# Patient Record
Sex: Female | Born: 1955 | Race: White | Hispanic: No | Marital: Married | Smoking: Former smoker
Health system: Southern US, Community
[De-identification: ages and names within clinical notes are randomized; demographics above are authoritative.]

## PROBLEM LIST (undated history)

## (undated) DIAGNOSIS — I340 Nonrheumatic mitral (valve) insufficiency: Secondary | ICD-10-CM

## (undated) DIAGNOSIS — Z9289 Personal history of other medical treatment: Secondary | ICD-10-CM

## (undated) DIAGNOSIS — M1990 Unspecified osteoarthritis, unspecified site: Secondary | ICD-10-CM

## (undated) DIAGNOSIS — R51 Headache: Secondary | ICD-10-CM

## (undated) DIAGNOSIS — K7460 Unspecified cirrhosis of liver: Secondary | ICD-10-CM

## (undated) DIAGNOSIS — E785 Hyperlipidemia, unspecified: Secondary | ICD-10-CM

## (undated) DIAGNOSIS — I10 Essential (primary) hypertension: Secondary | ICD-10-CM

## (undated) DIAGNOSIS — J069 Acute upper respiratory infection, unspecified: Secondary | ICD-10-CM

## (undated) DIAGNOSIS — F419 Anxiety disorder, unspecified: Secondary | ICD-10-CM

## (undated) DIAGNOSIS — I5032 Chronic diastolic (congestive) heart failure: Secondary | ICD-10-CM

## (undated) DIAGNOSIS — I499 Cardiac arrhythmia, unspecified: Secondary | ICD-10-CM

## (undated) HISTORY — PX: KNEE ARTHROSCOPY: SUR90

## (undated) HISTORY — PX: EYE SURGERY: SHX253

## (undated) HISTORY — DX: Hyperlipidemia, unspecified: E78.5

## (undated) HISTORY — PX: TMJ ARTHROPLASTY: SHX1066

## (undated) HISTORY — PX: COCCYX REMOVAL: SHX600

## (undated) HISTORY — PX: ABDOMINAL HYSTERECTOMY: SHX81

## (undated) HISTORY — PX: CHOLECYSTECTOMY: SHX55

## (undated) HISTORY — PX: HEEL SPUR SURGERY: SHX665

---

## 1999-03-25 ENCOUNTER — Other Ambulatory Visit: Admission: RE | Admit: 1999-03-25 | Discharge: 1999-03-25 | Payer: Self-pay | Admitting: Obstetrics and Gynecology

## 2000-02-26 ENCOUNTER — Encounter: Payer: Self-pay | Admitting: Orthopedic Surgery

## 2000-02-26 ENCOUNTER — Encounter: Admission: RE | Admit: 2000-02-26 | Discharge: 2000-02-26 | Payer: Self-pay | Admitting: Orthopedic Surgery

## 2000-04-02 ENCOUNTER — Ambulatory Visit (HOSPITAL_COMMUNITY): Admission: RE | Admit: 2000-04-02 | Discharge: 2000-04-02 | Payer: Self-pay | Admitting: Neurosurgery

## 2000-04-02 ENCOUNTER — Encounter: Payer: Self-pay | Admitting: Neurosurgery

## 2000-08-13 ENCOUNTER — Encounter: Payer: Self-pay | Admitting: Orthopedic Surgery

## 2000-08-13 ENCOUNTER — Encounter: Admission: RE | Admit: 2000-08-13 | Discharge: 2000-08-13 | Payer: Self-pay | Admitting: Orthopedic Surgery

## 2000-08-26 ENCOUNTER — Encounter: Payer: Self-pay | Admitting: Orthopedic Surgery

## 2000-08-26 ENCOUNTER — Ambulatory Visit (HOSPITAL_COMMUNITY): Admission: RE | Admit: 2000-08-26 | Discharge: 2000-08-26 | Payer: Self-pay | Admitting: Orthopedic Surgery

## 2000-09-09 ENCOUNTER — Encounter: Payer: Self-pay | Admitting: Orthopedic Surgery

## 2000-09-09 ENCOUNTER — Ambulatory Visit (HOSPITAL_COMMUNITY): Admission: RE | Admit: 2000-09-09 | Discharge: 2000-09-09 | Payer: Self-pay | Admitting: Orthopedic Surgery

## 2000-09-23 ENCOUNTER — Ambulatory Visit (HOSPITAL_COMMUNITY): Admission: RE | Admit: 2000-09-23 | Discharge: 2000-09-23 | Payer: Self-pay | Admitting: Orthopedic Surgery

## 2000-09-23 ENCOUNTER — Encounter: Payer: Self-pay | Admitting: Orthopedic Surgery

## 2000-11-02 ENCOUNTER — Encounter: Payer: Self-pay | Admitting: Neurosurgery

## 2000-11-02 ENCOUNTER — Encounter: Admission: RE | Admit: 2000-11-02 | Discharge: 2000-11-02 | Payer: Self-pay | Admitting: Neurosurgery

## 2000-11-18 ENCOUNTER — Encounter: Payer: Self-pay | Admitting: Neurosurgery

## 2000-11-18 ENCOUNTER — Ambulatory Visit (HOSPITAL_COMMUNITY): Admission: RE | Admit: 2000-11-18 | Discharge: 2000-11-18 | Payer: Self-pay | Admitting: Neurosurgery

## 2000-12-02 ENCOUNTER — Encounter: Payer: Self-pay | Admitting: Obstetrics and Gynecology

## 2000-12-02 ENCOUNTER — Ambulatory Visit (HOSPITAL_COMMUNITY): Admission: RE | Admit: 2000-12-02 | Discharge: 2000-12-02 | Payer: Self-pay | Admitting: Obstetrics and Gynecology

## 2000-12-22 ENCOUNTER — Encounter: Admission: RE | Admit: 2000-12-22 | Discharge: 2000-12-22 | Payer: Self-pay | Admitting: Obstetrics and Gynecology

## 2000-12-22 ENCOUNTER — Encounter: Payer: Self-pay | Admitting: Obstetrics and Gynecology

## 2001-01-10 ENCOUNTER — Ambulatory Visit (HOSPITAL_COMMUNITY): Admission: RE | Admit: 2001-01-10 | Discharge: 2001-01-10 | Payer: Self-pay | Admitting: Obstetrics and Gynecology

## 2001-01-10 ENCOUNTER — Encounter: Payer: Self-pay | Admitting: Obstetrics and Gynecology

## 2001-01-11 ENCOUNTER — Other Ambulatory Visit: Admission: RE | Admit: 2001-01-11 | Discharge: 2001-01-11 | Payer: Self-pay | Admitting: Obstetrics and Gynecology

## 2001-11-25 ENCOUNTER — Encounter: Admission: RE | Admit: 2001-11-25 | Discharge: 2001-11-25 | Payer: Self-pay | Admitting: Specialist

## 2001-11-25 ENCOUNTER — Encounter: Payer: Self-pay | Admitting: Specialist

## 2001-12-01 ENCOUNTER — Encounter: Payer: Self-pay | Admitting: Obstetrics and Gynecology

## 2001-12-01 ENCOUNTER — Ambulatory Visit (HOSPITAL_COMMUNITY): Admission: RE | Admit: 2001-12-01 | Discharge: 2001-12-01 | Payer: Self-pay | Admitting: Obstetrics and Gynecology

## 2001-12-14 ENCOUNTER — Encounter: Payer: Self-pay | Admitting: Specialist

## 2001-12-20 ENCOUNTER — Inpatient Hospital Stay (HOSPITAL_COMMUNITY): Admission: RE | Admit: 2001-12-20 | Discharge: 2001-12-21 | Payer: Self-pay | Admitting: Specialist

## 2001-12-20 ENCOUNTER — Encounter (INDEPENDENT_AMBULATORY_CARE_PROVIDER_SITE_OTHER): Payer: Self-pay

## 2002-11-21 ENCOUNTER — Ambulatory Visit (HOSPITAL_COMMUNITY): Admission: RE | Admit: 2002-11-21 | Discharge: 2002-11-21 | Payer: Self-pay | Admitting: Internal Medicine

## 2002-11-21 ENCOUNTER — Encounter: Payer: Self-pay | Admitting: Internal Medicine

## 2003-04-10 ENCOUNTER — Ambulatory Visit (HOSPITAL_COMMUNITY): Admission: RE | Admit: 2003-04-10 | Discharge: 2003-04-10 | Payer: Self-pay | Admitting: Internal Medicine

## 2003-04-10 ENCOUNTER — Encounter: Payer: Self-pay | Admitting: Internal Medicine

## 2003-05-17 ENCOUNTER — Encounter
Admission: RE | Admit: 2003-05-17 | Discharge: 2003-08-15 | Payer: Self-pay | Admitting: Physical Medicine & Rehabilitation

## 2003-08-14 ENCOUNTER — Encounter
Admission: RE | Admit: 2003-08-14 | Discharge: 2003-11-12 | Payer: Self-pay | Admitting: Physical Medicine & Rehabilitation

## 2003-10-05 ENCOUNTER — Emergency Department (HOSPITAL_COMMUNITY): Admission: EM | Admit: 2003-10-05 | Discharge: 2003-10-05 | Payer: Self-pay | Admitting: Family Medicine

## 2003-11-28 ENCOUNTER — Encounter
Admission: RE | Admit: 2003-11-28 | Discharge: 2004-02-26 | Payer: Self-pay | Admitting: Physical Medicine & Rehabilitation

## 2003-11-29 ENCOUNTER — Ambulatory Visit (HOSPITAL_COMMUNITY): Admission: RE | Admit: 2003-11-29 | Discharge: 2003-11-29 | Payer: Self-pay | Admitting: Family Medicine

## 2003-12-04 ENCOUNTER — Ambulatory Visit (HOSPITAL_COMMUNITY)
Admission: RE | Admit: 2003-12-04 | Discharge: 2003-12-04 | Payer: Self-pay | Admitting: Physical Medicine & Rehabilitation

## 2009-04-05 ENCOUNTER — Inpatient Hospital Stay: Payer: Self-pay | Admitting: Internal Medicine

## 2009-04-28 ENCOUNTER — Emergency Department (HOSPITAL_COMMUNITY): Admission: EM | Admit: 2009-04-28 | Discharge: 2009-04-28 | Payer: Self-pay | Admitting: Family Medicine

## 2009-07-25 ENCOUNTER — Emergency Department (HOSPITAL_COMMUNITY): Admission: EM | Admit: 2009-07-25 | Discharge: 2009-07-25 | Payer: Self-pay | Admitting: Family Medicine

## 2009-12-11 ENCOUNTER — Ambulatory Visit: Payer: Self-pay | Admitting: Anesthesiology

## 2010-06-03 ENCOUNTER — Inpatient Hospital Stay (HOSPITAL_COMMUNITY)
Admission: EM | Admit: 2010-06-03 | Discharge: 2010-06-07 | Payer: Self-pay | Source: Home / Self Care | Admitting: Emergency Medicine

## 2010-06-06 ENCOUNTER — Encounter (INDEPENDENT_AMBULATORY_CARE_PROVIDER_SITE_OTHER): Payer: Self-pay

## 2010-07-06 HISTORY — PX: JOINT REPLACEMENT: SHX530

## 2010-07-27 ENCOUNTER — Encounter: Payer: Self-pay | Admitting: Physical Medicine & Rehabilitation

## 2010-09-16 LAB — URINE CULTURE: Culture: NO GROWTH

## 2010-09-16 LAB — URINALYSIS, ROUTINE W REFLEX MICROSCOPIC
Bilirubin Urine: NEGATIVE
Ketones, ur: 15 mg/dL — AB
Nitrite: NEGATIVE
Protein, ur: NEGATIVE mg/dL
Specific Gravity, Urine: >1.03 — ABNORMAL HIGH (ref 1.005–1.030)
Urobilinogen, UA: 0.2 mg/dL (ref 0.0–1.0)

## 2010-09-16 LAB — CBC
HCT: 44.3 % (ref 36.0–46.0)
Hemoglobin: 12.2 g/dL (ref 12.0–15.0)
Hemoglobin: 14.4 g/dL (ref 12.0–15.0)
MCH: 32.5 pg (ref 26.0–34.0)
MCV: 101.9 fL — ABNORMAL HIGH (ref 78.0–100.0)
RBC: 3.75 MIL/uL — ABNORMAL LOW (ref 3.87–5.11)
RBC: 4.36 MIL/uL (ref 3.87–5.11)
WBC: 10.4 10*3/uL (ref 4.0–10.5)

## 2010-09-16 LAB — COMPREHENSIVE METABOLIC PANEL
ALT: 17 U/L (ref 0–35)
AST: 22 U/L (ref 0–37)
AST: 25 U/L (ref 0–37)
Alkaline Phosphatase: 106 U/L (ref 39–117)
Alkaline Phosphatase: 133 U/L — ABNORMAL HIGH (ref 39–117)
CO2: 28 mEq/L (ref 19–32)
CO2: 23 mEq/L (ref 19–32)
Chloride: 109 mEq/L (ref 96–112)
Chloride: 106 mEq/L (ref 96–112)
Creatinine, Ser: 0.59 mg/dL (ref 0.4–1.2)
GFR calc Af Amer: >60 mL/min (ref >60)
GFR calc Af Amer: >60 mL/min (ref >60)
GFR calc non Af Amer: >60 mL/min (ref >60)
GFR calc non Af Amer: >60 mL/min (ref >60)
Glucose, Bld: 84 mg/dL (ref 70–99)
Potassium: 3.9 mEq/L (ref 3.5–5.1)
Potassium: 4.4 mEq/L (ref 3.5–5.1)
Sodium: 137 mEq/L (ref 135–145)
Total Bilirubin: 0.4 mg/dL (ref 0.3–1.2)

## 2010-09-16 LAB — DIFFERENTIAL
Basophils Relative: 0 % (ref 0–1)
Eosinophils Absolute: 0.1 10*3/uL (ref 0.0–0.7)
Eosinophils Relative: 1 % (ref 0–5)
Lymphs Abs: 2.7 10*3/uL (ref 0.7–4.0)
Neutrophils Relative %: 65 % (ref 43–77)

## 2010-09-16 LAB — LIPASE, BLOOD: Lipase: 34 U/L (ref 11–59)

## 2010-11-21 NOTE — H&P (Signed)
[facility]  Patient:    Leah Shah, Leah Shah Visit Number: [id number] MRN: [id number]          Service Type: Attending:  Kerrin Champagne, M.D. Dictated by:   Sammuel Cooper. Mahar, P.A. Adm. Date:  12/20/01                           History and Physical  DATE OF BIRTH:  Nov 17, 1955  CHIEF COMPLAINT:  Coccyx pain.  HISTORY OF PRESENT ILLNESS:  The patient is a 55 year old female with a distant history of coccyx fracture.  She has developed arthritis in that area per MRI.  She has been having continued pain and dysfunction secondary to this arthritis.  She has failed conservative management.  The risks and benefits of the proposed surgery were discussed with the patient by Dr. Vira Browns secondary to it interfering with her activities of daily living, quality of life, and failing conservative management.  It was thought this would be her best course of management.  She indicated understanding and wished to proceed.  ALLERGIES:  CODEINE causes a rash.  MEDICATIONS: 1. Trazodone 150 mg p.o. q.h.s. 2. Atenolol 100 mg p.o. q.h.s. 3. Hydroxyzine 25 mg p.o. qhs 4. Darvocet q.4h. p.r.n.  PAST MEDICAL HISTORY: 1. Migraines. 2. Anxiety.  PAST SURGICAL HISTORY: 1. Partial hysterectomy in 1991. 2. TMJ surgery in 1994, 1995, and 1996.  SOCIAL HISTORY:  The patient is married.  Unemployed.  She smokes two packs of cigarettes per day.  She denies alcohol use.  She will have her husband and 15 year old son to help her through her postoperative course.  FAMILY HISTORY:  Father alive at age 27, with rheumatoid arthritis and emphysema.  Mother alive; however, her history is unknown.  The patient thinks her mom may have "heart problems."  REVIEW OF SYSTEMS:  The patient denies any fevers, chills, sweats, or bleeding tendencies.  CNS:  Denies any blurred vision, double vision, headaches, seizure, or paralysis.  CARDIOVASCULAR:  Denies any chest pain,  angina, orthopnea, claudication, or palpitations.  PULMONARY:  Denies any shortness of breath, productive cough, or hemoptysis.  GASTROINTESTINAL:  Denies any nausea, vomiting, constipation, diarrhea, melena, or bloody stool. GENITOURINARY:  Denies any dysuria, hematuria, or discharge.  MUSCULOSKELETAL: Positive paresthesias to bilateral lower extremities.  No numbness and no paralysis.  PHYSICAL EXAMINATION:  VITAL SIGNS:  Blood pressure 108/66, respirations 16 and unlabored, pulse 64 and regular.  GENERAL:  The patient is a 55 year old white female who is alert and oriented, in no acute distress.  Well-nourished, well-groomed, appears her stated age. Is sitting rather uncomfortably on a chair in the examination room.  HEENT:  Head normocephalic, atraumatic.  Pupils equal, round, and reactive to light.  Extraocular movements intact.  Nares patent.  Pharynx is clear.  NECK:  Soft to palpation.  No bruits appreciated.  No lymphadenopathy noted. No thyromegaly noted.  CHEST:  Clear to auscultation.  No rales, rhonchi, stridor, wheezes, or friction rubs.  BREASTS:  Not pertinent, not performed.  HEART:  Distant heart sounds.  S1, S2.  Regular rate and rhythm.  No murmurs, gallops, or rubs noted.  ABDOMEN:  Obese.  Positive bowel sounds.  Nontender, nondistended.  No organomegaly noted.  GENITOURINARY:  Not pertinent, not performed.  EXTREMITIES:  Sensation is intact grossly to light touch.  Motor function is intact throughout bilateral lower extremities.  Posterior tibialis pulses are intact and equal bilaterally.  Per Dr. Rogelia Mire  note, heel- and toe-walking demonstrate weakness.  Reflexes at the knee are 2+ and symmetric, ankle at 2+ and symmetric.  Motor shows no deficit.  Tender along the region of the lower sacrum and coccyx and along the left side of her sacral region as well.  SKIN:  Intact.  Without any lesions or rashes.  LABORATORY DATA:  X-rays show an angulated  coccyx at C2-3 level.  There is an almost 90-degree angle present.  Radiographs obtained suggest that there is some increased bone formation at the level of the sacrococcygeal junction with a transverse area of increased bone density.  IMPRESSION:  Sacrococcygeal arthritis.  PLAN: 1. Admit to [facility] for coccygealectomy at the sacrococcygeal    junction on December 20, 2001, to be done by Dr. Vira Browns. 2. The patients gynecologist is Dr. Huel Cote, and she has sent [place] a    note saying that from her standpoint the patient is stable and able to    proceed with the surgery. 3. The patients primary care physician is Dr. Dewain Penning. Dictated by:   Sammuel Cooper. Mahar, P.A. Attending:  Kerrin Champagne, M.D. DD:  12/14/01 TD:  12/16/01 Job: [id number]

## 2010-11-21 NOTE — Procedures (Signed)
NAME:  Leah Shah, Leah Shah                           ACCOUNT NO.:  [id number]   MEDICAL RECORD NO.:  [id number]                   PATIENT TYPE:  REC   LOCATION:  [facility]                                  FACILITY:  [facility]   PHYSICIAN:  Erick Colace, M.D.           DATE OF BIRTH:  [birth year]   DATE OF PROCEDURE:  DATE OF DISCHARGE:                                 OPERATIVE REPORT   HISTORY OF PRESENT ILLNESS:  [name] has had bilateral __________ joint  injections August 23, 2003 without significant improvement.   INDICATIONS FOR PROCEDURE:  Axial as well as left lower extremity pain going  down as far as the foot.   PROCEDURE:  Left L1 transforaminal epidural steroid injection.   DESCRIPTION OF PROCEDURE:  Informed consent was obtained after describing  the risks and benefits of the procedure with the patient. These risks  included paralysis, loss of bowel and bladder function, bruising, bleeding  and infection. The patient wishes to proceed and has given written consent.  The patient placed prone on the fluoroscopy table with Betadine prep and  sterile drape. Skin and subcutaneous tissues were anesthetized with 3 cc of  1% Lidocaine with a 25 gauge 1and 1/4 inch needle. A 22 gauge 3.5 inch  spinal needle was manipulated in his left S1 foramen under fluoroscopic  guidance and confirmed with Omnipaque 180 x 1/2 cc under fluoroscopy, which  demonstrated no inter-  vascular uptake and appropriate epidural spread. The patient tolerated the  procedure well. Please refer to procedure record for pre and post vital  signs, which did remain stable. Post injection instructions given. She is to  followup in 3 weeks.                                                Erick Colace, M.D.    AEK/MEDQ  D:  09/13/2003 17:16:01  T:  09/13/2003 04:54:09  Job:  [id number]

## 2010-11-21 NOTE — Assessment & Plan Note (Signed)
REASON FOR VISIT:  A 55 year old female referred by [facility] for low back  pain, buttock pain, as well as bilateral leg pain.  The last time I saw her  was for an EMG done on July 19, 2003 to evaluate paresthesias and  dysesthesias in the lower extremities.  This was negative.  We ordered sed  rate, ANA, and rheumatoid factor because of complaints of painful swelling  in the legs, particularly on the left side.  These were normal.   She has been on Darvocet-N 100 one p.o. q.i.d. providing some pain relief  for her.  She is also on trazodone 150 q.h.s., atenolol 100 mg p.o. daily,  and hydroxyzine 25 p.o. q.h.s.   FAMILY HISTORY:  Significant for rheumatoid arthritis.   INTERVAL HISTORY:  Pain left shoulder about 3 weeks, no trauma.  She has a  skin area over that region.  No previous injury or surgeries.  She has some  neck pain but this is fairly minor at this point.  Her pain is graded at  9/10, ranging from 8-10.  Pain diagram showing left shoulder pain, low back  pain, bilateral lower extremity pain mainly anterior.   ALLERGIES:  CODEINE.   REVIEW OF SYSTEMS:  Swelling in the legs, abnormal heart rhythm, depression,  weight gain, swelling.   EXAMINATION:  Blood pressure 128/77, pulse 62, respirations 16, O2  saturation 98% in room air.  Gait is normal.  Affect is alert, appearance  normal.   Sensation reduced in the left S1 dermatome but more generally over the  entire right side compared to the left side.  She has no evidence of facial  droop.  She has normal motor strength although somewhat of a give-way type  weakness.  Examination of bilateral upper and lower extremities graded at  least at 4/5.  She has normal neck range of motion.  Her back range of  motion approximately forward flexion 50%, extension 505, lateral rotation  and bending 50%.  Faber's test is positive SI joint area bilaterally.  She  has normal deep tendon reflexes with the exception of left S1.   Skin shows one papule left deltoid area.  No pus, no induration.   Range of motion bilateral upper extremities is full.  However, internal  rotation of the left side at the shoulders is mildly painful.   IMPRESSION:  1. Chronic low back pain, mainly axial, although she does have some lower     extremity pain as well, normal EMG.  Exam suggests sacroiliac joint     involvement.  2. Question left S1 dermatome radicular symptoms.  3. Cervicalgia, mild.  4. Left shoulder pain, likely rotator cuff tendonitis.   PLAN:  1. Will set up for bilateral SI joint injections.  2. Continue Darvocet.  3. Consider S1 selective nerve root injection if the SI joint injection is     negative.      Erick Colace, M.D.   AEK/MedQ  D:  08/16/2003 13:44:40  T:  08/16/2003 15:29:33  Job #:  [id number]   cc:   Kerrin Champagne, M.D.  [address]  [place]  [place] [address]  Fax: [phone]   Dr. Margarette Canada at [facility]

## 2010-11-21 NOTE — Procedures (Signed)
NAME:  Leah Shah, Leah Shah                           ACCOUNT NO.:  [id number]   MEDICAL RECORD NO.:  [id number]                   PATIENT TYPE:  REC   LOCATION:  [facility]                                  FACILITY:  [facility]   PHYSICIAN:  Erick Colace, M.D.           DATE OF BIRTH:  1956-05-27   DATE OF PROCEDURE:  08/23/2003  DATE OF DISCHARGE:                                 OPERATIVE REPORT   PROCEDURE:  Bilateral sacroiliac joint injections.   INDICATIONS FOR PROCEDURE:  Axial back pain and buttock pain, positive  Fabre's test bilaterally in sacroiliac joint.  The patient has had 8 out of  10 despite Darvocet.   DESCRIPTION OF PROCEDURE:  Informed consent was obtained after describing  the risks and benefits of the procedure to the patient.  These risks  included paralysis, loss of bowel and bladder function, bruising, bleeding  and infection.  The patient wishes to proceed.   The patient was placed prone on the fluoroscopy table with Betadine prep and  sterile drape.  The skin and subcutaneous tissues were anesthetized with 2  mL of 1% lidocaine on each side with a 25 gauge 1 1/4 needle.  A 22 gauge,  three and a half inch, spinal needle was manipulated into the left  sacroiliac joint under fluoroscopic guidance and confirmed with Omnipaque  180 times one half mL under live fluoroscopy demonstrating no intravascular  uptake and appropriate interarticular spread.  Then the same procedure was  repeated exactly on the right side.  In each case, 1.5 mL of 1% lidocaine  methylparaben free were injected as a diagnostic block.  The patient  tolerated the procedure well.  Post-injection pain score was 6 out of 10.   The patient will return in three weeks and if she has a positive diagnostic  block would then add corticosteroid.  If negative, then consider S1  selective nerve root injection or transforaminal epidural steroid injection  left versus bilateral.                      Erick Colace, M.D.    AEK/MEDQ  D:  08/23/2003 15:40:40  T:  08/23/2003 16:06:15  Job:  [id number]   cc:   DR, Margarette Canada  [facility]   Kerrin Champagne, M.D.  [address]  [place]  [place] [address]  Fax: [phone]

## 2010-11-21 NOTE — Assessment & Plan Note (Signed)
MEDICAL RECORD NUMBER:  [id number]   DATE OF BIRTH:  09-Jul-1955   HISTORY:  Leah Shah returns today after I last saw her on Nov 29, 2003.  She  has a history of chronic low back pain since year 2000 since coccygeal  removal per Dr. Vassie Moselle.  She has had EMG, NCV at this office showing no  evidence of peripheral neuropathy or radiculopathy.  She has had right S1  selective nerve root injection without any particular improvement in her  pain.  She had bilateral sacroiliac joint injections which were done on  August 23, 2003 which did not result in significant pain relief.  Most  recently she had a fall on a wooden floor and landed on her right knee.  She  had mild to moderate medial greater lateral joint line tenderness at that  time and ability to flex the knee actually to 45 degrees, no popping or  clicking.  She was sent for an x-ray of her right knee and this showed  essentially mild DJD, mild medial compartmental narrowing, patella looked  okay.  She was also sent for a CT of the lumbar spine given that she had  subjective lower extremity weakness.  There was no evidence of a herniated  disk, no foraminal or central canal stenosis from T12 though S1, just some  minimal bilateral facet degenerative changes L4-5, L5-S1.   MEDICATIONS:  1. Darvocet N 100 1 p.o. q.i.d. which helps her back pain.  2. Trazodone 150 q.h.s.  3. Atenolol 100 mg p.o. daily.  4. Hydroxizine 25 q.h.s.  5. Benicar 40 p.o. daily.  6. Flexeril 5 t.i.d.   REVIEW OF SYSTEMS:  Positive for swelling in the right foot.  Some  depression, anxiety, weakness, numbness, headaches.  Frequent urination.  She has stress incontinence symptoms.  Weight gain.   EXAMINATION:  Blood pressure 102/62, pulse 66, O2 saturation 95% in room  air.   She has reduced right L5-S1 dermatome to light touch.  She has full hip  range of motion without pain.  She has no tenderness over the greater  trochanters, does have tenderness  at the lumbosacral junction but not over  PSIS area.  She has right prepatellar bursitis with some ecchymosis however  at that area.  She has no medial lateral joint line tenderness, no valgus or  varus laxity at the knee.  Lachman's could not be done secondary to pain  over the knee.  She does have swelling at the right ankle but no pain with  range of motion, and no evidence of ecchymosis.  No point tenderness in the  foot or ankle region.   IMPRESSION:  1. Right prepatellar bursitis status post fall.  Question if she might have     some meniscal injury.  2. Lumbar facet arthropathy, chronic low back pain. She has no evidence of     stenosis or other neural compressive lesion.   RECOMMENDATIONS:  1. Will have the patient follow up with Dr. Madelon Lips, he has seen her in the     past in regards to her right knee pain.  2. Will start her on Naprosyn 500 b.i.d. x1 month.  3. Continue Darvocet N 100 1 p.o. q.i.d., I think she can be on this     chronically.  4. I do not think any further spine interventional procedures are needed at     this time so that I will see her on a p.r.n. basis and Dr.  Collins Scotland can     continue the Darvocet N 100.      Erick Colace, M.D.   AEK/MedQ  D:  12/13/2003 16:40:20  T:  12/13/2003 20:11:03  Job #:  [id number]   cc:   Tammy R. Collins [place], M.D.  [address]  [place]  [place] [address]  Fax: [phone]   Thera Flake., M.D.  [address] [place]  [place] [address]  Fax: [phone]

## 2010-11-21 NOTE — Assessment & Plan Note (Signed)
MEDICAL RECORD NUMBER:  [id number].   [name] returns today after I last saw her on September 13, 2003. At that  time, I did a left SI transforaminal epidural steroid injection which helped  her back pain actually more than her left lower extremity pain. The duration  of action was approximately three weeks. Her prior injection was bilateral  sacroiliac joint injection done for axial back pain, and this is diagnostic  of lidocaine only and did not provide any pain relief.   In the interval time, yesterday she fell at home on a wooden floor, tried to  grab onto a chair as well as vacuum cleaner fell onto her right knee, has  mild abrasion right lower anterior leg but has a lot of pain with attempted  knee flexion. She is able to walk on it but keeps her knee straight.   Prior workup includes sed rate, INR, rheumatoid factor because of complaints  of painful swelling. There were normal.   MEDICATIONS:  1. Darvocet-N 100 one p.o. q.i.d.  2. Trazodone 150 mg q.h.s.  3. Atenolol 100 p.o. q.d.  4. Hydroxyzine 25 q.h.s.  5. Just started on Benicar 40 mg p.o. q.d.  6. Also was started on Flexeril 5 t.i.d. since last visit.   ALLERGIES:  CODEINE.   REVIEW OF SYSTEMS:  Positive for abnormal heart rhythm, blurred vision,  anxiety, depression, headaches, weakness, swelling, as well as frequent  urination and occasion incontinence. The incontinence started the end of  March.   PHYSICAL EXAMINATION:  VITAL SIGNS:  Blood pressure 122/81, pulse oximetry  62, O2 saturation 97% on room air.  GENERAL:  No acute distress. Mood and affect appropriate.  EXTREMITIES:  Right knee has some swelling in the prepatellar area,  significant tenderness with palpation around the patella, would not allow me  to do deeper palpation due to pain. She has no ecchymosis over that area. No  significant knee effusion intra-particularly. Mild to moderate medial  greater than lateral joint line tenderness. She is able to  flex the knee  actively to 45 degrees. No clicking/popping heard. She has good range of  motion at the ankle without pain. She has some mild tenderness to palpation  over the anterior leg near the contused and abraded area. Left knee:  No  pain. No pain with ankle range of motion.  BACK:  Mild tenderness to palpation of the lumbar spine. Sensation is normal  to pinprick and light touch bilaterally.   IMPRESSION:  1. Right knee contusion, rule out fracture.  2. History of lumbar radiculopathy and lumbar pain.  3. Lower extremity weakness, subjective, difficulty to test secondary to     pain today. Will rule out any type of lumbar compressive lesion. Prior     MRI done approximately three years ago showing no disk herniation. We     will just get a CT of the lumbar spine. Further history on urinary     symptoms sound more like stress incontinence.   PLAN:  1. I will see her back in two to three weeks, check a knee x-ray as well. If     positive, refer to Dr. Madelon Lips, has seen in     the past before.  2. Consider bilateral S1 transforaminal epidural injections depending on     results of the CT lumbar spine.      Leah Shah, M.D.   AEK/MedQ  D:  11/29/2003 54:09:81  T:  11/29/2003 15:23:31  Job #:  [id number]   cc:   Marchelle Gearing., M.D.  [address] [place]  [place] [address]  Fax: [phone]

## 2010-11-21 NOTE — Group Therapy Note (Signed)
HISTORY OF PRESENT ILLNESS:  This is a 55 year old female referred by [facility] and after she was first referred to Dr. Vear Clock' office because her  funding source was not able to make an appointment with him.  The patient  gives a history of falling at work in the year 2000, slipped and landing on  her buttocks.  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  She had coccyx  pain since that time and first underwent conservative treatment but  eventually in June 2003 had coccygeal removal per Dr. Otelia Sergeant.  Since that  time, she has had some improvement in her buttocks pain but continues to  have low back pain.  She had some physical therapy prior to her surgery in  June 2003, has not had any since that time.  Has at an intervening time  developed left greater than right knee pain.  Has some office notes from  [facility] indicating negative knee x-rays.  A note that they were going  to check sed-rate and rheumatoid factor.  Per patient, states that  rheumatoid factor was normal, but there may have been some elevation of sed-  rate.  She has been taking Darvocet N-100 one p.o. q.i.d. per Dr. Margarette Canada.  She states that this help relieves her pain.  She does not have films  available for my review.  However, from each chart, there was a foot x-ray  on the right with mild bunion formation versus metatarsal, minimal spurring  lateral aspect of the calcaneus.  She had two views of the knee, left side  showing no joint space narrowing.  There is no evidence of effusion on that  exam.  Chest x-ray showed some COPD changes December 14, 2001.  Transvaginal  ultrasound, small right ovarian cyst and two left simple ovarian cysts.  CT  of the pelvis Nov 25, 2001, showed no fracture of the sacrum or pelvis.  Multi-plantar reconstruction was technically limited.  Once again, no  fractures identified.  MRI of the lumbar spine dated Nov 18, 2000, per Dr.  Franky Macho, no disk herniations.  MR April 2002, showed  mild disk space  narrowing at L3-4, L4-5.  Epidurography for left sided low back pain during  injection of 120 mg of Depo-Medrol recorded October 09, 2000, and similar  procedure but at right L5-S1 interlaminar, but using 40 mg of Kenalog  performed on September 09, 2000, and L5-S1 epidural, 20 mg Depo-Medrol utilized  August 26, 2000.  Bone scan done August 13, 2000, no pathologic uptake.  C-spine April 02, 2000, normal study.  CT cervical spine was normal.  Lumbar spine, two views, February 26, 2000, degenerative changes at facet  joints, L4-5 on the left and bilateral L5-S1.  The same facet changes noted  on MRI date February 26, 2000.   PAST MEDICAL HISTORY:  Significant for migraine anxiety.   PAST SURGICAL HISTORY:  1. Partial hysterectomy in 1991.  2. TMJ surgery 1994, 1995, 1996.   SOCIAL HISTORY:  Married, lives with his spouse, smokes one and a half packs  a day times 31 years.  Recently went on Social Security disability and is  now on [employer].  Now in addition to her Medicaid   CURRENT MEDICATIONS:  1. Darvocet N-100 one p.o. q.i.d.  2. Atenolol 100 mg p.o. daily.  3. Trazodone 150 daily.  4. Hydroxizine 25 mg p.o. daily.   FAMILY HISTORY:  Significant for rheumatoid arthritis.  States that she has  been negative  for this.  Also, family history of heart disease, lung  disease, diabetes and high blood pressure.   PHYSICAL EXAMINATION:  VITAL SIGNS:  Blood pressure 134/74, pulse 88,  respirations 12, O2 saturations 96% on room air.  GENERAL APPEARANCE:  No acute distress.  Mood and affect appropriate.  No  lability or agitation. Sensation decreased to pinprick left L3 and left S1,  decreased right L4 to monofilm and decrease left S1.  Deep tendon reflexes  are normal bilateral upper and lower extremities.  NECK:  No pain with range of motion.  EXTREMITIES:  Upper extremities have normal strength and range of motion.  Lower extremities have normal strength and range of  motion.  Good pulses.  Bilateral lower extremities have left knee effusion.  Mild.  BACK:  __________ range for flexion/extension.  Pain with both end range  forward flexion and range extension.  SKIN:  No dermatomal rashes.  No other evidence of skin breakdown or  vascular changes in the upper or lower extremities.   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   RADIOLOGIC STUDIES:  Most significant for facet joint changes which can have  radiating pattern, but usually not down to feet.   PLAN:  1. Will check bilateral lower extremity EMG and CV's.  States that she has     not had one of these tests since 1999.  2. Consider facet joint injections versus lumbar medial branch blocks, and     if positive, consider radiofrequency.  3. Continue Darvocet for pain.  4. Would recommend physical therapy post injection.  Also, consider more     selective transforaminal epidural steroid injections which have not been     performed in the past.  Would target left S1 given sensory findings.   FOLLOWUP:  I will see her back for the EMG first.     Erick Colace, M.D.   AEK/MedQ  D:  06/19/2003 12:37:15  T:  06/19/2003 14:36:00  Job #:  [id number]   cc:   [place], M.D.  [facility]   Kerrin Champagne, M.D.  [address]  [place]  [place] [address]  Fax: [phone]

## 2010-11-21 NOTE — Op Note (Signed)
[facility]  Patient:    Leah Shah, Leah Shah Visit Number: [id number] MRN: [id number]          Service Type: SUR Location: 4W [id number] [id number] Attending Physician:  Lubertha South Dictated by:   Kerrin Champagne, M.D. Proc. Date: 12/20/01 Admit Date:  12/20/2001   CC:         Tammy R. Collins Scotland, M.D.  Alvino Chapel, M.D.  Thyra Breed, M.D.  Sharlot Gowda., M.D.   Operative Report  PREOPERATIVE DIAGNOSIS:  Coccyodynia.  POSTOPERATIVE DIAGNOSIS:  Coccyodynia.  OPERATION:  Coccygectomy.  SURGEON:  Burman Blacksmith, M.D.  ASSISTANT:  Della Goo, P.A.  ANESTHESIA:  GOT  ANESTHESIOLOGIST:  Lucille Passy, M.D.  ESTIMATED BLOOD LOSS: 5 cc.  DRAINS:  None.  BRIEF CLINICAL HISTORY:  A 55 year old female with long history of back pain, dating back to a full she had back in 2001.  She has complained of severe pain in her back, radiation to her legs as well as pain into the region of her buttocks and tail bone.  She has undergone extensive evaluation including MRI studies which have been unremarkable.  Bone scan unremarkable.  CT scan performed preoperatively demonstrated arthrosis changes at sacral coccygeal junction.  She has undergone extensive treatment including injections locally with only temporary relief of pain and has severe pain associated with the sacral coccygeal junction and the upper junction of the C1-C2 level.  She was referred for evaluation and treatment.  She is scheduled to undergo a coccygectomy after conservative measures appeared to have failed.  She has undergone anti-inflammatory agent use, steroid use, use of a donut on a long term basis, physical therapy, all without relief of pain.  INTRAOPERATIVE FINDINGS:  The patient was found to have a fused lower C2-C3 segment with significant angulation deformity present.  Arthrosis changes at the sacral coccygeal junction as well as at the C1-C2  level.  DESCRIPTION OF PROCEDURE:  After adequate general anesthesia, the patient in the knee chest position on Andrews frame, hip flexed 45 to 50 degrees.  Tape used to hold the buttocks spread to expose the gluteal cleft region.  This area well draped off.  Prepped with DuraPrep solution.  Draped in the usual manner.  A Vi-drape was used.  The incision to the left of the midline by approximately 1 to 2.5 cm, approximately 6 cm in length after infiltration with Marcaine 0.25% plain.  The incision carried sharply through the skin and subcutaneous layers down to the superficial aspect of the posterior aspect of the coccyx.  Incision carried down to subperiosteal region and subperiosteum dissection then carried both to the right and the left side of the midline exposing the posterior aspects of the coccyx.  Apparently this was the C2 level.  The upper aspect of the C2-C1 articulation was divided using electrocautery.  Subperiosteal dissection carried across both the lateral and the anterior aspect of the C2 level.  Towel clip then used to grasp the C2 body and then an Allis clamp used to further deliver this.  The periosteum then carefully resected off the anterior aspect of the C2 level, exposing this area nicely and then removing C2 and C3.  Small soft tissue attachments distally were then carefully divided.  This was then removed.  Examination of the next segment up demonstrated mobility of the C1 coccygeal segment and the subperiosteal dissection was then carried superiorly and then the sacral coccygeal junction incised using electrocautery.  Art therapist  then used to carefully perform subperiosteal dissection circumferentially along the left side of the C1 body and then this was carefully rotated posteriorly and attachments of the sacral coccygeal junction were divided laterally and anteriorly using both electrocautery and the key elevator.  The sacral coccygeal junction was carefully  divided again using electrocautery as well as key elevator and subperiosteal dissection carried anteriorly resecting the soft tissue off the anterior aspect of the body of the first coccygeal segment.  Care was taken to ensure that no appreciable depth was carried anterior to the coccyx with this removal.  The pelvic ligaments and their attachments in the coccyx were preserved nicely.  Careful inspection of these structures demonstrated no evidence of bowel penetration otherwise.  Irrigation was performed.  A Leksell rongeur was used to carefully smooth the caudal aspect of the sacrum.  No active bleeding was judged to be present.  The ligamentous structures and the periosteum were then approximated in the midline with interrupted #1 Vicryl sutures.  Care taken not to enter the deeper structures with this suture.  The deep and superficial fatty layers were then carefully approximated closing all dead space using #1 and 0 Vicryl sutures.  The more superficial layer with interrupted 2-0 Vicryl sutures and the skin closed with a running subcuticular stitch of 4-0 Vicryl. Tincture of Benzoin and Steri-Strips were then applied.  A small portion of Telfa and then a portion of Op-Site was applied after painting the skin with tincture of Benzoin.  The patient reactivated slightly early and with this did buck slightly, had a small amount of tear of the perianal skin, very superficial light area, more as a skin tear than any depth.  Could not suture this area as it was such a very tiny shallow area of tear.  This was left to heal by secondary intention.  The patient was then reactivated and returned to the recovery room in satisfactory condition.  Note:  The subcutaneous layers were infiltrated with Marcaine at the end of the procedure, 0.25% plain, a  total of 40 cc per used.  The coccygeal bone and segments were sent for pathology. Dictated by:   Kerrin Champagne, M.D. Attending Physician:  Lubertha South DD:  12/20/01 TD:  12/21/01 Job: [id number]

## 2011-05-06 ENCOUNTER — Other Ambulatory Visit: Payer: Self-pay | Admitting: Orthopedic Surgery

## 2011-05-06 NOTE — H&P (Signed)
  Dictated [phone]

## 2011-05-12 ENCOUNTER — Encounter (HOSPITAL_COMMUNITY): Payer: Self-pay | Admitting: Pharmacy Technician

## 2011-05-13 ENCOUNTER — Encounter (HOSPITAL_COMMUNITY)
Admission: RE | Admit: 2011-05-13 | Discharge: 2011-05-13 | Disposition: A | Discharge status: Home / Self Care | Payer: Medicare HMO | Source: Ambulatory Visit | Attending: Orthopedic Surgery | Admitting: Orthopedic Surgery

## 2011-05-13 ENCOUNTER — Encounter (HOSPITAL_COMMUNITY): Payer: Self-pay

## 2011-05-13 ENCOUNTER — Other Ambulatory Visit: Payer: Self-pay

## 2011-05-13 DIAGNOSIS — Z01812 Encounter for preprocedural laboratory examination: Secondary | ICD-10-CM | POA: Insufficient documentation

## 2011-05-13 DIAGNOSIS — Z01818 Encounter for other preprocedural examination: Secondary | ICD-10-CM | POA: Insufficient documentation

## 2011-05-13 HISTORY — DX: Essential (primary) hypertension: I10

## 2011-05-13 HISTORY — DX: Anxiety disorder, unspecified: F41.9

## 2011-05-13 HISTORY — DX: Unspecified osteoarthritis, unspecified site: M19.90

## 2011-05-13 HISTORY — DX: Headache: R51

## 2011-05-13 LAB — URINALYSIS, ROUTINE W REFLEX MICROSCOPIC
Glucose, UA: NEGATIVE mg/dL
Leukocytes, UA: NEGATIVE
Nitrite: NEGATIVE
Protein, ur: NEGATIVE mg/dL
Urobilinogen, UA: 0.2 mg/dL (ref 0.0–1.0)

## 2011-05-13 LAB — ABO/RH: ABO/RH(D): B POS

## 2011-05-13 LAB — SURGICAL PCR SCREEN
MRSA, PCR: NEGATIVE
Staphylococcus aureus: NEGATIVE

## 2011-05-13 LAB — BASIC METABOLIC PANEL
BUN: 8 mg/dL (ref 6–23)
CO2: 29 mEq/L (ref 19–32)
Chloride: 106 mEq/L (ref 96–112)
GFR calc Af Amer: >90 mL/min (ref >90)
Glucose, Bld: 81 mg/dL (ref 70–99)
Potassium: 5.1 mEq/L (ref 3.5–5.1)

## 2011-05-13 LAB — CBC
HCT: 41.9 % (ref 36.0–46.0)
Hemoglobin: 13.8 g/dL (ref 12.0–15.0)
MCH: 35.3 pg — ABNORMAL HIGH (ref 26.0–34.0)
MCHC: 32.9 g/dL (ref 30.0–36.0)

## 2011-05-13 LAB — DIFFERENTIAL
Basophils Relative: 0 % (ref 0–1)
Monocytes Absolute: 0.5 10*3/uL (ref 0.1–1.0)
Monocytes Relative: 5 % (ref 3–12)
Neutro Abs: 5.7 10*3/uL (ref 1.7–7.7)

## 2011-05-13 LAB — TYPE AND SCREEN: ABO/RH(D): B POS

## 2011-05-13 NOTE — Pre-Procedure Instructions (Signed)
20 EVONNA STOLTZ  05/13/2011   Your procedure is scheduled on:  05/19/2011  Report to [facility] at 5:30 AM.  Call this number if you have problems the morning of surgery: [phone]   Remember: Bring Incentive Spriometer back in suitcase day of surgery.   Do not eat food:After Midnight.  Do not drink clear liquids: 4 Hours before arrival.  Take these medicines the morning of surgery with A SIP OF WATER: Oxycodone   Do not wear jewelry, make-up or nail polish.  Do not wear lotions, powders, or perfumes. You may wear deodorant.  Do not shave 48 hours prior to surgery.  Do not bring valuables to the hospital.  Contacts, dentures or bridgework may not be worn into surgery.  Leave suitcase in the car. After surgery it may be brought to your room.  For patients admitted to the hospital, checkout time is 11:00 AM the day of discharge.   Patients discharged the day of surgery will not be allowed to drive home.  Name and phone number of your driver: patient being admitted.  Special Instructions: CHG Shower Use Special Wash: 1/2 bottle night before surgery and 1/2 bottle morning of surgery.   Please read over the following fact sheets that you were given: Pain Booklet, Coughing and Deep Breathing, Blood Transfusion Information, MRSA Information and Surgical Site Infection Prevention

## 2011-05-14 LAB — URINE CULTURE: Colony Count: 5000

## 2011-05-19 ENCOUNTER — Encounter (HOSPITAL_COMMUNITY): Admission: RE | Payer: Self-pay | Source: Ambulatory Visit

## 2011-05-19 ENCOUNTER — Inpatient Hospital Stay (HOSPITAL_COMMUNITY): Admission: RE | Admit: 2011-05-19 | Payer: Medicare HMO | Source: Ambulatory Visit | Admitting: Orthopedic Surgery

## 2011-05-19 SURGERY — ARTHROPLASTY, KNEE, TOTAL, USING IMAGELESS COMPUTER-ASSISTED NAVIGATION
Anesthesia: General | Site: Knee | Laterality: Right

## 2011-06-26 ENCOUNTER — Other Ambulatory Visit (HOSPITAL_COMMUNITY): Payer: Self-pay | Admitting: Orthopedic Surgery

## 2011-07-20 ENCOUNTER — Encounter (HOSPITAL_COMMUNITY): Payer: Self-pay

## 2011-07-20 ENCOUNTER — Encounter (HOSPITAL_COMMUNITY)
Admission: RE | Admit: 2011-07-20 | Discharge: 2011-07-20 | Disposition: A | Discharge status: Home / Self Care | Payer: Medicare HMO | Source: Ambulatory Visit | Attending: Orthopedic Surgery | Admitting: Orthopedic Surgery

## 2011-07-20 ENCOUNTER — Ambulatory Visit (HOSPITAL_COMMUNITY)
Admission: RE | Admit: 2011-07-20 | Discharge: 2011-07-20 | Disposition: A | Discharge status: Home / Self Care | Payer: Medicare HMO | Source: Ambulatory Visit | Attending: Orthopedic Surgery | Admitting: Orthopedic Surgery

## 2011-07-20 DIAGNOSIS — I517 Cardiomegaly: Secondary | ICD-10-CM | POA: Insufficient documentation

## 2011-07-20 DIAGNOSIS — F17200 Nicotine dependence, unspecified, uncomplicated: Secondary | ICD-10-CM | POA: Insufficient documentation

## 2011-07-20 DIAGNOSIS — Z01818 Encounter for other preprocedural examination: Secondary | ICD-10-CM | POA: Insufficient documentation

## 2011-07-20 DIAGNOSIS — I10 Essential (primary) hypertension: Secondary | ICD-10-CM | POA: Insufficient documentation

## 2011-07-20 DIAGNOSIS — Z01812 Encounter for preprocedural laboratory examination: Secondary | ICD-10-CM | POA: Insufficient documentation

## 2011-07-20 HISTORY — DX: Acute upper respiratory infection, unspecified: J06.9

## 2011-07-20 LAB — CBC
HCT: 40.6 % (ref 36.0–46.0)
Hemoglobin: 13.4 g/dL (ref 12.0–15.0)
MCV: 105.5 fL — ABNORMAL HIGH (ref 78.0–100.0)
RBC: 3.85 MIL/uL — ABNORMAL LOW (ref 3.87–5.11)
RDW: 14.1 % (ref 11.5–15.5)
WBC: 10.3 10*3/uL (ref 4.0–10.5)

## 2011-07-20 LAB — DIFFERENTIAL
Basophils Absolute: 0 10*3/uL (ref 0.0–0.1)
Eosinophils Relative: 2 % (ref 0–5)
Lymphocytes Relative: 35 % (ref 12–46)
Lymphs Abs: 3.6 10*3/uL (ref 0.7–4.0)
Monocytes Absolute: 0.7 10*3/uL (ref 0.1–1.0)
Neutro Abs: 5.7 10*3/uL (ref 1.7–7.7)

## 2011-07-20 LAB — URINE CULTURE
Colony Count: 55000
Culture  Setup Time: 201301141617

## 2011-07-20 LAB — BASIC METABOLIC PANEL
CO2: 26 mEq/L (ref 19–32)
Chloride: 105 mEq/L (ref 96–112)
Creatinine, Ser: 0.51 mg/dL (ref 0.50–1.10)
GFR calc Af Amer: >90 mL/min (ref >90)
Potassium: 4.4 mEq/L (ref 3.5–5.1)
Sodium: 140 mEq/L (ref 135–145)

## 2011-07-20 LAB — URINALYSIS, ROUTINE W REFLEX MICROSCOPIC
Bilirubin Urine: NEGATIVE
Glucose, UA: NEGATIVE mg/dL
Leukocytes, UA: NEGATIVE
Nitrite: NEGATIVE
Specific Gravity, Urine: 1.013 (ref 1.005–1.030)
pH: 8 (ref 5.0–8.0)

## 2011-07-20 MED ORDER — CHLORHEXIDINE GLUCONATE 4 % EX LIQD: 60.0000 mL | Freq: Once | CUTANEOUS | Status: DC

## 2011-07-20 NOTE — Progress Notes (Signed)
Pt. Reports that she went  To [facility] clinic for cardiac tests, 3 yrs. Ago? due to being on Redux for diet management.  Call for records, none available per staff at [facility].    

## 2011-07-20 NOTE — Pre-Procedure Instructions (Signed)
20 JUDITHE KEETCH  07/20/2011   Your procedure is scheduled on: 07/28/2011  Report to [facility] at 5:30 AM.  Call this number if you have problems the morning of surgery: [phone]   Remember:   Do not eat food:After Midnight.  May have clear liquids: up to 4 Hours before arrival.  Clear liquids include soda, tea, black coffee, apple or grape juice, broth.  Take these medicines the morning of surgery with A SIP OF WATER: pain medicine if needed    Do not wear jewelry, make-up or nail polish.  Do not wear lotions, powders, or perfumes. You may wear deodorant.  Do not shave 48 hours prior to surgery.  Do not bring valuables to the hospital.  Contacts, dentures or bridgework may not be worn into surgery.  Leave suitcase in the car. After surgery it may be brought to your room.  For patients admitted to the hospital, checkout time is 11:00 AM the day of discharge.   Patients discharged the day of surgery will not be allowed to drive home.  Name and phone number of your driver: /w sons- [place] & Sidonie Dickens  Special Instructions: CHG Shower Use Special Wash: 1/2 bottle night before surgery and 1/2 bottle morning of surgery.   Please read over the following fact sheets that you were given: Pain Booklet, Coughing and Deep Breathing, Blood Transfusion Information, Total Joint Packet, MRSA Information and Surgical Site Infection Prevention

## 2011-07-27 MED ORDER — CEFAZOLIN SODIUM-DEXTROSE 2-3 GM-% IV SOLR
2.0000 g | INTRAVENOUS | Status: AC
  Administered 2011-07-28: 2 g via INTRAVENOUS
  Filled 2011-07-27: qty 50

## 2011-07-27 MED ORDER — CEFAZOLIN SODIUM-DEXTROSE 2-3 GM-% IV SOLR: 2.0000 g | INTRAVENOUS | Status: DC

## 2011-07-27 NOTE — H&P (Signed)
NAME:  Leah Shah NO.:  [id number]  MEDICAL RECORD NO.:  [id number]  LOCATION:                                 FACILITY:  PHYSICIAN:  Burnard Bunting, M.D.         DATE OF BIRTH:  DATE OF ADMISSION: DATE OF DISCHARGE:                             HISTORY & PHYSICAL   CHIEF COMPLAINT:  Right knee pain.  HISTORY OF PRESENT ILLNESS:  Leah Shah is a 56 year old patient with right knee pain.  She has significant pain in her knees.  She has had arthroscopic debridement several years ago.  She has had progressive and unrelenting pain since that time.  Her pain interferes with her sleep, ADLs.  She has decreased mobility.  As a result of this knee pain, she has tried and failed multiple conservative therapies including strengthening, decreased activity level, racing, and injections.  She has pain, which keeps her awake at night.  She has taken medications that have not helped.  CURRENT MEDICATIONS:  Losartan, simvastatin, trazodone, atenolol, hydroxyzine, cyclobenzaprine, and oxycodone as needed for pain.  ALLERGIES:  No known drug allergies.  PAST SURGICAL HISTORY:  She had her gallbladder removed in 2012, arthroscopy of the right knee in 2010.  She had her coccyx removed in 2004.  FAMILY MEDICAL HISTORY:  Positive for diabetes.  Negative for high blood pressure, heart disease, and lung disease.  Negative for DVT or pulmonary embolism.  SOCIAL HISTORY:  The patient is disabled.  She does smoke, does not drink.  REVIEW OF SYSTEMS:  All other systems reviewed and are negative other than related to right knee.  PHYSICAL EXAMINATION:  GENERAL:  The patient is well developed, well nourished, in no acute distress.  Alert and oriented.  Increased body mass index. CHEST:  Clear to auscultation. HEART:  Heart beats regular. ABDOMEN:  Benign. HEENT:  Extraocular movements intact. EXTREMITIES:  Right knee demonstrates intact skin.  She has palpable pedal  pulses, intact extensor mechanism, stable collateral cruciate ligament.  IMAGING: Her radiographs do show tricompartmental degenerative changes.  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  IMPRESSION:  End-stage right knee arthritis in a patient with multiple medical comorbidities.  DECISION MAKING:  She failed conservative measures.  Since I had seen her, she had been to her dentist and had all tooth issues addressed and resolved.  The patient desires total knee placement, which is relatively risky at her age.  She is at slightly high risk of complication due to her smoking and obesity.  All this was explained to the patient. Nonetheless, she desires proceed with knee replacement.  The patient understands the risks and benefits.  They were limited to infection, nerve and vessel damage, knee stiffness, incomplete pain relief, as well as high likelihood of need for revision in her life.  All questions were answered.     Burnard Bunting, M.D.     GSD/MEDQ  D:  07/24/2011  T:  07/25/2011  Job:  [id number]

## 2011-07-28 ENCOUNTER — Encounter (HOSPITAL_COMMUNITY): Payer: Self-pay | Admitting: *Deleted

## 2011-07-28 ENCOUNTER — Ambulatory Visit (HOSPITAL_COMMUNITY): Payer: Medicare HMO

## 2011-07-28 ENCOUNTER — Ambulatory Visit (HOSPITAL_COMMUNITY): Payer: Medicare HMO | Admitting: Anesthesiology

## 2011-07-28 ENCOUNTER — Inpatient Hospital Stay (HOSPITAL_COMMUNITY)
Admission: RE | Admit: 2011-07-28 | Discharge: 2011-08-01 | DRG: 470 | Disposition: A | Discharge status: Home Health | Payer: Medicare HMO | Source: Ambulatory Visit | Attending: Orthopedic Surgery | Admitting: Orthopedic Surgery

## 2011-07-28 ENCOUNTER — Encounter (HOSPITAL_COMMUNITY): Payer: Self-pay | Admitting: Anesthesiology

## 2011-07-28 ENCOUNTER — Encounter (HOSPITAL_COMMUNITY)
Admission: RE | Disposition: A | Discharge status: Home Health | Payer: Self-pay | Source: Ambulatory Visit | Attending: Orthopedic Surgery

## 2011-07-28 DIAGNOSIS — Z96659 Presence of unspecified artificial knee joint: Secondary | ICD-10-CM

## 2011-07-28 DIAGNOSIS — M1710 Unilateral primary osteoarthritis, unspecified knee: Principal | ICD-10-CM | POA: Diagnosis present

## 2011-07-28 DIAGNOSIS — Z886 Allergy status to analgesic agent status: Secondary | ICD-10-CM

## 2011-07-28 DIAGNOSIS — Z7901 Long term (current) use of anticoagulants: Secondary | ICD-10-CM

## 2011-07-28 DIAGNOSIS — M1711 Unilateral primary osteoarthritis, right knee: Secondary | ICD-10-CM

## 2011-07-28 DIAGNOSIS — Z8679 Personal history of other diseases of the circulatory system: Secondary | ICD-10-CM

## 2011-07-28 DIAGNOSIS — F17200 Nicotine dependence, unspecified, uncomplicated: Secondary | ICD-10-CM | POA: Diagnosis present

## 2011-07-28 DIAGNOSIS — Z9089 Acquired absence of other organs: Secondary | ICD-10-CM

## 2011-07-28 DIAGNOSIS — F411 Generalized anxiety disorder: Secondary | ICD-10-CM | POA: Diagnosis present

## 2011-07-28 HISTORY — PX: KNEE ARTHROPLASTY: SHX992

## 2011-07-28 LAB — TYPE AND SCREEN: ABO/RH(D): B POS

## 2011-07-28 SURGERY — ARTHROPLASTY, KNEE, TOTAL, USING IMAGELESS COMPUTER-ASSISTED NAVIGATION
Anesthesia: General | Site: Knee | Laterality: Right | Wound class: Clean

## 2011-07-28 MED ORDER — LOSARTAN POTASSIUM 50 MG PO TABS
50.0000 mg | ORAL_TABLET | Freq: Every day | ORAL | Status: DC
  Administered 2011-07-28 – 2011-08-01 (×3): 50 mg via ORAL
  Filled 2011-07-28 (×5): qty 1

## 2011-07-28 MED ORDER — ACETAMINOPHEN 10 MG/ML IV SOLN: 1000.0000 mg | Freq: Once | INTRAVENOUS | Status: DC | PRN

## 2011-07-28 MED ORDER — HYDROXYZINE HCL 50 MG PO TABS
50.0000 mg | ORAL_TABLET | Freq: Every day | ORAL | Status: DC
  Administered 2011-07-28 – 2011-07-31 (×4): 50 mg via ORAL
  Filled 2011-07-28 (×7): qty 1

## 2011-07-28 MED ORDER — WARFARIN SODIUM 7.5 MG PO TABS
7.5000 mg | ORAL_TABLET | Freq: Once | ORAL | Status: AC
  Administered 2011-07-28: 7.5 mg via ORAL
  Filled 2011-07-28 (×2): qty 1

## 2011-07-28 MED ORDER — PHENOL 1.4 % MT LIQD
1.0000 | OROMUCOSAL | Status: DC | PRN
  Filled 2011-07-28: qty 177

## 2011-07-28 MED ORDER — CEFAZOLIN SODIUM-DEXTROSE 2-3 GM-% IV SOLR: 2.0000 g | INTRAVENOUS | Status: DC

## 2011-07-28 MED ORDER — HYDROMORPHONE HCL PF 1 MG/ML IJ SOLN
0.2500 mg | INTRAMUSCULAR | Status: DC | PRN
  Administered 2011-07-28: 0.5 mg via INTRAVENOUS

## 2011-07-28 MED ORDER — MENTHOL 3 MG MT LOZG: 1.0000 | LOZENGE | OROMUCOSAL | Status: DC | PRN

## 2011-07-28 MED ORDER — CHLORHEXIDINE GLUCONATE 4 % EX LIQD: 60.0000 mL | Freq: Once | CUTANEOUS | Status: DC

## 2011-07-28 MED ORDER — ACETAMINOPHEN 325 MG PO TABS
650.0000 mg | ORAL_TABLET | Freq: Four times a day (QID) | ORAL | Status: DC | PRN
  Administered 2011-07-29 – 2011-07-30 (×3): 650 mg via ORAL
  Filled 2011-07-28 (×3): qty 2

## 2011-07-28 MED ORDER — CEFAZOLIN SODIUM 1-5 GM-% IV SOLN
1.0000 g | Freq: Three times a day (TID) | INTRAVENOUS | Status: AC
  Administered 2011-07-28 – 2011-07-29 (×3): 1 g via INTRAVENOUS
  Filled 2011-07-28 (×3): qty 50

## 2011-07-28 MED ORDER — METOCLOPRAMIDE HCL 5 MG/ML IJ SOLN
5.0000 mg | Freq: Three times a day (TID) | INTRAMUSCULAR | Status: DC | PRN
  Filled 2011-07-28: qty 2

## 2011-07-28 MED ORDER — ACETAMINOPHEN 10 MG/ML IV SOLN
INTRAVENOUS | Status: DC | PRN
  Administered 2011-07-28: 1000 mg via INTRAVENOUS

## 2011-07-28 MED ORDER — MORPHINE SULFATE 4 MG/ML IJ SOLN: 4.0000 mg | INTRAMUSCULAR | Status: DC | PRN

## 2011-07-28 MED ORDER — BUPIVACAINE-EPINEPHRINE 0.5% -1:200000 IJ SOLN
INTRAMUSCULAR | Status: DC | PRN
  Administered 2011-07-28: 20 mL

## 2011-07-28 MED ORDER — PROPOFOL 10 MG/ML IV EMUL
INTRAVENOUS | Status: DC | PRN
  Administered 2011-07-28: 200 mg via INTRAVENOUS

## 2011-07-28 MED ORDER — ROCURONIUM BROMIDE 100 MG/10ML IV SOLN
INTRAVENOUS | Status: DC | PRN
  Administered 2011-07-28: 50 mg via INTRAVENOUS

## 2011-07-28 MED ORDER — ONDANSETRON HCL 4 MG/2ML IJ SOLN: 4.0000 mg | Freq: Four times a day (QID) | INTRAMUSCULAR | Status: DC | PRN

## 2011-07-28 MED ORDER — WARFARIN VIDEO: Freq: Once | Status: DC

## 2011-07-28 MED ORDER — LACTATED RINGERS IV SOLN
INTRAVENOUS | Status: DC | PRN
  Administered 2011-07-28 (×2): via INTRAVENOUS

## 2011-07-28 MED ORDER — TRAZODONE HCL 150 MG PO TABS
150.0000 mg | ORAL_TABLET | Freq: Every day | ORAL | Status: DC
  Administered 2011-07-29 – 2011-07-31 (×3): 150 mg via ORAL
  Filled 2011-07-28 (×5): qty 1

## 2011-07-28 MED ORDER — ONDANSETRON HCL 4 MG PO TABS: 4.0000 mg | ORAL_TABLET | Freq: Four times a day (QID) | ORAL | Status: DC | PRN

## 2011-07-28 MED ORDER — METOCLOPRAMIDE HCL 10 MG PO TABS: 5.0000 mg | ORAL_TABLET | Freq: Three times a day (TID) | ORAL | Status: DC | PRN

## 2011-07-28 MED ORDER — ACETAMINOPHEN 650 MG RE SUPP: 650.0000 mg | Freq: Four times a day (QID) | RECTAL | Status: DC | PRN

## 2011-07-28 MED ORDER — ALPRAZOLAM 0.5 MG PO TABS: 1.0000 mg | ORAL_TABLET | Freq: Every evening | ORAL | Status: DC | PRN

## 2011-07-28 MED ORDER — OXYCODONE HCL 5 MG PO TABS
5.0000 mg | ORAL_TABLET | ORAL | Status: DC | PRN
  Administered 2011-07-30: 10 mg via ORAL
  Administered 2011-07-31: 5 mg via ORAL
  Administered 2011-07-31: 10 mg via ORAL
  Administered 2011-07-31: 5 mg via ORAL
  Administered 2011-08-01 (×2): 10 mg via ORAL
  Filled 2011-07-28 (×5): qty 2

## 2011-07-28 MED ORDER — POTASSIUM CHLORIDE IN NACL 20-0.9 MEQ/L-% IV SOLN
INTRAVENOUS | Status: DC
  Administered 2011-07-28 – 2011-07-29 (×2): via INTRAVENOUS
  Filled 2011-07-28 (×2): qty 1000

## 2011-07-28 MED ORDER — MORPHINE SULFATE (PF) 1 MG/ML IV SOLN
INTRAVENOUS | Status: DC
  Administered 2011-07-28: 10.5 mg via INTRAVENOUS
  Administered 2011-07-28: 9 mg via INTRAVENOUS
  Administered 2011-07-29: 12 mg via INTRAVENOUS
  Administered 2011-07-29: 02:00:00 via INTRAVENOUS
  Administered 2011-07-29: 4.87 mg via INTRAVENOUS
  Administered 2011-07-29: 2 mg via INTRAVENOUS
  Administered 2011-07-29: 6.97 mg via INTRAVENOUS
  Administered 2011-07-29: 0.201 mg via INTRAVENOUS
  Filled 2011-07-28 (×4): qty 25

## 2011-07-28 MED ORDER — MORPHINE SULFATE (PF) 1 MG/ML IV SOLN
INTRAVENOUS | Status: DC
  Administered 2011-07-28: 12:00:00 via INTRAVENOUS
  Filled 2011-07-28: qty 25

## 2011-07-28 MED ORDER — COUMADIN BOOK
Freq: Once | Status: AC
  Administered 2011-07-28: 17:00:00
  Filled 2011-07-28: qty 1

## 2011-07-28 MED ORDER — ALBUTEROL SULFATE HFA 108 (90 BASE) MCG/ACT IN AERS
INHALATION_SPRAY | RESPIRATORY_TRACT | Status: DC | PRN
  Administered 2011-07-28 (×2): 2 via RESPIRATORY_TRACT

## 2011-07-28 MED ORDER — SENNA 8.6 MG PO TABS
1.0000 | ORAL_TABLET | Freq: Two times a day (BID) | ORAL | Status: DC
  Administered 2011-07-28 – 2011-08-01 (×8): 8.6 mg via ORAL
  Filled 2011-07-28 (×9): qty 1

## 2011-07-28 MED ORDER — CLONIDINE HCL (ANALGESIA) 100 MCG/ML EP SOLN
150.0000 ug | Freq: Once | EPIDURAL | Status: DC
  Filled 2011-07-28: qty 1.5

## 2011-07-28 MED ORDER — ATENOLOL 100 MG PO TABS
100.0000 mg | ORAL_TABLET | Freq: Every day | ORAL | Status: DC
  Administered 2011-07-28 – 2011-08-01 (×3): 100 mg via ORAL
  Filled 2011-07-28 (×5): qty 1

## 2011-07-28 MED ORDER — POTASSIUM CHLORIDE IN NACL 20-0.9 MEQ/L-% IV SOLN
INTRAVENOUS | Status: AC
  Administered 2011-07-28: 15:00:00 via INTRAVENOUS
  Filled 2011-07-28 (×2): qty 1000

## 2011-07-28 MED ORDER — SODIUM CHLORIDE 0.9 % IJ SOLN: 9.0000 mL | INTRAMUSCULAR | Status: DC | PRN

## 2011-07-28 MED ORDER — ONDANSETRON HCL 4 MG/2ML IJ SOLN
INTRAMUSCULAR | Status: DC | PRN
  Administered 2011-07-28: 4 mg via INTRAVENOUS

## 2011-07-28 MED ORDER — ONDANSETRON HCL 4 MG/2ML IJ SOLN: 4.0000 mg | Freq: Once | INTRAMUSCULAR | Status: DC | PRN

## 2011-07-28 MED ORDER — FENTANYL CITRATE 0.05 MG/ML IJ SOLN
INTRAMUSCULAR | Status: DC | PRN
  Administered 2011-07-28 (×3): 50 ug via INTRAVENOUS
  Administered 2011-07-28: 250 ug via INTRAVENOUS

## 2011-07-28 MED ORDER — ACETAMINOPHEN 10 MG/ML IV SOLN
INTRAVENOUS | Status: AC
  Filled 2011-07-28: qty 100

## 2011-07-28 MED ORDER — MIDAZOLAM HCL 5 MG/5ML IJ SOLN
INTRAMUSCULAR | Status: DC | PRN
  Administered 2011-07-28: 2 mg via INTRAVENOUS

## 2011-07-28 MED ORDER — CYCLOBENZAPRINE HCL 10 MG PO TABS
10.0000 mg | ORAL_TABLET | Freq: Every day | ORAL | Status: DC
  Administered 2011-07-28 – 2011-07-31 (×4): 10 mg via ORAL
  Filled 2011-07-28 (×5): qty 1

## 2011-07-28 MED ORDER — SIMVASTATIN 40 MG PO TABS
40.0000 mg | ORAL_TABLET | Freq: Every day | ORAL | Status: DC
  Administered 2011-07-28 – 2011-07-31 (×4): 40 mg via ORAL
  Filled 2011-07-28 (×5): qty 1

## 2011-07-28 MED ORDER — NALOXONE HCL 0.4 MG/ML IJ SOLN: 0.4000 mg | INTRAMUSCULAR | Status: DC | PRN

## 2011-07-28 MED ORDER — MORPHINE SULFATE 4 MG/ML IJ SOLN
INTRAMUSCULAR | Status: DC | PRN
Start: 2011-07-28 — End: 2011-07-28
  Administered 2011-07-28 (×2): 4 mg via SUBCUTANEOUS

## 2011-07-28 MED ORDER — CLONIDINE HCL (ANALGESIA) 100 MCG/ML EP SOLN
EPIDURAL | Status: DC | PRN
  Administered 2011-07-28: 1 mL via INTRA_ARTICULAR

## 2011-07-28 SURGICAL SUPPLY — 81 items
BANDAGE ELASTIC 4 VELCRO ST LF (GAUZE/BANDAGES/DRESSINGS) ×2 IMPLANT
BANDAGE ELASTIC 6 VELCRO ST LF (GAUZE/BANDAGES/DRESSINGS) ×1 IMPLANT
BANDAGE ESMARK 6X9 LF (GAUZE/BANDAGES/DRESSINGS) ×1 IMPLANT
BLADE SAG 18X100X1.27 (BLADE) ×2 IMPLANT
BLADE SAW SGTL 13.0X1.19X90.0M (BLADE) ×2 IMPLANT
BNDG CMPR 9X6 STRL LF SNTH (GAUZE/BANDAGES/DRESSINGS) ×1
BNDG CMPR MED 10X6 ELC LF (GAUZE/BANDAGES/DRESSINGS) ×3
BNDG COHESIVE 6X5 TAN STRL LF (GAUZE/BANDAGES/DRESSINGS) ×2 IMPLANT
BNDG ELASTIC 6X10 VLCR STRL LF (GAUZE/BANDAGES/DRESSINGS) ×6 IMPLANT
BNDG ESMARK 6X9 LF (GAUZE/BANDAGES/DRESSINGS) ×2
BOWL SMART MIX CTS (DISPOSABLE) ×2 IMPLANT
CEMENT HV SMART SET (Cement) ×4 IMPLANT
CLOTH BEACON ORANGE TIMEOUT ST (SAFETY) ×2 IMPLANT
COVER BACK TABLE 24X17X13 BIG (DRAPES) IMPLANT
COVER SURGICAL LIGHT HANDLE (MISCELLANEOUS) ×2 IMPLANT
CUFF TOURNIQUET SINGLE 34IN LL (TOURNIQUET CUFF) ×1 IMPLANT
CUFF TOURNIQUET SINGLE 44IN (TOURNIQUET CUFF) IMPLANT
DRAPE INCISE IOBAN 66X45 STRL (DRAPES) ×1 IMPLANT
DRAPE ORTHO SPLIT 77X108 STRL (DRAPES) ×6
DRAPE PROXIMA HALF (DRAPES) ×2 IMPLANT
DRAPE SURG ORHT 6 SPLT 77X108 (DRAPES) ×3 IMPLANT
DRAPE U-SHAPE 47X51 STRL (DRAPES) ×2 IMPLANT
DRAPE X-RAY CASS 24X20 (DRAPES) IMPLANT
DRSG PAD ABDOMINAL 8X10 ST (GAUZE/BANDAGES/DRESSINGS) ×3 IMPLANT
DURAPREP 26ML APPLICATOR (WOUND CARE) ×2 IMPLANT
ELECT REM PT RETURN 9FT ADLT (ELECTROSURGICAL) ×2
ELECTRODE REM PT RTRN 9FT ADLT (ELECTROSURGICAL) ×1 IMPLANT
EVACUATOR 1/8 PVC DRAIN (DRAIN) ×2 IMPLANT
FACESHIELD LNG OPTICON STERILE (SAFETY) ×2 IMPLANT
FLUID NSS /IRRIG 3000 ML XXX (IV SOLUTION) ×2 IMPLANT
GAUZE SPONGE 4X4 12PLY STRL LF (GAUZE/BANDAGES/DRESSINGS) ×1 IMPLANT
GAUZE XEROFORM 5X9 LF (GAUZE/BANDAGES/DRESSINGS) ×2 IMPLANT
GLOVE BIO SURGEON ST LM GN SZ9 (GLOVE) ×1 IMPLANT
GLOVE BIOGEL PI IND STRL 8 (GLOVE) ×1 IMPLANT
GLOVE BIOGEL PI INDICATOR 8 (GLOVE) ×1
GLOVE SURG ORTHO 8.0 STRL STRW (GLOVE) ×2 IMPLANT
GOWN PREVENTION PLUS LG XLONG (DISPOSABLE) IMPLANT
GOWN PREVENTION PLUS XLARGE (GOWN DISPOSABLE) ×2 IMPLANT
GOWN STRL NON-REIN LRG LVL3 (GOWN DISPOSABLE) ×6 IMPLANT
HANDPIECE INTERPULSE COAX TIP (DISPOSABLE) ×2
HOOD PEEL AWAY FACE SHEILD DIS (HOOD) ×6 IMPLANT
IMMOBILIZER KNEE 20 (SOFTGOODS)
IMMOBILIZER KNEE 20 THIGH 36 (SOFTGOODS) IMPLANT
IMMOBILIZER KNEE 22 UNIV (SOFTGOODS) ×1 IMPLANT
IMMOBILIZER KNEE 24 THIGH 36 (MISCELLANEOUS) IMPLANT
IMMOBILIZER KNEE 24 UNIV (MISCELLANEOUS)
KIT BASIN OR (CUSTOM PROCEDURE TRAY) ×2 IMPLANT
KIT ROOM TURNOVER OR (KITS) ×2 IMPLANT
MANIFOLD NEPTUNE II (INSTRUMENTS) ×2 IMPLANT
MARKER SPHERE PSV REFLC THRD 5 (MARKER) ×6 IMPLANT
NDL 18GX1X1/2 (RX/OR ONLY) (NEEDLE) ×1 IMPLANT
NDL SPNL 18GX3.5 QUINCKE PK (NEEDLE) ×1 IMPLANT
NEEDLE 18GX1X1/2 (RX/OR ONLY) (NEEDLE) ×2 IMPLANT
NEEDLE SPNL 18GX3.5 QUINCKE PK (NEEDLE) ×2 IMPLANT
NS IRRIG 1000ML POUR BTL (IV SOLUTION) ×2 IMPLANT
PACK TOTAL JOINT (CUSTOM PROCEDURE TRAY) ×2 IMPLANT
PAD ARMBOARD 7.5X6 YLW CONV (MISCELLANEOUS) ×4 IMPLANT
PAD CAST 4YDX4 CTTN HI CHSV (CAST SUPPLIES) ×1 IMPLANT
PADDING CAST COTTON 4X4 STRL (CAST SUPPLIES) ×2
PADDING CAST COTTON 6X4 STRL (CAST SUPPLIES) ×6 IMPLANT
PADDING WEBRIL 4 STERILE (GAUZE/BANDAGES/DRESSINGS) ×1 IMPLANT
PADDING WEBRIL 6 STERILE (GAUZE/BANDAGES/DRESSINGS) ×1 IMPLANT
PIN SCHANZ 4MM 130MM (PIN) ×15 IMPLANT
RUBBERBAND STERILE (MISCELLANEOUS) ×2 IMPLANT
SET HNDPC FAN SPRY TIP SCT (DISPOSABLE) ×1 IMPLANT
SPONGE GAUZE 4X4 12PLY (GAUZE/BANDAGES/DRESSINGS) ×2 IMPLANT
SPONGE LAP 18X18 X RAY DECT (DISPOSABLE) ×1 IMPLANT
STAPLER VISISTAT 35W (STAPLE) ×2 IMPLANT
SUCTION FRAZIER TIP 10 FR DISP (SUCTIONS) ×2 IMPLANT
SUT ETHILON 3 0 PS 1 (SUTURE) ×4 IMPLANT
SUT VIC AB 0 CTB1 27 (SUTURE) ×6 IMPLANT
SUT VIC AB 1 CT1 27 (SUTURE) ×10
SUT VIC AB 1 CT1 27XBRD ANBCTR (SUTURE) ×5 IMPLANT
SUT VIC AB 2-0 CT1 27 (SUTURE) ×4
SUT VIC AB 2-0 CT1 TAPERPNT 27 (SUTURE) ×2 IMPLANT
SYR 30ML SLIP (SYRINGE) ×2 IMPLANT
SYR TB 1ML LUER SLIP (SYRINGE) ×2 IMPLANT
TOWEL OR 17X24 6PK STRL BLUE (TOWEL DISPOSABLE) ×2 IMPLANT
TOWEL OR 17X26 10 PK STRL BLUE (TOWEL DISPOSABLE) ×8 IMPLANT
TRAY FOLEY CATH 14FR (SET/KITS/TRAYS/PACK) ×2 IMPLANT
WATER STERILE IRR 1000ML POUR (IV SOLUTION) ×6 IMPLANT

## 2011-07-28 NOTE — Anesthesia Postprocedure Evaluation (Signed)
  Anesthesia Post-op Note  Patient: Leah Shah  Procedure(s) Performed:  COMPUTER ASSISTED TOTAL KNEE ARTHROPLASTY - right total knee arthroplasty, computer assisted  Patient Location: PACU  Anesthesia Type: General  Level of Consciousness: awake and alert   Airway and Oxygen Therapy: Patient Spontanous Breathing and Patient connected to nasal cannula oxygen  Post-op Pain: mild  Post-op Assessment: Post-op Vital signs reviewed  Post-op Vital Signs: stable  Complications: No apparent anesthesia complications

## 2011-07-28 NOTE — Transfer of Care (Signed)
Immediate Anesthesia Transfer of Care Note  Patient: Leah Shah  Procedure(s) Performed:  COMPUTER ASSISTED TOTAL KNEE ARTHROPLASTY - right total knee arthroplasty, computer assisted  Patient Location: PACU  Anesthesia Type: General  Level of Consciousness: awake, alert , oriented and patient cooperative  Airway & Oxygen Therapy: Patient Spontanous Breathing, Patient connected to nasal cannula oxygen and Patient connected to face mask  Post-op Assessment: Report given to PACU RN, Post -op Vital signs reviewed and stable and Patient moving all extremities  Post vital signs: Reviewed and stable  Complications: No apparent anesthesia complications

## 2011-07-28 NOTE — H&P (View-Only) (Signed)
Pt. Reports that she went  To [facility] clinic for cardiac tests, 3 yrs. Ago? due to being on Redux for diet management.  Call for records, none available per staff at [facility].

## 2011-07-28 NOTE — Preoperative (Signed)
Beta Blockers   Reason not to administer Beta Blockers:Not Applicable, Took atenolol 1/21 at 2200

## 2011-07-28 NOTE — Op Note (Signed)
NAME:  Leah, Shah NO.:  [id number]  MEDICAL RECORD NO.:  [id number]  LOCATION:  [facility]                         FACILITY:  [facility]  PHYSICIAN:  Burnard Bunting, M.D.    DATE OF BIRTH:  1956/05/04  DATE OF PROCEDURE: DATE OF DISCHARGE:                              OPERATIVE REPORT   PREOPERATIVE DIAGNOSIS:  Right knee arthritis.  POSTOPERATIVE:  Right knee arthritis.  PROCEDURE:  Right total knee replacement using DePuy rotating platform, PCL sacrificing femur size 3 tibia, 12.5 poly, 35 mm patella.  SURGEON:  Burnard Bunting, M.D.  ASSISTANT:  None.  ANESTHESIA:  General endotracheal.  ESTIMATED BLOOD LOSS:  100 mL.  TOURNIQUET TIME:  90 minutes at 300 mmHg.  INDICATION:  Leah Shah is a 56 year old patient with right knee arthritis, end-stage with failure of conservative management, presents for her knee replacement after explanation of risks and benefits.  Tibia resection 8.5, distal femur resection 11.3.  PROCEDURE IN DETAIL:  The patient was brought to the operating room, where general endotracheal anesthesia was induced.  Preoperative antibiotics were administered. A time-out was called.  Right leg was prescrubbed with alcohol and Betadine, which allowed to air dry, prepped with DuraPrep solution, draped in a sterile manner.  Leah Shah was used to cover the operative field.  Leg was elevated.  Saline Esmarch wrap. Tourniquet was inflated.  Incision was made anterior on the knee, skin, subcu tissue sharply divided.  Median parapatellar approach was made. Precise location was marked over bolster using number #1 Vicryl suture. Fat pad was partially excised.  Lateral patellofemoral ligament was released.  Soft tissue from the anterior distal femur was also released. Two pins were then placed percutaneously to the proximal medial tibia and distal medial femur.  Registration points for computer guidance were achieved including hip center, rotation,  bimalleolar axis, and various points about the knee.  ACL and PCL were released.  Osteophytes were removed.  With the collaterals and posterior neurovascular structures protected, the tibial cut was made perpendicular mechanical axis, 8.5 mm.  Tensioning was then checked in extension and flexion.  The femoral resection was then made 11.3 mm.  This gave full extension with a 10 mm spacer block.  Chamfer cuts and box cuts were then made on the femur and tibia was keel punched.  Both were size 3.  The patella was then prepared freehand fashion with about 10 mm resection off the patella. With trial components in position, the patient had excellent stability varus and valgus stress at 039 degrees with excellent patellar tracking using no thumbs technique.  This was both with a 10 and 12 spacer. Trial components were removed.  3 L of pulsatile irrigation solution was used to irrigate the knee.  True components were then cemented into position except for the spacer.  A 12 mm spacer gave full extension, excellent stability, and excellent range of motion.  This test was this patient had chosen.  Tourniquet was released prior to placement of the spacer, bleeding points encountered and controlled using electrocautery. Thorough irrigation was again performed.  True spacer was placed.  Same stability parameters were maintained same.  Incision was then  closed over bolster using interrupted inverted #1 Vicryl suture. Reapproximated the median parapatellar arthrotomy.  Hemovac drain was placed in and within the knee.  The incision was then closed using 0 Vicryl suture, 0 Vicryl suture, and skin staples.  Percutaneous incisions for the pins were irrigated and closed using 3-0 nylon. Solution of Marcaine, morphine, and clonidine injected into the knee. Bulky wrap and knee immobilizer was placed.  The patient tolerated well without immediate complications.     Burnard Bunting, M.D.     GSD/MEDQ  D:   07/28/2011  T:  07/28/2011  Job:  [id number]

## 2011-07-28 NOTE — Brief Op Note (Signed)
07/28/2011  10:41 AM  PATIENT:  Leah Shah  56 y.o. female  PRE-OPERATIVE DIAGNOSIS:  Right knee osteoarthritis  POST-OPERATIVE DIAGNOSIS:  Right knee osteoarthritis  PROCEDURE:  Procedure(s): COMPUTER ASSISTED TOTAL KNEE ARTHROPLASTY  SURGEON:  Surgeon(s): Cammy Copa, MD  ASSISTANT: none  ANESTHESIA:   general  EBL: 100 ml    Total I/O In: 1500 [I.V.:1500] Out: 425 [Urine:350; Blood:75]  BLOOD ADMINISTERED: none  DRAINS: () Hemovact drain(s) in the knee with  Suction Clamped   LOCAL MEDICATIONS USED:  none  SPECIMEN:  No Specimen  COUNTS:  YES  TOURNIQUET:   Total Tourniquet Time Documented: Thigh (Right) - 99 minutes  DICTATION: .Other Dictation: Dictation Number [id number]   PLAN OF CARE: Admit to inpatient   PATIENT DISPOSITION:  PACU - hemodynamically stable

## 2011-07-28 NOTE — Progress Notes (Signed)
ANTICOAGULATION CONSULT NOTE - Initial Consult  Pharmacy Consult for Coumadin Indication: VTE prophylaxis  Allergies  Allergen Reactions  . Codeine     Itching "she can take some forms of it"    Patient Measurements: Height: 5\' 8"  (172.7 cm) (from preop entry) Weight: 308 lb 3.2 oz (139.799 kg) (from preop 07/20/11 entry) IBW/kg (Calculated) : 63.9    Vital Signs: Temp: 97.7 F (36.5 C) (01/22 1528) Temp src: Oral (01/22 1528) BP: 136/73 mmHg (01/22 1528) Pulse Rate: 66  (01/22 1528)  Labs: preop labs 07/20/11  WBC 10.3, Hgb 13.4, Hct 40.6, PLTC 187, SCr 0.51 No results found for this basename: HGB:2,HCT:3,PLT:3,APTT:3,LABPROT:3,INR:3,HEPARINUNFRC:3,CREATININE:3,CKTOTAL:3,CKMB:3,TROPONINI:3 in the last 72 hours Estimated Creatinine Clearance: 116.9 ml/min (by C-G formula based on Cr of 0.51).  Medical History: Past Medical History  Diagnosis Date  . Heart valve problem     was on Redux and it caused some "heart valve damage"-does not see cardiologist  . Shortness of breath     with exertion  . Headache   . Anxiety   . Recurrent upper respiratory infection (URI)     treated /w OTC med.   . Arthritis     R knee, L knee - OA  . Hypertension     echoGavin Potters, wnl 2009? , had taken Redux for diet management  but now  off the market. pt, told that echo wnl.      Medications:  Prescriptions prior to admission  Medication Sig Dispense Refill  . atenolol (TENORMIN) 100 MG tablet Take 100 mg by mouth daily at 2 PM daily at 2 PM.       . cyclobenzaprine (FLEXERIL) 10 MG tablet Take 10 mg by mouth at bedtime. scheduled       . hydrOXYzine (ATARAX/VISTARIL) 25 MG tablet Take 50 mg by mouth at bedtime. scheduled       . losartan (COZAAR) 50 MG tablet Take 50 mg by mouth daily at 2 PM daily at 2 PM.       . oxyCODONE-acetaminophen (PERCOCET) 7.5-325 MG per tablet Take 1 tablet by mouth 2 (two) times daily as needed. For pain.       . simvastatin (ZOCOR) 40 MG tablet Take 40 mg  by mouth at bedtime.        . traZODone (DESYREL) 150 MG tablet Take 150 mg by mouth at bedtime.       [name] alprazolam (XANAX) 2 MG tablet Take 1 mg by mouth at bedtime as needed.      . Multiple Vitamin (MULTIVITAMIN PO) Take 1 tablet by mouth daily. Alive multivitamin (otc)        Scheduled:    . 0.9 % NaCl with KCl 20 mEq / L   Intravenous To PACU  . atenolol  100 mg Oral Q1400  .  ceFAZolin (ANCEF) IV  1 g Intravenous Q8H  .  ceFAZolin (ANCEF) IV  2 g Intravenous 60 min Pre-Op  . cyclobenzaprine  10 mg Oral QHS  . hydrOXYzine  50 mg Oral QHS  . losartan  50 mg Oral Q1400  . morphine   Intravenous Q4H  . morphine   Intravenous To PACU  . senna  1 tablet Oral BID  . simvastatin  40 mg Oral QHS  . traZODone  150 mg Oral QHS  . DISCONTD:  ceFAZolin (ANCEF) IV  2 g Intravenous 60 min Pre-Op  . DISCONTD:  ceFAZolin (ANCEF) IV  2 g Intravenous 60 min Pre-Op  . DISCONTD: chlorhexidine  60 mL Topical Once  . DISCONTD: cloNIDine  150 mcg Epidural Once    Assessment: 56 yo female s/p right TKA due to right knee osteo- arthritis. Now to start on Coumadin for VTE prophylaxis.  Goal of Therapy:  INR 2-3   Plan:  Coumadin 7.5mg  po tonight.  INR daily.  Coumadin education book and video ordered.  Arman Filter 07/28/2011,4:28 PM

## 2011-07-28 NOTE — Anesthesia Preprocedure Evaluation (Addendum)
Anesthesia Evaluation  Patient identified by MRN, date of birth, ID band Patient awake    Reviewed: Allergy & Precautions, H&P , NPO status , Patient's Chart, lab work & pertinent test results, reviewed documented beta blocker date and time   Airway Mallampati: II TM Distance: >3 FB     Dental  (+) Teeth Intact   Pulmonary  clear to auscultation        Cardiovascular Regular Normal    Neuro/Psych    GI/Hepatic   Endo/Other    Renal/GU      Musculoskeletal   Abdominal (+) obese,  Abdomen: soft.    Peds  Hematology   Anesthesia Other Findings   Reproductive/Obstetrics                          Anesthesia Physical Anesthesia Plan  ASA: III  Anesthesia Plan: General   Post-op Pain Management:    Induction: Intravenous  Airway Management Planned: Oral ETT  Additional Equipment:   Intra-op Plan:   Post-operative Plan: Extubation in OR  Informed Consent:   Dental advisory given  Plan Discussed with: CRNA and Surgeon  Anesthesia Plan Comments: (DJD R. Knee Morbid Obesity Htn  Plan GA )        Anesthesia Quick Evaluation

## 2011-07-28 NOTE — Interval H&P Note (Signed)
History and Physical Interval Note:  07/28/2011 7:21 AM  Leah Shah  has presented today for surgery, with the diagnosis of Right knee osteoarthritis  The various methods of treatment have been discussed with the patient and family. After consideration of risks, benefits and other options for treatment, the patient has consented to  Procedure(s): COMPUTER ASSISTED TOTAL KNEE ARTHROPLASTY as a surgical intervention .  The patients' history has been reviewed, patient examined, no change in status, stable for surgery.  I have reviewed the patients' chart and labs.  Questions were answered to the patient's satisfaction.     DEAN,GREGORY SCOTT

## 2011-07-29 ENCOUNTER — Encounter (HOSPITAL_COMMUNITY): Payer: Self-pay | Admitting: Orthopedic Surgery

## 2011-07-29 LAB — BASIC METABOLIC PANEL
BUN: 8 mg/dL (ref 6–23)
Chloride: 104 mEq/L (ref 96–112)
Creatinine, Ser: 0.57 mg/dL (ref 0.50–1.10)
GFR calc Af Amer: >90 mL/min (ref >90)
GFR calc non Af Amer: >90 mL/min (ref >90)
Potassium: 5.2 mEq/L — ABNORMAL HIGH (ref 3.5–5.1)

## 2011-07-29 LAB — PROTIME-INR
INR: 1.19 (ref 0.00–1.49)
Prothrombin Time: 15.4 seconds — ABNORMAL HIGH (ref 11.6–15.2)

## 2011-07-29 LAB — CBC
HCT: 36.7 % (ref 36.0–46.0)
MCHC: 31.3 g/dL (ref 30.0–36.0)
Platelets: 184 10*3/uL (ref 150–400)
RDW: 13.7 % (ref 11.5–15.5)
WBC: 12.9 10*3/uL — ABNORMAL HIGH (ref 4.0–10.5)

## 2011-07-29 MED ORDER — POTASSIUM CHLORIDE IN NACL 20-0.9 MEQ/L-% IV SOLN
INTRAVENOUS | Status: AC
  Filled 2011-07-29: qty 1000

## 2011-07-29 MED ORDER — WARFARIN SODIUM 7.5 MG PO TABS
7.5000 mg | ORAL_TABLET | Freq: Once | ORAL | Status: AC
  Administered 2011-07-29: 7.5 mg via ORAL
  Filled 2011-07-29 (×2): qty 1

## 2011-07-29 MED FILL — Morphine Sulfate Inj 4 MG/ML: INTRAMUSCULAR | Qty: 1 | Status: AC

## 2011-07-29 NOTE — Progress Notes (Signed)
UR COMPLETED  

## 2011-07-29 NOTE — Progress Notes (Signed)
Ortho tech notified for CPM for room 5007 at 2030. Per Ortho Tech no CPM currently available in the building and patient is added onto the list for when one becomes available. Elly Modena

## 2011-07-29 NOTE — Progress Notes (Signed)
Occupational Therapy Evaluation Patient Details Name: Leah Shah MRN: [id number] DOB: 25-Oct-1955 Today's Date: 07/29/2011  Problem List: There is no problem list on file for this patient.   Past Medical History:  Past Medical History  Diagnosis Date  . Heart valve problem     was on Redux and it caused some "heart valve damage"-does not see cardiologist  . Shortness of breath     with exertion  . Headache   . Anxiety   . Recurrent upper respiratory infection (URI)     treated /w OTC med.   . Arthritis     R knee, L knee - OA  . Hypertension     echoGavin Potters, wnl 2009? , had taken Redux for diet management  but now  off the market. pt, told that echo wnl.     Past Surgical History:  Past Surgical History  Procedure Date  . Abdominal hysterectomy     partial-uterus removed  . Tmj arthroplasty     [facility]- 1997  . Heel spur surgery   . Knee arthroscopy     right knee  . Coccyx removal     2004[facility]  . Eye surgery     states both lens replaced. Not sure if cataract surgery or not.  . Cholecystectomy     2011  . Knee arthroplasty 07/28/2011    Procedure: COMPUTER ASSISTED TOTAL KNEE ARTHROPLASTY;  Surgeon: Cammy Copa, MD;  Location: [facility] OR;  Service: Orthopedics;  Laterality: Right;  right total knee arthroplasty, computer assisted    OT Assessment/Plan/Recommendation OT Assessment Clinical Impression Statement: pt. presents s/p Rt TKA  and presents with increased pain and below problem list. Thus affecting pt's independence with ADLs. Pt. will benefit from skilled OT to increase independence with ADLs to min assist level at D/C  OT Recommendation/Assessment: Patient will need skilled OT in the acute care venue OT Problem List: Decreased strength;Decreased activity tolerance;Impaired balance (sitting and/or standing);Decreased safety awareness;Decreased knowledge of use of DME or AE;Decreased knowledge of precautions;Obesity;Pain Barriers to Discharge: Decreased  caregiver support Barriers to Discharge Comments: Husband frequently travels for weeks at a time. OT Therapy Diagnosis : Generalized weakness;Acute pain OT Plan OT Frequency: Min 1X/week OT Treatment/Interventions: Self-care/ADL training;DME and/or AE instruction;Therapeutic activities;Balance training;Patient/family education;Energy conservation OT Recommendation Follow Up Recommendations: Skilled nursing facility Equipment Recommended: Defer to next venue Individuals Consulted Consulted and Agree with Results and Recommendations: Patient OT Goals Acute Rehab OT Goals OT Goal Formulation: With patient Time For Goal Achievement: 2 weeks ADL Goals Pt Will Perform Grooming: with supervision;with set-up;Standing at sink ADL Goal: Grooming - Progress: Goal set today Pt Will Perform Lower Body Bathing: with min assist;Sit to stand from bed ADL Goal: Lower Body Bathing - Progress: Goal set today Pt Will Perform Lower Body Dressing: with min assist;Sit to stand from bed;with adaptive equipment ADL Goal: Lower Body Dressing - Progress: Goal set today Pt Will Transfer to Toilet: with min assist;3-in-1;with DME;Ambulation ADL Goal: Toilet Transfer - Progress: Goal set today Pt Will Perform Tub/Shower Transfer: Tub transfer;Transfer tub bench;with min assist ADL Goal: Tub/Shower Transfer - Progress: Goal set today  OT Evaluation Precautions/Restrictions  Precautions Precautions: Knee Required Braces or Orthoses: Yes Knee Immobilizer: On except when in CPM Restrictions Weight Bearing Restrictions: Yes RLE Weight Bearing: Weight bearing as tolerated Prior Functioning Home Living Lives With: Spouse;Other (Comment) (spouse works for weeks at a time, will be back for recovery) Type of Home: House Home Layout: One level Home Access:  Stairs to enter Entrance Stairs-Rails: None Entrance Stairs-Number of Steps: 3 Bathroom Shower/Tub: Forensic scientist: Standard Home  Adaptive Equipment: Walker - rolling;Bedside commode/3-in-1;Straight cane Prior Function Level of Independence: Independent with basic ADLs;Independent with transfers;Independent with gait Able to Take Stairs?: Yes Driving: Yes Vocation: On disability ADL ADL Eating/Feeding: Simulated;Set up Where Assessed - Eating/Feeding: Chair Grooming: Performed;Wash/dry face;Set up Where Assessed - Grooming: Sitting, bed Upper Body Bathing: Simulated;Chest;Right arm;Left arm;Abdomen Where Assessed - Upper Body Bathing: Sitting, bed Lower Body Bathing: Simulated;Maximal assistance Lower Body Bathing Details (indicate cue type and reason): Pt. unable to reach LB Where Assessed - Lower Body Bathing: Sit to stand from bed Upper Body Dressing: Performed;Minimal assistance Upper Body Dressing Details (indicate cue type and reason): with donning gown Where Assessed - Upper Body Dressing: Sitting, bed Lower Body Dressing: Simulated;Maximal assistance Lower Body Dressing Details (indicate cue type and reason): Pt. unable to reach bil LE Where Assessed - Lower Body Dressing: Sit to stand from bed Toilet Transfer: Simulated;+2 Total assistance;Comment for patient % (pt=50%) Toilet Transfer Details (indicate cue type and reason): Mod verbal cues for hand placement and technique Toilet Transfer Method: Stand pivot Toilet Transfer Equipment: Other (comment) (recliner) Toileting - Clothing Manipulation: Simulated;Moderate assistance Where Assessed - Toileting Clothing Manipulation: Standing Toileting - Hygiene: Simulated;Maximal assistance Where Assessed - Toileting Hygiene: Standing Tub/Shower Transfer: Not assessed Tub/Shower Transfer Method: Not assessed Equipment Used: Rolling walker ADL Comments: Pt. educated on use of AE for LB ADLs and need for increased rehab prior to D/C home.  Vision/Perception  Vision - History Baseline Vision: Wears glasses all the time Patient Visual Report: No change from  baseline Vision - Assessment Eye Alignment: Within Functional Limits Vision Assessment: Vision not tested Cognition Cognition Arousal/Alertness: Awake/alert Overall Cognitive Status: Difficult to assess Difficult to assess due to: level of arousal (very distracted by pain/sleepy with pain meds) Orientation Level: Oriented X4 Cognition - Other Comments: arousable, though eyes drift closed; very painful with decr attention to education; very internally distracted by pain Sensation/Coordination Sensation Light Touch: Appears Intact Coordination Gross Motor Movements are Fluid and Coordinated: No Fine Motor Movements are Fluid and Coordinated: Not tested Coordination and Movement Description: limited by body habitus and pain Extremity Assessment RUE Assessment RUE Assessment: Within Functional Limits LUE Assessment LUE Assessment: Within Functional Limits Mobility  Bed Mobility Bed Mobility: Yes Supine to Sit: 1: +2 Total assist;Patient percentage (comment);HOB elevated (Comment degrees);With rails (pt=50%) Supine to Sit Details (indicate cue type and reason): good ability to follow simple one-step commands; required physical assist to support RLE Transfers Transfers: Yes Sit to Stand: 1: +2 Total assist;Patient percentage (comment);From elevated surface;From bed (pt=45%) Sit to Stand Details (indicate cue type and reason): 2 unsuccessful attempts from bed, then elevated bed; pt dependent on momentum; required cues/instruction re: foot positioning L and Right (pt very concerned that Right foot was not under her, but explained KI was on) Stand to Sit: 1: +2 Total assist;Patient percentage (comment);To chair/3-in-1 (pt=50%) Stand to Sit Details: max cues for safety, UE placement, cues to control descent   End of Session OT - End of Session Equipment Utilized During Treatment: Gait belt;Right knee immobilizer Activity Tolerance: Patient limited by pain Patient left: in chair;with  call bell in reach;with family/visitor present Nurse Communication: Mobility status for transfers General Behavior During Session: Flat affect Cognition: Impaired Cognitive Impairment: very sleepy with pain meds during eval, and, once moving, very internally distracted by pain; will need much reinforcement of education related to mobility/therex  Cassandria Anger, OTR/L Pager [phone] 07/29/2011, 12:34 PM

## 2011-07-29 NOTE — Progress Notes (Signed)
Physical Therapy Treatment Patient Details Name: Leah Shah MRN: [id number] DOB: [birth year] Today's Date: 07/29/2011  PT Assessment/Plan  PT - Assessment/Plan Comments on Treatment Session: Improved participation, but very painful; discussed likelihood of need for SNF for rehab to maximize independence and safety with mobility in order to dc home PT Plan: Discharge plan remains appropriate PT Frequency: 7X/week Follow Up Recommendations: Skilled nursing facility Equipment Recommended: Defer to next venue PT Goals  Acute Rehab PT Goals Time For Goal Achievement: 7 days Pt will go Supine/Side to Sit: with supervision PT Goal: Supine/Side to Sit - Progress: Other (comment) Pt will go Sit to Supine/Side: with supervision PT Goal: Sit to Supine/Side - Progress: Other (comment) Pt will go Sit to Stand: with supervision PT Goal: Sit to Stand - Progress: Other (comment) Pt will go Stand to Sit: with supervision PT Goal: Stand to Sit - Progress: Other (comment) Pt will Transfer Bed to Chair/Chair to Bed: with supervision PT Transfer Goal: Bed to Chair/Chair to Bed - Progress: Other (comment) Pt will Ambulate: 51 - 150 feet;with supervision;with rolling walker PT Goal: Ambulate - Progress: Other (comment) Pt will Go Up / Down Stairs: 3-5 stairs;with min assist;with rolling walker PT Goal: Up/Down Stairs - Progress: Other (comment) Pt will Perform Home Exercise Program: Independently PT Goal: Perform Home Exercise Program - Progress: Progressing toward goal (very slowly)  PT Treatment Precautions/Restrictions  Precautions Precautions: Knee Required Braces or Orthoses: Yes Knee Immobilizer: On except when in CPM Restrictions Weight Bearing Restrictions: Yes RLE Weight Bearing: Weight bearing as tolerated Mobility (including Balance)      Exercise  Total Joint Exercises Ankle Circles/Pumps: AROM;Both;20 reps;Supine Quad Sets: AROM;10 reps;Right;Supine (quad activation present, but  minimal) Short Arc Quad: AAROM;Right;5 reps;Supine (did not tolerate flexion component of Short Arc Quad) Heel Slides: AAROM;Right;10 reps;Supine (flex to approx30 deg; very painful) Straight Leg Raises: AAROM;Right;10 reps;Supine End of Session PT - End of Session Activity Tolerance: Patient limited by pain Patient left: in bed;with call bell in reach;with family/visitor present General Behavior During Session: [facility] for tasks performed (a bit self-limiting) Cognition: [facility] for tasks performed  Olen Pel [place], [place] [phone]  07/29/2011, 4:59 PM

## 2011-07-29 NOTE — Progress Notes (Signed)
Subjective: My knee hurts   Objective: Vital signs in last 24 hours: Temp:  [97.2 F (36.2 C)-98.8 F (37.1 C)] 98 F (36.7 C) (01/23 0536) Pulse Rate:  [62-76] 75  (01/23 0536) Resp:  [10-22] 18  (01/23 0536) BP: (114-138)/(60-74) 114/60 mmHg (01/23 0536) SpO2:  [90 %-96 %] 91 % (01/23 0536) Weight:  [139.799 kg (308 lb 3.2 oz)] 139.799 kg (308 lb 3.2 oz) (01/22 1528)  Intake/Output from previous day: 01/22 0701 - 01/23 0700 In: 3270 [P.O.:120; I.V.:3100; IV Piggyback:50] Out: 1425 [Urine:1000; Drains:350; Blood:75] Intake/Output this shift:    Exam:  Neurovascular intact Sensation intact distally Intact pulses distally Dorsiflexion/Plantar flexion intact  Labs:  Basename 07/29/11 0547  HGB 11.5*    Basename 07/29/11 0547  WBC 12.9*  RBC 3.35*  HCT 36.7  PLT 184    Basename 07/29/11 0547  NA 140  K 5.2*  CL 104  CO2 31  BUN 8  CREATININE 0.57  GLUCOSE 132*  CALCIUM 8.3*    Basename 07/29/11 0547  LABPT --  INR 1.19    Assessment/Plan: Pt stable - mobilize today - hv dced - recheck k am - needs cpm for rom   Leah Shah 07/29/2011, 7:30 AM

## 2011-07-29 NOTE — Progress Notes (Signed)
ANTICOAGULATION CONSULT NOTE -Follow up  Pharmacy Consult for Coumadin Indication: VTE prophylaxis  Allergies  Allergen Reactions  . Codeine     Itching "she can take some forms of it"    Patient Measurements: Height: 5\' 8"  (172.7 cm) (from preop entry) Weight: 308 lb 3.2 oz (139.799 kg) (from preop 07/20/11 entry) IBW/kg (Calculated) : 63.9    Vital Signs: Temp: 98 F (36.7 C) (01/23 0536) Temp src: Oral (01/23 0536) BP: 114/60 mmHg (01/23 0536) Pulse Rate: 75  (01/23 0536)  Labs: preop labs 07/20/11  WBC 10.3, Hgb 13.4, Hct 40.6, PLTC 187, SCr 0.51  Basename 07/29/11 0547  HGB 11.5*  HCT 36.7  PLT 184  APTT --  LABPROT 15.4*  INR 1.19  HEPARINUNFRC --  CREATININE 0.57  CKTOTAL --  CKMB --  TROPONINI --   Estimated Creatinine Clearance: 116.9 ml/min (by C-G formula based on Cr of 0.57).  Medical History: Past Medical History  Diagnosis Date  . Heart valve problem     was on Redux and it caused some "heart valve damage"-does not see cardiologist  . Shortness of breath     with exertion  . Headache   . Anxiety   . Recurrent upper respiratory infection (URI)     treated /w OTC med.   . Arthritis     R knee, L knee - OA  . Hypertension     echoGavin Potters, wnl 2009? , had taken Redux for diet management  but now  off the market. pt, told that echo wnl.      Medications:  Prescriptions prior to admission  Medication Sig Dispense Refill  . atenolol (TENORMIN) 100 MG tablet Take 100 mg by mouth daily at 2 PM daily at 2 PM.       . cyclobenzaprine (FLEXERIL) 10 MG tablet Take 10 mg by mouth at bedtime. scheduled       . hydrOXYzine (ATARAX/VISTARIL) 25 MG tablet Take 50 mg by mouth at bedtime. scheduled       . losartan (COZAAR) 50 MG tablet Take 50 mg by mouth daily at 2 PM daily at 2 PM.       . oxyCODONE-acetaminophen (PERCOCET) 7.5-325 MG per tablet Take 1 tablet by mouth 2 (two) times daily as needed. For pain.       . simvastatin (ZOCOR) 40 MG tablet  Take 40 mg by mouth at bedtime.        . traZODone (DESYREL) 150 MG tablet Take 150 mg by mouth at bedtime.       [name] alprazolam (XANAX) 2 MG tablet Take 1 mg by mouth at bedtime as needed.      . Multiple Vitamin (MULTIVITAMIN PO) Take 1 tablet by mouth daily. Alive multivitamin (otc)        Scheduled:     . 0.9 % NaCl with KCl 20 mEq / L   Intravenous To PACU  . atenolol  100 mg Oral Q1400  .  ceFAZolin (ANCEF) IV  1 g Intravenous Q8H  . coumadin book   Does not apply Once  . cyclobenzaprine  10 mg Oral QHS  . hydrOXYzine  50 mg Oral QHS  . losartan  50 mg Oral Q1400  . morphine   Intravenous Q4H  . morphine   Intravenous To PACU  . senna  1 tablet Oral BID  . simvastatin  40 mg Oral QHS  . traZODone  150 mg Oral QHS  . warfarin  7.5 mg Oral ONCE-1800  .  warfarin   Does not apply Once  . DISCONTD:  ceFAZolin (ANCEF) IV  2 g Intravenous 60 min Pre-Op  . DISCONTD:  ceFAZolin (ANCEF) IV  2 g Intravenous 60 min Pre-Op  . DISCONTD: chlorhexidine  60 mL Topical Once  . DISCONTD: cloNIDine  150 mcg Epidural Once    Assessment: 56 yo female POD # 1 s/p right TKA due to right knee osteo- arthritis. On Coumadin for VTE prophylaxis. INR 1.19. No bleeding reported.   Goal of Therapy:  INR 2-3   Plan:  Coumadin 7.5mg  po tonight.  INR daily.    Arman Filter 07/29/2011,11:14 AM

## 2011-07-29 NOTE — Progress Notes (Signed)
CARE MANAGEMENT NOTE 07/29/2011 Discharge planning. Spoke with patient and family, choice offered. Nop preference. Patient agreeable with [facility]. CPM, rolling walker and 3in1 have been delivered to the home.

## 2011-07-29 NOTE — Plan of Care (Signed)
Problem: Consults Goal: Diagnosis- Total Joint Replacement R total knee replacement. Patient is is CPM 0-40 degrees and is resting comfortably. Will continue to monitor

## 2011-07-29 NOTE — Progress Notes (Signed)
Physical Therapy Evaluation Patient Details Name: Leah Shah MRN: [id number] DOB: 1955/10/26 Today's Date: 07/29/2011  Problem List: There is no problem list on file for this patient.   Past Medical History:  Past Medical History  Diagnosis Date  . Heart valve problem     was on Redux and it caused some "heart valve damage"-does not see cardiologist  . Shortness of breath     with exertion  . Headache   . Anxiety   . Recurrent upper respiratory infection (URI)     treated /w OTC med.   . Arthritis     R knee, L knee - OA  . Hypertension     echoGavin Potters, wnl 2009? , had taken Redux for diet management  but now  off the market. pt, told that echo wnl.     Past Surgical History:  Past Surgical History  Procedure Date  . Abdominal hysterectomy     partial-uterus removed  . Tmj arthroplasty     [facility]- 1997  . Heel spur surgery   . Knee arthroscopy     right knee  . Coccyx removal     [facility]  . Eye surgery     states both lens replaced. Not sure if cataract surgery or not.  . Cholecystectomy     2011    PT Assessment/Plan/Recommendation PT Assessment Clinical Impression Statement:  56yo femal s/p RTKA presents with decr functional mobility and impairments listed below; will benefit from acute PT services to maximize independence and safety with mobility/amb/steps/therex/ and to facilitate dc planning;  Will need to make significant progress in order to be able to dc home;  Must consider SNF for rehab PT Recommendation/Assessment: Patient will need skilled PT in the acute care venue PT Problem List: Decreased strength;Decreased range of motion;Decreased activity tolerance;Decreased balance;Decreased mobility;Decreased coordination;Decreased cognition;Decreased knowledge of use of DME;Decreased safety awareness;Decreased knowledge of precautions;Obesity;Pain Barriers to Discharge: Decreased caregiver support PT Therapy Diagnosis : Difficulty walking;Abnormality of  gait;Generalized weakness;Acute pain PT Plan PT Frequency: 7X/week PT Treatment/Interventions: DME instruction;Gait training;Stair training;Functional mobility training;Therapeutic activities;Therapeutic exercise;Patient/family education PT Recommendation Recommendations for Other Services: OT consult Follow Up Recommendations: Skilled nursing facility;Home health PT;Supervision/Assistance - 24 hour Equipment Recommended: Defer to next venue PT Goals  Acute Rehab PT Goals PT Goal Formulation: With patient/family Time For Goal Achievement: 7 days Pt will go Supine/Side to Sit: with supervision PT Goal: Supine/Side to Sit - Progress: Goal set today Pt will go Sit to Supine/Side: with supervision PT Goal: Sit to Supine/Side - Progress: Goal set today Pt will go Sit to Stand: with supervision PT Goal: Sit to Stand - Progress: Goal set today Pt will go Stand to Sit: with supervision PT Goal: Stand to Sit - Progress: Goal set today Pt will Transfer Bed to Chair/Chair to Bed: with supervision PT Transfer Goal: Bed to Chair/Chair to Bed - Progress: Goal set today Pt will Ambulate: 51 - 150 feet;with supervision;with rolling walker PT Goal: Ambulate - Progress: Goal set today Pt will Go Up / Down Stairs: 3-5 stairs;with min assist;with rolling walker PT Goal: Up/Down Stairs - Progress: Goal set today Pt will Perform Home Exercise Program: Independently PT Goal: Perform Home Exercise Program - Progress: Goal set today  PT Evaluation Precautions/Restrictions  Precautions Precautions: Knee Required Braces or Orthoses: Yes Knee Immobilizer: On except when in CPM Restrictions Weight Bearing Restrictions: Yes RLE Weight Bearing: Weight bearing as tolerated Prior Functioning  Home Living Lives With: Spouse;Other (Comment) (spouse works for  weeks at a time, will be back for recovery) Type of Home: House Home Layout: One level Home Access: Stairs to enter Entrance Stairs-Rails:  None Entrance Stairs-Number of Steps: 3 Bathroom Shower/Tub: Forensic scientist: Standard Home Adaptive Equipment: Walker - rolling;Bedside commode/3-in-1;Straight cane Prior Function Level of Independence: Independent with basic ADLs;Independent with transfers;Independent with gait Able to Take Stairs?: Yes Driving: Yes Vocation: On disability Cognition Cognition Arousal/Alertness: Awake/alert Overall Cognitive Status: Difficult to assess Difficult to assess due to: level of arousal (very distracted by pain/sleepy with pain meds) Orientation Level: Oriented X4 Cognition - Other Comments: arousable, though eyes drift closed; very painful with decr attention to education; very internally distracted by pain Sensation/Coordination Sensation Light Touch: Appears Intact Coordination Gross Motor Movements are Fluid and Coordinated: No Fine Motor Movements are Fluid and Coordinated: Not tested Coordination and Movement Description: limited by body habitus and pain Extremity Assessment RUE Assessment RUE Assessment: Within Functional Limits LUE Assessment LUE Assessment: Within Functional Limits RLE Assessment RLE Assessment: Exceptions to [facility] RLE Strength RLE Overall Strength Comments: Decr AROM and strength, limited by pain; AAROM in flexion very limited with muscle guarding (flex to approx 15deg); quad activation minimally present LLE Assessment LLE Assessment: Exceptions to [facility] LLE Strength LLE Overall Strength Comments: grossly decr strength, requiring plus 2 physical assist for sit to stand Mobility (including Balance) Bed Mobility Bed Mobility: Yes Supine to Sit: 1: +2 Total assist;Patient percentage (comment);HOB elevated (Comment degrees);With rails (pt=50%) Supine to Sit Details (indicate cue type and reason): good ability to follow simple one-step commands; required physical assist to support RLE Transfers Transfers: Yes Sit to Stand: 1: +2 Total  assist;Patient percentage (comment);From elevated surface;From bed (pt=45%) Sit to Stand Details (indicate cue type and reason): 2 unsuccessful attempts from bed, then elevated bed; pt dependent on momentum; required cues/instruction re: foot positioning L and Right (pt very concerned that Right foot was not under her, but explained KI was on) Stand to Sit: 1: +2 Total assist;Patient percentage (comment);To chair/3-in-1 (pt=50%) Stand to Sit Details: max cues for safety, UE placement, cues to control descent Ambulation/Gait Ambulation/Gait: Yes Ambulation/Gait Assistance: 1: +2 Total assist;Patient percentage (comment) (pt=50%) Ambulation/Gait Assistance Details (indicate cue type and reason): pivotal steps bed to chair with RW; max cues for sequence; physical assist for RW movement/management, and to advance RLE Ambulation Distance (Feet): 2 Feet Assistive device: Rolling walker Gait Pattern: Decreased step length - right;Decreased step length - left;Left steppage;Antalgic    Exercise  Total Joint Exercises Ankle Circles/Pumps: AROM;Both;5 reps;Supine (sleepy) Quad Sets: AROM;Right;10 reps;Supine (minimal contraction, if any) Heel Slides: AAROM;Right;5 reps;Supine (extremely limited by pain) End of Session PT - End of Session Equipment Utilized During Treatment: Gait belt;Right knee immobilizer Activity Tolerance: Patient limited by pain Patient left: in chair;with call bell in reach;with family/visitor present Nurse Communication: Mobility status for transfers General Behavior During Session: Flat affect Cognition: Impaired Cognitive Impairment: very sleepy with pain meds during eval, and, once moving, very internally distracted by pain; will need much reinforcement of education related to mobility/therex  Van Clines [facility] [place],  [phone]  07/29/2011, 10:21 AM

## 2011-07-30 LAB — CBC
MCH: 35.1 pg — ABNORMAL HIGH (ref 26.0–34.0)
MCHC: 31.9 g/dL (ref 30.0–36.0)
MCV: 109.8 fL — ABNORMAL HIGH (ref 78.0–100.0)
Platelets: 138 10*3/uL — ABNORMAL LOW (ref 150–400)
RDW: 13.8 % (ref 11.5–15.5)

## 2011-07-30 LAB — BASIC METABOLIC PANEL
Calcium: 7.8 mg/dL — ABNORMAL LOW (ref 8.4–10.5)
Creatinine, Ser: 0.47 mg/dL — ABNORMAL LOW (ref 0.50–1.10)
GFR calc Af Amer: >90 mL/min (ref >90)

## 2011-07-30 MED ORDER — WARFARIN SODIUM 2.5 MG PO TABS
2.5000 mg | ORAL_TABLET | Freq: Once | ORAL | Status: AC
  Filled 2011-07-30: qty 1

## 2011-07-30 NOTE — Progress Notes (Signed)
Physical Therapy Treatment Patient Details Name: Leah Shah MRN: [id number] DOB: [birth year] Today's Date: 07/30/2011  PT Assessment/Plan  PT - Assessment/Plan Comments on Treatment Session: Pt. hindered by her fear and by pain level but overall made progress today. Agree with need for SNF before DC home. PT Plan: Discharge plan remains appropriate PT Frequency: 7X/week Follow Up Recommendations: Skilled nursing facility Equipment Recommended: Defer to next venue PT Goals  Acute Rehab PT Goals PT Goal: Supine/Side to Sit - Progress: Progressing toward goal PT Goal: Sit to Stand - Progress: Progressing toward goal PT Goal: Stand to Sit - Progress: Progressing toward goal PT Goal: Ambulate - Progress: Progressing toward goal PT Goal: Perform Home Exercise Program - Progress: Progressing toward goal  PT Treatment Precautions/Restrictions  Precautions Precautions: Knee Precaution Comments: reviewed no pillow under knee precaution with pt. Required Braces or Orthoses: Yes Knee Immobilizer: On except when in CPM Restrictions Weight Bearing Restrictions: Yes RLE Weight Bearing: Weight bearing as tolerated Mobility (including Balance) Bed Mobility Bed Mobility: Yes Supine to Sit: 1: +2 Total assist;Patient percentage (comment) (pt. 40%) Supine to Sit Details (indicate cue type and reason): sequential cues for moving EOB and for encouragement Transfers Sit to Stand: 1: +2 Total assist;Patient percentage (comment) (pt. 50%) Sit to Stand Details (indicate cue type and reason): vc's for hand placement and for safe technique to stand, bed elevated Stand to Sit: 1: +2 Total assist;Patient percentage (comment) (pt. 60 %) Stand to Sit Details: cueing for hand placement , leg placement and safe technique for descent Ambulation/Gait Ambulation/Gait: Yes Ambulation Distance (Feet): 20 Feet Assistive device: Rolling walker Gait Pattern: Step-to pattern;Decreased step length - right      Exercise  Total Joint Exercises Ankle Circles/Pumps: AROM;Both;10 reps;Supine Quad Sets: AROM;Right;10 reps;Supine Short Arc Quad: Right;AAROM;5 reps Knee Flexion: AAROM;Right;5 reps;Seated (knee flex 45 degrees) End of Session PT - End of Session Equipment Utilized During Treatment: Gait belt;Right knee immobilizer Activity Tolerance: Patient limited by pain (pt. limited by fear of falling) Patient left: in chair;with call bell in reach;with family/visitor present (son and daughter present) General Behavior During Session: [facility] for tasks performed Cognition: [facility] for tasks performed  Ferman Hamming 07/30/2011, 1:37 PM Acute Rehabilitation Services [phone] (pager)

## 2011-07-30 NOTE — Progress Notes (Signed)
ANTICOAGULATION CONSULT NOTE -Follow up  Pharmacy Consult for Coumadin Indication: VTE prophylaxis  Allergies  Allergen Reactions  . Codeine     Itching "she can take some forms of it"    Patient Measurements: Height: 5\' 8"  (172.7 cm) (from preop entry) Weight: 308 lb 3.2 oz (139.799 kg) (from preop 07/20/11 entry) IBW/kg (Calculated) : 63.9    Vital Signs: Temp: 98.4 F (36.9 C) (01/24 0520) BP: 114/66 mmHg (01/24 0520) Pulse Rate: 76  (01/24 0520)  Labs: preop labs 07/20/11  WBC 10.3, Hgb 13.4, Hct 40.6, PLTC 187, SCr 0.51  Basename 07/30/11 0510 07/29/11 0547  HGB 10.0* 11.5*  HCT 31.3* 36.7  PLT 138* 184  APTT -- --  LABPROT 22.5* 15.4*  INR 1.94* 1.19  HEPARINUNFRC -- --  CREATININE -- 0.57  CKTOTAL -- --  CKMB -- --  TROPONINI -- --   Estimated Creatinine Clearance: 116.9 ml/min (by C-G formula based on Cr of 0.57).  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 On Coumadin for VTE prophylaxis. INR up to 1.94 (large increase past 24 hours past 2 doses of 7.5mg /daily). No bleeding reported.   Goal of Therapy:  INR 2-3   Plan:  Coumadin 2.5mg  po tonight.  INR daily.    Lavonia Dana 07/30/2011,9:56 AM

## 2011-07-30 NOTE — Progress Notes (Signed)
Physical Therapy Treatment Patient Details Name: Leah Shah MRN: [id number] DOB: 01-09-1956 Today's Date: 07/30/2011  PT Assessment/Plan  PT - Assessment/Plan Comments on Treatment Session: Continuing to progress this pm.  Did not appear as fearful. PT Plan: Discharge plan remains appropriate PT Frequency: 7X/week Follow Up Recommendations: Skilled nursing facility Equipment Recommended: Defer to next venue PT Goals  Acute Rehab PT Goals PT Goal: Supine/Side to Sit - Progress: Progressing toward goal PT Goal: Sit to Supine/Side - Progress: Progressing toward goal PT Goal: Sit to Stand - Progress: Progressing toward goal PT Goal: Stand to Sit - Progress: Progressing toward goal PT Goal: Ambulate - Progress: Progressing toward goal PT Goal: Perform Home Exercise Program - Progress: Progressing toward goal  PT Treatment Precautions/Restrictions  Precautions Precautions: Knee Precaution Comments: reviewed no pillow under knee precaution with pt. Required Braces or Orthoses: Yes Knee Immobilizer: On except when in CPM Restrictions Weight Bearing Restrictions: Yes RLE Weight Bearing: Weight bearing as tolerated Mobility (including Balance) Bed Mobility Bed Mobility: Yes Supine to Sit: 1: +2 Total assist;Patient percentage (comment) (pt. 40%) Supine to Sit Details (indicate cue type and reason): sequential cues for moving EOB and for encouragement Sit to Supine: 1: +2 Total assist;Patient percentage (comment) (pt. 75%) Sit to Supine - Details (indicate cue type and reason): cues fot lowering upper body Transfers Transfers: Yes Sit to Stand: 1: +2 Total assist;Patient percentage (comment) (pt. 60%) Sit to Stand Details (indicate cue type and reason): cues for hand placement and for safe technique Stand to Sit: 3: Mod assist Stand to Sit Details: mod assist for control of descent Ambulation/Gait Ambulation/Gait: Yes Ambulation/Gait Assistance: 1: +2 Total assist;Patient  percentage (comment) (pt. 70%, able to move at faster pace this pm) Ambulation/Gait Assistance Details (indicate cue type and reason): vc's for sequence, step length and cues for advancing RW Ambulation Distance (Feet): 15 Feet Assistive device: Rolling walker Gait Pattern: Step-to pattern;Decreased step length - right Gait velocity: decreased    Exercise  Total Joint Exercises Ankle Circles/Pumps: AROM;Both;10 reps;Supine Quad Sets: AROM;Right;10 reps;Supine Short Arc Quad: Right;AAROM;5 reps Knee Flexion: AAROM;Right;5 reps;Seated (knee flex 45 degrees) End of Session PT - End of Session Equipment Utilized During Treatment: Gait belt;Right knee immobilizer Activity Tolerance: Patient limited by fatigue (less fear of falling this pm) Patient left: in bed;with call bell in reach;with family/visitor present (encouraged pt. to ask to get OOB for dinner) Nurse Communication: Mobility status for transfers;Mobility status for ambulation;Weight bearing status General Behavior During Session: [facility] for tasks performed Cognition: [facility] for tasks performed Cognitive Impairment: less fearful; fully awake and following directions  Ferman Hamming 07/30/2011, 3:03 PM Acute Rehabilitation Services [phone] (pager)

## 2011-07-30 NOTE — Progress Notes (Signed)
Subjective: Pt stable was oob yesterday   Objective: Vital signs in last 24 hours: Temp:  [98.2 F (36.8 C)-99.2 F (37.3 C)] 98.4 F (36.9 C) (01/24 0520) Pulse Rate:  [67-77] 76  (01/24 0520) Resp:  [12-20] 14  (01/24 0802) BP: (107-116)/(66-77) 114/66 mmHg (01/24 0520) SpO2:  [92 %-95 %] 94 % (01/24 0802)  Intake/Output from previous day: 01/23 0701 - 01/24 0700 In: 600 [P.O.:600] Out: 1250 [Urine:1250] Intake/Output this shift:    Exam:  Sensation intact distally Intact pulses distally Dorsiflexion/Plantar flexion intact  Labs:  Basename 07/30/11 0510 07/29/11 0547  HGB 10.0* 11.5*    Basename 07/30/11 0510 07/29/11 0547  WBC 10.1 12.9*  RBC 2.85* 3.35*  HCT 31.3* 36.7  PLT 138* 184    Basename 07/29/11 0547  NA 140  K 5.2*  CL 104  CO2 31  BUN 8  CREATININE 0.57  GLUCOSE 132*  CALCIUM 8.3*    Basename 07/30/11 0510 07/29/11 0547  LABPT -- --  INR 1.94* 1.19    Assessment/Plan: Pt slow to mobilize = dc pca - dc ivf - cpm to progress - change dressing am   Leah Shah 07/30/2011, 9:02 AM

## 2011-07-31 LAB — CBC
MCH: 34.6 pg — ABNORMAL HIGH (ref 26.0–34.0)
MCHC: 32.4 g/dL (ref 30.0–36.0)
Platelets: 159 10*3/uL (ref 150–400)

## 2011-07-31 LAB — PROTIME-INR: Prothrombin Time: 23.2 seconds — ABNORMAL HIGH (ref 11.6–15.2)

## 2011-07-31 MED ORDER — WARFARIN SODIUM 2.5 MG PO TABS: 2.5000 mg | ORAL_TABLET | Freq: Once | ORAL | Status: DC

## 2011-07-31 MED ORDER — OXYCODONE HCL 5 MG PO TABS: 5.0000 mg | ORAL_TABLET | ORAL | Status: AC | PRN

## 2011-07-31 MED ORDER — WARFARIN SODIUM 4 MG PO TABS
4.0000 mg | ORAL_TABLET | Freq: Once | ORAL | Status: AC
  Administered 2011-07-31: 4 mg via ORAL
  Filled 2011-07-31 (×2): qty 1

## 2011-07-31 NOTE — Progress Notes (Signed)
Physical Therapy Treatment Patient Details Name: Leah Shah MRN: [id number] DOB: 11/15/1955 Today's Date: 07/31/2011  PT Assessment/Plan  PT - Assessment/Plan Comments on Treatment Session: Pt. making steady gains, but needs lots of encouragement. PT Plan: Discharge plan remains appropriate PT Frequency: 7X/week Follow Up Recommendations: Skilled nursing facility Equipment Recommended: Defer to next venue PT Goals  Acute Rehab PT Goals PT Goal: Supine/Side to Sit - Progress: Progressing toward goal PT Goal: Sit to Stand - Progress: Progressing toward goal PT Goal: Stand to Sit - Progress: Progressing toward goal PT Goal: Ambulate - Progress: Progressing toward goal PT Goal: Perform Home Exercise Program - Progress: Progressing toward goal  PT Treatment Precautions/Restrictions  Precautions Precautions: Knee Precaution Comments: reviewed no pillow under knee precaution with pt. Required Braces or Orthoses: Yes Knee Immobilizer: On except when in CPM Restrictions Weight Bearing Restrictions: Yes RLE Weight Bearing: Weight bearing as tolerated Mobility (including Balance) Bed Mobility Bed Mobility: Yes Supine to Sit: 3: Mod assist;HOB flat Supine to Sit Details (indicate cue type and reason): heavy mod assist and sequence cues for moving to EOB; pad assist to scoot ot EOB Sit to Supine: Not Tested (comment) Transfers Transfers: Yes Sit to Stand: 3: Mod assist Sit to Stand Details (indicate cue type and reason): frequent cues for hand placement.  Pt. tends to attempt to pull up from RW. Stand to Sit: 3: Mod assist Stand to Sit Details: mod assist to control descent and for hand placement cues. Ambulation/Gait Ambulation/Gait: Yes Ambulation/Gait Assistance: 4: Min assist (second person to push recliner behind pt.) Ambulation/Gait Assistance Details (indicate cue type and reason): sequence cues to lead with right LE Ambulation Distance (Feet): 50 Feet (30 feet then 20 with  seated rest) Assistive device: Rolling walker Gait Pattern: Step-to pattern    Exercise  Total Joint Exercises Ankle Circles/Pumps: AROM;Both;10 reps;Supine Quad Sets: AROM;Right;10 reps;Supine (0 degrees extension) Short Arc Quad: AAROM;Right;10 reps;Supine Knee Flexion: AAROM;Right;Seated;5 reps (knee flex 65 degrees AA) End of Session PT - End of Session Equipment Utilized During Treatment: Gait belt;Right knee immobilizer Activity Tolerance: Patient tolerated treatment well (tolerance improving) Patient left: in chair;with call bell in reach Nurse Communication: Mobility status for transfers;Mobility status for ambulation;Weight bearing status General Behavior During Session: [facility] for tasks performed Cognition: [facility] for tasks performed  Ferman Hamming 07/31/2011, 1:42 PM Acute Rehabilitation Services [phone] (pager)

## 2011-07-31 NOTE — Progress Notes (Signed)
Pt. Just back to bed and declines PT this afternoon due to fatigue.  Will proceed with PT tomorrow.  Donnalee [facility] [phone]

## 2011-07-31 NOTE — Progress Notes (Signed)
Admit Complaint: right knee pain/osteoarthritis failed conserv. Tx.  Anticoag- pod#3 s/p R TKA,Coumadin for VTE px; 55yo obese female, warf points=5, goal INR 2-3;INR up to 2.02 (it seems like 2.5mg  dose was not given last night by RN); Hgb up to 11; SNF Monday -------  1) Coumadin 4mg  po x1; 2) INR in am

## 2011-07-31 NOTE — Progress Notes (Signed)
Subjective: Pt stable - slow progress with pt   Objective: Vital signs in last 24 hours: Temp:  [98.7 F (37.1 C)-99.1 F (37.3 C)] 98.8 F (37.1 C) (01/25 0558) Pulse Rate:  [81-94] 94  (01/25 0558) Resp:  [14-20] 20  (01/25 0558) BP: (123-135)/(51-72) 123/61 mmHg (01/25 0558) SpO2:  [91 %-97 %] 91 % (01/25 0558)  Intake/Output from previous day: 01/24 0701 - 01/25 0700 In: 480 [P.O.:480] Out: 1200 [Urine:1200] Intake/Output this shift:    Exam:  Sensation intact distally Intact pulses distally Dorsiflexion/Plantar flexion intact  Labs:  Basename 07/31/11 0555 07/30/11 0510 07/29/11 0547  HGB 11.0* 10.0* 11.5*    Basename 07/31/11 0555 07/30/11 0510  WBC 9.4 10.1  RBC 3.18* 2.85*  HCT 33.9* 31.3*  PLT 159 138*    Basename 07/30/11 0922 07/29/11 0547  NA 139 140  K 4.6 5.2*  CL 104 104  CO2 29 31  BUN 7 8  CREATININE 0.47* 0.57  GLUCOSE 157* 132*  CALCIUM 7.8* 8.3*    Basename 07/31/11 0555 07/30/11 0510  LABPT -- --  INR 2.02* 1.94*    Assessment/Plan: inr therapeutic - change dressing today - snf Monday - cont cpm   Leah Shah 07/31/2011, 7:07 AM

## 2011-08-01 LAB — PROTIME-INR: Prothrombin Time: 23 seconds — ABNORMAL HIGH (ref 11.6–15.2)

## 2011-08-01 MED ORDER — WARFARIN SODIUM 4 MG PO TABS
4.0000 mg | ORAL_TABLET | Freq: Once | ORAL | Status: DC
  Filled 2011-08-01: qty 1

## 2011-08-01 MED ORDER — WARFARIN SODIUM 4 MG PO TABS: 4.0000 mg | ORAL_TABLET | Freq: Once | ORAL | Status: DC

## 2011-08-01 NOTE — Progress Notes (Signed)
Physical Therapy Treatment Patient Details Name: Leah Shah MRN: [id number] DOB: 14-Dec-1955 Today's Date: 08/01/2011  PT Assessment/Plan  PT - Assessment/Plan Comments on Treatment Session: s/p R TKA. Pt progressing well with mobility and ambulation PT Plan: Discharge plan remains appropriate PT Frequency: 7X/week Follow Up Recommendations: Skilled nursing facility Equipment Recommended: Defer to next venue PT Goals  Acute Rehab PT Goals PT Goal: Supine/Side to Sit - Progress: Met PT Goal: Sit to Supine/Side - Progress: Progressing toward goal PT Goal: Sit to Stand - Progress: Progressing toward goal PT Goal: Stand to Sit - Progress: Progressing toward goal PT Goal: Ambulate - Progress: Progressing toward goal PT Goal: Perform Home Exercise Program - Progress: Progressing toward goal  PT Treatment Precautions/Restrictions  Precautions Precautions: Knee Precaution Comments: reviewed no pillow under knee precaution with pt. Required Braces or Orthoses: Yes Knee Immobilizer: On except when in CPM Restrictions Weight Bearing Restrictions: Yes RLE Weight Bearing: Weight bearing as tolerated Mobility (including Balance) Bed Mobility Supine to Sit: 5: Supervision;With rails;HOB elevated (Comment degrees) (30) Supine to Sit Details (indicate cue type and reason): cues for safety. Heavy reliance on rails.  Transfers Sit to Stand: 4: Min assist Sit to Stand Details (indicate cue type and reason): cues for hand placement.  Stand to Sit: 4: Min assist Stand to Sit Details: A to control descent into chair.  Ambulation/Gait Ambulation/Gait Assistance: Other (comment) (MinGuard A) Ambulation/Gait Assistance Details (indicate cue type and reason): Cues for gait sequence Ambulation Distance (Feet): 90 Feet Assistive device: Rolling walker Gait Pattern: Step-to pattern;Decreased stride length;Trunk flexed    Exercise  Total Joint Exercises Ankle Circles/Pumps: AROM;Right;10  reps Quad Sets: AROM;Right;10 reps Short Arc QuadBarbaraann Boys;Right;10 reps Heel Slides: AAROM;Right;10 reps Hip ABduction/ADduction: AAROM;Right;10 reps Straight Leg Raises: AAROM;Right;10 reps End of Session PT - End of Session Equipment Utilized During Treatment: Gait belt;Right knee immobilizer Activity Tolerance: Patient tolerated treatment well Patient left: in chair;with call bell in reach General Behavior During Session: [facility] for tasks performed Cognition: [facility] for tasks performed  Fredrich Birks 08/01/2011, 12:51 PM 08/01/2011 Fredrich Birks PTA [phone] pager [phone] office

## 2011-08-01 NOTE — Progress Notes (Signed)
Subjective: 4 Days Post-Op Procedure(s) (LRB): COMPUTER ASSISTED TOTAL KNEE ARTHROPLASTY (Right)  Patient reports pain as mild.Voiding without difficulty, Moved bowel last evening. Tolerating po pain meds     Objective:   VITALS:  Temp:  [97.8 F (36.6 C)-98.6 F (37 C)] 97.8 F (36.6 C) (01/26 0710) Pulse Rate:  [87-100] 87  (01/26 0710) Resp:  [18-20] 18  (01/26 0710) BP: (116-136)/(45-74) 126/62 mmHg (01/26 0710) SpO2:  [92 %] 92 % (01/26 0710)  Neurologically intact ABD soft Neurovascular intact Intact pulses distally Dorsiflexion/Plantar flexion intact Incision: dressing C/D/I and no drainage   LABS  Basename 07/31/11 0555 07/30/11 0510  HGB 11.0* 10.0*  WBC 9.4 10.1  PLT 159 138*    Basename 07/30/11 0922  NA 139  K 4.6  CL 104  CO2 29  BUN 7  CREATININE 0.47*  GLUCOSE 157*    Basename 08/01/11 0615 07/31/11 0555  LABPT -- --  INR 2.00* 2.02*     Assessment/Plan: 4 Days Post-Op Procedure(s) (LRB): COMPUTER ASSISTED TOTAL KNEE ARTHROPLASTY (Right)  Advance diet D/C IV fluids Discharge home with home health  Leah Shah E 08/01/2011, 12:45 PM

## 2011-08-01 NOTE — Discharge Summary (Signed)
Patient ID: Leah Shah MRN: [id number] DOB/AGE: 1955-10-25 56 y.o.  Admit date: 07/28/2011 Discharge date: 08/01/2011  Admission Diagnoses:  Active Problems:  * No active hospital problems. *  Severe Osteoarthritis right knee.  Discharge Diagnoses:  Same  Past Medical History  Diagnosis Date  . Heart valve problem     was on Redux and it caused some "heart valve damage"-does not see cardiologist  . Shortness of breath     with exertion  . Headache   . Anxiety   . Recurrent upper respiratory infection (URI)     treated /w OTC med.   . Arthritis     R knee, L knee - OA  . Hypertension     echoGavin Shah, wnl 2009? , had taken Redux for diet management  but now  off the market. pt, told that echo wnl.      Surgeries: Procedure(s): COMPUTER ASSISTED TOTAL KNEE ARTHROPLASTY on 07/28/2011   Consultants:    Discharged Condition: Improved  Hospital Course: [name] [name] is an 56 y.o. female who was admitted 07/28/2011 for operative treatment of<principal problem not specified>. Patient has severe unremitting pain that affects sleep, daily activities, and work/hobbies. After pre-op clearance the patient was taken to the operating room on 07/28/2011 and underwent  Procedure(s): COMPUTER ASSISTED TOTAL KNEE ARTHROPLASTY.    Patient was given perioperative antibiotics: Anti-infectives     Start     Dose/Rate Route Frequency Ordered Stop   07/28/11 1630   ceFAZolin (ANCEF) IVPB 1 g/50 mL premix        1 g 100 mL/hr over 30 Minutes Intravenous 3 times per day 07/28/11 1528 07/29/11 0705   07/28/11 0545   ceFAZolin (ANCEF) IVPB 2 g/50 mL premix  Status:  Discontinued        2 g 100 mL/hr over 30 Minutes Intravenous 60 min pre-op 07/28/11 0537 07/28/11 1516   07/27/11 1500   ceFAZolin (ANCEF) IVPB 2 g/50 mL premix        2 g 100 mL/hr over 30 Minutes Intravenous 60 min pre-op 07/27/11 1456 07/28/11 0740   07/27/11 1500   ceFAZolin (ANCEF) IVPB 2 g/50 mL premix  Status:   Discontinued        2 g 100 mL/hr over 30 Minutes Intravenous 60 min pre-op 07/27/11 1456 07/28/11 1516           Patient was given sequential compression devices, early ambulation, and chemoprophylaxis to prevent DVT.Drain removed POD# 1. Foley Discontinued POD#![name] Physical therapy and Occupational therapy initiated POD# 1. CPM started and progressed to 0-40 degrees.  Did not wish to go tho SNF so [facility] and Home PT and OT with warfarin maintenance was arranged. Discharged home 21-Aug-2011 tolerating po narcotics and po diet.  Patient benefited maximally from hospital stay and there were no complications.    Recent vital signs: Patient Vitals for the past 24 hrs:  BP Temp Pulse Resp SpO2  08/01/11 0710 126/62 mmHg 97.8 F (36.6 C) 87  18  92 %  2011-08-21 2145 136/45 mmHg 98.6 F (37 C) 94  18  92 %  08-21-11 1446 116/74 mmHg - - - -  21-Aug-2011 1400 121/70 mmHg 98.2 F (36.8 C) 100  20  92 %     Recent laboratory studies:  Basename 08/01/11 0615 08/21/2011 0555 07/30/11 0922 07/30/11 0510  WBC -- 9.4 -- 10.1  HGB -- 11.0* -- 10.0*  HCT -- 33.9* -- 31.3*  PLT -- 159 -- 138*  NA -- -- 139 --  K -- -- 4.6 --  CL -- -- 104 --  CO2 -- -- 29 --  BUN -- -- 7 --  CREATININE -- -- 0.47* --  GLUCOSE -- -- 157* --  INR 2.00* 2.02* -- --  CALCIUM -- -- 7.8* --     Discharge Medications:   Medication List  As of 08/01/2011  1:02 PM   STOP taking these medications         oxyCODONE-acetaminophen 7.5-325 MG per tablet         TAKE these medications         alprazolam 2 MG tablet   Commonly known as: XANAX   Take 1 mg by mouth at bedtime as needed.      atenolol 100 MG tablet   Commonly known as: TENORMIN   Take 100 mg by mouth daily at 2 PM daily at 2 PM.      cyclobenzaprine 10 MG tablet   Commonly known as: FLEXERIL   Take 10 mg by mouth at bedtime. scheduled      hydrOXYzine 25 MG tablet   Commonly known as: ATARAX/VISTARIL   Take 50 mg by mouth at bedtime. scheduled       losartan 50 MG tablet   Commonly known as: COZAAR   Take 50 mg by mouth daily at 2 PM daily at 2 PM.      MULTIVITAMIN PO   Take 1 tablet by mouth daily. Alive multivitamin (otc)      oxyCODONE 5 MG immediate release tablet   Commonly known as: Oxy IR/ROXICODONE   Take 1-2 tablets (5-10 mg total) by mouth every 3 (three) hours as needed.      simvastatin 40 MG tablet   Commonly known as: ZOCOR   Take 40 mg by mouth at bedtime.      traZODone 150 MG tablet   Commonly known as: DESYREL   Take 150 mg by mouth at bedtime.      warfarin 4 MG tablet   Commonly known as: COUMADIN   Take 1 tablet (4 mg total) by mouth one time only at 6 PM.            Diagnostic Studies: Dg Chest 2 View  07/20/2011  *RADIOLOGY REPORT*  Clinical Data: Preoperative assessment for right total knee arthroplasty, history hypertension, smoking  CHEST - 2 VIEW  Comparison: 06/03/2010  Findings: Enlargement of cardiac silhouette. Mediastinal contours and pulmonary vascularity normal. Linear scarring. Chronic accentuation of interstitial markings stable. No acute infiltrate, pleural effusion or pneumothorax. Scattered end plate spur formation thoracic spine.  IMPRESSION: Enlargement of cardiac silhouette. No acute abnormalities.  Original Report Authenticated By: Lollie Marrow, M.D.   X-ray Knee Right Port  07/28/2011  *RADIOLOGY REPORT*  Clinical Data: Status post arthroplasty  PORTABLE RIGHT KNEE - 1-2 VIEW  Comparison: 2/26/seven  Findings: Postoperative changes from a left total knee arthroplasty identified.  The hardware components are in anatomic alignment.  No complicating features identified.  Surgical drainage catheter overlies the ventral aspect of the knee.  Skin staples are noted along the midline.  IMPRESSION:  1.  No complications after left total knee arthroplasty.  Original Report Authenticated By: Rosealee Albee, M.D.    Disposition:   Discharge Orders    Future Orders Please Complete By  Expires   Diet - low sodium heart healthy      Diet - low sodium heart healthy      Call  MD / Call 911      Comments:   If you experience chest pain or shortness of breath, CALL 911 and be transported to the hospital emergency room.  If you develope a fever above 101 F, pus (white drainage) or increased drainage or redness at the wound, or calf pain, call your surgeon's office.   Constipation Prevention      Comments:   Drink plenty of fluids.  Prune juice may be helpful.  You may use a stool softener, such as Colace (over the counter) 100 mg twice a day.  Use MiraLax (over the counter) for constipation as needed.   Increase activity slowly as tolerated      Weight Bearing as taught in Physical Therapy      Comments:   Use a walker or crutches as instructed.   Discharge instructions      Comments:   1. Weight bearing as tolerated with crutches 2. CPM 2 hours per 8 daily 3.Keep incision dry   Call MD / Call 911      Comments:   If you experience chest pain or shortness of breath, CALL 911 and be transported to the hospital emergency room.  If you develope a fever above 101 F, pus (white drainage) or increased drainage or redness at the wound, or calf pain, call your surgeon's office.   Constipation Prevention      Comments:   Drink plenty of fluids.  Prune juice may be helpful.  You may use a stool softener, such as Colace (over the counter) 100 mg twice a day.  Use MiraLax (over the counter) for constipation as needed.   Increase activity slowly as tolerated      Weight Bearing as taught in Physical Therapy      Comments:   Use a walker or crutches as instructed.   Discharge instructions      Comments:   May bathe after 5 days following surgery. Use knee immobilizer when up and walking. Walk in house 3-4 x a day to exercise.   Driving restrictions      Comments:   No driving for 4-6 weeks   CPM      Comments:   Continuous passive motion machine (CPM):      Use the CPM from 0 to  40 for 4-6 hours per day.      You may increase by10 per day.  You may break it up into 2 or 3 sessions per day.      Use CPM for 4-6 weeks or until you are told to stop.   Change dressing      Comments:   Change dressing on 08/01/2011 then change the dressing daily with sterile 4 x 4 inch gauze dressing and apply TED hose.  You may clean the incision with alcohol prior to redressing.   Do not put a pillow under the knee. Place it under the heel.         Follow-up Information    Follow up with Cammy Copa, MD in 2 weeks.   Contact information:   [facility] [address] [place] [address] [phone]           Signed: Kerrin Champagne 08/01/2011, 1:02 PM

## 2011-08-01 NOTE — Progress Notes (Signed)
Admit Complaint: right knee pain/osteoarthritis failed conserv. Tx.  <Coumadin per Rx>  Anticoag- pod#4 s/p R TKA,Coumadin for VTE px; 55yo obese female, warf points=5, goal INR 2-3;INR stable at 2 (it seems like 2.5mg  dose was not given on 01/24 by RN); no Hgb check today; SNF Monday  Plan 1) repeat oumadin 4mg  po x1; 2) INR in am

## 2011-08-01 NOTE — Progress Notes (Signed)
Patient's family verbalized concern about the patient safely going home today.  Patient's family had discussed the need for SNF with PT and MD.  Family thought that the patient would d/c to SNF on Monday.  MD d/c'd patient when rounding after discussion with the patient.  Reviewed d/c instructions with patient and family.  Gave contact numbers for concerns.  Advised family to call with questions/concerns.  Rx x 2 given.  Pt verbalized understanding.  Pt d/c via wheelchair in stable condition.

## 2011-08-26 ENCOUNTER — Encounter (HOSPITAL_COMMUNITY): Payer: Self-pay

## 2011-08-26 ENCOUNTER — Encounter (HOSPITAL_COMMUNITY): Payer: Self-pay | Admitting: Pharmacy Technician

## 2011-08-27 ENCOUNTER — Ambulatory Visit (HOSPITAL_COMMUNITY): Payer: Medicare HMO | Admitting: Anesthesiology

## 2011-08-27 ENCOUNTER — Encounter (HOSPITAL_COMMUNITY)
Admission: RE | Disposition: A | Discharge status: Home / Self Care | Payer: Self-pay | Source: Ambulatory Visit | Attending: Orthopedic Surgery

## 2011-08-27 ENCOUNTER — Inpatient Hospital Stay (HOSPITAL_COMMUNITY)
Admission: RE | Admit: 2011-08-27 | Discharge: 2011-08-28 | DRG: 863 | Disposition: A | Discharge status: Home / Self Care | Payer: Medicare HMO | Source: Ambulatory Visit | Attending: Orthopedic Surgery | Admitting: Orthopedic Surgery

## 2011-08-27 ENCOUNTER — Encounter (HOSPITAL_COMMUNITY): Payer: Self-pay | Admitting: *Deleted

## 2011-08-27 ENCOUNTER — Encounter (HOSPITAL_COMMUNITY): Payer: Self-pay | Admitting: Anesthesiology

## 2011-08-27 ENCOUNTER — Other Ambulatory Visit (HOSPITAL_COMMUNITY): Payer: Self-pay | Admitting: Orthopedic Surgery

## 2011-08-27 DIAGNOSIS — F17200 Nicotine dependence, unspecified, uncomplicated: Secondary | ICD-10-CM | POA: Diagnosis present

## 2011-08-27 DIAGNOSIS — Z886 Allergy status to analgesic agent status: Secondary | ICD-10-CM

## 2011-08-27 DIAGNOSIS — Z90711 Acquired absence of uterus with remaining cervical stump: Secondary | ICD-10-CM

## 2011-08-27 DIAGNOSIS — T8140XA Infection following a procedure, unspecified, initial encounter: Principal | ICD-10-CM | POA: Diagnosis present

## 2011-08-27 DIAGNOSIS — I10 Essential (primary) hypertension: Secondary | ICD-10-CM | POA: Diagnosis present

## 2011-08-27 DIAGNOSIS — L089 Local infection of the skin and subcutaneous tissue, unspecified: Secondary | ICD-10-CM

## 2011-08-27 DIAGNOSIS — Z96659 Presence of unspecified artificial knee joint: Secondary | ICD-10-CM

## 2011-08-27 HISTORY — PX: I & D EXTREMITY: SHX5045

## 2011-08-27 HISTORY — DX: Cardiac arrhythmia, unspecified: I49.9

## 2011-08-27 LAB — PROTIME-INR
INR: 1.48 (ref 0.00–1.49)
Prothrombin Time: 18.2 seconds — ABNORMAL HIGH (ref 11.6–15.2)

## 2011-08-27 LAB — SURGICAL PCR SCREEN: MRSA, PCR: NEGATIVE

## 2011-08-27 LAB — BASIC METABOLIC PANEL
BUN: 6 mg/dL (ref 6–23)
Calcium: 9.3 mg/dL (ref 8.4–10.5)
GFR calc non Af Amer: >90 mL/min (ref >90)
Glucose, Bld: 98 mg/dL (ref 70–99)

## 2011-08-27 LAB — CBC
MCH: 34.9 pg — ABNORMAL HIGH (ref 26.0–34.0)
MCHC: 32.7 g/dL (ref 30.0–36.0)
Platelets: 205 10*3/uL (ref 150–400)
RDW: 14.4 % (ref 11.5–15.5)

## 2011-08-27 LAB — DIFFERENTIAL
Basophils Relative: 0 % (ref 0–1)
Eosinophils Absolute: 0.3 10*3/uL (ref 0.0–0.7)
Neutrophils Relative %: 61 % (ref 43–77)

## 2011-08-27 LAB — SYNOVIAL CELL COUNT + DIFF, W/ CRYSTALS
Neutrophil, Synovial: 88 % — ABNORMAL HIGH (ref 0–25)
WBC, Synovial: 1275 /mm3 — ABNORMAL HIGH (ref 0–200)

## 2011-08-27 SURGERY — IRRIGATION AND DEBRIDEMENT EXTREMITY
Anesthesia: General | Site: Knee | Laterality: Right | Wound class: Dirty or Infected

## 2011-08-27 MED ORDER — GLYCOPYRROLATE 0.2 MG/ML IJ SOLN
INTRAMUSCULAR | Status: DC | PRN
  Administered 2011-08-27: .9 mg via INTRAVENOUS

## 2011-08-27 MED ORDER — FENTANYL CITRATE 0.05 MG/ML IJ SOLN
INTRAMUSCULAR | Status: DC | PRN
  Administered 2011-08-27: 50 ug via INTRAVENOUS
  Administered 2011-08-27: 150 ug via INTRAVENOUS
  Administered 2011-08-27: 50 ug via INTRAVENOUS

## 2011-08-27 MED ORDER — VANCOMYCIN HCL IN DEXTROSE 1-5 GM/200ML-% IV SOLN
1000.0000 mg | Freq: Two times a day (BID) | INTRAVENOUS | Status: AC
  Administered 2011-08-28: 1000 mg via INTRAVENOUS
  Filled 2011-08-27: qty 200

## 2011-08-27 MED ORDER — HYDROMORPHONE HCL PF 1 MG/ML IJ SOLN
0.2500 mg | INTRAMUSCULAR | Status: DC | PRN
  Administered 2011-08-27: 0.5 mg via INTRAVENOUS

## 2011-08-27 MED ORDER — ROCURONIUM BROMIDE 100 MG/10ML IV SOLN
INTRAVENOUS | Status: DC | PRN
  Administered 2011-08-27: 50 mg via INTRAVENOUS

## 2011-08-27 MED ORDER — ONDANSETRON HCL 4 MG/2ML IJ SOLN: 4.0000 mg | Freq: Four times a day (QID) | INTRAMUSCULAR | Status: DC | PRN

## 2011-08-27 MED ORDER — SIMVASTATIN 40 MG PO TABS
40.0000 mg | ORAL_TABLET | Freq: Every day | ORAL | Status: DC
  Administered 2011-08-27: 40 mg via ORAL
  Filled 2011-08-27 (×2): qty 1

## 2011-08-27 MED ORDER — METOCLOPRAMIDE HCL 10 MG PO TABS: 5.0000 mg | ORAL_TABLET | Freq: Three times a day (TID) | ORAL | Status: DC | PRN

## 2011-08-27 MED ORDER — LOSARTAN POTASSIUM 50 MG PO TABS
50.0000 mg | ORAL_TABLET | Freq: Every day | ORAL | Status: DC
  Administered 2011-08-27: 50 mg via ORAL
  Filled 2011-08-27 (×2): qty 1

## 2011-08-27 MED ORDER — HYDROXYZINE HCL 50 MG PO TABS
50.0000 mg | ORAL_TABLET | Freq: Every day | ORAL | Status: DC
  Administered 2011-08-27: 50 mg via ORAL
  Filled 2011-08-27 (×2): qty 1

## 2011-08-27 MED ORDER — PROPOFOL 10 MG/ML IV EMUL
INTRAVENOUS | Status: DC | PRN
  Administered 2011-08-27: 160 mg via INTRAVENOUS

## 2011-08-27 MED ORDER — SODIUM CHLORIDE 0.9 % IR SOLN
Status: DC | PRN
  Administered 2011-08-27: 3000 mL

## 2011-08-27 MED ORDER — MULTIVITAMIN PO LIQD: Freq: Every morning | ORAL | Status: DC

## 2011-08-27 MED ORDER — PHENOL 1.4 % MT LIQD
1.0000 | OROMUCOSAL | Status: DC | PRN
  Filled 2011-08-27: qty 177

## 2011-08-27 MED ORDER — METHOCARBAMOL 500 MG PO TABS
500.0000 mg | ORAL_TABLET | Freq: Three times a day (TID) | ORAL | Status: DC | PRN
  Administered 2011-08-27: 500 mg via ORAL
  Filled 2011-08-27: qty 1

## 2011-08-27 MED ORDER — MENTHOL 3 MG MT LOZG: 1.0000 | LOZENGE | OROMUCOSAL | Status: DC | PRN

## 2011-08-27 MED ORDER — ONDANSETRON HCL 4 MG/2ML IJ SOLN: 4.0000 mg | Freq: Once | INTRAMUSCULAR | Status: DC | PRN

## 2011-08-27 MED ORDER — COUMADIN BOOK
Freq: Once | Status: AC
  Administered 2011-08-27: 22:00:00
  Filled 2011-08-27: qty 1

## 2011-08-27 MED ORDER — ONDANSETRON HCL 4 MG PO TABS: 4.0000 mg | ORAL_TABLET | Freq: Four times a day (QID) | ORAL | Status: DC | PRN

## 2011-08-27 MED ORDER — ACETAMINOPHEN 650 MG RE SUPP: 650.0000 mg | Freq: Four times a day (QID) | RECTAL | Status: DC | PRN

## 2011-08-27 MED ORDER — MUPIROCIN 2 % EX OINT
TOPICAL_OINTMENT | CUTANEOUS | Status: AC
  Filled 2011-08-27: qty 22

## 2011-08-27 MED ORDER — ACETAMINOPHEN 325 MG PO TABS: 650.0000 mg | ORAL_TABLET | Freq: Four times a day (QID) | ORAL | Status: DC | PRN

## 2011-08-27 MED ORDER — LACTATED RINGERS IV SOLN
INTRAVENOUS | Status: DC | PRN
  Administered 2011-08-27: 19:00:00 via INTRAVENOUS

## 2011-08-27 MED ORDER — ONDANSETRON HCL 4 MG/2ML IJ SOLN
INTRAMUSCULAR | Status: DC | PRN
  Administered 2011-08-27: 4 mg via INTRAVENOUS

## 2011-08-27 MED ORDER — ALPRAZOLAM 0.5 MG PO TABS: 1.0000 mg | ORAL_TABLET | Freq: Every evening | ORAL | Status: DC | PRN

## 2011-08-27 MED ORDER — MUPIROCIN 2 % EX OINT
TOPICAL_OINTMENT | Freq: Two times a day (BID) | CUTANEOUS | Status: DC
  Filled 2011-08-27: qty 22

## 2011-08-27 MED ORDER — LIDOCAINE HCL (CARDIAC) 20 MG/ML IV SOLN
INTRAVENOUS | Status: DC | PRN
  Administered 2011-08-27: 40 mg via INTRAVENOUS

## 2011-08-27 MED ORDER — WARFARIN SODIUM 5 MG PO TABS
5.0000 mg | ORAL_TABLET | ORAL | Status: AC
  Administered 2011-08-27: 5 mg via ORAL
  Filled 2011-08-27: qty 1

## 2011-08-27 MED ORDER — POTASSIUM CHLORIDE IN NACL 20-0.9 MEQ/L-% IV SOLN
INTRAVENOUS | Status: DC
  Administered 2011-08-27: 75 mL/h via INTRAVENOUS
  Filled 2011-08-27 (×2): qty 1000

## 2011-08-27 MED ORDER — HYDROMORPHONE HCL PF 1 MG/ML IJ SOLN: 0.5000 mg | INTRAMUSCULAR | Status: DC | PRN

## 2011-08-27 MED ORDER — ATENOLOL 100 MG PO TABS
100.0000 mg | ORAL_TABLET | Freq: Every day | ORAL | Status: DC
  Filled 2011-08-27: qty 1

## 2011-08-27 MED ORDER — TRAZODONE HCL 150 MG PO TABS
150.0000 mg | ORAL_TABLET | Freq: Every day | ORAL | Status: DC
  Administered 2011-08-28: 150 mg via ORAL
  Filled 2011-08-27 (×3): qty 1

## 2011-08-27 MED ORDER — NEOSTIGMINE METHYLSULFATE 1 MG/ML IJ SOLN
INTRAMUSCULAR | Status: DC | PRN
  Administered 2011-08-27: 5 mg via INTRAVENOUS

## 2011-08-27 MED ORDER — DEXTROSE 5 % IV SOLN
1000.0000 mg | INTRAVENOUS | Status: DC | PRN
  Administered 2011-08-27: 1000 mg via INTRAVENOUS

## 2011-08-27 MED ORDER — METOCLOPRAMIDE HCL 5 MG/ML IJ SOLN
5.0000 mg | Freq: Three times a day (TID) | INTRAMUSCULAR | Status: DC | PRN
  Filled 2011-08-27: qty 2

## 2011-08-27 MED ORDER — OXYCODONE HCL 5 MG PO TABS
5.0000 mg | ORAL_TABLET | ORAL | Status: DC | PRN
  Administered 2011-08-28 (×2): 10 mg via ORAL
  Filled 2011-08-27 (×3): qty 2

## 2011-08-27 MED ORDER — MIDAZOLAM HCL 5 MG/5ML IJ SOLN
INTRAMUSCULAR | Status: DC | PRN
  Administered 2011-08-27: 2 mg via INTRAVENOUS

## 2011-08-27 MED ORDER — ADULT MULTIVITAMIN W/MINERALS CH
1.0000 | ORAL_TABLET | Freq: Every day | ORAL | Status: DC
  Filled 2011-08-27: qty 1

## 2011-08-27 MED ORDER — WARFARIN VIDEO: Freq: Once | Status: DC

## 2011-08-27 MED ORDER — OXYCODONE HCL 5 MG PO TABA: 1.0000 | ORAL_TABLET | ORAL | Status: DC | PRN

## 2011-08-27 MED ORDER — VANCOMYCIN HCL IN DEXTROSE 1-5 GM/200ML-% IV SOLN
INTRAVENOUS | Status: AC
  Filled 2011-08-27: qty 200

## 2011-08-27 SURGICAL SUPPLY — 77 items
BANDAGE ELASTIC 4 VELCRO ST LF (GAUZE/BANDAGES/DRESSINGS) ×1 IMPLANT
BANDAGE ELASTIC 6 VELCRO ST LF (GAUZE/BANDAGES/DRESSINGS) ×2 IMPLANT
BANDAGE ESMARK 6X9 LF (GAUZE/BANDAGES/DRESSINGS) ×2 IMPLANT
BLADE SAW SGTL 13.0X1.19X90.0M (BLADE) IMPLANT
BNDG CMPR 9X6 STRL LF SNTH (GAUZE/BANDAGES/DRESSINGS) ×2
BNDG CMPR MED 10X6 ELC LF (GAUZE/BANDAGES/DRESSINGS)
BNDG COHESIVE 6X5 TAN STRL LF (GAUZE/BANDAGES/DRESSINGS) ×3 IMPLANT
BNDG ELASTIC 6X10 VLCR STRL LF (GAUZE/BANDAGES/DRESSINGS) ×3 IMPLANT
BNDG ESMARK 6X9 LF (GAUZE/BANDAGES/DRESSINGS) ×3
BOWL SMART MIX CTS (DISPOSABLE) ×1 IMPLANT
CLOTH BEACON ORANGE TIMEOUT ST (SAFETY) ×3 IMPLANT
COVER BACK TABLE 24X17X13 BIG (DRAPES) IMPLANT
COVER SURGICAL LIGHT HANDLE (MISCELLANEOUS) ×3 IMPLANT
CUFF TOURNIQUET SINGLE 34IN LL (TOURNIQUET CUFF) IMPLANT
CUFF TOURNIQUET SINGLE 44IN (TOURNIQUET CUFF) IMPLANT
DRAPE INCISE IOBAN 66X45 STRL (DRAPES) IMPLANT
DRAPE ORTHO SPLIT 77X108 STRL (DRAPES) ×9
DRAPE PROXIMA HALF (DRAPES) ×1 IMPLANT
DRAPE SURG ORHT 6 SPLT 77X108 (DRAPES) ×6 IMPLANT
DRAPE U-SHAPE 47X51 STRL (DRAPES) ×3 IMPLANT
DRAPE X-RAY CASS 24X20 (DRAPES) IMPLANT
DRSG PAD ABDOMINAL 8X10 ST (GAUZE/BANDAGES/DRESSINGS) ×4 IMPLANT
DURAPREP 26ML APPLICATOR (WOUND CARE) ×3 IMPLANT
ELECT REM PT RETURN 9FT ADLT (ELECTROSURGICAL) ×3
ELECTRODE REM PT RTRN 9FT ADLT (ELECTROSURGICAL) ×2 IMPLANT
EVACUATOR 1/8 PVC DRAIN (DRAIN) ×1 IMPLANT
FACESHIELD LNG OPTICON STERILE (SAFETY) ×3 IMPLANT
FLUID NSS /IRRIG 3000 ML XXX (IV SOLUTION) ×3 IMPLANT
GAUZE XEROFORM 1X8 LF (GAUZE/BANDAGES/DRESSINGS) ×2 IMPLANT
GAUZE XEROFORM 5X9 LF (GAUZE/BANDAGES/DRESSINGS) ×1 IMPLANT
GLOVE BIO SURGEON ST LM GN SZ9 (GLOVE) ×1 IMPLANT
GLOVE BIOGEL PI IND STRL 8 (GLOVE) ×2 IMPLANT
GLOVE BIOGEL PI INDICATOR 8 (GLOVE) ×1
GLOVE SURG ORTHO 8.0 STRL STRW (GLOVE) ×3 IMPLANT
GOWN PREVENTION PLUS LG XLONG (DISPOSABLE) IMPLANT
GOWN PREVENTION PLUS XLARGE (GOWN DISPOSABLE) ×5 IMPLANT
GOWN STRL NON-REIN LRG LVL3 (GOWN DISPOSABLE) ×5 IMPLANT
HANDPIECE INTERPULSE COAX TIP (DISPOSABLE) ×3
HOOD PEEL AWAY FACE SHEILD DIS (HOOD) ×7 IMPLANT
IMMOBILIZER KNEE 20 (SOFTGOODS)
IMMOBILIZER KNEE 20 THIGH 36 (SOFTGOODS) IMPLANT
IMMOBILIZER KNEE 22 UNIV (SOFTGOODS) IMPLANT
IMMOBILIZER KNEE 24 THIGH 36 (MISCELLANEOUS) IMPLANT
IMMOBILIZER KNEE 24 UNIV (MISCELLANEOUS)
KIT BASIN OR (CUSTOM PROCEDURE TRAY) ×3 IMPLANT
KIT ROOM TURNOVER OR (KITS) ×3 IMPLANT
MANIFOLD NEPTUNE II (INSTRUMENTS) ×3 IMPLANT
NDL 18GX1X1/2 (RX/OR ONLY) (NEEDLE) IMPLANT
NDL SPNL 18GX3.5 QUINCKE PK (NEEDLE) IMPLANT
NEEDLE 18GX1X1/2 (RX/OR ONLY) (NEEDLE) IMPLANT
NEEDLE SPNL 18GX3.5 QUINCKE PK (NEEDLE) ×3 IMPLANT
NS IRRIG 1000ML POUR BTL (IV SOLUTION) ×3 IMPLANT
PACK TOTAL JOINT (CUSTOM PROCEDURE TRAY) ×3 IMPLANT
PAD ARMBOARD 7.5X6 YLW CONV (MISCELLANEOUS) ×6 IMPLANT
PAD CAST 4YDX4 CTTN HI CHSV (CAST SUPPLIES) ×1 IMPLANT
PADDING CAST COTTON 4X4 STRL (CAST SUPPLIES)
PADDING CAST COTTON 6X4 STRL (CAST SUPPLIES) ×5 IMPLANT
RUBBERBAND STERILE (MISCELLANEOUS) IMPLANT
SET HNDPC FAN SPRY TIP SCT (DISPOSABLE) ×2 IMPLANT
SPONGE GAUZE 4X4 12PLY (GAUZE/BANDAGES/DRESSINGS) ×1 IMPLANT
SPONGE LAP 18X18 X RAY DECT (DISPOSABLE) IMPLANT
STAPLER VISISTAT 35W (STAPLE) ×3 IMPLANT
SUCTION FRAZIER TIP 10 FR DISP (SUCTIONS) ×3 IMPLANT
SUT ETHILON 3 0 PS 1 (SUTURE) ×6 IMPLANT
SUT VIC AB 0 CTB1 27 (SUTURE) ×7 IMPLANT
SUT VIC AB 1 CT1 27 (SUTURE) ×6
SUT VIC AB 1 CT1 27XBRD ANBCTR (SUTURE) ×7 IMPLANT
SUT VIC AB 2-0 CT1 27 (SUTURE) ×6
SUT VIC AB 2-0 CT1 TAPERPNT 27 (SUTURE) ×4 IMPLANT
SWAB COLLECTION DEVICE MRSA (MISCELLANEOUS) ×2 IMPLANT
SYR 30ML SLIP (SYRINGE) ×2 IMPLANT
SYR TB 1ML LUER SLIP (SYRINGE) IMPLANT
TOWEL OR 17X24 6PK STRL BLUE (TOWEL DISPOSABLE) ×1 IMPLANT
TOWEL OR 17X26 10 PK STRL BLUE (TOWEL DISPOSABLE) ×12 IMPLANT
TRAY FOLEY CATH 14FR (SET/KITS/TRAYS/PACK) ×1 IMPLANT
TUBE ANAEROBIC SPECIMEN COL (MISCELLANEOUS) ×2 IMPLANT
WATER STERILE IRR 1000ML POUR (IV SOLUTION) ×9 IMPLANT

## 2011-08-27 NOTE — Anesthesia Postprocedure Evaluation (Signed)
  Anesthesia Post-op Note  Patient: Leah Shah  Procedure(s) Performed: Procedure(s) (LRB): IRRIGATION AND DEBRIDEMENT EXTREMITY (Right)  Patient Location: PACU  Anesthesia Type: General  Level of Consciousness: awake, alert  and oriented  Airway and Oxygen Therapy: Patient Spontanous Breathing and Patient connected to nasal cannula oxygen  Post-op Pain: mild  Post-op Assessment: Post-op Vital signs reviewed and Patient's Cardiovascular Status Stable  Post-op Vital Signs: stable  Complications: No apparent anesthesia complications

## 2011-08-27 NOTE — Anesthesia Procedure Notes (Signed)
Procedure Name: Intubation Date/Time: 08/27/2011 7:16 PM Performed by: Shirlyn Goltz, TOM Pre-anesthesia Checklist: Patient identified, Emergency Drugs available, Suction available, Patient being monitored and Timeout performed Patient Re-evaluated:Patient Re-evaluated prior to inductionOxygen Delivery Method: Circle system utilized Preoxygenation: Pre-oxygenation with 100% oxygen Intubation Type: IV induction Ventilation: Mask ventilation without difficulty and Oral airway inserted - appropriate to patient size Laryngoscope Size: Mac and 4 Grade View: Grade II Tube type: Oral Tube size: 7.5 mm Number of attempts: 1 Airway Equipment and Method: Stylet Placement Confirmation: ETT inserted through vocal cords under direct vision,  positive ETCO2 and breath sounds checked- equal and bilateral Secured at: 21 cm Tube secured with: Tape Dental Injury: Teeth and Oropharynx as per pre-operative assessment

## 2011-08-27 NOTE — Transfer of Care (Signed)
Immediate Anesthesia Transfer of Care Note  Patient: Leah Shah  Procedure(s) Performed: Procedure(s) (LRB): IRRIGATION AND DEBRIDEMENT EXTREMITY (Right)  Patient Location: PACU  Anesthesia Type: General  Level of Consciousness: awake, alert , oriented and patient cooperative  Airway & Oxygen Therapy: Patient Spontanous Breathing and Patient connected to nasal cannula oxygen  Post-op Assessment: Report given to PACU RN, Post -op Vital signs reviewed and stable and Patient moving all extremities X 4  Post vital signs: Reviewed and stable  Complications: No apparent anesthesia complications

## 2011-08-27 NOTE — Preoperative (Signed)
Beta Blockers   Reason not to administer Beta Blockers:Not Applicable 

## 2011-08-27 NOTE — Brief Op Note (Signed)
08/27/2011  8:02 PM  PATIENT:  Leah Shah  56 y.o. female  PRE-OPERATIVE DIAGNOSIS:  Right total knee arthroplasty superficial infection  POST-OPERATIVE DIAGNOSIS:  right total knee armthroplasty superficial infection  PROCEDURE:  Procedure(s): IRRIGATION AND DEBRIDEMENT EXTREMITY  SURGEON:  Surgeon(s): Cammy Copa, MD  ASSISTANT: none  ANESTHESIA:   general  EBL: 19 ml       BLOOD ADMINISTERED: none  DRAINS: none   LOCAL MEDICATIONS USED:  none  SPECIMEN: knee aspirate and cxs x 2 COUNTS:  YES  TOURNIQUET:   Total Tourniquet Time Documented: Thigh (Left) - 6 minutes  DICTATION: .Other Dictation: Dictation [id number]  PLAN OF CARE: Admit for overnight observation  PATIENT DISPOSITION:  PACU - hemodynamically stable

## 2011-08-27 NOTE — H&P (Signed)
Leah Shah is an 56 y.o. female.   Chief Complaint: right knee pain HPI: Leah Shah is a 56 year old patient who is one month out from right total knee replacement. The patient was doing well until her office visit yesterday when she came in and had mild drainage from the inferior aspect of her total knee replacement incision. She denies any fever or chills. She states the knee in general is functioning well and she is improving her range of motion. She has not taken any antibiotics since her hospitalization.  Past Medical History  Diagnosis Date  . Heart valve problem     was on Redux and it caused some "heart valve damage"-does not see cardiologist  . Headache   . Anxiety   . Recurrent upper respiratory infection (URI)     treated /w OTC med.   . Arthritis     R knee, L knee - OA  . Hypertension     echoGavin Potters, wnl 2009? , had taken Redux for diet management  but now  off the market. pt, told that echo wnl.    . Dysrhythmia     "mild arrythmia after taking Redux diet med years ago"no cardio fi  . Shortness of breath     with exertion    Past Surgical History  Procedure Date  . Abdominal hysterectomy     partial-uterus removed  . Tmj arthroplasty     [facility]  . Heel spur surgery   . Knee arthroscopy     right knee  . Coccyx removal     [facility]  . Eye surgery     states both lens replaced. Not sure if cataract surgery or not.  . Cholecystectomy     2011  . Knee arthroplasty 07/28/2011    Procedure: COMPUTER ASSISTED TOTAL KNEE ARTHROPLASTY;  Surgeon: Cammy Copa, MD;  Location: [facility] OR;  Service: Orthopedics;  Laterality: Right;  right total knee arthroplasty, computer assisted    Family History  Problem Relation Age of Onset  . Anesthesia problems Neg Hx   . Hypotension Neg Hx   . Malignant hyperthermia Neg Hx   . Pseudochol deficiency Neg Hx    Social History:  reports that she has been smoking.  She does not have any smokeless tobacco history on  file. She reports that she does not drink alcohol or use illicit drugs.  Allergies:  Allergies  Allergen Reactions  . Codeine     Itching "she can take some forms of it"    Medications Prior to Admission  Medication Dose Route Frequency Provider Last Rate Last Dose  . mupirocin ointment (BACTROBAN) 2 %   Nasal BID Cammy Copa, MD      . mupirocin ointment [facility]) 2 %            Medications Prior to Admission  Medication Sig Dispense Refill  . atenolol (TENORMIN) 100 MG tablet Take 100 mg by mouth daily at 2 PM daily at 2 PM.       . hydrOXYzine (ATARAX/VISTARIL) 25 MG tablet Take 50 mg by mouth at bedtime. scheduled       . losartan (COZAAR) 50 MG tablet Take 50 mg by mouth daily at 2 PM daily at 2 PM.       . Multiple Vitamin (MULTIVITAMIN PO) Take 1 tablet by mouth daily.       . simvastatin (ZOCOR) 40 MG tablet Take 40 mg by mouth at bedtime.        [name]  traZODone (DESYREL) 150 MG tablet Take 150 mg by mouth at bedtime.       [name] alprazolam (XANAX) 2 MG tablet Take 1 mg by mouth at bedtime as needed. For anxiety        Results for orders placed during the hospital encounter of 08/27/11 (from the past 48 hour(s))  SURGICAL PCR SCREEN     Status: Normal   Collection Time   08/27/11  2:25 PM      Component Value Range Comment   MRSA, PCR NEGATIVE  NEGATIVE     Staphylococcus aureus NEGATIVE  NEGATIVE    BASIC METABOLIC PANEL     Status: Normal   Collection Time   08/27/11  2:28 PM      Component Value Range Comment   Sodium 140  135 - 145 (mEq/L)    Potassium 4.6  3.5 - 5.1 (mEq/L)    Chloride 104  96 - 112 (mEq/L)    CO2 29  19 - 32 (mEq/L)    Glucose, Bld 98  70 - 99 (mg/dL)    BUN 6  6 - 23 (mg/dL)    Creatinine, Ser 2.95  0.50 - 1.10 (mg/dL)    Calcium 9.3  8.4 - 10.5 (mg/dL)    GFR calc non Af Amer >90  >90 (mL/min)    GFR calc Af Amer >90  >90 (mL/min)   CBC     Status: Abnormal   Collection Time   08/27/11  2:28 PM      Component Value Range Comment   WBC  9.0  4.0 - 10.5 (K/uL)    RBC 3.52 (*) 3.87 - 5.11 (MIL/uL)    Hemoglobin 12.3  12.0 - 15.0 (g/dL)    HCT 62.1  30.8 - 65.7 (%)    MCV 106.8 (*) 78.0 - 100.0 (fL)    MCH 34.9 (*) 26.0 - 34.0 (pg)    MCHC 32.7  30.0 - 36.0 (g/dL)    RDW 84.6  96.2 - 95.2 (%)    Platelets 205  150 - 400 (K/uL)   DIFFERENTIAL     Status: Normal   Collection Time   08/27/11  2:28 PM      Component Value Range Comment   Neutrophils Relative 61  43 - 77 (%)    Neutro Abs 5.5  1.7 - 7.7 (K/uL)    Lymphocytes Relative 28  12 - 46 (%)    Lymphs Abs 2.6  0.7 - 4.0 (K/uL)    Monocytes Relative 7  3 - 12 (%)    Monocytes Absolute 0.6  0.1 - 1.0 (K/uL)    Eosinophils Relative 3  0 - 5 (%)    Eosinophils Absolute 0.3  0.0 - 0.7 (K/uL)    Basophils Relative 0  0 - 1 (%)    Basophils Absolute 0.0  0.0 - 0.1 (K/uL)   PROTIME-INR     Status: Abnormal   Collection Time   08/27/11  2:28 PM      Component Value Range Comment   Prothrombin Time 18.2 (*) 11.6 - 15.2 (seconds)    INR 1.48  0.00 - 1.49    TYPE AND SCREEN     Status: Normal   Collection Time   08/27/11  2:34 PM      Component Value Range Comment   ABO/RH(D) B POS      Antibody Screen NEG      Sample Expiration 08/30/2011      No results found.  Review of Systems  Constitutional: Negative.   HENT: Negative.   Eyes: Negative.   Respiratory: Negative.   Cardiovascular: Negative.   Gastrointestinal: Negative.   Genitourinary: Negative.   Musculoskeletal: Positive for joint pain.  Skin: Negative.   Neurological: Negative.   Endo/Heme/Allergies: Negative.   Psychiatric/Behavioral: Negative.     Blood pressure 90/51, pulse 61, temperature 97 F (36.1 C), temperature source Oral, resp. rate 20, height 5\' 8"  (1.727 m), weight 139.708 kg (308 lb), SpO2 94.00%. Physical Exam  Constitutional: She is oriented to person, place, and time.  HENT:  Head: Normocephalic.  Eyes: Pupils are equal, round, and reactive to light.  Neck: Normal range of  motion.  Cardiovascular: Normal rate.   Respiratory: Effort normal.  GI: Soft.  Neurological: She is alert and oriented to person, place, and time.  Skin: Skin is warm.   on examination the right knee the patient has erythema and very mild drainage from the inferior aspect of the incision. There is a trace right knee effusion. The knee itself is not particularly warm but there is a problem with the incision distally. This area was explored in the office yesterday and there is no obvious direct communication to the joint or bone.  Assessment/Plan Impression is right knee superficial versus deep infection. Plan is for aspiration of the knee under sterile conditions in the LR along with exploration of this incision distally. If the infection potentially communicates with the joint then a formal arthrotomy me will be made and probably change and debridement will be performed. If not then superficial I&D will be performed along with cultures and IV/oral antibiotics. I do plan to keep the patient overnight. No preoperative antibiotics are to be given. Patient understands the risk and benefits of all the potential snore is associated with this surgery. All questions answered.  Zakirah Weingart SCOTT 08/27/2011, 5:58 PM

## 2011-08-27 NOTE — Consult Note (Signed)
ANTICOAGULATION CONSULT NOTE - Follow Up Consult  Pharmacy Consult for Coumadin Indication: VTE prophylaxis  Allergies  Allergen Reactions  . Codeine     Itching "she can take some forms of it"    Patient Measurements: Height: 5\' 8"  (172.7 cm) Weight: 308 lb (139.708 kg) IBW/kg (Calculated) : 63.9    Vital Signs: Temp: 97.7 F (36.5 C) (02/21 2045) Temp src: Oral (02/21 1445) BP: 125/50 mmHg (02/21 2045) Pulse Rate: 58  (02/21 2045)  Labs:  Basename 08/27/11 1428  HGB 12.3  HCT 37.6  PLT 205  APTT --  LABPROT 18.2*  INR 1.48  HEPARINUNFRC --  CREATININE 0.55  CKTOTAL --  CKMB --  TROPONINI --   Estimated Creatinine Clearance: 115.4 ml/min (by C-G formula based on Cr of 0.55).  Past Medical History  Diagnosis Date  . Heart valve problem     was on Redux and it caused some "heart valve damage"-does not see cardiologist  . Headache   . Anxiety   . Recurrent upper respiratory infection (URI)     treated /w OTC med.   . Arthritis     R knee, L knee - OA  . Hypertension     echoGavin Potters, wnl 2009? , had taken Redux for diet management  but now  off the market. pt, told that echo wnl.    . Dysrhythmia     "mild arrythmia after taking Redux diet med years ago"no cardio fi  . Shortness of breath     with exertion   Past Surgical History  Procedure Date  . Abdominal hysterectomy     partial-uterus removed  . Tmj arthroplasty     [facility]- 1997  . Heel spur surgery   . Knee arthroscopy     right knee  . Coccyx removal     [facility]  . Eye surgery     states both lens replaced. Not sure if cataract surgery or not.  . Cholecystectomy     2011  . Knee arthroplasty 07/28/2011    Procedure: COMPUTER ASSISTED TOTAL KNEE ARTHROPLASTY;  Surgeon: Cammy Copa, MD;  Location: [facility] OR;  Service: Orthopedics;  Laterality: Right;  right total knee arthroplasty, computer assisted    Medications:  Prescriptions prior to admission  Medication Sig Dispense Refill    . atenolol (TENORMIN) 100 MG tablet Take 100 mg by mouth daily at 2 PM daily at 2 PM.       . hydrOXYzine (ATARAX/VISTARIL) 25 MG tablet Take 50 mg by mouth at bedtime. scheduled       . losartan (COZAAR) 50 MG tablet Take 50 mg by mouth daily at 2 PM daily at 2 PM.       . Multiple Vitamin (MULTIVITAMIN PO) Take 1 tablet by mouth daily.       . OxyCODONE HCl, Abuse Deter, 5 MG TABS Take 1-2 tablets by mouth every 4 (four) hours as needed. For pain      . simvastatin (ZOCOR) 40 MG tablet Take 40 mg by mouth at bedtime.        . traZODone (DESYREL) 150 MG tablet Take 150 mg by mouth at bedtime.       [name] alprazolam (XANAX) 2 MG tablet Take 1 mg by mouth at bedtime as needed. For anxiety      . methocarbamol (ROBAXIN) 500 MG tablet Take 500 mg by mouth every 8 (eight) hours as needed. For muscle spasms       Scheduled:    .  atenolol  100 mg Oral Q1400  . hydrOXYzine  50 mg Oral QHS  . losartan  50 mg Oral Q1400  . Multivitamin   Oral q morning - 10a  . mupirocin ointment      . simvastatin  40 mg Oral QHS  . traZODone  150 mg Oral QHS  . vancomycin  1,000 mg Intravenous Q12H  . DISCONTD: mupirocin ointment   Nasal BID    Assessment:  Patient is a 56 year old patient who is one month out from right total knee replacement.  She is admitted for treatment of a right knee superficial versus deep infection.  S/p irrigation and debridement of knee.  She is on Vancomycin post-op.  Coumadin is to be restarted as per protocol.  (She received Coumadin post-op her R-TKR--home dose unknown).  Goal of Therapy:   INR 2-3   Plan:   Coumadin 5 mg po x 1 tonight. Warf ED initiated with Coumadin booklet and planned viewing of Coumadin video. Daily PT/INR, CBC.  Trapper Meech, Elisha Headland, Pharm.D. 08/27/2011 9:21 PM

## 2011-08-27 NOTE — Anesthesia Preprocedure Evaluation (Addendum)
Anesthesia Evaluation  Patient identified by MRN, date of birth, ID band Patient awake    Reviewed: Allergy & Precautions, H&P , NPO status , Patient's Chart, lab work & pertinent test results  Airway Mallampati: II TM Distance: >3 FB Neck ROM: full    Dental  (+) Dental Advisory Given   Pulmonary shortness of breath, Recent URI , Current Smoker,  + rhonchi        Cardiovascular Exercise Tolerance: Poor hypertension, Pt. on medications and Pt. on home beta blockers + dysrhythmias regular Normal    Neuro/Psych  Headaches,    GI/Hepatic   Endo/Other  Morbid obesity  Renal/GU      Musculoskeletal   Abdominal   Peds  Hematology   Anesthesia Other Findings   Reproductive/Obstetrics                          Anesthesia Physical Anesthesia Plan  ASA: III  Anesthesia Plan: General   Post-op Pain Management:    Induction: Intravenous  Airway Management Planned: Oral ETT and LMA  Additional Equipment:   Intra-op Plan:   Post-operative Plan: Extubation in OR  Informed Consent: I have reviewed the patients History and Physical, chart, labs and discussed the procedure including the risks, benefits and alternatives for the proposed anesthesia with the patient or authorized representative who has indicated his/her understanding and acceptance.   Dental advisory given  Plan Discussed with: Anesthesiologist, CRNA and Surgeon  Anesthesia Plan Comments:        Anesthesia Quick Evaluation

## 2011-08-28 LAB — CBC
HCT: 35.6 % — ABNORMAL LOW (ref 36.0–46.0)
Hemoglobin: 11.2 g/dL — ABNORMAL LOW (ref 12.0–15.0)
MCHC: 31.5 g/dL (ref 30.0–36.0)
RBC: 3.3 MIL/uL — ABNORMAL LOW (ref 3.87–5.11)

## 2011-08-28 LAB — PROTIME-INR
INR: 1.36 (ref 0.00–1.49)
Prothrombin Time: 17 seconds — ABNORMAL HIGH (ref 11.6–15.2)

## 2011-08-28 MED ORDER — DOXYCYCLINE HYCLATE 50 MG PO CAPS: 100.0000 mg | ORAL_CAPSULE | Freq: Two times a day (BID) | ORAL | Status: AC

## 2011-08-28 MED ORDER — OXYCODONE HCL 5 MG PO TABS: 5.0000 mg | ORAL_TABLET | ORAL | Status: AC | PRN

## 2011-08-28 MED FILL — Mupirocin Oint 2%: CUTANEOUS | Qty: 22 | Status: AC

## 2011-08-28 NOTE — Progress Notes (Signed)
OT NOTE: OT consult received and appreciated. Discussed with P.T. And pt. And no acute OT needs noted as pt. Able to recall all techniques for completing ADLs from prior hospital stay and has all needed DME.   Will sign off acutely. Thanks!  Cassandria Anger, OTR/L Pager: [phone] 08/28/2011 .

## 2011-08-28 NOTE — Evaluation (Signed)
Physical Therapy Evaluation Patient Details Name: Leah Shah MRN: [id number] DOB: 1956/03/21 Today's Date: 08/28/2011  Problem List: There is no problem list on file for this patient.   Past Medical History:  Past Medical History  Diagnosis Date  . Heart valve problem     was on Redux and it caused some "heart valve damage"-does not see cardiologist  . Headache   . Anxiety   . Recurrent upper respiratory infection (URI)     treated /w OTC med.   . Arthritis     R knee, L knee - OA  . Hypertension     echoGavin Potters, wnl 2009? , had taken Redux for diet management  but now  off the market. pt, told that echo wnl.    . Dysrhythmia     "mild arrythmia after taking Redux diet med years ago"no cardio fi  . Shortness of breath     with exertion   Past Surgical History:  Past Surgical History  Procedure Date  . Abdominal hysterectomy     partial-uterus removed  . Tmj arthroplasty     [facility]- 1997  . Heel spur surgery   . Knee arthroscopy     right knee  . Coccyx removal     [facility]  . Eye surgery     states both lens replaced. Not sure if cataract surgery or not.  . Cholecystectomy     2011  . Knee arthroplasty 07/28/2011    Procedure: COMPUTER ASSISTED TOTAL KNEE ARTHROPLASTY;  Surgeon: Cammy Copa, MD;  Location: [facility] OR;  Service: Orthopedics;  Laterality: Right;  right total knee arthroplasty, computer assisted    PT Assessment/Plan/Recommendation PT Assessment Clinical Impression Statement: Pt. is familiar to this therapist from recent admission for TKR.  She is currently at [facility]. I level and does not need acute PT.  At the point that Dr. August Saucer allows her to resume progressive exercise of right LE, she will need OP PT, but no HH recommendations at this time.  Pt. anticipates DC home today with husband.  Will sign off.[name] PT Recommendation/Assessment: All further PT needs can be met in the next venue of care PT Recommendation Follow Up Recommendations: No PT follow  up (until cleared by Dr. August Saucer for resuming knee ex.) Equipment Recommended: None recommended by PT PT Goals  Acute Rehab PT Goals PT Goal Formulation:  (No goals set, DC'ing later today.)  PT Evaluation Precautions/Restrictions  Precautions Precautions: Knee Required Braces or Orthoses: Yes Restrictions Weight Bearing Restrictions: Yes RLE Weight Bearing: Weight bearing as tolerated Prior Functioning  Home Living Lives With: Spouse Receives Help From: Family Type of Home: House Home Layout: One level Home Access: Stairs to enter Entrance Stairs-Rails: None Entrance Stairs-Number of Steps: 2 Bathroom Shower/Tub: Forensic scientist: Standard Home Adaptive Equipment: Bedside commode/3-in-1;Walker - rolling;Shower chair with back Prior Function Level of Independence: Requires assistive device for independence;Independent with basic ADLs;Independent with homemaking with ambulation;Independent with gait;Independent with transfers Able to Take Stairs?: Yes Driving: No Vocation: On disability Cognition Cognition Arousal/Alertness: Awake/alert Overall Cognitive Status: Appears within functional limits for tasks assessed Orientation Level: Oriented X4 Sensation/Coordination Sensation Light Touch: Appears Intact (lower legs) Extremity Assessment RLE Assessment RLE Assessment: Not tested LLE Assessment LLE Assessment: Within Functional Limits Mobility (including Balance) Bed Mobility Bed Mobility: Yes Supine to Sit: 6: Modified independent (Device/Increase time) Transfers Transfers: Yes Sit to Stand: 6: Modified independent (Device/Increase time) Stand to Sit: 6: Modified independent (Device/Increase time) Ambulation/Gait Ambulation/Gait: Yes  Ambulation/Gait Assistance: 6: Modified independent (Device/Increase time) Ambulation Distance (Feet): 100 Feet Assistive device: Rolling walker Gait Pattern: Step-to pattern Stairs: Yes Stairs Assistance: 6:  Modified independent (Device/Increase time) Stair Management Technique: No rails;Forwards;Other (comment) (right minimal hand hold assist) Number of Stairs: 2     Exercise    End of Session PT - End of Session Equipment Utilized During Treatment: Right knee immobilizer Activity Tolerance: Patient tolerated treatment well Patient left: in chair;with call bell in reach Nurse Communication: Mobility status for transfers;Mobility status for ambulation;Weight bearing status General Behavior During Session: [facility] for tasks performed Cognition: [facility] for tasks performed  Ferman Hamming 08/28/2011, 9:53 AM Weldon Picking PT Acute Rehab Services [phone] Beeper [phone]

## 2011-08-28 NOTE — Op Note (Signed)
NAME:  JIRAH, RIDER NO.:  [id number]  MEDICAL RECORD NO.:  [id number]  LOCATION:  5008                         FACILITY:  [facility]  PHYSICIAN:  Burnard Bunting, M.D.    DATE OF BIRTH:  Apr 17, 1956  DATE OF PROCEDURE: DATE OF DISCHARGE:                              OPERATIVE REPORT   PREOPERATIVE DIAGNOSIS:  Right knee superficial infection.  POSTOPERATIVE DIAGNOSIS:  Right knee superficial infection.  PROCEDURE:  Right knee superficial I and D with knee aspirations.  SURGEON:  Burnard Bunting, M.D.  ASSISTANT:  None.  ANESTHESIA:  General endotracheal.  ESTIMATED BLOOD LOSS:  None.  SPECIMENS: 1. Cultures x2.  One knee aspirate sent for Gram stain, anaerobic and     aerobic culture and sensitivity. 2. Superficial infection area also cultured sent for Gram stain and     culture.  INDICATIONS:  Audry Kauzlarich is a 56 year old patient, 1 month out, right total knee replacement, had what appeared to be superficial infection at the inferior aspects of her incision, who presents now for operative I and D, and to make sure this does not communicate with deeper tissue.  PROCEDURE IN DETAIL:  The patient was brought to the operating room, where general endotracheal anesthesia was induced.  Preop antibiotics were not administered until cultures were obtained.  The right leg, including the foot was prepped with a CHG and draped in a sterile manner.  DuraPrep was then used to prep the lateral aspect of the leg. I did not want any tented prepping areas on the incision edges that I could follow the erythema.  The patient had 2 cm x 5 mm area of the inferior aspect of the incision which had broken open.  This area of the leg was elevated and tourniquet was inflated for a total of 6 minutes. This skin area was first excised and then extended proximally and distally, did not appear to communicate with the joint.  Thorough irrigation was then performed with 3 L of irrigating  solution.  Tourniquet was released, and the incision was then closed using 2-0 nylon in loose fashion.  The knee was aspirated through superolateral approach prior to any manipulation and debridement of the incision.  Bulky wrap was applied.  The patient tolerated procedure well without immediate complications.     Burnard Bunting, M.D.     GSD/MEDQ  D:  08/27/2011  T:  08/28/2011  Job:  [id number]

## 2011-08-28 NOTE — Progress Notes (Signed)
Subjective: Pt stable pain controlled   Objective: Vital signs in last 24 hours: Temp:  [97 F (36.1 C)-97.8 F (36.6 C)] 97.3 F (36.3 C) (02/22 0525) Pulse Rate:  [58-77] 69  (02/22 0525) Resp:  [16-20] 18  (02/22 0525) BP: (90-145)/(50-96) 107/61 mmHg (02/22 0525) SpO2:  [93 %-99 %] 93 % (02/22 0525) FiO2 (%):  [28 %] 28 % (02/21 2109) Weight:  [139.708 kg (308 lb)] 139.708 kg (308 lb) (02/21 1445)  Intake/Output from previous day: 02/21 0701 - 02/22 0700 In: 1351.3 [I.V.:1351.3] Out: -  Intake/Output this shift:    Exam:  Sensation intact distally Intact pulses distally Dorsiflexion/Plantar flexion intact  Labs:  Basename 08/28/11 0530 08/27/11 1428  HGB 11.2* 12.3    Basename 08/28/11 0530 08/27/11 1428  WBC 9.6 9.0  RBC 3.30* 3.52*  HCT 35.6* 37.6  PLT 191 205    Basename 08/27/11 1428  NA 140  K 4.6  CL 104  CO2 29  BUN 6  CREATININE 0.55  GLUCOSE 98  CALCIUM 9.3    Basename 08/28/11 0530 08/27/11 1428  LABPT -- --  INR 1.36 1.48    Assessment/Plan: Synovial wbc 1275 - no growth so far - on vanco - plan dc today on 7 d course of abx - cont knee immobilizer   Leah Shah 08/28/2011, 7:33 AM

## 2011-08-30 ENCOUNTER — Encounter (HOSPITAL_COMMUNITY): Payer: Self-pay | Admitting: Orthopedic Surgery

## 2011-08-30 LAB — WOUND CULTURE: Gram Stain: NONE SEEN

## 2011-08-31 LAB — BODY FLUID CULTURE: Culture: NO GROWTH

## 2011-09-04 LAB — ANAEROBIC CULTURE

## 2011-09-08 NOTE — Discharge Summary (Signed)
Physician Discharge Summary  Patient ID: Leah Shah MRN: [id number] DOB/AGE: 57-Dec-1957 57 y.o.  Admit date: 08/27/2011 Discharge date: 09/08/2011  Admission Diagnoses:  Superficial wound infection left knee  Discharge Diagnoses:  Same  Surgeries: Procedure(s): IRRIGATION AND DEBRIDEMENT EXTREMITY on 08/27/2011   Consultants:    Discharged Condition: Stable  Hospital Course: Leah Shah is an 56 y.o. female who was admitted 08/27/2011 with a chief complaint of knee pain infection, and found to have a diagnosis of superficial wound infection.  They were brought to the operating room on 08/27/2011 and underwent the above named procedures.    Antibiotics given:  Anti-infectives     Start     Dose/Rate Route Frequency Ordered Stop   08/28/11 0800   vancomycin (VANCOCIN) IVPB 1000 mg/200 mL premix        1,000 mg 200 mL/hr over 60 Minutes Intravenous Every 12 hours 08/27/11 2108 08/28/11 0907   08/28/11 0000   doxycycline (VIBRAMYCIN) 50 MG capsule        100 mg Oral 2 times daily 08/28/11 0743 09/04/11 2359        .  Recent vital signs:  Filed Vitals:   08/28/11 0525  BP: 107/61  Pulse: 69  Temp: 97.3 F (36.3 C)  Resp: 18    Recent laboratory studies:  Results for orders placed during the hospital encounter of 08/27/11  BASIC METABOLIC PANEL      Component Value Range   Sodium 140  135 - 145 (mEq/L)   Potassium 4.6  3.5 - 5.1 (mEq/L)   Chloride 104  96 - 112 (mEq/L)   CO2 29  19 - 32 (mEq/L)   Glucose, Bld 98  70 - 99 (mg/dL)   BUN 6  6 - 23 (mg/dL)   Creatinine, Ser 6.21  0.50 - 1.10 (mg/dL)   Calcium 9.3  8.4 - 30.8 (mg/dL)   GFR calc non Af Amer >90  >90 (mL/min)   GFR calc Af Amer >90  >90 (mL/min)  CBC      Component Value Range   WBC 9.0  4.0 - 10.5 (K/uL)   RBC 3.52 (*) 3.87 - 5.11 (MIL/uL)   Hemoglobin 12.3  12.0 - 15.0 (g/dL)   HCT 65.7  84.6 - 96.2 (%)   MCV 106.8 (*) 78.0 - 100.0 (fL)   MCH 34.9 (*) 26.0 - 34.0 (pg)   MCHC 32.7  30.0 -  36.0 (g/dL)   RDW 95.2  84.1 - 32.4 (%)   Platelets 205  150 - 400 (K/uL)  DIFFERENTIAL      Component Value Range   Neutrophils Relative 61  43 - 77 (%)   Neutro Abs 5.5  1.7 - 7.7 (K/uL)   Lymphocytes Relative 28  12 - 46 (%)   Lymphs Abs 2.6  0.7 - 4.0 (K/uL)   Monocytes Relative 7  3 - 12 (%)   Monocytes Absolute 0.6  0.1 - 1.0 (K/uL)   Eosinophils Relative 3  0 - 5 (%)   Eosinophils Absolute 0.3  0.0 - 0.7 (K/uL)   Basophils Relative 0  0 - 1 (%)   Basophils Absolute 0.0  0.0 - 0.1 (K/uL)  PROTIME-INR      Component Value Range   Prothrombin Time 18.2 (*) 11.6 - 15.2 (seconds)   INR 1.48  0.00 - 1.49   TYPE AND SCREEN      Component Value Range   ABO/RH(D) B POS     Antibody Screen NEG  Sample Expiration 08/30/2011    SURGICAL PCR SCREEN      Component Value Range   MRSA, PCR NEGATIVE  NEGATIVE    Staphylococcus aureus NEGATIVE  NEGATIVE   WOUND CULTURE      Component Value Range   Specimen Description WOUND KNEE RIGHT     Special Requests NONE     Gram Stain       Value: NO WBC SEEN     RARE SQUAMOUS EPITHELIAL CELLS PRESENT     NO ORGANISMS SEEN   Culture FEW ESCHERICHIA COLI     Report Status 08/30/2011 FINAL     Organism ID, Bacteria ESCHERICHIA COLI    ANAEROBIC CULTURE      Component Value Range   Specimen Description WOUND KNEE RIGHT     Special Requests NONE     Gram Stain       Value: NO WBC SEEN     RARE SQUAMOUS EPITHELIAL CELLS PRESENT     NO ORGANISMS SEEN   Culture       Value: MODERATE BACTEROIDES FRAGILIS     Note: BETA LACTAMASE POSITIVE   Report Status 09/04/2011 FINAL    CELL COUNT + DIFF,  W/ CRYST-SYNVL FLD      Component Value Range   Color, Synovial RED (*) YELLOW    Appearance-Synovial BLOODY (*) CLEAR    Crystals, Fluid NO CRYSTALS SEEN     WBC, Synovial 1275 (*) 0 - 200 (/cu mm)   Neutrophil, Synovial 88 (*) 0 - 25 (%)   Lymphocytes-Synovial Fld 4  0 - 20 (%)   Monocyte-Macrophage-Synovial Fluid 4 (*) 50 - 90 (%)    Eosinophils-Synovial 4 (*) 0 - 1 (%)  BODY FLUID CULTURE      Component Value Range   Specimen Description SYNOVIAL FLUID KNEE RIGHT     Special Requests 15CC SYR     Gram Stain       Value: FEW WBC PRESENT,BOTH PMN AND MONONUCLEAR     NO ORGANISMS SEEN   Culture NO GROWTH 3 DAYS     Report Status 08/31/2011 FINAL    AFB CULTURE WITH SMEAR      Component Value Range   Specimen Description SYNOVIAL FLUID KNEE RIGHT     Special Requests 15CC SYR     ACID FAST SMEAR NO ACID FAST BACILLI SEEN     Culture       Value: CULTURE WILL BE EXAMINED FOR 6 WEEKS BEFORE ISSUING A FINAL REPORT   Report Status PENDING    PROTIME-INR      Component Value Range   Prothrombin Time 17.0 (*) 11.6 - 15.2 (seconds)   INR 1.36  0.00 - 1.49   CBC      Component Value Range   WBC 9.6  4.0 - 10.5 (K/uL)   RBC 3.30 (*) 3.87 - 5.11 (MIL/uL)   Hemoglobin 11.2 (*) 12.0 - 15.0 (g/dL)   HCT 16.1 (*) 09.6 - 46.0 (%)   MCV 107.9 (*) 78.0 - 100.0 (fL)   MCH 33.9  26.0 - 34.0 (pg)   MCHC 31.5  30.0 - 36.0 (g/dL)   RDW 04.5  40.9 - 81.1 (%)   Platelets 191  150 - 400 (K/uL)  PATHOLOGIST SMEAR REVIEW      Component Value Range   Tech Review PREDOMINANTLY RBCs WITH      Discharge Medications:   Medication List  As of 09/08/2011 12:30 PM   STOP taking these medications  OxyCODONE HCl (Abuse Deter) 5 MG Tabs         TAKE these medications         alprazolam 2 MG tablet   Commonly known as: XANAX   Take 1 mg by mouth at bedtime as needed. For anxiety      atenolol 100 MG tablet   Commonly known as: TENORMIN   Take 100 mg by mouth daily at 2 PM daily at 2 PM.      hydrOXYzine 25 MG tablet   Commonly known as: ATARAX/VISTARIL   Take 50 mg by mouth at bedtime. scheduled      losartan 50 MG tablet   Commonly known as: COZAAR   Take 50 mg by mouth daily at 2 PM daily at 2 PM.      methocarbamol 500 MG tablet   Commonly known as: ROBAXIN   Take 500 mg by mouth every 8 (eight) hours as needed.  For muscle spasms      MULTIVITAMIN PO   Take 1 tablet by mouth daily.      simvastatin 40 MG tablet   Commonly known as: ZOCOR   Take 40 mg by mouth at bedtime.      traZODone 150 MG tablet   Commonly known as: DESYREL   Take 150 mg by mouth at bedtime.            Diagnostic Studies: No results found.  Disposition: 01-Home or Self Care  Discharge Orders    Future Orders Please Complete By Expires   Diet - low sodium heart healthy      Call MD / Call 911      Comments:   If you experience chest pain or shortness of breath, CALL 911 and be transported to the hospital emergency room.  If you develope a fever above 101 F, pus (white drainage) or increased drainage or redness at the wound, or calf pain, call your surgeon's office.   Constipation Prevention      Comments:   Drink plenty of fluids.  Prune juice may be helpful.  You may use a stool softener, such as Colace (over the counter) 100 mg twice a day.  Use MiraLax (over the counter) for constipation as needed.   Increase activity slowly as tolerated      Weight Bearing as taught in Physical Therapy      Comments:   Use a walker or crutches as instructed.   Discharge instructions      Comments:   1. Weight bearing as tolerated in knee immobilizer 2. Keep dressing on and keep dressing dry 3. Return to clinic Wednesday         Signed: Rise Paganini SCOTT 09/08/2011, 12:30 PM

## 2011-10-12 LAB — AFB CULTURE WITH SMEAR (NOT AT ARMC): Acid Fast Smear: NONE SEEN

## 2011-11-04 ENCOUNTER — Ambulatory Visit: Payer: Medicare HMO | Attending: Orthopedic Surgery | Admitting: Physical Therapy

## 2011-11-04 DIAGNOSIS — M25669 Stiffness of unspecified knee, not elsewhere classified: Secondary | ICD-10-CM | POA: Insufficient documentation

## 2011-11-04 DIAGNOSIS — M545 Low back pain, unspecified: Secondary | ICD-10-CM | POA: Insufficient documentation

## 2011-11-04 DIAGNOSIS — M25569 Pain in unspecified knee: Secondary | ICD-10-CM | POA: Insufficient documentation

## 2011-11-04 DIAGNOSIS — IMO0001 Reserved for inherently not codable concepts without codable children: Secondary | ICD-10-CM | POA: Insufficient documentation

## 2011-11-16 ENCOUNTER — Ambulatory Visit: Payer: Medicare HMO | Admitting: Physical Therapy

## 2011-11-18 ENCOUNTER — Encounter: Payer: Medicare HMO | Admitting: Physical Therapy

## 2011-11-23 ENCOUNTER — Ambulatory Visit: Payer: Medicare HMO | Admitting: Physical Therapy

## 2011-11-25 ENCOUNTER — Ambulatory Visit: Payer: Medicare HMO | Admitting: Physical Therapy

## 2011-12-01 ENCOUNTER — Ambulatory Visit: Payer: Medicare HMO | Admitting: Physical Therapy

## 2011-12-04 ENCOUNTER — Encounter: Payer: Medicare HMO | Admitting: Physical Therapy

## 2011-12-04 ENCOUNTER — Encounter: Payer: Self-pay | Admitting: Gastroenterology

## 2011-12-08 ENCOUNTER — Ambulatory Visit: Payer: Medicare HMO | Attending: Orthopedic Surgery | Admitting: Physical Therapy

## 2011-12-08 DIAGNOSIS — M25669 Stiffness of unspecified knee, not elsewhere classified: Secondary | ICD-10-CM | POA: Insufficient documentation

## 2011-12-08 DIAGNOSIS — IMO0001 Reserved for inherently not codable concepts without codable children: Secondary | ICD-10-CM | POA: Insufficient documentation

## 2011-12-08 DIAGNOSIS — M545 Low back pain, unspecified: Secondary | ICD-10-CM | POA: Insufficient documentation

## 2011-12-08 DIAGNOSIS — M25569 Pain in unspecified knee: Secondary | ICD-10-CM | POA: Insufficient documentation

## 2011-12-10 ENCOUNTER — Ambulatory Visit: Payer: Medicare HMO | Admitting: Physical Therapy

## 2011-12-15 ENCOUNTER — Encounter: Payer: Medicare HMO | Admitting: Physical Therapy

## 2011-12-17 ENCOUNTER — Encounter: Payer: Medicare HMO | Admitting: Physical Therapy

## 2011-12-18 ENCOUNTER — Encounter: Payer: Medicare HMO | Admitting: Physical Therapy

## 2011-12-24 ENCOUNTER — Ambulatory Visit: Payer: Medicare HMO | Admitting: Physical Therapy

## 2011-12-30 ENCOUNTER — Encounter: Payer: Medicare HMO | Admitting: Gastroenterology

## 2012-11-11 ENCOUNTER — Emergency Department: Payer: Self-pay | Admitting: Unknown Physician Specialty

## 2012-11-11 LAB — URINALYSIS, COMPLETE
Bilirubin,UR: NEGATIVE
Protein: NEGATIVE
Specific Gravity: 1.021 (ref 1.003–1.030)
Squamous Epithelial: 3

## 2012-11-11 LAB — COMPREHENSIVE METABOLIC PANEL
Calcium, Total: 8.6 mg/dL (ref 8.5–10.1)
Co2: 26 mmol/L (ref 21–32)
Glucose: 110 mg/dL — ABNORMAL HIGH (ref 65–99)
Potassium: 4 mmol/L (ref 3.5–5.1)
SGPT (ALT): 37 U/L (ref 12–78)
Sodium: 140 mmol/L (ref 136–145)
Total Protein: 7.4 g/dL (ref 6.4–8.2)

## 2012-11-11 LAB — CBC
HCT: 40.5 % (ref 35.0–47.0)
MCH: 34.7 pg — ABNORMAL HIGH (ref 26.0–34.0)
Platelet: 166 10*3/uL (ref 150–440)
RBC: 3.82 10*6/uL (ref 3.80–5.20)
RDW: 13.7 % (ref 11.5–14.5)
WBC: 9.2 10*3/uL (ref 3.6–11.0)

## 2012-11-11 LAB — TROPONIN I
Troponin-I: <0.02 ng/mL
Troponin-I: <0.02 ng/mL

## 2012-12-16 ENCOUNTER — Ambulatory Visit: Payer: Self-pay | Admitting: Family Medicine

## 2014-07-25 DIAGNOSIS — R69 Illness, unspecified: Secondary | ICD-10-CM | POA: Diagnosis not present

## 2014-07-25 DIAGNOSIS — I10 Essential (primary) hypertension: Secondary | ICD-10-CM | POA: Diagnosis not present

## 2014-07-25 DIAGNOSIS — M25569 Pain in unspecified knee: Secondary | ICD-10-CM | POA: Diagnosis not present

## 2014-08-16 DIAGNOSIS — F419 Anxiety disorder, unspecified: Secondary | ICD-10-CM | POA: Diagnosis not present

## 2014-08-16 DIAGNOSIS — G8929 Other chronic pain: Secondary | ICD-10-CM | POA: Diagnosis not present

## 2014-08-16 DIAGNOSIS — M545 Low back pain: Secondary | ICD-10-CM | POA: Diagnosis not present

## 2014-08-25 DIAGNOSIS — R69 Illness, unspecified: Secondary | ICD-10-CM | POA: Diagnosis not present

## 2014-08-25 DIAGNOSIS — M25569 Pain in unspecified knee: Secondary | ICD-10-CM | POA: Diagnosis not present

## 2014-08-25 DIAGNOSIS — I10 Essential (primary) hypertension: Secondary | ICD-10-CM | POA: Diagnosis not present

## 2014-09-14 DIAGNOSIS — F419 Anxiety disorder, unspecified: Secondary | ICD-10-CM | POA: Diagnosis not present

## 2014-09-14 DIAGNOSIS — M545 Low back pain: Secondary | ICD-10-CM | POA: Diagnosis not present

## 2014-09-14 DIAGNOSIS — G8929 Other chronic pain: Secondary | ICD-10-CM | POA: Diagnosis not present

## 2014-09-14 DIAGNOSIS — I10 Essential (primary) hypertension: Secondary | ICD-10-CM | POA: Diagnosis not present

## 2014-09-23 DIAGNOSIS — R69 Illness, unspecified: Secondary | ICD-10-CM | POA: Diagnosis not present

## 2014-09-23 DIAGNOSIS — M25569 Pain in unspecified knee: Secondary | ICD-10-CM | POA: Diagnosis not present

## 2014-09-23 DIAGNOSIS — I10 Essential (primary) hypertension: Secondary | ICD-10-CM | POA: Diagnosis not present

## 2014-10-15 DIAGNOSIS — F419 Anxiety disorder, unspecified: Secondary | ICD-10-CM | POA: Diagnosis not present

## 2014-10-15 DIAGNOSIS — M545 Low back pain: Secondary | ICD-10-CM | POA: Diagnosis not present

## 2014-10-15 DIAGNOSIS — G8929 Other chronic pain: Secondary | ICD-10-CM | POA: Diagnosis not present

## 2014-10-15 DIAGNOSIS — R229 Localized swelling, mass and lump, unspecified: Secondary | ICD-10-CM | POA: Diagnosis not present

## 2014-10-24 DIAGNOSIS — M25569 Pain in unspecified knee: Secondary | ICD-10-CM | POA: Diagnosis not present

## 2014-10-24 DIAGNOSIS — R69 Illness, unspecified: Secondary | ICD-10-CM | POA: Diagnosis not present

## 2014-10-24 DIAGNOSIS — I10 Essential (primary) hypertension: Secondary | ICD-10-CM | POA: Diagnosis not present

## 2014-11-14 DIAGNOSIS — F419 Anxiety disorder, unspecified: Secondary | ICD-10-CM | POA: Diagnosis not present

## 2014-11-14 DIAGNOSIS — G8929 Other chronic pain: Secondary | ICD-10-CM | POA: Diagnosis not present

## 2014-11-14 DIAGNOSIS — M545 Low back pain: Secondary | ICD-10-CM | POA: Diagnosis not present

## 2014-11-23 DIAGNOSIS — M25569 Pain in unspecified knee: Secondary | ICD-10-CM | POA: Diagnosis not present

## 2014-11-23 DIAGNOSIS — I10 Essential (primary) hypertension: Secondary | ICD-10-CM | POA: Diagnosis not present

## 2014-11-23 DIAGNOSIS — R69 Illness, unspecified: Secondary | ICD-10-CM | POA: Diagnosis not present

## 2014-12-13 ENCOUNTER — Ambulatory Visit (INDEPENDENT_AMBULATORY_CARE_PROVIDER_SITE_OTHER): Payer: Commercial Managed Care - HMO | Admitting: Family Medicine

## 2014-12-13 ENCOUNTER — Encounter: Payer: Self-pay | Admitting: Family Medicine

## 2014-12-13 ENCOUNTER — Encounter (INDEPENDENT_AMBULATORY_CARE_PROVIDER_SITE_OTHER): Payer: Self-pay

## 2014-12-13 VITALS — BP 110/58 | HR 83 | Resp 17 | Ht 66.0 in | Wt 304.4 lb

## 2014-12-13 DIAGNOSIS — F419 Anxiety disorder, unspecified: Secondary | ICD-10-CM | POA: Diagnosis not present

## 2014-12-13 DIAGNOSIS — M25462 Effusion, left knee: Secondary | ICD-10-CM

## 2014-12-13 DIAGNOSIS — M25562 Pain in left knee: Secondary | ICD-10-CM

## 2014-12-13 DIAGNOSIS — M542 Cervicalgia: Secondary | ICD-10-CM | POA: Diagnosis not present

## 2014-12-13 DIAGNOSIS — M545 Low back pain, unspecified: Secondary | ICD-10-CM

## 2014-12-13 DIAGNOSIS — F1120 Opioid dependence, uncomplicated: Secondary | ICD-10-CM | POA: Insufficient documentation

## 2014-12-13 DIAGNOSIS — G8929 Other chronic pain: Secondary | ICD-10-CM

## 2014-12-13 DIAGNOSIS — M25569 Pain in unspecified knee: Secondary | ICD-10-CM | POA: Insufficient documentation

## 2014-12-13 MED ORDER — CYCLOBENZAPRINE HCL 7.5 MG PO TABS: 7.5000 mg | ORAL_TABLET | Freq: Every day | ORAL | Status: DC

## 2014-12-13 MED ORDER — OXYCODONE HCL 5 MG PO TABS: 5.0000 mg | ORAL_TABLET | Freq: Three times a day (TID) | ORAL | Status: DC | PRN

## 2014-12-13 MED ORDER — ALPRAZOLAM 2 MG PO TABS: 2.0000 mg | ORAL_TABLET | Freq: Two times a day (BID) | ORAL | Status: DC | PRN

## 2014-12-13 NOTE — Progress Notes (Signed)
Name: Leah Shah   MRN: [id number]    DOB: [birth year]   Date:12/13/2014       Progress Note  Subjective  Chief Complaint  Chief Complaint  Patient presents with  . Pain    1 month follow up   . Anxiety    Anxiety Presents for follow-up visit. The problem has been unchanged. Symptoms include excessive worry, nervous/anxious behavior and panic. Patient reports no chest pain, feeling of choking, palpitations or shortness of breath.   Past treatments include benzodiazephines. The treatment provided significant relief. Compliance with prior treatments has been good.  Back Pain This is a chronic problem. The problem has been gradually worsening since onset. The pain is present in the sacro-iliac. The quality of the pain is described as shooting. The pain is at a severity of 7/10. The pain is moderate. The symptoms are aggravated by standing and bending. Associated symptoms include tingling (Tingling and numbness present in left hand's  fingers). Pertinent negatives include no bladder incontinence, bowel incontinence, chest pain, numbness or paresthesias. She has tried analgesics for the symptoms.  Knee Pain  The pain is present in the left leg. The pain is moderate. The pain has been worsening since onset. Associated symptoms include tingling (Tingling and numbness present in left hand's  fingers). Pertinent negatives include no inability to bear weight, loss of motion, loss of sensation or numbness. The symptoms are aggravated by movement.  Neck Pain  This is a new problem. The pain is present in the left side. The pain is moderate. Associated symptoms include tingling (Tingling and numbness present in left hand's  fingers). Pertinent negatives include no chest pain or numbness.      Past Medical History  Diagnosis Date  . Heart valve problem     was on Redux and it caused some "heart valve damage"-does not see cardiologist  . [id number](696.7)   . Anxiety   . Recurrent upper respiratory  infection (URI)     treated /w OTC med.   . Arthritis     R knee, L knee - OA  . Hypertension     echoJefm Bryant, wnl 2009? , had taken Redux for diet management  but now  off the market. pt, told that echo wnl.    . Dysrhythmia     "mild arrythmia after taking Redux diet med years ago"no cardio fi  . Shortness of breath     with exertion  . Hyperlipidemia    Past Surgical History  Procedure Laterality Date  . Abdominal hysterectomy      partial-uterus removed  . Tmj arthroplasty      [facility]- 1997  . Heel spur surgery    . Knee arthroscopy      right knee  . Coccyx removal      [facility]  . Eye surgery      states both lens replaced. Not sure if cataract surgery or not.  . Cholecystectomy      2011  . Knee arthroplasty  07/28/2011    Procedure: COMPUTER ASSISTED TOTAL KNEE ARTHROPLASTY;  Surgeon: [name], MD;  Location: [place];  Service: Orthopedics;  Laterality: Right;  right total knee arthroplasty, computer assisted  . I&d extremity  08/27/2011    Procedure: IRRIGATION AND DEBRIDEMENT EXTREMITY;  Surgeon: [name], MD;  Location: [place];  Service: Orthopedics;  Laterality: Right;   Family History  Problem Relation Age of Onset  . Heart disease Mother   . Hypertension Mother   .  Hypertension Father     History  Substance Use Topics  . Smoking status: Current Every Day Smoker -- 1.00 packs/day for 50 years  . Smokeless tobacco: Never Used  . Alcohol Use: No     Current outpatient prescriptions:  .  alprazolam (XANAX) 2 MG tablet, Take 1 tablet (2 mg total) by mouth 2 (two) times daily as needed for anxiety., Disp: 60 tablet, Rfl: 0 .  atenolol (TENORMIN) 100 MG tablet, Take 1 tablet by mouth daily., Disp: , Rfl:  .  cyclobenzaprine (FEXMID) 7.5 MG tablet, Take 1 tablet (7.5 mg total) by mouth at bedtime., Disp: 30 tablet, Rfl: 0 .  hydrochlorothiazide (HYDRODIURIL) 25 MG tablet, Take 1 tablet by mouth daily., Disp: , Rfl:  .  hydrOXYzine  (ATARAX/VISTARIL) 25 MG tablet, Take 1 tablet by mouth daily as needed., Disp: , Rfl:  .  losartan (COZAAR) 50 MG tablet, Take 1 tablet by mouth daily., Disp: , Rfl:  .  methocarbamol (ROBAXIN) 500 MG tablet, Take 500 mg by mouth every 8 (eight) hours as needed. For muscle spasms, Disp: , Rfl:  .  Multiple Vitamin (MULTIVITAMIN PO), Take 1 tablet by mouth daily. , Disp: , Rfl:  .  oxyCODONE (OXY IR/ROXICODONE) 5 MG immediate release tablet, Take 1 tablet (5 mg total) by mouth every 8 (eight) hours as needed for moderate pain., Disp: 90 tablet, Rfl: 0 .  simvastatin (ZOCOR) 40 MG tablet, Take 40 mg by mouth at bedtime.  , Disp: , Rfl:  .  traZODone (DESYREL) 150 MG tablet, Take 1 tablet by mouth at bedtime., Disp: , Rfl:   Allergies  Allergen Reactions  . Codeine     Itching "she can take some forms of it"  . Gabapentin Nausea Only  . Neosporin  [Neomycin-Bacitracin Zn-Polymyx]   . Pregabalin     stomach issues    Review of Systems  Respiratory: Negative for shortness of breath.   Cardiovascular: Negative for chest pain and palpitations.  Gastrointestinal: Negative for bowel incontinence.  Genitourinary: Negative for bladder incontinence.  Musculoskeletal: Positive for back pain, joint pain and neck pain.  Neurological: Positive for tingling (Tingling and numbness present in left hand's  fingers). Negative for numbness and paresthesias.  Psychiatric/Behavioral: Negative for depression. The patient is nervous/anxious.       Objective  Filed Vitals:   12/13/14 1431  BP: 110/58  Pulse: 83  Resp: 17  Height: 5\' 6"  (1.676 m)  Weight: 304 lb 6.4 oz (138.075 kg)  SpO2: 91%     Physical Exam  Constitutional: She is well-developed, well-nourished, and in no distress.  Cardiovascular: Normal rate, regular rhythm and normal heart sounds.   Pulmonary/Chest: Effort normal and breath sounds normal.  Musculoskeletal:       Left knee: She exhibits decreased range of motion and  swelling. She exhibits no deformity. Tenderness found.       Cervical back: She exhibits tenderness. She exhibits no swelling and no edema.       Lumbar back: She exhibits tenderness. She exhibits no swelling.       Back:  Nursing note and vitals reviewed.    Assessment & Plan 1. Cervical spine pain We will obtain x-ray of cervical spine and follow-up. - DG Cervical Spine Complete; Future  2. Pain and swelling of left knee Gradually worsening left knee pain. Patient is status post right total knee replacement and she does not wish to be referred to the same surgeon who performed her right knee  replacement. Because of worsening pain and swelling, we will obtain an x-ray of left knee and follow-up. - DG Knee Complete 4 Views Left; Future  3. Anxiety Symptomatically controlled on present therapy. Refills provided. - alprazolam (XANAX) 2 MG tablet; Take 1 tablet (2 mg total) by mouth 2 (two) times daily as needed for anxiety.  Dispense: 60 tablet; Refill: 0  4. Chronic LBP Chronic low back pain well controlled on present opioid therapy. No adverse effects. Patient taking the medication as directed and compliant with controlled substances agreement. Refills provided and follow-up in one month. - oxyCODONE (OXY IR/ROXICODONE) 5 MG immediate release tablet; Take 1 tablet (5 mg total) by mouth every 8 (eight) hours as needed for moderate pain.  Dispense: 90 tablet; Refill: 0 - cyclobenzaprine (FEXMID) 7.5 MG tablet; Take 1 tablet (7.5 mg total) by mouth at bedtime.  Dispense: 30 tablet; Refill: 0  There are no diagnoses linked to this encounter.   Drewey Begue [facility] A[facility] 12/13/2014 4:53 PM

## 2014-12-24 DIAGNOSIS — R69 Illness, unspecified: Secondary | ICD-10-CM | POA: Diagnosis not present

## 2015-01-10 ENCOUNTER — Encounter: Payer: Self-pay | Admitting: Family Medicine

## 2015-01-10 ENCOUNTER — Ambulatory Visit (INDEPENDENT_AMBULATORY_CARE_PROVIDER_SITE_OTHER): Payer: Commercial Managed Care - HMO | Admitting: Family Medicine

## 2015-01-10 VITALS — BP 117/77 | HR 90 | Temp 97.5°F | Resp 17 | Ht 66.0 in | Wt 302.9 lb

## 2015-01-10 DIAGNOSIS — G8929 Other chronic pain: Secondary | ICD-10-CM | POA: Diagnosis not present

## 2015-01-10 DIAGNOSIS — M545 Low back pain: Secondary | ICD-10-CM | POA: Diagnosis not present

## 2015-01-10 DIAGNOSIS — F32A Depression, unspecified: Secondary | ICD-10-CM | POA: Insufficient documentation

## 2015-01-10 DIAGNOSIS — F419 Anxiety disorder, unspecified: Secondary | ICD-10-CM

## 2015-01-10 DIAGNOSIS — G4700 Insomnia, unspecified: Secondary | ICD-10-CM | POA: Insufficient documentation

## 2015-01-10 DIAGNOSIS — I10 Essential (primary) hypertension: Secondary | ICD-10-CM | POA: Insufficient documentation

## 2015-01-10 DIAGNOSIS — Z720 Tobacco use: Secondary | ICD-10-CM | POA: Insufficient documentation

## 2015-01-10 DIAGNOSIS — M25569 Pain in unspecified knee: Secondary | ICD-10-CM | POA: Insufficient documentation

## 2015-01-10 DIAGNOSIS — F329 Major depressive disorder, single episode, unspecified: Secondary | ICD-10-CM | POA: Insufficient documentation

## 2015-01-10 MED ORDER — OXYCODONE HCL 5 MG PO TABS: 5.0000 mg | ORAL_TABLET | Freq: Three times a day (TID) | ORAL | Status: DC | PRN

## 2015-01-10 MED ORDER — ALPRAZOLAM 2 MG PO TABS: 2.0000 mg | ORAL_TABLET | Freq: Two times a day (BID) | ORAL | Status: DC | PRN

## 2015-01-10 NOTE — Progress Notes (Signed)
Name: Leah Shah   MRN: [id number]    DOB: 1955-10-02   Date:01/10/2015       Progress Note  Subjective  Chief Complaint  Chief Complaint  Patient presents with  . Follow-up    Pt needs referral    Back Pain This is Shah chronic problem. The pain is present in the lumbar spine. The quality of the pain is described as aching. The pain is at Shah severity of 9/10. The pain is worse during the night. The symptoms are aggravated by position and standing. Pertinent negatives include no bladder incontinence or bowel incontinence. She has tried analgesics for the symptoms. The treatment provided moderate relief.  Anxiety Presents for follow-up visit. Symptoms include excessive worry, hyperventilation, nervous/anxious behavior, panic and shortness of breath. Symptoms occur most days.   Past treatments include benzodiazephines. The treatment provided significant relief. Compliance with prior treatments has been good.      Past Medical History  Diagnosis Date  . Heart valve problem     was on Redux and it caused some "heart valve damage"-does not see cardiologist  . [id number](478.2)   . Anxiety   . Recurrent upper respiratory infection (URI)     treated /w OTC med.   . Arthritis     R knee, L knee - OA  . Hypertension     echoJefm Bryant, wnl 2009? , had taken Redux for diet management  but now  off the market. pt, told that echo wnl.    . Dysrhythmia     "mild arrythmia after taking Redux diet med years ago"no cardio fi  . Shortness of breath     with exertion  . Hyperlipidemia     Past Surgical History  Procedure Laterality Date  . Abdominal hysterectomy      partial-uterus removed  . Tmj arthroplasty      [facility]- 1997  . Heel spur surgery    . Knee arthroscopy      right knee  . Coccyx removal      [facility]  . Eye surgery      states both lens replaced. Not sure if cataract surgery or not.  . Cholecystectomy      2011  . Knee arthroplasty  07/28/2011    Procedure: COMPUTER  ASSISTED TOTAL KNEE ARTHROPLASTY;  Surgeon: [name], MD;  Location: [place];  Service: Orthopedics;  Laterality: Right;  right total knee arthroplasty, computer assisted  . I&d extremity  08/27/2011    Procedure: IRRIGATION AND DEBRIDEMENT EXTREMITY;  Surgeon: [name], MD;  Location: [place];  Service: Orthopedics;  Laterality: Right;    Family History  Problem Relation Age of Onset  . Heart disease Mother   . Hypertension Mother   . Hypertension Father     History   Social History  . Marital Status: Married    Spouse Name: N/Shah  . Number of Children: N/Shah  . Years of Education: N/Shah   Occupational History  . Not on file.   Social History Main Topics  . Smoking status: Current Every Day Smoker -- 1.00 packs/day for 50 years  . Smokeless tobacco: Never Used  . Alcohol Use: No  . Drug Use: No  . Sexual Activity: Not Currently   Other Topics Concern  . Not on file   Social History Narrative     Current outpatient prescriptions:  .  alprazolam (XANAX) 2 MG tablet, Take 1 tablet (2 mg total) by mouth 2 (two) times  daily as needed for anxiety., Disp: 60 tablet, Rfl: 0 .  atenolol (TENORMIN) 100 MG tablet, Take 1 tablet by mouth daily., Disp: , Rfl:  .  cyclobenzaprine (FEXMID) 7.5 MG tablet, Take 1 tablet (7.5 mg total) by mouth at bedtime., Disp: 30 tablet, Rfl: 0 .  hydrochlorothiazide (HYDRODIURIL) 25 MG tablet, Take 1 tablet by mouth daily., Disp: , Rfl:  .  hydrOXYzine (ATARAX/VISTARIL) 25 MG tablet, Take 1 tablet by mouth daily as needed., Disp: , Rfl:  .  losartan (COZAAR) 50 MG tablet, Take 1 tablet by mouth daily., Disp: , Rfl:  .  methocarbamol (ROBAXIN) 500 MG tablet, Take 500 mg by mouth every 8 (eight) hours as needed. For muscle spasms, Disp: , Rfl:  .  oxyCODONE (OXY IR/ROXICODONE) 5 MG immediate release tablet, Take 1 tablet (5 mg total) by mouth every 8 (eight) hours as needed for moderate pain., Disp: 90 tablet, Rfl: 0 .  simvastatin (ZOCOR) 40  MG tablet, Take 40 mg by mouth at bedtime.  , Disp: , Rfl:  .  traZODone (DESYREL) 150 MG tablet, Take 1 tablet by mouth at bedtime., Disp: , Rfl:  .  Multiple Vitamin (MULTIVITAMIN PO), Take 1 tablet by mouth daily. , Disp: , Rfl:   Allergies  Allergen Reactions  . Codeine     Itching "she can take some forms of it"  . Gabapentin Nausea Only  . Neosporin  [Neomycin-Bacitracin Zn-Polymyx]   . Pregabalin     stomach issues     Review of Systems  Respiratory: Positive for shortness of breath.   Gastrointestinal: Negative for bowel incontinence.  Genitourinary: Negative for bladder incontinence.  Musculoskeletal: Positive for back pain.  Psychiatric/Behavioral: The patient is nervous/anxious.       Objective  Filed Vitals:   01/10/15 1528  BP: 117/77  Pulse: 90  Temp: 97.5 F (36.4 C)  TempSrc: Oral  Resp: 17  Height: 5\' 6"  (1.676 m)  Weight: 302 lb 14.4 oz (137.395 kg)  SpO2: 87%    Physical Exam  Constitutional: She is well-developed, well-nourished, and in no distress.  Cardiovascular: Normal rate and regular rhythm.   Musculoskeletal:       Lumbar back: She exhibits tenderness and pain.       Back:  Nursing note and vitals reviewed.    Assessment & Plan 1. Chronic LBP Chronic lumbosacral spine pain, well controlled on present daily opioid therapy. Patient is aware of the potential adverse effects and the dependence potential of opioids. Refills provided. - oxyCODONE (OXY IR/ROXICODONE) 5 MG immediate release tablet; Take 1 tablet (5 mg total) by mouth every 8 (eight) hours as needed for moderate pain.  Dispense: 90 tablet; Refill: 0  2. Anxiety Symptoms of chronic anxiety, responsive to benzodiazepine therapy. Patient is aware of the tolerance potential and adverse effects of benzodiazepines. Refills provided. - alprazolam (XANAX) 2 MG tablet; Take 1 tablet (2 mg total) by mouth 2 (two) times daily as needed for anxiety.  Dispense: 60 tablet; Refill:  0   Leah Shah. [facility] 01/10/2015 3:54 PM

## 2015-01-23 DIAGNOSIS — L859 Epidermal thickening, unspecified: Secondary | ICD-10-CM | POA: Diagnosis not present

## 2015-01-23 DIAGNOSIS — L719 Rosacea, unspecified: Secondary | ICD-10-CM | POA: Diagnosis not present

## 2015-01-23 DIAGNOSIS — L700 Acne vulgaris: Secondary | ICD-10-CM | POA: Diagnosis not present

## 2015-01-23 DIAGNOSIS — L820 Inflamed seborrheic keratosis: Secondary | ICD-10-CM | POA: Diagnosis not present

## 2015-01-23 DIAGNOSIS — R69 Illness, unspecified: Secondary | ICD-10-CM | POA: Diagnosis not present

## 2015-01-28 ENCOUNTER — Other Ambulatory Visit: Payer: Self-pay | Admitting: Family Medicine

## 2015-01-28 DIAGNOSIS — G8929 Other chronic pain: Secondary | ICD-10-CM

## 2015-01-28 DIAGNOSIS — M545 Low back pain, unspecified: Secondary | ICD-10-CM

## 2015-01-31 ENCOUNTER — Telehealth: Payer: Self-pay | Admitting: Family Medicine

## 2015-01-31 DIAGNOSIS — G8929 Other chronic pain: Secondary | ICD-10-CM | POA: Insufficient documentation

## 2015-01-31 DIAGNOSIS — M25562 Pain in left knee: Principal | ICD-10-CM

## 2015-01-31 MED ORDER — ATENOLOL 100 MG PO TABS: 100.0000 mg | ORAL_TABLET | Freq: Every day | ORAL | Status: DC

## 2015-01-31 NOTE — Telephone Encounter (Signed)
Referral has been ordered, please set up for appointment.

## 2015-01-31 NOTE — Telephone Encounter (Signed)
Medication has been refilled. I had to cancel first refill to [facility] and reorder and send it to [facility]

## 2015-01-31 NOTE — Telephone Encounter (Signed)
Patient wants to know if she can get a referral to see a doctor concerning her knees, Routed call to Dr. Manuella Ghazi.

## 2015-02-07 ENCOUNTER — Ambulatory Visit: Payer: Commercial Managed Care - HMO | Admitting: Family Medicine

## 2015-02-12 ENCOUNTER — Ambulatory Visit: Payer: Commercial Managed Care - HMO | Admitting: Family Medicine

## 2015-02-12 ENCOUNTER — Encounter: Payer: Self-pay | Admitting: Family Medicine

## 2015-02-13 ENCOUNTER — Encounter: Payer: Self-pay | Admitting: Family Medicine

## 2015-02-13 ENCOUNTER — Ambulatory Visit (INDEPENDENT_AMBULATORY_CARE_PROVIDER_SITE_OTHER): Payer: Commercial Managed Care - HMO | Admitting: Family Medicine

## 2015-02-13 VITALS — BP 120/71 | HR 83 | Temp 98.4°F | Resp 20 | Ht 66.0 in | Wt 310.6 lb

## 2015-02-13 DIAGNOSIS — M25562 Pain in left knee: Secondary | ICD-10-CM | POA: Diagnosis not present

## 2015-02-13 DIAGNOSIS — M1712 Unilateral primary osteoarthritis, left knee: Secondary | ICD-10-CM | POA: Diagnosis not present

## 2015-02-13 DIAGNOSIS — F419 Anxiety disorder, unspecified: Secondary | ICD-10-CM | POA: Diagnosis not present

## 2015-02-13 DIAGNOSIS — M545 Low back pain: Secondary | ICD-10-CM | POA: Diagnosis not present

## 2015-02-13 DIAGNOSIS — G8929 Other chronic pain: Secondary | ICD-10-CM

## 2015-02-13 MED ORDER — OXYCODONE HCL 5 MG PO TABS: 5.0000 mg | ORAL_TABLET | Freq: Three times a day (TID) | ORAL | Status: DC | PRN

## 2015-02-13 MED ORDER — ALPRAZOLAM 2 MG PO TABS: 2.0000 mg | ORAL_TABLET | Freq: Two times a day (BID) | ORAL | Status: DC | PRN

## 2015-02-13 NOTE — Progress Notes (Signed)
Name: Leah Shah   MRN: [id number]    DOB: Nov 27, 1955   Date:02/13/2015       Progress Note  Subjective  Chief Complaint  Chief Complaint  Patient presents with  . Follow-up    4 wk  . Anxiety  . Medication Refill    oxycodone 5mg  / alprazolam 2mg     Anxiety Presents for follow-up visit. Symptoms include excessive worry, nervous/anxious behavior and panic. Patient reports no shortness of breath. Symptoms occur most days.   Past treatments include benzodiazephines. The treatment provided significant relief. Compliance with prior treatments has been good.  Back Pain This is a chronic problem. The pain is present in the lumbar spine. The quality of the pain is described as aching. The pain is at a severity of 1/10. The pain is worse during the night. The symptoms are aggravated by position and standing. Pertinent negatives include no bladder incontinence, bowel incontinence or leg pain. She has tried analgesics for the symptoms. The treatment provided moderate relief.     Past Medical History  Diagnosis Date  . Heart valve problem     was on Redux and it caused some "heart valve damage"-does not see cardiologist  . PFXTKWIO(973.5)   . Anxiety   . Recurrent upper respiratory infection (URI)     treated /w OTC med.   . Arthritis     R knee, L knee - OA  . Hypertension     echoJefm Bryant, wnl 2009? , had taken Redux for diet management  but now  off the market. pt, told that echo wnl.    . Dysrhythmia     "mild arrythmia after taking Redux diet med years ago"no cardio fi  . Shortness of breath     with exertion  . Hyperlipidemia     Past Surgical History  Procedure Laterality Date  . Abdominal hysterectomy      partial-uterus removed  . Tmj arthroplasty      [facility]- 1997  . Heel spur surgery    . Knee arthroscopy      right knee  . Coccyx removal      [facility]  . Eye surgery      states both lens replaced. Not sure if cataract surgery or not.  . Cholecystectomy        2011  . Knee arthroplasty  07/28/2011    Procedure: COMPUTER ASSISTED TOTAL KNEE ARTHROPLASTY;  Surgeon: [name], MD;  Location: [place];  Service: Orthopedics;  Laterality: Right;  right total knee arthroplasty, computer assisted  . I&d extremity  08/27/2011    Procedure: IRRIGATION AND DEBRIDEMENT EXTREMITY;  Surgeon: [name], MD;  Location: [place];  Service: Orthopedics;  Laterality: Right;    Family History  Problem Relation Age of Onset  . Heart disease Mother   . Hypertension Mother   . Hypertension Father     Social History   Social History  . Marital Status: Married    Spouse Name: N/A  . Number of Children: N/A  . Years of Education: N/A   Occupational History  . Not on file.   Social History Main Topics  . Smoking status: Current Every Day Smoker -- 1.00 packs/day for 50 years  . Smokeless tobacco: Never Used  . Alcohol Use: No  . Drug Use: No  . Sexual Activity: Not Currently   Other Topics Concern  . Not on file   Social History Narrative     Current outpatient prescriptions:  .  alprazolam (XANAX) 2 MG tablet, Take 1 tablet (2 mg total) by mouth 2 (two) times daily as needed for anxiety., Disp: 60 tablet, Rfl: 0 .  atenolol (TENORMIN) 100 MG tablet, Take 1 tablet (100 mg total) by mouth daily., Disp: 30 tablet, Rfl: 0 .  cyclobenzaprine (FLEXERIL) 5 MG tablet, Take 1.5 tablets (7.5 mg total) by mouth at bedtime., Disp: 45 tablet, Rfl: 2 .  hydrochlorothiazide (HYDRODIURIL) 25 MG tablet, Take 1 tablet by mouth daily., Disp: , Rfl:  .  hydrOXYzine (ATARAX/VISTARIL) 25 MG tablet, Take 1 tablet by mouth daily as needed., Disp: , Rfl:  .  losartan (COZAAR) 50 MG tablet, Take 1 tablet by mouth daily., Disp: , Rfl:  .  Multiple Vitamin (MULTIVITAMIN PO), Take 1 tablet by mouth daily. , Disp: , Rfl:  .  oxyCODONE (OXY IR/ROXICODONE) 5 MG immediate release tablet, Take 1 tablet (5 mg total) by mouth every 8 (eight) hours as needed for moderate  pain., Disp: 90 tablet, Rfl: 0 .  simvastatin (ZOCOR) 40 MG tablet, Take 40 mg by mouth at bedtime.  , Disp: , Rfl:  .  traZODone (DESYREL) 150 MG tablet, Take 1 tablet by mouth at bedtime., Disp: , Rfl:  .  atenolol (TENORMIN) 100 MG tablet, Take 1 tablet (100 mg total) by mouth daily. (Patient not taking: Reported on 02/13/2015), Disp: 30 tablet, Rfl: 0 .  methocarbamol (ROBAXIN) 500 MG tablet, Take 500 mg by mouth every 8 (eight) hours as needed. For muscle spasms, Disp: , Rfl:   Allergies  Allergen Reactions  . Codeine     Itching "she can take some forms of it"  . Gabapentin Nausea Only  . Neosporin  [Neomycin-Bacitracin Zn-Polymyx]   . Pregabalin     stomach issues     Review of Systems  Respiratory: Negative for shortness of breath.   Gastrointestinal: Negative for bowel incontinence.  Genitourinary: Negative for bladder incontinence.  Musculoskeletal: Positive for back pain.  Psychiatric/Behavioral: The patient is nervous/anxious.       Objective  Filed Vitals:   02/13/15 1016  BP: 120/71  Pulse: 83  Temp: 98.4 F (36.9 C)  TempSrc: Oral  Resp: 20  Height: 5\' 6"  (1.676 m)  Weight: 310 lb 9.6 oz (140.887 kg)  SpO2: 90%    Physical Exam  Constitutional: She is well-developed, well-nourished, and in no distress.  Cardiovascular: Normal rate and regular rhythm.   Pulmonary/Chest: Effort normal and breath sounds normal.  Musculoskeletal:       Lumbar back: She exhibits tenderness and pain.       Back:  Nursing note and vitals reviewed.   Assessment & Plan  1. Anxiety Symptoms responsive to benzodiazepine therapy. Patient is compliant with controlled substances agreement and is aware of the potential interactions between benzodiazepines and opioids. She is taking the medications as directed and only as needed. Refills provided and follow-up in one month. - alprazolam (XANAX) 2 MG tablet; Take 1 tablet (2 mg total) by mouth 2 (two) times daily as needed for  anxiety.  Dispense: 60 tablet; Refill: 0  2. Chronic LBP Pain is controlled on present opioid therapy. She is aware of potential interactions between opioids and benzodiazepines. Refills provided and follow-up in one month - oxyCODONE (OXY IR/ROXICODONE) 5 MG immediate release tablet; Take 1 tablet (5 mg total) by mouth every 8 (eight) hours as needed for moderate pain.  Dispense: 90 tablet; Refill: 0   Karianne Nogueira [facility] 02/13/2015 10:56 AM

## 2015-02-23 DIAGNOSIS — R69 Illness, unspecified: Secondary | ICD-10-CM | POA: Diagnosis not present

## 2015-03-06 ENCOUNTER — Telehealth: Payer: Self-pay | Admitting: Family Medicine

## 2015-03-06 MED ORDER — ATENOLOL 100 MG PO TABS: 100.0000 mg | ORAL_TABLET | Freq: Every day | ORAL | Status: DC

## 2015-03-06 NOTE — Telephone Encounter (Signed)
Medication has been refilled and sent to [facility] °

## 2015-03-14 ENCOUNTER — Encounter: Payer: Self-pay | Admitting: Family Medicine

## 2015-03-14 ENCOUNTER — Telehealth: Payer: Self-pay | Admitting: Family Medicine

## 2015-03-14 ENCOUNTER — Ambulatory Visit (INDEPENDENT_AMBULATORY_CARE_PROVIDER_SITE_OTHER): Payer: Commercial Managed Care - HMO | Admitting: Family Medicine

## 2015-03-14 VITALS — BP 121/73 | HR 76 | Temp 97.8°F | Resp 18 | Ht 66.0 in | Wt 308.8 lb

## 2015-03-14 DIAGNOSIS — F419 Anxiety disorder, unspecified: Secondary | ICD-10-CM

## 2015-03-14 DIAGNOSIS — G8929 Other chronic pain: Secondary | ICD-10-CM

## 2015-03-14 DIAGNOSIS — E785 Hyperlipidemia, unspecified: Secondary | ICD-10-CM | POA: Diagnosis not present

## 2015-03-14 DIAGNOSIS — M545 Low back pain: Secondary | ICD-10-CM

## 2015-03-14 MED ORDER — ALPRAZOLAM 2 MG PO TABS: 2.0000 mg | ORAL_TABLET | Freq: Two times a day (BID) | ORAL | Status: DC | PRN

## 2015-03-14 MED ORDER — OXYCODONE HCL 5 MG PO TABS: 5.0000 mg | ORAL_TABLET | Freq: Three times a day (TID) | ORAL | Status: DC | PRN

## 2015-03-14 MED ORDER — SIMVASTATIN 40 MG PO TABS: 40.0000 mg | ORAL_TABLET | Freq: Every day | ORAL | Status: DC

## 2015-03-14 NOTE — Telephone Encounter (Signed)
Routed to Dr. Shah °

## 2015-03-14 NOTE — Progress Notes (Signed)
Name: Leah Shah   MRN: [id number]    DOB: 01-14-1956   Date:03/14/2015       Progress Note  Subjective  Chief Complaint  Chief Complaint  Patient presents with  . Follow-up    1 mo.  . Medication Refill    Alprazolam 2 mg / oxycodone 5 mg / sinvastatin 40 mg    Back Pain This is a chronic problem. The pain is present in the lumbar spine and sacro-iliac. The pain does not radiate. The pain is at a severity of 3/10. The symptoms are aggravated by bending and position. Pertinent negatives include no bladder incontinence, bowel incontinence, chest pain or leg pain. She has tried analgesics for the symptoms.  Anxiety Presents for follow-up visit. Symptoms include excessive worry, insomnia, irritability and nervous/anxious behavior. Patient reports no chest pain or shortness of breath.   Past treatments include benzodiazephines.  Hyperlipidemia This is a chronic problem. The problem is controlled. Pertinent negatives include no chest pain, leg pain, myalgias or shortness of breath. Current antihyperlipidemic treatment includes statins.    Past Medical History  Diagnosis Date  . Heart valve problem     was on Redux and it caused some "heart valve damage"-does not see cardiologist  . [id number](253.6)   . Anxiety   . Recurrent upper respiratory infection (URI)     treated /w OTC med.   . Arthritis     R knee, L knee - OA  . Hypertension     echoJefm Bryant, wnl 2009? , had taken Redux for diet management  but now  off the market. pt, told that echo wnl.    . Dysrhythmia     "mild arrythmia after taking Redux diet med years ago"no cardio fi  . Shortness of breath     with exertion  . Hyperlipidemia     Past Surgical History  Procedure Laterality Date  . Abdominal hysterectomy      partial-uterus removed  . Tmj arthroplasty      [facility]- [facility]  . Heel spur surgery    . Knee arthroscopy      right knee  . Coccyx removal      [facility]  . Eye surgery      states both lens  replaced. Not sure if cataract surgery or not.  . Cholecystectomy      2011  . Knee arthroplasty  07/28/2011    Procedure: COMPUTER ASSISTED TOTAL KNEE ARTHROPLASTY;  Surgeon: [name], MD;  Location: [place];  Service: Orthopedics;  Laterality: Right;  right total knee arthroplasty, computer assisted  . I&d extremity  08/27/2011    Procedure: IRRIGATION AND DEBRIDEMENT EXTREMITY;  Surgeon: [name], MD;  Location: [place];  Service: Orthopedics;  Laterality: Right;    Family History  Problem Relation Age of Onset  . Heart disease Mother   . Hypertension Mother   . Hypertension Father     Social History   Social History  . Marital Status: Married    Spouse Name: N/A  . Number of Children: N/A  . Years of Education: N/A   Occupational History  . Not on file.   Social History Main Topics  . Smoking status: Current Every Day Smoker -- 1.00 packs/day for 50 years  . Smokeless tobacco: Never Used  . Alcohol Use: No  . Drug Use: No  . Sexual Activity: Not Currently   Other Topics Concern  . Not on file   Social History Narrative  Current outpatient prescriptions:  .  alprazolam (XANAX) 2 MG tablet, Take 1 tablet (2 mg total) by mouth 2 (two) times daily as needed for anxiety., Disp: 60 tablet, Rfl: 0 .  atenolol (TENORMIN) 100 MG tablet, Take 1 tablet (100 mg total) by mouth daily., Disp: 30 tablet, Rfl: 0 .  atenolol (TENORMIN) 100 MG tablet, Take 1 tablet (100 mg total) by mouth daily., Disp: 30 tablet, Rfl: 0 .  atenolol (TENORMIN) 100 MG tablet, Take 1 tablet (100 mg total) by mouth daily., Disp: 30 tablet, Rfl: 2 .  cyclobenzaprine (FLEXERIL) 5 MG tablet, Take 1.5 tablets (7.5 mg total) by mouth at bedtime., Disp: 45 tablet, Rfl: 2 .  hydrochlorothiazide (HYDRODIURIL) 25 MG tablet, Take 1 tablet by mouth daily., Disp: , Rfl:  .  hydrOXYzine (ATARAX/VISTARIL) 25 MG tablet, Take 1 tablet by mouth daily as needed., Disp: , Rfl:  .  losartan (COZAAR) 50 MG  tablet, Take 1 tablet by mouth daily., Disp: , Rfl:  .  methocarbamol (ROBAXIN) 500 MG tablet, Take 500 mg by mouth every 8 (eight) hours as needed. For muscle spasms, Disp: , Rfl:  .  Multiple Vitamin (MULTIVITAMIN PO), Take 1 tablet by mouth daily. , Disp: , Rfl:  .  oxyCODONE (OXY IR/ROXICODONE) 5 MG immediate release tablet, Take 1 tablet (5 mg total) by mouth every 8 (eight) hours as needed for moderate pain., Disp: 90 tablet, Rfl: 0 .  simvastatin (ZOCOR) 40 MG tablet, Take 40 mg by mouth at bedtime.  , Disp: , Rfl:  .  traZODone (DESYREL) 150 MG tablet, Take 1 tablet by mouth at bedtime., Disp: , Rfl:   Allergies  Allergen Reactions  . Bacitracin-Neomycin-Polymyxin Other (See Comments)  . Codeine Other (See Comments)    Itching "she can take some forms of it" Itching "she can take some forms of it"  . Gabapentin Nausea Only  . Neosporin  [Neomycin-Bacitracin Zn-Polymyx]   . Pregabalin Other (See Comments)    stomach issues stomach issues     Review of Systems  Constitutional: Positive for irritability.  Respiratory: Negative for shortness of breath.   Cardiovascular: Negative for chest pain.  Gastrointestinal: Negative for bowel incontinence.  Genitourinary: Negative for bladder incontinence.  Musculoskeletal: Positive for back pain. Negative for myalgias.  Psychiatric/Behavioral: The patient is nervous/anxious and has insomnia.    Objective  Filed Vitals:   03/14/15 1208  BP: 121/73  Pulse: 76  Temp: 97.8 F (36.6 C)  TempSrc: Oral  Resp: 18  Height: 5\' 6"  (1.676 m)  Weight: 308 lb 12.8 oz (140.071 kg)  SpO2: 90%    Physical Exam  Constitutional: She is oriented to person, place, and time and well-developed, well-nourished, and in no distress.  HENT:  Head: Normocephalic and atraumatic.  Cardiovascular: Normal rate and regular rhythm.   Pulmonary/Chest: Effort normal and breath sounds normal.  Abdominal: Soft. Bowel sounds are normal.  Musculoskeletal:        Lumbar back: She exhibits tenderness and pain.       Back:  Neurological: She is alert and oriented to person, place, and time.  Nursing note and vitals reviewed.  Assessment & Plan  1. Chronic LBP Monica lumbosacral pain responsive to chronic opioid therapy. Patient compliant with controlled substances agreement. She is aware of the dependence potential and interactions of opioids especially with benzodiazepines. Refills provided and follow-up in one month.  - oxyCODONE (OXY IR/ROXICODONE) 5 MG immediate release tablet; Take 1 tablet (5 mg total) by mouth  every 8 (eight) hours as needed for moderate pain.  Dispense: 90 tablet; Refill: 0  2. Anxiety Symptoms of anxiety are stable and responsive to alprazolam 2 mg twice a day when necessary. Patient is compliant with controlled substances agreement and is aware of the dependence potential and the drug interactions of benzodiazepines, specially with opioids. Refills provided and follow-up in one month. - alprazolam (XANAX) 2 MG tablet; Take 1 tablet (2 mg total) by mouth 2 (two) times daily as needed for anxiety.  Dispense: 60 tablet; Refill: 0  3. Hyperlipidemia  - simvastatin (ZOCOR) 40 MG tablet; Take 1 tablet (40 mg total) by mouth at bedtime.  Dispense: 90 tablet; Refill: 0 - Lipid Profile - Comprehensive Metabolic Panel (CMET)   Sheva Mcdougle Asad A[facility] 03/14/2015 12:38 PM

## 2015-03-14 NOTE — Telephone Encounter (Signed)
Leah Shah from [place] states that there is black box warning for xanex and oxy. Stating that you will not be able to prescribe both prescriptions together. Would like to know until this actually go into effect, would you like for these two prescriptions written out for 30 day and how long will this patient need these prescriptions

## 2015-03-14 NOTE — Telephone Encounter (Signed)
Prescriptions for Xanax and oxycodone were written for 30 days each during today's office visit appointment.

## 2015-03-15 NOTE — Telephone Encounter (Signed)
Per Dr. Manuella Ghazi prescriptions for Xanax and oxycodone were written for 30 days each during today's office visit appointment.

## 2015-03-18 ENCOUNTER — Ambulatory Visit: Payer: Commercial Managed Care - HMO | Admitting: Family Medicine

## 2015-03-26 DIAGNOSIS — R69 Illness, unspecified: Secondary | ICD-10-CM | POA: Diagnosis not present

## 2015-04-10 ENCOUNTER — Telehealth: Payer: Self-pay | Admitting: Family Medicine

## 2015-04-10 MED ORDER — LOSARTAN POTASSIUM 50 MG PO TABS: 50.0000 mg | ORAL_TABLET | Freq: Every day | ORAL | Status: DC

## 2015-04-10 NOTE — Telephone Encounter (Signed)
Losartan Potassium 50 mg has been refilled and sent to [facility]

## 2015-04-15 ENCOUNTER — Encounter: Payer: Self-pay | Admitting: Family Medicine

## 2015-04-15 ENCOUNTER — Ambulatory Visit (INDEPENDENT_AMBULATORY_CARE_PROVIDER_SITE_OTHER): Payer: Commercial Managed Care - HMO | Admitting: Family Medicine

## 2015-04-15 VITALS — BP 118/80 | HR 78 | Temp 98.0°F | Resp 18 | Ht 66.0 in | Wt 314.2 lb

## 2015-04-15 DIAGNOSIS — R7989 Other specified abnormal findings of blood chemistry: Secondary | ICD-10-CM | POA: Insufficient documentation

## 2015-04-15 DIAGNOSIS — Z7409 Other reduced mobility: Secondary | ICD-10-CM | POA: Insufficient documentation

## 2015-04-15 DIAGNOSIS — G8929 Other chronic pain: Secondary | ICD-10-CM | POA: Diagnosis not present

## 2015-04-15 DIAGNOSIS — M545 Low back pain, unspecified: Secondary | ICD-10-CM

## 2015-04-15 DIAGNOSIS — I10 Essential (primary) hypertension: Secondary | ICD-10-CM | POA: Insufficient documentation

## 2015-04-15 DIAGNOSIS — F419 Anxiety disorder, unspecified: Secondary | ICD-10-CM

## 2015-04-15 DIAGNOSIS — R945 Abnormal results of liver function studies: Secondary | ICD-10-CM | POA: Insufficient documentation

## 2015-04-15 DIAGNOSIS — L508 Other urticaria: Secondary | ICD-10-CM | POA: Insufficient documentation

## 2015-04-15 MED ORDER — ALPRAZOLAM 2 MG PO TABS: 2.0000 mg | ORAL_TABLET | Freq: Two times a day (BID) | ORAL | Status: DC | PRN

## 2015-04-15 MED ORDER — OXYCODONE HCL 5 MG PO TABS: 5.0000 mg | ORAL_TABLET | Freq: Three times a day (TID) | ORAL | Status: DC | PRN

## 2015-04-15 NOTE — Progress Notes (Signed)
Name: Leah Shah   MRN: [id number]    DOB: 1955-07-11   Date:04/15/2015       Progress Note  Subjective  Chief Complaint  Chief Complaint  Patient presents with  . Follow-up    1 mo  . Hyperlipidemia  . Medication Refill    oxycodone 5 mg / alprazolam 2 mg   Anxiety Presents for follow-up visit. Symptoms include excessive worry, insomnia, nervous/anxious behavior and panic. Patient reports no shortness of breath. Symptoms occur most days.   Past treatments include benzodiazephines. The treatment provided significant relief. Compliance with prior treatments has been good.  Back Pain This is a chronic problem. The pain is present in the lumbar spine. The quality of the pain is described as aching. The pain is at a severity of 8/10. The pain is worse during the night. The symptoms are aggravated by position and standing. Pertinent negatives include no bladder incontinence, bowel incontinence or leg pain. Risk factors include obesity. She has tried analgesics for the symptoms. The treatment provided moderate relief.   Past Medical History  Diagnosis Date  . Heart valve problem     was on Redux and it caused some "heart valve damage"-does not see cardiologist  . [id number](921.1)   . Anxiety   . Recurrent upper respiratory infection (URI)     treated /w OTC med.   . Arthritis     R knee, L knee - OA  . Hypertension     echoJefm Bryant, wnl 2009? , had taken Redux for diet management  but now  off the market. pt, told that echo wnl.    . Dysrhythmia     "mild arrythmia after taking Redux diet med years ago"no cardio fi  . Shortness of breath     with exertion  . Hyperlipidemia    Past Surgical History  Procedure Laterality Date  . Abdominal hysterectomy      partial-uterus removed  . Tmj arthroplasty      [facility]- 1997  . Heel spur surgery    . Knee arthroscopy      right knee  . Coccyx removal      2004[facility]  . Eye surgery      states both lens replaced. Not sure if  cataract surgery or not.  . Cholecystectomy      2011  . Knee arthroplasty  07/28/2011    Procedure: COMPUTER ASSISTED TOTAL KNEE ARTHROPLASTY;  Surgeon: [name], MD;  Location: [place];  Service: Orthopedics;  Laterality: Right;  right total knee arthroplasty, computer assisted  . I&d extremity  08/27/2011    Procedure: IRRIGATION AND DEBRIDEMENT EXTREMITY;  Surgeon: [name], MD;  Location: [place];  Service: Orthopedics;  Laterality: Right;   Family History  Problem Relation Age of Onset  . Heart disease Mother   . Hypertension Mother   . Hypertension Father     Social History   Social History  . Marital Status: Married    Spouse Name: N/A  . Number of Children: N/A  . Years of Education: N/A   Occupational History  . Not on file.   Social History Main Topics  . Smoking status: Current Every Day Smoker -- 1.00 packs/day for 50 years  . Smokeless tobacco: Never Used  . Alcohol Use: No  . Drug Use: No  . Sexual Activity: Not Currently   Other Topics Concern  . Not on file   Social History Narrative    Current outpatient prescriptions:  .  alprazolam (XANAX) 2 MG tablet, Take 1 tablet (2 mg total) by mouth 2 (two) times daily as needed for anxiety., Disp: 60 tablet, Rfl: 0 .  atenolol (TENORMIN) 100 MG tablet, Take 1 tablet (100 mg total) by mouth daily., Disp: 30 tablet, Rfl: 0 .  atenolol (TENORMIN) 100 MG tablet, Take 1 tablet (100 mg total) by mouth daily., Disp: 30 tablet, Rfl: 0 .  atenolol (TENORMIN) 100 MG tablet, Take 1 tablet (100 mg total) by mouth daily., Disp: 30 tablet, Rfl: 2 .  cyclobenzaprine (FLEXERIL) 5 MG tablet, Take 1.5 tablets (7.5 mg total) by mouth at bedtime., Disp: 45 tablet, Rfl: 2 .  hydrochlorothiazide (HYDRODIURIL) 25 MG tablet, Take 1 tablet by mouth daily., Disp: , Rfl:  .  hydrOXYzine (ATARAX/VISTARIL) 25 MG tablet, Take 1 tablet by mouth daily as needed., Disp: , Rfl:  .  losartan (COZAAR) 50 MG tablet, Take 1 tablet (50  mg total) by mouth daily., Disp: 30 tablet, Rfl: 2 .  oxyCODONE (OXY IR/ROXICODONE) 5 MG immediate release tablet, Take 1 tablet (5 mg total) by mouth every 8 (eight) hours as needed for moderate pain., Disp: 90 tablet, Rfl: 0 .  simvastatin (ZOCOR) 40 MG tablet, Take 1 tablet (40 mg total) by mouth at bedtime., Disp: 90 tablet, Rfl: 0 .  traZODone (DESYREL) 150 MG tablet, Take 1 tablet by mouth at bedtime., Disp: , Rfl:  .  methocarbamol (ROBAXIN) 500 MG tablet, Take 500 mg by mouth every 8 (eight) hours as needed. For muscle spasms, Disp: , Rfl:  .  Multiple Vitamin (MULTIVITAMIN PO), Take 1 tablet by mouth daily. , Disp: , Rfl:   Allergies  Allergen Reactions  . Bacitracin-Neomycin-Polymyxin Other (See Comments)  . Codeine Other (See Comments)    Itching "she can take some forms of it" Itching "she can take some forms of it"  . Gabapentin Nausea Only  . Neosporin  [Neomycin-Bacitracin Zn-Polymyx]   . Pregabalin Other (See Comments)    stomach issues stomach issues   Review of Systems  Respiratory: Negative for shortness of breath.   Gastrointestinal: Negative for bowel incontinence.  Genitourinary: Negative for bladder incontinence.  Musculoskeletal: Positive for back pain and joint pain.  Psychiatric/Behavioral: Positive for depression. The patient is nervous/anxious and has insomnia.    Objective  Filed Vitals:   04/15/15 1522  BP: 118/80  Pulse: 78  Temp: 98 F (36.7 C)  TempSrc: Oral  Resp: 18  Height: 5\' 6"  (1.676 m)  Weight: 314 lb 3.2 oz (142.52 kg)  SpO2: 92%   Physical Exam  Constitutional: She is well-developed, well-nourished, and in no distress.  Cardiovascular: Normal rate and regular rhythm.   Pulmonary/Chest: Effort normal and breath sounds normal.  Musculoskeletal:       Lumbar back: She exhibits tenderness, pain and spasm.  Nursing note and vitals reviewed.  Assessment & Plan  1. Anxiety Symptoms of anxiety are stable and controlled on daily  benzodiazepine therapy.refills provided. Patient aware of the dependence potential for benzodiazepines. Follow-up in one month. - alprazolam (XANAX) 2 MG tablet; Take 1 tablet (2 mg total) by mouth 2 (two) times daily as needed for anxiety.  Dispense: 60 tablet; Refill: 0  2. Chronic LBP Chronic low back pain responsive to daily opioid therapy. Patient aware of the dependence potential, side effects, and drug interactions of opioids. Refills provided and follow-up in one month. - oxyCODONE (OXY IR/ROXICODONE) 5 MG immediate release tablet; Take 1 tablet (5 mg total) by mouth every 8 (  eight) hours as needed for moderate pain.  Dispense: 90 tablet; Refill: 0   Quasim Doyon Asad A[facility] 04/15/2015 3:33 PM

## 2015-04-22 ENCOUNTER — Telehealth: Payer: Self-pay | Admitting: Family Medicine

## 2015-04-22 DIAGNOSIS — M25561 Pain in right knee: Secondary | ICD-10-CM

## 2015-04-22 DIAGNOSIS — M79606 Pain in leg, unspecified: Secondary | ICD-10-CM | POA: Insufficient documentation

## 2015-04-22 NOTE — Telephone Encounter (Signed)
Routed to Dr. Shah for referral 

## 2015-04-22 NOTE — Telephone Encounter (Signed)
Patient is still experiencing back and right knee pain and is wanting a referral to [facility] ortho. As soon as possible.

## 2015-04-23 NOTE — Telephone Encounter (Signed)
Orthopedic referral has been ordered

## 2015-04-25 DIAGNOSIS — R69 Illness, unspecified: Secondary | ICD-10-CM | POA: Diagnosis not present

## 2015-05-01 ENCOUNTER — Telehealth: Payer: Self-pay | Admitting: Family Medicine

## 2015-05-01 ENCOUNTER — Other Ambulatory Visit: Payer: Self-pay | Admitting: Family Medicine

## 2015-05-01 NOTE — Telephone Encounter (Signed)
Patient is hoping by change that you would prescribe another round of amoxicillin 875-125 mg. It was prescribed before to help with the congestion in her chest and cough and it has not cleared completely. Please send to [place]

## 2015-05-02 NOTE — Telephone Encounter (Signed)
Routed to Dr. Shah for advice  

## 2015-05-02 NOTE — Telephone Encounter (Signed)
Patient will need an office visit appointment to evaluate her symptoms and prescribe appropriate therapy.

## 2015-05-08 NOTE — Telephone Encounter (Signed)
Patient will need an office visit appointment to evaluate her symptoms and prescribe appropriate therapy, patient has been notified.

## 2015-05-16 ENCOUNTER — Encounter: Payer: Self-pay | Admitting: Family Medicine

## 2015-05-16 ENCOUNTER — Ambulatory Visit (INDEPENDENT_AMBULATORY_CARE_PROVIDER_SITE_OTHER): Payer: Commercial Managed Care - HMO | Admitting: Family Medicine

## 2015-05-16 VITALS — BP 130/78 | HR 83 | Temp 97.9°F | Resp 14 | Ht 66.0 in | Wt 316.3 lb

## 2015-05-16 DIAGNOSIS — M545 Low back pain, unspecified: Secondary | ICD-10-CM

## 2015-05-16 DIAGNOSIS — F419 Anxiety disorder, unspecified: Secondary | ICD-10-CM | POA: Diagnosis not present

## 2015-05-16 DIAGNOSIS — G8929 Other chronic pain: Secondary | ICD-10-CM

## 2015-05-16 DIAGNOSIS — Z23 Encounter for immunization: Secondary | ICD-10-CM | POA: Diagnosis not present

## 2015-05-16 DIAGNOSIS — M25551 Pain in right hip: Secondary | ICD-10-CM | POA: Diagnosis not present

## 2015-05-16 MED ORDER — ALPRAZOLAM 2 MG PO TABS: 2.0000 mg | ORAL_TABLET | Freq: Two times a day (BID) | ORAL | Status: DC | PRN

## 2015-05-16 MED ORDER — OXYCODONE HCL 5 MG PO TABS: 5.0000 mg | ORAL_TABLET | Freq: Three times a day (TID) | ORAL | Status: DC | PRN

## 2015-05-16 NOTE — Progress Notes (Signed)
Name: Leah Shah   MRN: [id number]    DOB: Dec 14, 1955   Date:05/16/2015       Progress Note  Subjective  Chief Complaint  Chief Complaint  Patient presents with  . Medication Refill    patient needs refill of oxycodone and xanax.    Anxiety Presents for follow-up visit. The problem has been unchanged. Symptoms include excessive worry, insomnia, nervous/anxious behavior and panic. Patient reports no shortness of breath. Symptoms occur most days.   Her past medical history is significant for anxiety/panic attacks. Past treatments include benzodiazephines. The treatment provided significant relief. Compliance with prior treatments has been good.  Back Pain This is a chronic problem. The problem is unchanged. The pain is present in the lumbar spine and sacro-iliac. The quality of the pain is described as aching. The pain is at a severity of 9/10 (lumbo-sacral pain is worse recently after her right hip and right thigh also started hurting). The pain is severe. The pain is worse during the night. The symptoms are aggravated by position, standing and sitting. Pertinent negatives include no bladder incontinence, bowel incontinence, leg pain, numbness, paresis or tingling. Risk factors include obesity. She has tried analgesics for the symptoms. The treatment provided moderate relief.    Past Medical History  Diagnosis Date  . Heart valve problem     was on Redux and it caused some "heart valve damage"-does not see cardiologist  . [id number])   . Anxiety   . Recurrent upper respiratory infection (URI)     treated /w OTC med.   . Arthritis     R knee, L knee - OA  . Hypertension     echoJefm Bryant, wnl 2009? , had taken Redux for diet management  but now  off the market. pt, told that echo wnl.    . Dysrhythmia     "mild arrythmia after taking Redux diet med years ago"no cardio fi  . Shortness of breath     with exertion  . Hyperlipidemia     Past Surgical History  Procedure  Laterality Date  . Abdominal hysterectomy      partial-uterus removed  . Tmj arthroplasty      [facility]- 1997  . Heel spur surgery    . Knee arthroscopy      right knee  . Coccyx removal      [facility]  . Eye surgery      states both lens replaced. Not sure if cataract surgery or not.  . Cholecystectomy      2011  . Knee arthroplasty  07/28/2011    Procedure: COMPUTER ASSISTED TOTAL KNEE ARTHROPLASTY;  Surgeon: [name], MD;  Location: [place];  Service: Orthopedics;  Laterality: Right;  right total knee arthroplasty, computer assisted  . I&d extremity  08/27/2011    Procedure: IRRIGATION AND DEBRIDEMENT EXTREMITY;  Surgeon: [name], MD;  Location: [place];  Service: Orthopedics;  Laterality: Right;    Family History  Problem Relation Age of Onset  . Heart disease Mother   . Hypertension Mother   . Hypertension Father     Social History   Social History  . Marital Status: Married    Spouse Name: N/A  . Number of Children: N/A  . Years of Education: N/A   Occupational History  . Not on file.   Social History Main Topics  . Smoking status: Current Every Day Smoker -- 1.00 packs/day for 50 years  . Smokeless tobacco: Never Used  .  Alcohol Use: No  . Drug Use: No  . Sexual Activity: Not Currently   Other Topics Concern  . Not on file   Social History Narrative     Current outpatient prescriptions:  .  alprazolam (XANAX) 2 MG tablet, Take 1 tablet (2 mg total) by mouth 2 (two) times daily as needed for anxiety., Disp: 60 tablet, Rfl: 0 .  atenolol (TENORMIN) 100 MG tablet, Take 1 tablet (100 mg total) by mouth daily., Disp: 30 tablet, Rfl: 0 .  atenolol (TENORMIN) 100 MG tablet, Take 1 tablet (100 mg total) by mouth daily., Disp: 30 tablet, Rfl: 0 .  atenolol (TENORMIN) 100 MG tablet, Take 1 tablet (100 mg total) by mouth daily., Disp: 30 tablet, Rfl: 2 .  cyclobenzaprine (FLEXERIL) 5 MG tablet, TAKE ONE AND ONE-HALF TABLETS BY MOUTH EVERY NIGHT AT  BEDTIME, Disp: 45 tablet, Rfl: 2 .  hydrochlorothiazide (HYDRODIURIL) 25 MG tablet, Take 1 tablet by mouth daily., Disp: , Rfl:  .  hydrOXYzine (ATARAX/VISTARIL) 25 MG tablet, Take 1 tablet by mouth daily as needed., Disp: , Rfl:  .  losartan (COZAAR) 50 MG tablet, Take 1 tablet (50 mg total) by mouth daily., Disp: 30 tablet, Rfl: 2 .  Multiple Vitamin (MULTIVITAMIN PO), Take 1 tablet by mouth daily. , Disp: , Rfl:  .  oxyCODONE (OXY IR/ROXICODONE) 5 MG immediate release tablet, Take 1 tablet (5 mg total) by mouth every 8 (eight) hours as needed for moderate pain., Disp: 90 tablet, Rfl: 0 .  simvastatin (ZOCOR) 40 MG tablet, Take 1 tablet (40 mg total) by mouth at bedtime., Disp: 90 tablet, Rfl: 0 .  traZODone (DESYREL) 150 MG tablet, Take 1 tablet by mouth at bedtime., Disp: , Rfl:   Allergies  Allergen Reactions  . Bacitracin-Neomycin-Polymyxin Other (See Comments)  . Codeine Other (See Comments)    Itching "she can take some forms of it" Itching "she can take some forms of it"  . Gabapentin Nausea Only  . Neosporin  [Neomycin-Bacitracin Zn-Polymyx]   . Pregabalin Other (See Comments)    stomach issues stomach issues     Review of Systems  Respiratory: Negative for shortness of breath.   Gastrointestinal: Negative for bowel incontinence.  Genitourinary: Negative for bladder incontinence.  Musculoskeletal: Positive for back pain.  Neurological: Negative for tingling and numbness.  Psychiatric/Behavioral: The patient is nervous/anxious and has insomnia.     Objective  Filed Vitals:   05/16/15 1459  BP: 130/78  Pulse: 83  Temp: 97.9 F (36.6 C)  TempSrc: Oral  Resp: 14  Height: 5\' 6"  (1.676 m)  Weight: 316 lb 4.8 oz (143.473 kg)  SpO2: 91%    Physical Exam  Constitutional: She is oriented to person, place, and time and well-developed, well-nourished, and in no distress.  Cardiovascular: Normal rate, regular rhythm and normal heart sounds.   Pulmonary/Chest: Effort  normal and breath sounds normal.  Musculoskeletal:       Lumbar back: She exhibits tenderness and pain.       Back:       Legs: Right lateral hip pain, radiating down to right thigh  Neurological: She is alert and oriented to person, place, and time.  Psychiatric: Memory, affect and judgment normal.  Nursing note and vitals reviewed.   Assessment & Plan  1. Chronic LBP Stable and controlled on chronic opioid therapy. Patient compliant with controlled substances agreement. - oxyCODONE (OXY IR/ROXICODONE) 5 MG immediate release tablet; Take 1 tablet (5 mg total) by mouth every  8 (eight) hours as needed for moderate pain.  Dispense: 90 tablet; Refill: 0  2. Anxiety Symptoms stable on Xanax 2 mg twice daily as needed. Patient aware of the dependence potential, side effects, and drug interactions of benzodiazepines. Refills provided and follow-up in one month - alprazolam (XANAX) 2 MG tablet; Take 1 tablet (2 mg total) by mouth 2 (two) times daily as needed for anxiety.  Dispense: 60 tablet; Refill: 0  3. Acute right hip pain Patient has been referred to orthopedics for right knee pain and she is going to follow up for the new onset right hip pain as well. For now, oxycodone is helping with the hip pain as well.    Zerick Prevette Asad A. [facility] 05/16/2015 3:58 PM

## 2015-05-26 DIAGNOSIS — R69 Illness, unspecified: Secondary | ICD-10-CM | POA: Diagnosis not present

## 2015-06-07 DIAGNOSIS — M25561 Pain in right knee: Secondary | ICD-10-CM | POA: Diagnosis not present

## 2015-06-07 DIAGNOSIS — Z96651 Presence of right artificial knee joint: Secondary | ICD-10-CM | POA: Insufficient documentation

## 2015-06-07 DIAGNOSIS — M545 Low back pain: Secondary | ICD-10-CM | POA: Diagnosis not present

## 2015-06-07 DIAGNOSIS — M5136 Other intervertebral disc degeneration, lumbar region: Secondary | ICD-10-CM | POA: Diagnosis not present

## 2015-06-07 DIAGNOSIS — M1712 Unilateral primary osteoarthritis, left knee: Secondary | ICD-10-CM | POA: Insufficient documentation

## 2015-06-09 ENCOUNTER — Other Ambulatory Visit: Payer: Self-pay | Admitting: Family Medicine

## 2015-06-10 ENCOUNTER — Other Ambulatory Visit: Payer: Self-pay | Admitting: Surgery

## 2015-06-10 DIAGNOSIS — M545 Low back pain: Secondary | ICD-10-CM

## 2015-06-10 DIAGNOSIS — M5136 Other intervertebral disc degeneration, lumbar region: Secondary | ICD-10-CM

## 2015-06-12 ENCOUNTER — Telehealth: Payer: Self-pay | Admitting: Family Medicine

## 2015-06-12 MED ORDER — ATENOLOL 100 MG PO TABS: 100.0000 mg | ORAL_TABLET | Freq: Every day | ORAL | Status: DC

## 2015-06-12 NOTE — Telephone Encounter (Signed)
Pt needs refill on Atenolol to be sent to [place].

## 2015-06-12 NOTE — Telephone Encounter (Signed)
Medication has been refilled and sent to [facility]

## 2015-06-13 ENCOUNTER — Ambulatory Visit (INDEPENDENT_AMBULATORY_CARE_PROVIDER_SITE_OTHER): Payer: Commercial Managed Care - HMO | Admitting: Family Medicine

## 2015-06-13 ENCOUNTER — Encounter: Payer: Self-pay | Admitting: Family Medicine

## 2015-06-13 VITALS — BP 136/76 | HR 90 | Temp 98.5°F | Resp 18 | Ht 66.0 in | Wt 318.7 lb

## 2015-06-13 DIAGNOSIS — G8929 Other chronic pain: Secondary | ICD-10-CM | POA: Diagnosis not present

## 2015-06-13 DIAGNOSIS — F419 Anxiety disorder, unspecified: Secondary | ICD-10-CM

## 2015-06-13 DIAGNOSIS — M545 Low back pain: Secondary | ICD-10-CM

## 2015-06-13 MED ORDER — ALPRAZOLAM 2 MG PO TABS: 2.0000 mg | ORAL_TABLET | Freq: Two times a day (BID) | ORAL | Status: DC | PRN

## 2015-06-13 MED ORDER — OXYCODONE HCL 5 MG PO TABS: 5.0000 mg | ORAL_TABLET | Freq: Three times a day (TID) | ORAL | Status: DC | PRN

## 2015-06-13 NOTE — Progress Notes (Signed)
Name: Leah Shah   MRN: [id number]    DOB: 01-09-1956   Date:06/13/2015       Progress Note  Subjective  Chief Complaint  Chief Complaint  Patient presents with  . Follow-up    1 mo  . Medication Refill    alprazolam 2mg  / oxycodone 5mg     Anxiety Presents for follow-up visit. The problem has been unchanged. Symptoms include excessive worry, insomnia, nervous/anxious behavior and panic. Patient reports no shortness of breath. Symptoms occur most days. The severity of symptoms is moderate.   Her past medical history is significant for anxiety/panic attacks. Past treatments include benzodiazephines. The treatment provided significant relief. Compliance with prior treatments has been good.  Back Pain This is a chronic problem. The problem is unchanged. The pain is present in the lumbar spine and sacro-iliac. The quality of the pain is described as aching. The pain is at a severity of 8/10 (lumbo-sacral pain is worse recently after her right hip and right thigh also started hurting). The pain is moderate. The pain is worse during the night. The symptoms are aggravated by position, standing and sitting. Pertinent negatives include no bladder incontinence, bowel incontinence, leg pain, numbness, paresis or tingling. Risk factors include obesity. She has tried analgesics for the symptoms. The treatment provided moderate relief.    Past Medical History  Diagnosis Date  . Heart valve problem     was on Redux and it caused some "heart valve damage"-does not see cardiologist  . [id number])   . Anxiety   . Recurrent upper respiratory infection (URI)     treated /w OTC med.   . Arthritis     R knee, L knee - OA  . Hypertension     echoJefm Bryant, wnl 2009? , had taken Redux for diet management  but now  off the market. pt, told that echo wnl.    . Dysrhythmia     "mild arrythmia after taking Redux diet med years ago"no cardio fi  . Shortness of breath     with exertion  . Hyperlipidemia      Past Surgical History  Procedure Laterality Date  . Abdominal hysterectomy      partial-uterus removed  . Tmj arthroplasty      [facility]- 1997  . Heel spur surgery    . Knee arthroscopy      right knee  . Coccyx removal      2004[facility]  . Eye surgery      states both lens replaced. Not sure if cataract surgery or not.  . Cholecystectomy      2011  . Knee arthroplasty  07/28/2011    Procedure: COMPUTER ASSISTED TOTAL KNEE ARTHROPLASTY;  Surgeon: [name], MD;  Location: [place];  Service: Orthopedics;  Laterality: Right;  right total knee arthroplasty, computer assisted  . I&d extremity  08/27/2011    Procedure: IRRIGATION AND DEBRIDEMENT EXTREMITY;  Surgeon: [name], MD;  Location: [place];  Service: Orthopedics;  Laterality: Right;    Family History  Problem Relation Age of Onset  . Heart disease Mother   . Hypertension Mother   . Hypertension Father     Social History   Social History  . Marital Status: Married    Spouse Name: N/A  . Number of Children: N/A  . Years of Education: N/A   Occupational History  . Not on file.   Social History Main Topics  . Smoking status: Current Every Day Smoker --  1.00 packs/day for 50 years  . Smokeless tobacco: Never Used  . Alcohol Use: No  . Drug Use: No  . Sexual Activity: Not Currently   Other Topics Concern  . Not on file   Social History Narrative     Current outpatient prescriptions:  .  alprazolam (XANAX) 2 MG tablet, Take 1 tablet (2 mg total) by mouth 2 (two) times daily as needed for anxiety., Disp: 60 tablet, Rfl: 0 .  atenolol (TENORMIN) 100 MG tablet, Take 1 tablet (100 mg total) by mouth daily., Disp: 30 tablet, Rfl: 0 .  atenolol (TENORMIN) 100 MG tablet, Take 1 tablet (100 mg total) by mouth daily., Disp: 30 tablet, Rfl: 2 .  atenolol (TENORMIN) 100 MG tablet, Take 1 tablet (100 mg total) by mouth daily., Disp: 30 tablet, Rfl: 0 .  cyclobenzaprine (FLEXERIL) 5 MG tablet, TAKE ONE AND  ONE-HALF TABLETS BY MOUTH EVERY NIGHT AT BEDTIME, Disp: 45 tablet, Rfl: 2 .  hydrochlorothiazide (HYDRODIURIL) 25 MG tablet, Take 1 tablet by mouth daily., Disp: , Rfl:  .  hydrOXYzine (ATARAX/VISTARIL) 25 MG tablet, Take 1 tablet by mouth daily as needed., Disp: , Rfl:  .  losartan (COZAAR) 50 MG tablet, Take 1 tablet (50 mg total) by mouth daily., Disp: 30 tablet, Rfl: 2 .  Multiple Vitamin (MULTIVITAMIN PO), Take 1 tablet by mouth daily. , Disp: , Rfl:  .  oxyCODONE (OXY IR/ROXICODONE) 5 MG immediate release tablet, Take 1 tablet (5 mg total) by mouth every 8 (eight) hours as needed for moderate pain., Disp: 90 tablet, Rfl: 0 .  simvastatin (ZOCOR) 40 MG tablet, TAKE 1 TABLET (40 MG TOTAL) BY MOUTH AT BEDTIME., Disp: 90 tablet, Rfl: 0 .  traZODone (DESYREL) 150 MG tablet, Take 1 tablet by mouth at bedtime., Disp: , Rfl:   Allergies  Allergen Reactions  . Bacitracin-Neomycin-Polymyxin Other (See Comments)  . Codeine Other (See Comments)    Itching "she can take some forms of it" Itching "she can take some forms of it"  . Gabapentin Nausea Only  . Neosporin  [Neomycin-Bacitracin Zn-Polymyx]   . Pregabalin Other (See Comments)    stomach issues stomach issues    Review of Systems  Respiratory: Negative for shortness of breath.   Gastrointestinal: Negative for bowel incontinence.  Genitourinary: Negative for bladder incontinence.  Musculoskeletal: Positive for back pain and joint pain.  Neurological: Negative for tingling and numbness.  Psychiatric/Behavioral: Positive for depression. The patient is nervous/anxious and has insomnia.     Objective  Filed Vitals:   06/13/15 1506  BP: 136/76  Pulse: 90  Temp: 98.5 F (36.9 C)  TempSrc: Oral  Resp: 18  Height: 5\' 6"  (1.676 m)  Weight: 318 lb 11.2 oz (144.561 kg)  SpO2: 93%    Physical Exam  Constitutional: She is oriented to person, place, and time and well-developed, well-nourished, and in no distress.  Cardiovascular:  Normal rate, regular rhythm and normal heart sounds.   Pulmonary/Chest: Effort normal and breath sounds normal.  Musculoskeletal:       Lumbar back: She exhibits tenderness and pain.       Back:  Right lateral hip pain, radiating down to right thigh  Neurological: She is alert and oriented to person, place, and time.  Psychiatric: Memory, affect and judgment normal.  Nursing note and vitals reviewed.  Assessment & Plan  1. Anxiety Stable on current therapy. - alprazolam (XANAX) 2 MG tablet; Take 1 tablet (2 mg total) by mouth 2 (two) times  daily as needed for anxiety.  Dispense: 60 tablet; Refill: 0  2. Chronic LBP Chronic LBP, stable on daily opioid therapy. Patient compliant with controlled substances agreement. Refills provided follow-up in one month. - oxyCODONE (OXY IR/ROXICODONE) 5 MG immediate release tablet; Take 1 tablet (5 mg total) by mouth every 8 (eight) hours as needed for moderate pain.  Dispense: 90 tablet; Refill: 0   Laiken Sandy [facility] 06/13/2015 3:31 PM

## 2015-06-16 DIAGNOSIS — E785 Hyperlipidemia, unspecified: Secondary | ICD-10-CM | POA: Insufficient documentation

## 2015-06-16 DIAGNOSIS — I340 Nonrheumatic mitral (valve) insufficiency: Secondary | ICD-10-CM | POA: Insufficient documentation

## 2015-06-25 DIAGNOSIS — R69 Illness, unspecified: Secondary | ICD-10-CM | POA: Diagnosis not present

## 2015-07-03 ENCOUNTER — Ambulatory Visit: Payer: Medicare HMO

## 2015-07-12 ENCOUNTER — Telehealth: Payer: Self-pay | Admitting: Family Medicine

## 2015-07-12 MED ORDER — ATENOLOL 100 MG PO TABS: 100.0000 mg | ORAL_TABLET | Freq: Every day | ORAL | Status: DC

## 2015-07-12 NOTE — Telephone Encounter (Signed)
Patient is scheduled for 07/15/15 but due not knowing if our office will be open she would like a refill on Atenolol 100 mg 1 tablet daily [facility] [address] [facility] [place].  Patient stated that she was completely out.

## 2015-07-12 NOTE — Telephone Encounter (Signed)
Atenolol 100 mg has been refilled and sent to [facility]

## 2015-07-15 ENCOUNTER — Ambulatory Visit: Payer: Commercial Managed Care - HMO | Admitting: Family Medicine

## 2015-07-16 ENCOUNTER — Other Ambulatory Visit: Payer: Self-pay

## 2015-07-16 ENCOUNTER — Ambulatory Visit (INDEPENDENT_AMBULATORY_CARE_PROVIDER_SITE_OTHER): Payer: Commercial Managed Care - HMO | Admitting: Family Medicine

## 2015-07-16 DIAGNOSIS — M545 Low back pain, unspecified: Secondary | ICD-10-CM

## 2015-07-16 DIAGNOSIS — G8929 Other chronic pain: Secondary | ICD-10-CM

## 2015-07-16 DIAGNOSIS — F419 Anxiety disorder, unspecified: Secondary | ICD-10-CM

## 2015-07-16 MED ORDER — OXYCODONE HCL 5 MG PO TABS: 5.0000 mg | ORAL_TABLET | Freq: Three times a day (TID) | ORAL | Status: DC | PRN

## 2015-07-16 MED ORDER — ALPRAZOLAM 2 MG PO TABS: 2.0000 mg | ORAL_TABLET | Freq: Two times a day (BID) | ORAL | Status: DC | PRN

## 2015-07-16 MED ORDER — CYCLOBENZAPRINE HCL 5 MG PO TABS: ORAL_TABLET | ORAL | Status: DC

## 2015-07-16 NOTE — Progress Notes (Signed)
This encounter was created in error - please disregard.

## 2015-07-16 NOTE — Telephone Encounter (Signed)
Medication has been refilled and sent to [facility] per Dr. Manuella Ghazi

## 2015-07-23 ENCOUNTER — Ambulatory Visit
Admission: RE | Admit: 2015-07-23 | Discharge: 2015-07-23 | Disposition: A | Discharge status: Home / Self Care | Payer: Commercial Managed Care - HMO | Source: Ambulatory Visit | Attending: Surgery | Admitting: Surgery

## 2015-07-23 DIAGNOSIS — M545 Low back pain: Secondary | ICD-10-CM | POA: Diagnosis not present

## 2015-07-23 DIAGNOSIS — M4806 Spinal stenosis, lumbar region: Secondary | ICD-10-CM | POA: Diagnosis not present

## 2015-07-23 DIAGNOSIS — M5126 Other intervertebral disc displacement, lumbar region: Secondary | ICD-10-CM | POA: Diagnosis not present

## 2015-07-23 DIAGNOSIS — M5136 Other intervertebral disc degeneration, lumbar region: Secondary | ICD-10-CM | POA: Diagnosis not present

## 2015-07-25 ENCOUNTER — Other Ambulatory Visit: Payer: Self-pay | Admitting: Family Medicine

## 2015-07-26 DIAGNOSIS — R69 Illness, unspecified: Secondary | ICD-10-CM | POA: Diagnosis not present

## 2015-08-05 DIAGNOSIS — M5136 Other intervertebral disc degeneration, lumbar region: Secondary | ICD-10-CM | POA: Diagnosis not present

## 2015-08-15 ENCOUNTER — Encounter: Payer: Self-pay | Admitting: Family Medicine

## 2015-08-15 ENCOUNTER — Ambulatory Visit (INDEPENDENT_AMBULATORY_CARE_PROVIDER_SITE_OTHER): Payer: Commercial Managed Care - HMO | Admitting: Family Medicine

## 2015-08-15 VITALS — BP 124/86 | HR 106 | Temp 97.6°F | Resp 16 | Ht 66.0 in | Wt 323.4 lb

## 2015-08-15 DIAGNOSIS — I10 Essential (primary) hypertension: Secondary | ICD-10-CM

## 2015-08-15 DIAGNOSIS — G8929 Other chronic pain: Secondary | ICD-10-CM | POA: Diagnosis not present

## 2015-08-15 DIAGNOSIS — M545 Low back pain: Secondary | ICD-10-CM | POA: Diagnosis not present

## 2015-08-15 DIAGNOSIS — F419 Anxiety disorder, unspecified: Secondary | ICD-10-CM | POA: Diagnosis not present

## 2015-08-15 DIAGNOSIS — E785 Hyperlipidemia, unspecified: Secondary | ICD-10-CM | POA: Diagnosis not present

## 2015-08-15 MED ORDER — ATENOLOL 100 MG PO TABS: 100.0000 mg | ORAL_TABLET | Freq: Every day | ORAL | Status: DC

## 2015-08-15 MED ORDER — LOSARTAN POTASSIUM 50 MG PO TABS: 50.0000 mg | ORAL_TABLET | Freq: Every day | ORAL | Status: DC

## 2015-08-15 MED ORDER — OXYCODONE HCL 5 MG PO TABS: 5.0000 mg | ORAL_TABLET | Freq: Three times a day (TID) | ORAL | Status: DC | PRN

## 2015-08-15 MED ORDER — SIMVASTATIN 40 MG PO TABS: 40.0000 mg | ORAL_TABLET | Freq: Every day | ORAL | Status: DC

## 2015-08-15 MED ORDER — ALPRAZOLAM 2 MG PO TABS: 2.0000 mg | ORAL_TABLET | Freq: Two times a day (BID) | ORAL | Status: DC | PRN

## 2015-08-15 NOTE — Progress Notes (Signed)
Name: Leah Shah   MRN: [id number]    DOB: 1956/01/23   Date:08/15/2015       Progress Note  Subjective  Chief Complaint  Chief Complaint  Patient presents with  . Medication Refill  . Hypertension    has been ot of atenolol  . Hyperlipidemia    stopped atorvastin due to muscle pain, which has helped  . Insomnia  . Pain    low back and bilateral knees    Hypertension This is a chronic problem. The problem is controlled. Associated symptoms include anxiety. Pertinent negatives include no blurred vision, chest pain, headaches, palpitations or shortness of breath. Past treatments include beta blockers and angiotensin blockers. There is no history of kidney disease, CAD/MI or CVA.  Hyperlipidemia This is a chronic problem. The problem is controlled. Pertinent negatives include no chest pain, leg pain, myalgias or shortness of breath. Current antihyperlipidemic treatment includes statins.  Anxiety Presents for follow-up visit. The problem has been unchanged. Symptoms include excessive worry, insomnia, nervous/anxious behavior and panic. Patient reports no chest pain, palpitations or shortness of breath. Symptoms occur most days. The severity of symptoms is moderate.   Her past medical history is significant for anxiety/panic attacks. Past treatments include benzodiazephines. The treatment provided significant relief. Compliance with prior treatments has been good.  Back Pain This is a chronic problem. The problem is unchanged. The pain is present in the lumbar spine and sacro-iliac. The quality of the pain is described as aching. The pain is at a severity of 6/10 (lumbo-sacral pain is worse recently after her right hip and right thigh also started hurting). The pain is moderate. The pain is worse during the night. The symptoms are aggravated by position, standing and sitting. Pertinent negatives include no bladder incontinence, bowel incontinence, chest pain, headaches, leg pain, numbness,  paresis or tingling. Risk factors include obesity. She has tried analgesics for the symptoms. The treatment provided moderate relief.     Past Medical History  Diagnosis Date  . Heart valve problem     was on Redux and it caused some "heart valve damage"-does not see cardiologist  . [id number])   . Anxiety   . Recurrent upper respiratory infection (URI)     treated /w OTC med.   . Arthritis     R knee, L knee - OA  . Hypertension     echoJefm Bryant, wnl 2009? , had taken Redux for diet management  but now  off the market. pt, told that echo wnl.    . Dysrhythmia     "mild arrythmia after taking Redux diet med years ago"no cardio fi  . Shortness of breath     with exertion  . Hyperlipidemia     Past Surgical History  Procedure Laterality Date  . Abdominal hysterectomy      partial-uterus removed  . Tmj arthroplasty      [facility]- 1997  . Heel spur surgery    . Knee arthroscopy      right knee  . Coccyx removal      [facility]  . Eye surgery      states both lens replaced. Not sure if cataract surgery or not.  . Cholecystectomy      2011  . Knee arthroplasty  07/28/2011    Procedure: COMPUTER ASSISTED TOTAL KNEE ARTHROPLASTY;  Surgeon: [name], MD;  Location: [place];  Service: Orthopedics;  Laterality: Right;  right total knee arthroplasty, computer assisted  . I&d extremity  08/27/2011    Procedure: IRRIGATION AND DEBRIDEMENT EXTREMITY;  Surgeon: [name], MD;  Location: [place];  Service: Orthopedics;  Laterality: Right;    Family History  Problem Relation Age of Onset  . Heart disease Mother   . Hypertension Mother   . Hypertension Father     Social History   Social History  . Marital Status: Married    Spouse Name: N/A  . Number of Children: N/A  . Years of Education: N/A   Occupational History  . Not on file.   Social History Main Topics  . Smoking status: Current Every Day Smoker -- 1.00 packs/day for 50 years  . Smokeless tobacco:  Never Used  . Alcohol Use: No  . Drug Use: No  . Sexual Activity: Not Currently   Other Topics Concern  . Not on file   Social History Narrative     Current outpatient prescriptions:  .  alprazolam (XANAX) 2 MG tablet, Take 1 tablet (2 mg total) by mouth 2 (two) times daily as needed for anxiety., Disp: 60 tablet, Rfl: 0 .  atenolol (TENORMIN) 100 MG tablet, Take 1 tablet (100 mg total) by mouth daily., Disp: 30 tablet, Rfl: 0 .  cyclobenzaprine (FLEXERIL) 5 MG tablet, TAKE ONE AND ONE-HALF TABLETS BY MOUTH EVERY NIGHT AT BEDTIME, Disp: 45 tablet, Rfl: 2 .  hydrochlorothiazide (HYDRODIURIL) 25 MG tablet, Take 1 tablet by mouth daily., Disp: , Rfl:  .  hydrOXYzine (ATARAX/VISTARIL) 25 MG tablet, Take 1 tablet by mouth daily as needed., Disp: , Rfl:  .  Multiple Vitamin (MULTIVITAMIN PO), Take 1 tablet by mouth daily. , Disp: , Rfl:  .  oxyCODONE (OXY IR/ROXICODONE) 5 MG immediate release tablet, Take 1 tablet (5 mg total) by mouth every 8 (eight) hours as needed for moderate pain., Disp: 90 tablet, Rfl: 0 .  traZODone (DESYREL) 150 MG tablet, Take 1 tablet by mouth at bedtime., Disp: , Rfl:   Allergies  Allergen Reactions  . Bacitracin-Neomycin-Polymyxin Other (See Comments)  . Codeine Other (See Comments)    Itching "she can take some forms of it" Itching "she can take some forms of it"  . Gabapentin Nausea Only  . Neosporin  [Neomycin-Bacitracin Zn-Polymyx]   . Pregabalin Other (See Comments)    stomach issues stomach issues     Review of Systems  Eyes: Negative for blurred vision.  Respiratory: Negative for shortness of breath.   Cardiovascular: Negative for chest pain and palpitations.  Gastrointestinal: Negative for bowel incontinence.  Genitourinary: Negative for bladder incontinence.  Musculoskeletal: Positive for back pain. Negative for myalgias.  Neurological: Negative for tingling, numbness and headaches.  Psychiatric/Behavioral: The patient is nervous/anxious and  has insomnia.      Objective  Filed Vitals:   08/15/15 1334  BP: 124/86  Pulse: 106  Temp: 97.6 F (36.4 C)  TempSrc: Oral  Resp: 16  Height: 5\' 6"  (1.676 m)  Weight: 323 lb 6.4 oz (146.693 kg)  SpO2: 94%    Physical Exam  Constitutional: She is oriented to person, place, and time and well-developed, well-nourished, and in no distress.  HENT:  Head: Normocephalic and atraumatic.  Cardiovascular: Normal rate and regular rhythm.   Pulmonary/Chest: Effort normal and breath sounds normal.  Abdominal: Soft. Bowel sounds are normal. There is no tenderness.  Musculoskeletal: She exhibits edema.       Lumbar back: She exhibits tenderness, pain and spasm.       Back:  Neurological: She is alert and oriented  to person, place, and time.  Psychiatric: Mood, memory, affect and judgment normal.  Nursing note and vitals reviewed.     Assessment & Plan  1. Benign hypertension BP stable and controlled on therapy. - atenolol (TENORMIN) 100 MG tablet; Take 1 tablet (100 mg total) by mouth daily.  Dispense: 30 tablet; Refill: 0 - losartan (COZAAR) 50 MG tablet; Take 1 tablet (50 mg total) by mouth daily.  Dispense: 30 tablet; Refill: 2  2. Chronic LBP Chronic low back pain, stable on opioid therapy. Patient compliant with controlled substances agreement. Refills provided - oxyCODONE (OXY IR/ROXICODONE) 5 MG immediate release tablet; Take 1 tablet (5 mg total) by mouth every 8 (eight) hours as needed for moderate pain.  Dispense: 90 tablet; Refill: 0  3. Dyslipidemia  - simvastatin (ZOCOR) 40 MG tablet; Take 1 tablet (40 mg total) by mouth daily at 6 PM.  Dispense: 90 tablet; Refill: 0 - Lipid Profile - Comprehensive Metabolic Panel (CMET)  4. Anxiety Skin a responsive to alprazolam taken twice daily as needed. Patient compliant with controlled substances agreement. Understands the dependence potential, side effects, and drug interactions of benzodiazepines. Refills provided. -  alprazolam (XANAX) 2 MG tablet; Take 1 tablet (2 mg total) by mouth 2 (two) times daily as needed for anxiety.  Dispense: 60 tablet; Refill: 0   Santos Sollenberger Asad A[facility] 08/15/2015 1:45 PM

## 2015-08-29 DIAGNOSIS — M5416 Radiculopathy, lumbar region: Secondary | ICD-10-CM | POA: Diagnosis not present

## 2015-08-29 DIAGNOSIS — M5136 Other intervertebral disc degeneration, lumbar region: Secondary | ICD-10-CM | POA: Diagnosis not present

## 2015-09-10 ENCOUNTER — Encounter: Payer: Self-pay | Admitting: Family Medicine

## 2015-09-12 ENCOUNTER — Encounter: Payer: Self-pay | Admitting: Family Medicine

## 2015-09-12 ENCOUNTER — Ambulatory Visit
Admission: RE | Admit: 2015-09-12 | Discharge: 2015-09-12 | Disposition: A | Discharge status: Home / Self Care | Payer: Commercial Managed Care - HMO | Source: Ambulatory Visit | Attending: Family Medicine | Admitting: Family Medicine

## 2015-09-12 ENCOUNTER — Ambulatory Visit (INDEPENDENT_AMBULATORY_CARE_PROVIDER_SITE_OTHER): Payer: Commercial Managed Care - HMO | Admitting: Family Medicine

## 2015-09-12 VITALS — BP 132/78 | HR 93 | Temp 98.0°F | Resp 20 | Ht 66.0 in | Wt 320.4 lb

## 2015-09-12 DIAGNOSIS — M545 Low back pain, unspecified: Secondary | ICD-10-CM

## 2015-09-12 DIAGNOSIS — I10 Essential (primary) hypertension: Secondary | ICD-10-CM | POA: Diagnosis not present

## 2015-09-12 DIAGNOSIS — G8929 Other chronic pain: Secondary | ICD-10-CM

## 2015-09-12 DIAGNOSIS — R05 Cough: Secondary | ICD-10-CM | POA: Diagnosis not present

## 2015-09-12 DIAGNOSIS — F419 Anxiety disorder, unspecified: Secondary | ICD-10-CM | POA: Diagnosis not present

## 2015-09-12 DIAGNOSIS — J209 Acute bronchitis, unspecified: Secondary | ICD-10-CM | POA: Diagnosis not present

## 2015-09-12 MED ORDER — ALPRAZOLAM 2 MG PO TABS: 2.0000 mg | ORAL_TABLET | Freq: Two times a day (BID) | ORAL | Status: DC | PRN

## 2015-09-12 MED ORDER — ALBUTEROL SULFATE HFA 108 (90 BASE) MCG/ACT IN AERS
2.0000 | INHALATION_SPRAY | Freq: Four times a day (QID) | RESPIRATORY_TRACT | Status: DC | PRN
Start: 2015-09-12 — End: 2015-11-13

## 2015-09-12 MED ORDER — ATENOLOL 100 MG PO TABS: 100.0000 mg | ORAL_TABLET | Freq: Every day | ORAL | Status: DC

## 2015-09-12 MED ORDER — BENZONATATE 200 MG PO CAPS: 200.0000 mg | ORAL_CAPSULE | Freq: Three times a day (TID) | ORAL | Status: DC | PRN

## 2015-09-12 MED ORDER — OXYCODONE HCL 5 MG PO TABS: 5.0000 mg | ORAL_TABLET | Freq: Three times a day (TID) | ORAL | Status: DC | PRN

## 2015-09-12 MED ORDER — AZITHROMYCIN 250 MG PO TABS: ORAL_TABLET | ORAL | Status: DC

## 2015-09-12 NOTE — Progress Notes (Signed)
Name: Leah Shah   MRN: [id number]    DOB: [birth year]   Date:09/12/2015       Progress Note  Subjective  Chief Complaint  Chief Complaint  Patient presents with  . Hypertension    pt here for 1 month follow up    Anxiety Presents for follow-up visit. The problem has been unchanged. Symptoms include excessive worry, insomnia, nervous/anxious behavior and panic. Patient reports no chest pain, palpitations or shortness of breath. Symptoms occur most days. The severity of symptoms is moderate.   Her past medical history is significant for anxiety/panic attacks. Past treatments include benzodiazephines. The treatment provided significant relief. Compliance with prior treatments has been good.  Back Pain This is a chronic problem. The problem has been gradually worsening since onset. The pain is present in the lumbar spine and sacro-iliac. The quality of the pain is described as aching. The pain is at a severity of 9/10 (recently gotten worse). The pain is moderate. The pain is worse during the night. The symptoms are aggravated by position, standing and sitting. Pertinent negatives include no bladder incontinence, bowel incontinence, chest pain, headaches, leg pain, numbness, paresis or tingling. Risk factors include obesity. She has tried analgesics for the symptoms. The treatment provided moderate relief.  Hypertension This is a chronic problem. The problem is controlled. Pertinent negatives include no chest pain, headaches, palpitations or shortness of breath. Past treatments include beta blockers. There are no compliance problems.       Past Medical History  Diagnosis Date  . Heart valve problem     was on Redux and it caused some "heart valve damage"-does not see cardiologist  . [id number])   . Anxiety   . Recurrent upper respiratory infection (URI)     treated /w OTC med.   . Arthritis     R knee, L knee - OA  . Hypertension     echoJefm Bryant, wnl 2009? , had taken Redux for  diet management  but now  off the market. pt, told that echo wnl.    . Dysrhythmia     "mild arrythmia after taking Redux diet med years ago"no cardio fi  . Shortness of breath     with exertion  . Hyperlipidemia     Past Surgical History  Procedure Laterality Date  . Abdominal hysterectomy      partial-uterus removed  . Tmj arthroplasty      [facility]- 1997  . Heel spur surgery    . Knee arthroscopy      right knee  . Coccyx removal      [facility]  . Eye surgery      states both lens replaced. Not sure if cataract surgery or not.  . Cholecystectomy      2011  . Knee arthroplasty  07/28/2011    Procedure: COMPUTER ASSISTED TOTAL KNEE ARTHROPLASTY;  Surgeon: [name], MD;  Location: [employer];  Service: Orthopedics;  Laterality: Right;  right total knee arthroplasty, computer assisted  . I&d extremity  08/27/2011    Procedure: IRRIGATION AND DEBRIDEMENT EXTREMITY;  Surgeon: [name], MD;  Location: [place];  Service: Orthopedics;  Laterality: Right;    Family History  Problem Relation Age of Onset  . Heart disease Mother   . Hypertension Mother   . Hypertension Father     Social History   Social History  . Marital Status: Married    Spouse Name: N/A  . Number of Children: N/A  .  Years of Education: N/A   Occupational History  . Not on file.   Social History Main Topics  . Smoking status: Current Every Day Smoker -- 1.00 packs/day for 50 years  . Smokeless tobacco: Never Used  . Alcohol Use: No  . Drug Use: No  . Sexual Activity: Not Currently   Other Topics Concern  . Not on file   Social History Narrative     Current outpatient prescriptions:  .  alprazolam (XANAX) 2 MG tablet, Take 1 tablet (2 mg total) by mouth 2 (two) times daily as needed for anxiety., Disp: 60 tablet, Rfl: 0 .  atenolol (TENORMIN) 100 MG tablet, Take 1 tablet (100 mg total) by mouth daily., Disp: 30 tablet, Rfl: 0 .  cyclobenzaprine (FLEXERIL) 5 MG tablet, TAKE ONE AND  ONE-HALF TABLETS BY MOUTH EVERY NIGHT AT BEDTIME, Disp: 45 tablet, Rfl: 2 .  hydrochlorothiazide (HYDRODIURIL) 25 MG tablet, Take 1 tablet by mouth daily., Disp: , Rfl:  .  hydrOXYzine (ATARAX/VISTARIL) 25 MG tablet, Take 1 tablet by mouth daily as needed., Disp: , Rfl:  .  losartan (COZAAR) 50 MG tablet, Take 1 tablet (50 mg total) by mouth daily., Disp: 30 tablet, Rfl: 2 .  Multiple Vitamin (MULTIVITAMIN PO), Take 1 tablet by mouth daily. , Disp: , Rfl:  .  oxyCODONE (OXY IR/ROXICODONE) 5 MG immediate release tablet, Take 1 tablet (5 mg total) by mouth every 8 (eight) hours as needed for moderate pain., Disp: 90 tablet, Rfl: 0 .  simvastatin (ZOCOR) 40 MG tablet, Take 1 tablet (40 mg total) by mouth daily at 6 PM., Disp: 90 tablet, Rfl: 0 .  traZODone (DESYREL) 150 MG tablet, Take 1 tablet by mouth at bedtime., Disp: , Rfl:   Allergies  Allergen Reactions  . Bacitracin-Neomycin-Polymyxin Other (See Comments)  . Codeine Other (See Comments)    Itching "she can take some forms of it" Itching "she can take some forms of it"  . Gabapentin Nausea Only  . Neosporin  [Neomycin-Bacitracin Zn-Polymyx]   . Pregabalin Other (See Comments)    stomach issues stomach issues     Review of Systems  Respiratory: Negative for shortness of breath.   Cardiovascular: Negative for chest pain and palpitations.  Gastrointestinal: Negative for bowel incontinence.  Genitourinary: Negative for bladder incontinence.  Musculoskeletal: Positive for back pain.  Neurological: Negative for tingling, numbness and headaches.  Psychiatric/Behavioral: The patient is nervous/anxious and has insomnia.      Objective  Filed Vitals:   09/12/15 1332  BP: 132/78  Pulse: 93  Temp: 98 F (36.7 C)  Resp: 20  Height: 5\' 6"  (1.676 m)  Weight: 320 lb 7 oz (145.349 kg)  SpO2: 96%    Physical Exam  Constitutional: She is oriented to person, place, and time and well-developed, well-nourished, and in no distress.    HENT:  Head: Normocephalic and atraumatic.  Cardiovascular: Normal rate and regular rhythm.   Pulmonary/Chest: Effort normal. She has decreased breath sounds. She has wheezes.  Abdominal: Soft. Bowel sounds are normal. There is no tenderness.  Musculoskeletal: She exhibits edema.       Lumbar back: She exhibits tenderness, pain and spasm.       Back:  Neurological: She is alert and oriented to person, place, and time.  Psychiatric: Mood, memory, affect and judgment normal.  Nursing note and vitals reviewed.       Assessment & Plan  1. Anxiety  - alprazolam (XANAX) 2 MG tablet; Take 1 tablet (2  mg total) by mouth 2 (two) times daily as needed for anxiety.  Dispense: 60 tablet; Refill: 0  2. Benign hypertension  - atenolol (TENORMIN) 100 MG tablet; Take 1 tablet (100 mg total) by mouth daily.  Dispense: 90 tablet; Refill: 0  3. Chronic LBP  - oxyCODONE (OXY IR/ROXICODONE) 5 MG immediate release tablet; Take 1 tablet (5 mg total) by mouth every 8 (eight) hours as needed for moderate pain.  Dispense: 90 tablet; Refill: 0  4. Acute bronchitis, unspecified organism  - DG Chest 2 View; Future - azithromycin (ZITHROMAX) 250 MG tablet; 2 tabs po day 1, then 1 tab po q day x 4 days  Dispense: 6 each; Refill: 0 - albuterol (PROVENTIL HFA) 108 (90 Base) MCG/ACT inhaler; Inhale 2 puffs into the lungs every 6 (six) hours as needed for wheezing or shortness of breath.  Dispense: 1 Inhaler; Refill: 1 - benzonatate (TESSALON) 200 MG capsule; Take 1 capsule (200 mg total) by mouth 3 (three) times daily as needed for cough.  Dispense: 20 capsule; Refill: 0   Willian Donson [facility] 09/12/2015 1:40 PM

## 2015-09-17 ENCOUNTER — Telehealth: Payer: Self-pay

## 2015-09-17 NOTE — Telephone Encounter (Signed)
Routed to Dr. Manuella Ghazi for advice and possible new prescription

## 2015-09-18 NOTE — Telephone Encounter (Signed)
If she still has coughing, she may take Robitussin-DM. Please have patient check with her insurance company on less expensive possible alternatives to Albuterol.

## 2015-09-26 DIAGNOSIS — M5416 Radiculopathy, lumbar region: Secondary | ICD-10-CM | POA: Diagnosis not present

## 2015-09-26 DIAGNOSIS — M5136 Other intervertebral disc degeneration, lumbar region: Secondary | ICD-10-CM | POA: Diagnosis not present

## 2015-10-14 ENCOUNTER — Encounter: Payer: Self-pay | Admitting: Family Medicine

## 2015-10-14 ENCOUNTER — Ambulatory Visit (INDEPENDENT_AMBULATORY_CARE_PROVIDER_SITE_OTHER): Payer: Commercial Managed Care - HMO | Admitting: Family Medicine

## 2015-10-14 VITALS — BP 106/62 | HR 88 | Temp 97.6°F | Resp 14 | Ht 66.0 in | Wt 319.2 lb

## 2015-10-14 DIAGNOSIS — M545 Low back pain, unspecified: Secondary | ICD-10-CM

## 2015-10-14 DIAGNOSIS — F419 Anxiety disorder, unspecified: Secondary | ICD-10-CM | POA: Diagnosis not present

## 2015-10-14 DIAGNOSIS — G8929 Other chronic pain: Secondary | ICD-10-CM | POA: Diagnosis not present

## 2015-10-14 MED ORDER — OXYCODONE HCL 5 MG PO TABS: 5.0000 mg | ORAL_TABLET | Freq: Three times a day (TID) | ORAL | Status: DC | PRN

## 2015-10-14 MED ORDER — CYCLOBENZAPRINE HCL 5 MG PO TABS: 7.5000 mg | ORAL_TABLET | Freq: Every day | ORAL | Status: DC

## 2015-10-14 MED ORDER — ALPRAZOLAM 2 MG PO TABS: 2.0000 mg | ORAL_TABLET | Freq: Two times a day (BID) | ORAL | Status: DC | PRN

## 2015-10-14 NOTE — Progress Notes (Signed)
Name: Leah Shah   MRN: [id number]    DOB: [birth year]   Date:10/14/2015       Progress Note  Subjective  Chief Complaint  Chief Complaint  Patient presents with  . Medication Refill    1 month F/U  . Anxiety    Stable, worried about grandchild that is a group home right now.   . Hypertension    Swelling in bilateral ankles and needs refill  . Back Pain    Worst, patient has taken 2 shots and still when standing too much her back will be on fire from the burning sensation. Needs another to [facility].    Anxiety Presents for follow-up visit. The problem has been gradually worsening. Symptoms include excessive worry (worried about her grandson who is in a group home.), insomnia, nervous/anxious behavior and panic. Symptoms occur most days. The severity of symptoms is moderate.   Her past medical history is significant for anxiety/panic attacks. Past treatments include benzodiazephines. The treatment provided significant relief. Compliance with prior treatments has been good.  Back Pain This is a chronic problem. The problem has been gradually worsening since onset. The pain is present in the lumbar spine and sacro-iliac. The quality of the pain is described as aching. The pain is at a severity of 6/10 (injections to her back are not working). The pain is moderate. The pain is worse during the night. The symptoms are aggravated by position, standing and sitting. Pertinent negatives include no bladder incontinence, bowel incontinence, leg pain, numbness, paresis or tingling. Risk factors include obesity. She has tried analgesics for the symptoms. The treatment provided moderate relief.    Past Medical History  Diagnosis Date  . Heart valve problem     was on Redux and it caused some "heart valve damage"-does not see cardiologist  . [id number])   . Anxiety   . Recurrent upper respiratory infection (URI)     treated /w OTC med.   . Arthritis     R knee, L knee - OA   . Hypertension     echoJefm Bryant, wnl 2009? , had taken Redux for diet management  but now  off the market. pt, told that echo wnl.    . Dysrhythmia     "mild arrythmia after taking Redux diet med years ago"no cardio fi  . Shortness of breath     with exertion  . Hyperlipidemia     Past Surgical History  Procedure Laterality Date  . Abdominal hysterectomy      partial-uterus removed  . Tmj arthroplasty      [facility]- 1997  . Heel spur surgery    . Knee arthroscopy      right knee  . Coccyx removal      [facility]  . Eye surgery      states both lens replaced. Not sure if cataract surgery or not.  . Cholecystectomy      2011  . Knee arthroplasty  07/28/2011    Procedure: COMPUTER ASSISTED TOTAL KNEE ARTHROPLASTY;  Surgeon: [name], MD;  Location: [place];  Service: Orthopedics;  Laterality: Right;  right total knee arthroplasty, computer assisted  . I&d extremity  08/27/2011    Procedure: IRRIGATION AND DEBRIDEMENT EXTREMITY;  Surgeon: [name], MD;  Location: [place];  Service: Orthopedics;  Laterality: Right;    Family History  Problem Relation Age of Onset  . Heart disease Mother   . Hypertension Mother   . Hypertension Father  Social History   Social History  . Marital Status: Married    Spouse Name: N/A  . Number of Children: N/A  . Years of Education: N/A   Occupational History  . Not on file.   Social History Main Topics  . Smoking status: Current Every Day Smoker -- 1.00 packs/day for 50 years  . Smokeless tobacco: Never Used  . Alcohol Use: No  . Drug Use: No  . Sexual Activity: Not Currently   Other Topics Concern  . Not on file   Social History Narrative     Current outpatient prescriptions:  .  albuterol (PROVENTIL HFA) 108 (90 Base) MCG/ACT inhaler, Inhale 2 puffs into the lungs every 6 (six) hours as needed for wheezing or shortness of breath., Disp: 1 Inhaler, Rfl: 1 .  alprazolam (XANAX) 2 MG tablet, Take 1 tablet (2 mg  total) by mouth 2 (two) times daily as needed for anxiety., Disp: 60 tablet, Rfl: 0 .  atenolol (TENORMIN) 100 MG tablet, Take 1 tablet (100 mg total) by mouth daily., Disp: 90 tablet, Rfl: 0 .  azithromycin (ZITHROMAX) 250 MG tablet, 2 tabs po day 1, then 1 tab po q day x 4 days, Disp: 6 each, Rfl: 0 .  benzonatate (TESSALON) 200 MG capsule, Take 1 capsule (200 mg total) by mouth 3 (three) times daily as needed for cough., Disp: 20 capsule, Rfl: 0 .  cyclobenzaprine (FLEXERIL) 5 MG tablet, TAKE ONE AND ONE-HALF TABLETS BY MOUTH EVERY NIGHT AT BEDTIME, Disp: 45 tablet, Rfl: 2 .  hydrochlorothiazide (HYDRODIURIL) 25 MG tablet, Take 1 tablet by mouth daily., Disp: , Rfl:  .  hydrOXYzine (ATARAX/VISTARIL) 25 MG tablet, Take 1 tablet by mouth daily as needed., Disp: , Rfl:  .  losartan (COZAAR) 50 MG tablet, Take 1 tablet (50 mg total) by mouth daily., Disp: 30 tablet, Rfl: 2 .  Multiple Vitamin (MULTIVITAMIN PO), Take 1 tablet by mouth daily. , Disp: , Rfl:  .  oxyCODONE (OXY IR/ROXICODONE) 5 MG immediate release tablet, Take 1 tablet (5 mg total) by mouth every 8 (eight) hours as needed for moderate pain., Disp: 90 tablet, Rfl: 0 .  simvastatin (ZOCOR) 40 MG tablet, Take 1 tablet (40 mg total) by mouth daily at 6 PM., Disp: 90 tablet, Rfl: 0 .  traZODone (DESYREL) 150 MG tablet, Take 1 tablet by mouth at bedtime., Disp: , Rfl:   Allergies  Allergen Reactions  . Bacitracin-Neomycin-Polymyxin Other (See Comments)  . Codeine Other (See Comments)    Itching "she can take some forms of it" Itching "she can take some forms of it"  . Gabapentin Nausea Only  . Neosporin  [Neomycin-Bacitracin Zn-Polymyx]   . Pregabalin Other (See Comments)    stomach issues stomach issues     Review of Systems  Gastrointestinal: Negative for bowel incontinence.  Genitourinary: Negative for bladder incontinence.  Musculoskeletal: Positive for back pain.  Neurological: Negative for tingling and numbness.    Psychiatric/Behavioral: The patient is nervous/anxious and has insomnia.     Objective  Filed Vitals:   10/14/15 1356  BP: 106/62  Pulse: 88  Temp: 97.6 F (36.4 C)  TempSrc: Oral  Resp: 14  Height: 5\' 6"  (1.676 m)  Weight: 319 lb 3.2 oz (144.788 kg)  SpO2: 90%    Physical Exam  Constitutional: She is oriented to person, place, and time and well-developed, well-nourished, and in no distress.  HENT:  Head: Normocephalic and atraumatic.  Cardiovascular: Normal rate and regular rhythm.  Pulmonary/Chest: Effort normal and breath sounds normal.  Abdominal: Soft. Bowel sounds are normal.  Musculoskeletal:       Lumbar back: She exhibits tenderness and pain.       Back:  Neurological: She is alert and oriented to person, place, and time.  Nursing note and vitals reviewed.      Assessment & Plan  1. Chronic LBP Will obtain urine drug screen as part of the controlled substances agreement. Refills for oxycodone provided.  - oxyCODONE (OXY IR/ROXICODONE) 5 MG immediate release tablet; Take 1 tablet (5 mg total) by mouth every 8 (eight) hours as needed for moderate pain.  Dispense: 90 tablet; Refill: 0 - cyclobenzaprine (FLEXERIL) 5 MG tablet; Take 1.5 tablets (7.5 mg total) by mouth at bedtime. TAKE ONE AND ONE-HALF TABLETS BY MOUTH EVERY NIGHT AT BEDTIME  Dispense: 45 tablet; Refill: 2 - Drug Screen 12+Alcohol+CRT, Ur  2. Anxiety Recently worse to do very regarding her grandchildren. Continue on alprazolam. Aware of the dependence potential, drug interactions, and side effects. Follow-up in one month. - alprazolam (XANAX) 2 MG tablet; Take 1 tablet (2 mg total) by mouth 2 (two) times daily as needed for anxiety.  Dispense: 60 tablet; Refill: 0   Tyanna Hach Asad A. [facility] 10/14/2015 2:07 PM

## 2015-10-15 DIAGNOSIS — G8929 Other chronic pain: Secondary | ICD-10-CM | POA: Diagnosis not present

## 2015-10-15 DIAGNOSIS — M545 Low back pain: Secondary | ICD-10-CM | POA: Diagnosis not present

## 2015-10-19 LAB — PLEASE NOTE

## 2015-10-19 LAB — DRUG SCREEN 12+ALCOHOL+CRT, UR
Amphetamines, Urine: NEGATIVE ng/mL
BARBITURATE: NEGATIVE ng/mL
BENZODIAZ UR QL: NEGATIVE ng/mL
CANNABINOIDS: NEGATIVE ng/mL
COCAINE (METABOLITE): NEGATIVE ng/mL
Creatinine, Urine: 135.7 mg/dL (ref 20.0–300.0)
ETHANOL U, QUAN: NEGATIVE %
MEPERIDINE: NEGATIVE ng/mL
METHADONE: NEGATIVE ng/mL
OPIATE SCREEN URINE: NEGATIVE ng/mL
Oxycodone/Oxymorphone, Urine: POSITIVE — AB
PROPOXYPHENE: NEGATIVE ng/mL
Phencyclidine: NEGATIVE ng/mL
TRAMADOL: NEGATIVE ng/mL

## 2015-10-25 DIAGNOSIS — M9973 Connective tissue and disc stenosis of intervertebral foramina of lumbar region: Secondary | ICD-10-CM | POA: Insufficient documentation

## 2015-10-25 DIAGNOSIS — M5136 Other intervertebral disc degeneration, lumbar region: Secondary | ICD-10-CM | POA: Diagnosis not present

## 2015-10-31 ENCOUNTER — Telehealth: Payer: Self-pay

## 2015-10-31 NOTE — Telephone Encounter (Signed)
Pt called stating that she needs a referral placed for a spinal specialist in [facility] phone number [phone]. Dr Roland Rack orthopedic you sent her to suggested she go to [facility].

## 2015-11-02 NOTE — Telephone Encounter (Signed)
Called patient to discuss the referral and get more details, left a voice message advised to call back on Monday, 11/04/2015

## 2015-11-11 DIAGNOSIS — M549 Dorsalgia, unspecified: Secondary | ICD-10-CM | POA: Diagnosis not present

## 2015-11-11 DIAGNOSIS — M791 Myalgia: Secondary | ICD-10-CM | POA: Diagnosis not present

## 2015-11-11 DIAGNOSIS — M545 Low back pain: Secondary | ICD-10-CM | POA: Diagnosis not present

## 2015-11-13 ENCOUNTER — Encounter: Payer: Self-pay | Admitting: Family Medicine

## 2015-11-13 ENCOUNTER — Ambulatory Visit (INDEPENDENT_AMBULATORY_CARE_PROVIDER_SITE_OTHER): Payer: Commercial Managed Care - HMO | Admitting: Family Medicine

## 2015-11-13 VITALS — HR 84 | Temp 98.1°F | Resp 18 | Ht 66.0 in | Wt 329.6 lb

## 2015-11-13 DIAGNOSIS — M25475 Effusion, left foot: Secondary | ICD-10-CM | POA: Insufficient documentation

## 2015-11-13 DIAGNOSIS — M7989 Other specified soft tissue disorders: Secondary | ICD-10-CM | POA: Diagnosis not present

## 2015-11-13 DIAGNOSIS — G8929 Other chronic pain: Secondary | ICD-10-CM | POA: Diagnosis not present

## 2015-11-13 DIAGNOSIS — M545 Low back pain, unspecified: Secondary | ICD-10-CM

## 2015-11-13 DIAGNOSIS — F419 Anxiety disorder, unspecified: Secondary | ICD-10-CM

## 2015-11-13 DIAGNOSIS — M25476 Effusion, unspecified foot: Secondary | ICD-10-CM

## 2015-11-13 DIAGNOSIS — M25473 Effusion, unspecified ankle: Secondary | ICD-10-CM

## 2015-11-13 MED ORDER — ALPRAZOLAM 2 MG PO TABS: 2.0000 mg | ORAL_TABLET | Freq: Two times a day (BID) | ORAL | Status: DC | PRN

## 2015-11-13 MED ORDER — FUROSEMIDE 20 MG PO TABS: 20.0000 mg | ORAL_TABLET | Freq: Every day | ORAL | Status: DC

## 2015-11-13 MED ORDER — OXYCODONE HCL 5 MG PO TABS: 5.0000 mg | ORAL_TABLET | Freq: Three times a day (TID) | ORAL | Status: DC | PRN

## 2015-11-13 NOTE — Progress Notes (Signed)
Name: Leah Shah   MRN: [id number]    DOB: Dec 23, 1955   Date:11/13/2015       Progress Note  Subjective  Chief Complaint  Chief Complaint  Patient presents with  . Follow-up    1 mo    HPI  Pt. Presents for medication refills on Alprazolam and Oxycodone. She is on Oxycodone 5 mg three times daily as needed for chronic low back pain, Alprazolam 2mg  twice daily as needed for anxiety. She had UDS testing done last month and returned negative for Benzodiazepines. She is in violation of controlled substances agreement and will be referred to appropriate specialists.    Past Medical History  Diagnosis Date  . Heart valve problem     was on Redux and it caused some "heart valve damage"-does not see cardiologist  . [id number])   . Anxiety   . Recurrent upper respiratory infection (URI)     treated /w OTC med.   . Arthritis     R knee, L knee - OA  . Hypertension     echoJefm Bryant, wnl 2009? , had taken Redux for diet management  but now  off the market. pt, told that echo wnl.    . Dysrhythmia     "mild arrythmia after taking Redux diet med years ago"no cardio fi  . Shortness of breath     with exertion  . Hyperlipidemia     Past Surgical History  Procedure Laterality Date  . Abdominal hysterectomy      partial-uterus removed  . Tmj arthroplasty      [facility]- 1997  . Heel spur surgery    . Knee arthroscopy      right knee  . Coccyx removal      [facility]  . Eye surgery      states both lens replaced. Not sure if cataract surgery or not.  . Cholecystectomy      2011  . Knee arthroplasty  07/28/2011    Procedure: COMPUTER ASSISTED TOTAL KNEE ARTHROPLASTY;  Surgeon: [name], MD;  Location: [place];  Service: Orthopedics;  Laterality: Right;  right total knee arthroplasty, computer assisted  . I&d extremity  08/27/2011    Procedure: IRRIGATION AND DEBRIDEMENT EXTREMITY;  Surgeon: [name], MD;  Location: [place];  Service: Orthopedics;  Laterality:  Right;    Family History  Problem Relation Age of Onset  . Heart disease Mother   . Hypertension Mother   . Hypertension Father     Social History   Social History  . Marital Status: Married    Spouse Name: N/A  . Number of Children: N/A  . Years of Education: N/A   Occupational History  . Not on file.   Social History Main Topics  . Smoking status: Current Every Day Smoker -- 1.00 packs/day for 50 years  . Smokeless tobacco: Never Used  . Alcohol Use: No  . Drug Use: No  . Sexual Activity: Not Currently   Other Topics Concern  . Not on file   Social History Narrative     Current outpatient prescriptions:  .  alprazolam (XANAX) 2 MG tablet, Take 1 tablet (2 mg total) by mouth 2 (two) times daily as needed for anxiety., Disp: 60 tablet, Rfl: 0 .  atenolol (TENORMIN) 100 MG tablet, Take 1 tablet (100 mg total) by mouth daily., Disp: 90 tablet, Rfl: 0 .  cyclobenzaprine (FLEXERIL) 5 MG tablet, Take 1.5 tablets (7.5 mg total) by mouth at bedtime.  TAKE ONE AND ONE-HALF TABLETS BY MOUTH EVERY NIGHT AT BEDTIME, Disp: 45 tablet, Rfl: 2 .  hydrochlorothiazide (HYDRODIURIL) 25 MG tablet, Take 1 tablet by mouth daily., Disp: , Rfl:  .  hydrOXYzine (ATARAX/VISTARIL) 25 MG tablet, Take 1 tablet by mouth daily as needed., Disp: , Rfl:  .  losartan (COZAAR) 50 MG tablet, Take 1 tablet (50 mg total) by mouth daily., Disp: 30 tablet, Rfl: 2 .  oxyCODONE (OXY IR/ROXICODONE) 5 MG immediate release tablet, Take 1 tablet (5 mg total) by mouth every 8 (eight) hours as needed for moderate pain., Disp: 90 tablet, Rfl: 0 .  simvastatin (ZOCOR) 40 MG tablet, Take 1 tablet (40 mg total) by mouth daily at 6 PM., Disp: 90 tablet, Rfl: 0 .  traZODone (DESYREL) 150 MG tablet, Take 1 tablet by mouth at bedtime., Disp: , Rfl:   Allergies  Allergen Reactions  . Bacitracin-Neomycin-Polymyxin Other (See Comments)  . Codeine Other (See Comments)    Itching "she can take some forms of it" Itching "she  can take some forms of it"  . Gabapentin Nausea Only  . Neosporin  [Neomycin-Bacitracin Zn-Polymyx]   . Pregabalin Other (See Comments)    stomach issues stomach issues     Review of Systems  Musculoskeletal: Positive for back pain and joint pain.    Objective  Filed Vitals:   11/13/15 1331  Pulse: 84  Temp: 98.1 F (36.7 C)  TempSrc: Oral  Resp: 18  Height: 5\' 6"  (1.676 m)  Weight: 329 lb 9.6 oz (149.506 kg)  SpO2: 90%    Physical Exam  Constitutional: She is oriented to person, place, and time and well-developed, well-nourished, and in no distress.  HENT:  Head: Normocephalic and atraumatic.  Cardiovascular: Normal rate and regular rhythm.   Pulmonary/Chest: Effort normal and breath sounds normal.  Musculoskeletal:       Right ankle: She exhibits swelling.       Left ankle: She exhibits swelling.       Lumbar back: She exhibits tenderness and pain.  2+ pitting edema lower extremities  Neurological: She is alert and oriented to person, place, and time.  Psychiatric: Memory, affect and judgment normal.  Nursing note and vitals reviewed.    Assessment & Plan  1. Anxiety Explained that we will only prescribe 2 week supply of alprazolam and referral to psychiatry so that patient can establish with a new provider for continuation of care. - alprazolam (XANAX) 2 MG tablet; Take 1 tablet (2 mg total) by mouth 2 (two) times daily as needed for anxiety.  Dispense: 30 tablet; Refill: 0 - Ambulatory referral to Psychiatry  2. Chronic LBP Similarly, explained that we'll only prescribe 2 week supply for her opioid therapy and referral to pain clinic so that patient can establish care provider for continuation of care. - oxyCODONE (OXY IR/ROXICODONE) 5 MG immediate release tablet; Take 1 tablet (5 mg total) by mouth every 8 (eight) hours as needed for moderate pain.  Dispense: 45 tablet; Refill: 0 - Ambulatory referral to Pain Clinic  3. Leg swelling Likely dependent  pitting edema, start on furosemide 20 mg daily, follow-up in one month. - furosemide (LASIX) 20 MG tablet; Take 1 tablet (20 mg total) by mouth daily.  Dispense: 30 tablet; Refill: 2   Sharae Zappulla Asad A. [facility] 11/13/2015 1:38 PM

## 2015-12-03 ENCOUNTER — Other Ambulatory Visit: Payer: Self-pay | Admitting: Family Medicine

## 2015-12-07 ENCOUNTER — Other Ambulatory Visit: Payer: Self-pay | Admitting: Family Medicine

## 2015-12-12 ENCOUNTER — Ambulatory Visit (INDEPENDENT_AMBULATORY_CARE_PROVIDER_SITE_OTHER): Payer: Commercial Managed Care - HMO | Admitting: Family Medicine

## 2015-12-12 ENCOUNTER — Encounter: Payer: Self-pay | Admitting: Family Medicine

## 2015-12-12 VITALS — BP 120/80 | HR 78 | Temp 97.9°F | Resp 16 | Ht 66.0 in | Wt 320.0 lb

## 2015-12-12 DIAGNOSIS — F419 Anxiety disorder, unspecified: Secondary | ICD-10-CM | POA: Diagnosis not present

## 2015-12-12 DIAGNOSIS — I10 Essential (primary) hypertension: Secondary | ICD-10-CM | POA: Diagnosis not present

## 2015-12-12 DIAGNOSIS — E785 Hyperlipidemia, unspecified: Secondary | ICD-10-CM | POA: Diagnosis not present

## 2015-12-12 DIAGNOSIS — R739 Hyperglycemia, unspecified: Secondary | ICD-10-CM | POA: Diagnosis not present

## 2015-12-12 MED ORDER — ALPRAZOLAM 2 MG PO TABS: 2.0000 mg | ORAL_TABLET | Freq: Two times a day (BID) | ORAL | Status: DC | PRN

## 2015-12-12 MED ORDER — ATENOLOL 100 MG PO TABS: 100.0000 mg | ORAL_TABLET | Freq: Every day | ORAL | Status: DC

## 2015-12-12 MED ORDER — SIMVASTATIN 40 MG PO TABS
40.0000 mg | ORAL_TABLET | Freq: Every day | ORAL | Status: DC
Start: 2015-12-12 — End: 2016-03-12

## 2015-12-12 NOTE — Progress Notes (Signed)
Name: Leah Shah   MRN: [id number]    DOB: 05-20-1956   Date:12/12/2015       Progress Note  Subjective  Chief Complaint  Chief Complaint  Patient presents with  . Follow-up    1 mo  . Hyperlipidemia  . Medication Refill    atenolol 100 mg / simvastatin 40 mg     Hyperlipidemia This is a chronic problem. Recent lipid tests were reviewed and are normal. Pertinent negatives include no chest pain, leg pain, myalgias or shortness of breath. Current antihyperlipidemic treatment includes statins.  Hypertension This is a chronic problem. The problem is unchanged. Associated symptoms include anxiety. Pertinent negatives include no blurred vision, chest pain, headaches, palpitations or shortness of breath. Past treatments include beta blockers and angiotensin blockers. There is no history of kidney disease, CAD/MI or CVA.  Anxiety Presents for follow-up visit. The problem has been unchanged. Symptoms include excessive worry (worrying about grandson who is now in a group home), nervous/anxious behavior and panic. Patient reports no chest pain, palpitations or shortness of breath. Symptoms occur most days. The severity of symptoms is moderate and causing significant distress.   Her past medical history is significant for anxiety/panic attacks. Past treatments include benzodiazephines. Compliance with prior treatments has been good.  Patient had an inconsistent urine drug screen, she was referred to psychiatry but has not been able to find a psychiatrist yet. She will run out of Alprazolam provided during last office visit.   Past Medical History  Diagnosis Date  . Heart valve problem     was on Redux and it caused some "heart valve damage"-does not see cardiologist  . [id number])   . Anxiety   . Recurrent upper respiratory infection (URI)     treated /w OTC med.   . Arthritis     R knee, L knee - OA  . Hypertension     echoJefm Bryant, wnl 2009? , had taken Redux for diet management   but now  off the market. pt, told that echo wnl.    . Dysrhythmia     "mild arrythmia after taking Redux diet med years ago"no cardio fi  . Shortness of breath     with exertion  . Hyperlipidemia     Past Surgical History  Procedure Laterality Date  . Abdominal hysterectomy      partial-uterus removed  . Tmj arthroplasty      [facility]- 1997  . Heel spur surgery    . Knee arthroscopy      right knee  . Coccyx removal      [facility]  . Eye surgery      states both lens replaced. Not sure if cataract surgery or not.  . Cholecystectomy      2011  . Knee arthroplasty  07/28/2011    Procedure: COMPUTER ASSISTED TOTAL KNEE ARTHROPLASTY;  Surgeon: [name], MD;  Location: [place];  Service: Orthopedics;  Laterality: Right;  right total knee arthroplasty, computer assisted  . I&d extremity  08/27/2011    Procedure: IRRIGATION AND DEBRIDEMENT EXTREMITY;  Surgeon: [name], MD;  Location: [place];  Service: Orthopedics;  Laterality: Right;    Family History  Problem Relation Age of Onset  . Heart disease Mother   . Hypertension Mother   . Hypertension Father     Social History   Social History  . Marital Status: Married    Spouse Name: N/A  . Number of Children: N/A  . Years  of Education: N/A   Occupational History  . Not on file.   Social History Main Topics  . Smoking status: Current Every Day Smoker -- 1.00 packs/day for 50 years  . Smokeless tobacco: Never Used  . Alcohol Use: No  . Drug Use: No  . Sexual Activity: Not Currently   Other Topics Concern  . Not on file   Social History Narrative     Current outpatient prescriptions:  .  alprazolam (XANAX) 2 MG tablet, Take 1 tablet (2 mg total) by mouth 2 (two) times daily as needed for anxiety., Disp: 30 tablet, Rfl: 0 .  atenolol (TENORMIN) 100 MG tablet, Take 1 tablet (100 mg total) by mouth daily., Disp: 90 tablet, Rfl: 0 .  cyclobenzaprine (FLEXERIL) 5 MG tablet, Take 1.5 tablets (7.5 mg total) by  mouth at bedtime. TAKE ONE AND ONE-HALF TABLETS BY MOUTH EVERY NIGHT AT BEDTIME, Disp: 45 tablet, Rfl: 2 .  furosemide (LASIX) 20 MG tablet, Take 1 tablet (20 mg total) by mouth daily., Disp: 30 tablet, Rfl: 2 .  hydrOXYzine (ATARAX/VISTARIL) 25 MG tablet, Take 1 tablet by mouth daily as needed., Disp: , Rfl:  .  losartan (COZAAR) 50 MG tablet, Take 1 tablet (50 mg total) by mouth daily., Disp: 30 tablet, Rfl: 2 .  losartan (COZAAR) 50 MG tablet, TAKE 1 TABLET (50 MG TOTAL) BY MOUTH DAILY., Disp: 30 tablet, Rfl: 2 .  oxyCODONE (OXY IR/ROXICODONE) 5 MG immediate release tablet, Take 1 tablet (5 mg total) by mouth every 8 (eight) hours as needed for moderate pain., Disp: 45 tablet, Rfl: 0 .  simvastatin (ZOCOR) 40 MG tablet, Take 1 tablet (40 mg total) by mouth daily at 6 PM., Disp: 90 tablet, Rfl: 0 .  traZODone (DESYREL) 150 MG tablet, Take 1 tablet by mouth at bedtime., Disp: , Rfl:   Allergies  Allergen Reactions  . Bacitracin-Neomycin-Polymyxin Other (See Comments)  . Codeine Other (See Comments)    Itching "she can take some forms of it" Itching "she can take some forms of it"  . Gabapentin Nausea Only  . Neosporin  [Neomycin-Bacitracin Zn-Polymyx]   . Pregabalin Other (See Comments)    stomach issues stomach issues     Review of Systems  Eyes: Negative for blurred vision.  Respiratory: Negative for shortness of breath.   Cardiovascular: Negative for chest pain and palpitations.  Musculoskeletal: Negative for myalgias.  Neurological: Negative for headaches.  Psychiatric/Behavioral: The patient is nervous/anxious.     Objective  Filed Vitals:   12/12/15 1340  BP: 120/80  Pulse: 78  Temp: 97.9 F (36.6 C)  TempSrc: Oral  Resp: 16  Height: 5\' 6"  (1.676 m)  Weight: 320 lb (145.151 kg)  SpO2: 93%    Physical Exam  Constitutional: She is oriented to person, place, and time and well-developed, well-nourished, and in no distress.  Cardiovascular: Normal rate, regular  rhythm, S1 normal, S2 normal and normal heart sounds.   No murmur heard. Pulmonary/Chest: Effort normal and breath sounds normal. She has no wheezes.  Abdominal: Soft. Bowel sounds are normal.  Neurological: She is alert and oriented to person, place, and time.  Psychiatric: Mood, memory, affect and judgment normal. She does not exhibit a depressed mood.  Nursing note and vitals reviewed.     Assessment & Plan  1. Anxiety We will provide 2 week supply of Alprazolam and stress that she must find a psychiatrist and establish care as soon as possible to be able to continue receiving alprazolam.  Patient verbalized agreement - alprazolam Duanne Moron) 2 MG tablet; Take 1 tablet (2 mg total) by mouth 2 (two) times daily as needed for anxiety.  Dispense: 30 tablet; Refill: 0  2. Benign hypertension  - atenolol (TENORMIN) 100 MG tablet; Take 1 tablet (100 mg total) by mouth daily.  Dispense: 90 tablet; Refill: 0  3. Dyslipidemia  - simvastatin (ZOCOR) 40 MG tablet; Take 1 tablet (40 mg total) by mouth daily at 6 PM.  Dispense: 90 tablet; Refill: 0 - Lipid Profile - Comprehensive Metabolic Panel (CMET)  4. Hyperglycemia  - POCT HgB A1C - POCT Glucose (CBG)   Leah Shah Asad A. [facility] 12/12/2015 1:56 PM

## 2015-12-13 LAB — LIPID PANEL
CHOLESTEROL TOTAL: 86 mg/dL — AB (ref 100–199)
Chol/HDL Ratio: 3.6 ratio units (ref 0.0–4.4)
HDL: 24 mg/dL — AB (ref >39)
LDL CALC: 43 mg/dL (ref 0–99)
TRIGLYCERIDES: 95 mg/dL (ref 0–149)
VLDL CHOLESTEROL CAL: 19 mg/dL (ref 5–40)

## 2015-12-13 LAB — COMPREHENSIVE METABOLIC PANEL
A/G RATIO: 1 — AB (ref 1.2–2.2)
ALBUMIN: 3.2 g/dL — AB (ref 3.6–4.8)
ALK PHOS: 142 IU/L — AB (ref 39–117)
ALT: 33 IU/L — ABNORMAL HIGH (ref 0–32)
AST: 72 IU/L — AB (ref 0–40)
BILIRUBIN TOTAL: 1.1 mg/dL (ref 0.0–1.2)
BUN / CREAT RATIO: 12 (ref 12–28)
BUN: 7 mg/dL — ABNORMAL LOW (ref 8–27)
CO2: 23 mmol/L (ref 18–29)
CREATININE: 0.6 mg/dL (ref 0.57–1.00)
Calcium: 8.6 mg/dL — ABNORMAL LOW (ref 8.7–10.3)
Chloride: 103 mmol/L (ref 96–106)
GFR calc Af Amer: 114 mL/min/{1.73_m2} (ref >59)
GFR calc non Af Amer: 99 mL/min/{1.73_m2} (ref >59)
GLOBULIN, TOTAL: 3.3 g/dL (ref 1.5–4.5)
Glucose: 103 mg/dL — ABNORMAL HIGH (ref 65–99)
POTASSIUM: 4.4 mmol/L (ref 3.5–5.2)
SODIUM: 141 mmol/L (ref 134–144)
Total Protein: 6.5 g/dL (ref 6.0–8.5)

## 2015-12-18 DIAGNOSIS — M545 Low back pain: Secondary | ICD-10-CM | POA: Diagnosis not present

## 2015-12-18 DIAGNOSIS — M791 Myalgia: Secondary | ICD-10-CM | POA: Diagnosis not present

## 2015-12-19 ENCOUNTER — Telehealth: Payer: Self-pay | Admitting: Family Medicine

## 2015-12-19 NOTE — Telephone Encounter (Signed)
Please schedule patient for an appointment to discuss the recommendations of the spine specialist and anything else that she would like to talk to me about.

## 2015-12-19 NOTE — Telephone Encounter (Signed)
Requesting return call to discuss what the spine doctor said. They are suggesting that the primary doctor prescribe the medications because it will cost more for if they wrote the prescription. And there are other things that he said that she would like to discuss with you.

## 2015-12-23 NOTE — Telephone Encounter (Signed)
Already have appointment for 01-10-16

## 2016-01-06 ENCOUNTER — Other Ambulatory Visit: Payer: Self-pay | Admitting: Family Medicine

## 2016-01-10 ENCOUNTER — Encounter: Payer: Self-pay | Admitting: Family Medicine

## 2016-01-10 ENCOUNTER — Ambulatory Visit (INDEPENDENT_AMBULATORY_CARE_PROVIDER_SITE_OTHER): Payer: Commercial Managed Care - HMO | Admitting: Family Medicine

## 2016-01-10 VITALS — BP 122/77 | HR 82 | Temp 97.7°F | Resp 17 | Ht 66.0 in | Wt 315.1 lb

## 2016-01-10 DIAGNOSIS — F419 Anxiety disorder, unspecified: Secondary | ICD-10-CM

## 2016-01-10 DIAGNOSIS — M545 Low back pain, unspecified: Secondary | ICD-10-CM

## 2016-01-10 DIAGNOSIS — G8929 Other chronic pain: Secondary | ICD-10-CM | POA: Diagnosis not present

## 2016-01-10 MED ORDER — OXYCODONE HCL 5 MG PO TABS: 5.0000 mg | ORAL_TABLET | Freq: Three times a day (TID) | ORAL | Status: DC | PRN

## 2016-01-10 MED ORDER — ALPRAZOLAM 2 MG PO TABS: 2.0000 mg | ORAL_TABLET | Freq: Two times a day (BID) | ORAL | Status: DC | PRN

## 2016-01-10 NOTE — Progress Notes (Signed)
Name: Leah Shah   MRN: [id number]    DOB: 09-25-1955   Date:01/10/2016       Progress Note  Subjective  Chief Complaint  Chief Complaint  Patient presents with  . Follow-up    1 mo  . Medication Refill    Anxiety Presents for follow-up visit. The problem has been unchanged. Symptoms include depressed mood, excessive worry, muscle tension, nervous/anxious behavior and panic. The severity of symptoms is moderate and causing significant distress.   Her past medical history is significant for anxiety/panic attacks. Past treatments include benzodiazephines.  Back Pain This is a chronic problem. The problem occurs constantly. The problem is unchanged. The pain is present in the lumbar spine. The pain is at a severity of 7/10. She has tried analgesics for the symptoms.     Past Medical History  Diagnosis Date  . Heart valve problem     was on Redux and it caused some "heart valve damage"-does not see cardiologist  . [id number])   . Anxiety   . Recurrent upper respiratory infection (URI)     treated /w OTC med.   . Arthritis     R knee, L knee - OA  . Hypertension     echoJefm Bryant, wnl 2009? , had taken Redux for diet management  but now  off the market. pt, told that echo wnl.    . Dysrhythmia     "mild arrythmia after taking Redux diet med years ago"no cardio fi  . Shortness of breath     with exertion  . Hyperlipidemia     Past Surgical History  Procedure Laterality Date  . Abdominal hysterectomy      partial-uterus removed  . Tmj arthroplasty      [facility]- 1997  . Heel spur surgery    . Knee arthroscopy      right knee  . Coccyx removal      [facility]  . Eye surgery      states both lens replaced. Not sure if cataract surgery or not.  . Cholecystectomy      2011  . Knee arthroplasty  07/28/2011    Procedure: COMPUTER ASSISTED TOTAL KNEE ARTHROPLASTY;  Surgeon: [name], MD;  Location: [place];  Service: Orthopedics;  Laterality: Right;  right total  knee arthroplasty, computer assisted  . I&d extremity  08/27/2011    Procedure: IRRIGATION AND DEBRIDEMENT EXTREMITY;  Surgeon: [name], MD;  Location: [place];  Service: Orthopedics;  Laterality: Right;    Family History  Problem Relation Age of Onset  . Heart disease Mother   . Hypertension Mother   . Hypertension Father     Social History   Social History  . Marital Status: Married    Spouse Name: N/A  . Number of Children: N/A  . Years of Education: N/A   Occupational History  . Not on file.   Social History Main Topics  . Smoking status: Current Every Day Smoker -- 1.00 packs/day for 50 years  . Smokeless tobacco: Never Used  . Alcohol Use: No  . Drug Use: No  . Sexual Activity: Not Currently   Other Topics Concern  . Not on file   Social History Narrative     Current outpatient prescriptions:  .  alprazolam (XANAX) 2 MG tablet, Take 1 tablet (2 mg total) by mouth 2 (two) times daily as needed for anxiety., Disp: 30 tablet, Rfl: 0 .  atenolol (TENORMIN) 100 MG tablet, Take 1  tablet (100 mg total) by mouth daily., Disp: 90 tablet, Rfl: 0 .  cyclobenzaprine (FLEXERIL) 5 MG tablet, TAKE ONE AND ONE-HALF TABLETS BY MOUTH EVERY NIGHT AT BEDTIME, Disp: 45 tablet, Rfl: 2 .  furosemide (LASIX) 20 MG tablet, Take 1 tablet (20 mg total) by mouth daily., Disp: 30 tablet, Rfl: 2 .  hydrOXYzine (ATARAX/VISTARIL) 25 MG tablet, Take 1 tablet by mouth daily as needed., Disp: , Rfl:  .  losartan (COZAAR) 50 MG tablet, Take 1 tablet (50 mg total) by mouth daily., Disp: 30 tablet, Rfl: 2 .  losartan (COZAAR) 50 MG tablet, TAKE 1 TABLET (50 MG TOTAL) BY MOUTH DAILY., Disp: 30 tablet, Rfl: 2 .  oxyCODONE (OXY IR/ROXICODONE) 5 MG immediate release tablet, Take 1 tablet (5 mg total) by mouth every 8 (eight) hours as needed for moderate pain., Disp: 45 tablet, Rfl: 0 .  simvastatin (ZOCOR) 40 MG tablet, Take 1 tablet (40 mg total) by mouth daily at 6 PM., Disp: 90 tablet, Rfl: 0 .   traZODone (DESYREL) 150 MG tablet, Take 1 tablet by mouth at bedtime., Disp: , Rfl:   Allergies  Allergen Reactions  . Bacitracin-Neomycin-Polymyxin Other (See Comments)  . Codeine Other (See Comments)    Itching "she can take some forms of it" Itching "she can take some forms of it"  . Gabapentin Nausea Only  . Neosporin  [Neomycin-Bacitracin Zn-Polymyx]   . Pregabalin Other (See Comments)    stomach issues stomach issues     Review of Systems  Musculoskeletal: Positive for back pain.  Psychiatric/Behavioral: The patient is nervous/anxious.     Objective  Filed Vitals:   01/10/16 1122  BP: 122/77  Pulse: 82  Temp: 97.7 F (36.5 C)  TempSrc: Oral  Resp: 17  Height: 5\' 6"  (1.676 m)  Weight: 315 lb 1.6 oz (142.928 kg)  SpO2: 93%    Physical Exam  Constitutional: She is oriented to person, place, and time and well-developed, well-nourished, and in no distress.  HENT:  Head: Normocephalic and atraumatic.  Cardiovascular: Normal rate, regular rhythm and normal heart sounds.   No murmur heard. Pulmonary/Chest: Effort normal and breath sounds normal.  Musculoskeletal:       Lumbar back: She exhibits tenderness and pain. She exhibits no spasm.       Back:  Neurological: She is alert and oriented to person, place, and time.  Nursing note and vitals reviewed.     Assessment & Plan  1. Anxiety Inconsistent urine drug screen resulted by [facility]. Due to previous instances of erroneous reporting, we will reorder's urine drug screen by a different agency. Patient reports that she is taking both alprazolam and oxycodone as prescribed. Refills provided and follow-up in one month - alprazolam (XANAX) 2 MG tablet; Take 1 tablet (2 mg total) by mouth 2 (two) times daily as needed for anxiety.  Dispense: 60 tablet; Refill: 0  2. Chronic LBP Stable and responsive to opioid therapy. Refills provided - oxyCODONE (OXY IR/ROXICODONE) 5 MG immediate release tablet; Take 1 tablet  (5 mg total) by mouth every 8 (eight) hours as needed for moderate pain.  Dispense: 90 tablet; Refill: 0   Taishawn Smaldone Asad [facility] 01/10/2016 11:42 AM

## 2016-02-18 ENCOUNTER — Ambulatory Visit: Payer: Commercial Managed Care - HMO | Admitting: Family Medicine

## 2016-02-24 ENCOUNTER — Encounter: Payer: Self-pay | Admitting: Family Medicine

## 2016-02-24 ENCOUNTER — Ambulatory Visit (INDEPENDENT_AMBULATORY_CARE_PROVIDER_SITE_OTHER): Payer: Commercial Managed Care - HMO | Admitting: Family Medicine

## 2016-02-24 VITALS — BP 130/70 | HR 83 | Temp 98.3°F | Resp 16 | Ht 66.0 in | Wt 305.0 lb

## 2016-02-24 DIAGNOSIS — F05 Delirium due to known physiological condition: Secondary | ICD-10-CM | POA: Diagnosis not present

## 2016-02-24 DIAGNOSIS — M25562 Pain in left knee: Secondary | ICD-10-CM

## 2016-02-24 DIAGNOSIS — M545 Low back pain: Secondary | ICD-10-CM

## 2016-02-24 DIAGNOSIS — G8929 Other chronic pain: Secondary | ICD-10-CM | POA: Diagnosis not present

## 2016-02-24 DIAGNOSIS — F419 Anxiety disorder, unspecified: Secondary | ICD-10-CM

## 2016-02-24 DIAGNOSIS — T798XXA Other early complications of trauma, initial encounter: Secondary | ICD-10-CM

## 2016-02-24 LAB — CBC WITH DIFFERENTIAL/PLATELET
BASOS PCT: 0 %
Basophils Absolute: 0 cells/uL (ref 0–200)
Eosinophils Absolute: 272 cells/uL (ref 15–500)
Eosinophils Relative: 4 %
HEMATOCRIT: 37.6 % (ref 35.0–45.0)
Hemoglobin: 12.4 g/dL (ref 11.7–15.5)
LYMPHS ABS: 2244 {cells}/uL (ref 850–3900)
LYMPHS PCT: 33 %
MCH: 37.6 pg — ABNORMAL HIGH (ref 27.0–33.0)
MCHC: 33 g/dL (ref 32.0–36.0)
MCV: 113.9 fL — ABNORMAL HIGH (ref 80.0–100.0)
MONO ABS: 612 {cells}/uL (ref 200–950)
MPV: 9.9 fL (ref 7.5–12.5)
Monocytes Relative: 9 %
NEUTROS ABS: 3672 {cells}/uL (ref 1500–7800)
Neutrophils Relative %: 54 %
Platelets: 116 10*3/uL — ABNORMAL LOW (ref 140–400)
RBC: 3.3 MIL/uL — AB (ref 3.80–5.10)
RDW: 15.4 % — AB (ref 11.0–15.0)
WBC: 6.8 10*3/uL (ref 3.8–10.8)

## 2016-02-24 LAB — COMPLETE METABOLIC PANEL WITH GFR
ALT: 35 U/L — AB (ref 6–29)
AST: 93 U/L — AB (ref 10–35)
Albumin: 3.1 g/dL — ABNORMAL LOW (ref 3.6–5.1)
Alkaline Phosphatase: 142 U/L — ABNORMAL HIGH (ref 33–130)
BILIRUBIN TOTAL: 2 mg/dL — AB (ref 0.2–1.2)
BUN: 12 mg/dL (ref 7–25)
CO2: 26 mmol/L (ref 20–31)
CREATININE: 0.72 mg/dL (ref 0.50–0.99)
Calcium: 8.4 mg/dL — ABNORMAL LOW (ref 8.6–10.4)
Chloride: 108 mmol/L (ref 98–110)
GFR, Est Non African American: >89 mL/min (ref >=60)
Glucose, Bld: 139 mg/dL — ABNORMAL HIGH (ref 65–99)
Potassium: 4.3 mmol/L (ref 3.5–5.3)
Sodium: 142 mmol/L (ref 135–146)
TOTAL PROTEIN: 6.5 g/dL (ref 6.1–8.1)

## 2016-02-24 MED ORDER — ALPRAZOLAM 2 MG PO TABS: 2.0000 mg | ORAL_TABLET | Freq: Two times a day (BID) | ORAL | 0 refills | Status: DC | PRN

## 2016-02-24 MED ORDER — OXYCODONE HCL 5 MG PO TABS: 5.0000 mg | ORAL_TABLET | Freq: Three times a day (TID) | ORAL | 0 refills | Status: DC | PRN

## 2016-02-24 NOTE — Progress Notes (Signed)
Name: Leah Shah   MRN: [id number]    DOB: Nov 20, 1955   Date:02/24/2016       Progress Note  Subjective  Chief Complaint  Chief Complaint  Patient presents with  . Pain    medication refills  . Anxiety  . Fall    patient fell 2 weeks ago getting out of bed. Layed in floor over 8 hours. Now having memory issues    Anxiety  Presents for follow-up visit. Symptoms include depressed mood, excessive worry, nervous/anxious behavior and panic. Patient reports no insomnia. Symptoms occur most days. The quality of sleep is good.    Fall  The accident occurred more than 1 week ago (6 weeks ago). The fall occurred while walking. She landed on carpet. The pain is present in the face, left knee and left upper leg (landed on the floor face down, since then her left knee hurts more). The pain is at a severity of 8/10. The pain is moderate. Pertinent negatives include no fever or headaches. Associated symptoms comments: Her sister tells me that ever since the patient had a fall, she has been confused, shows me her texts which were hard to comprehend.. Treatments tried: Has been taking Percocet prescribed for her low back pain.  Back Pain  This is a chronic problem. The problem occurs constantly. The problem is unchanged. The pain is present in the lumbar spine. The pain is at a severity of 5/10. Pertinent negatives include no fever or headaches. She has tried analgesics for the symptoms.    Past Medical History:  Diagnosis Date  . Anxiety   . Arthritis    R knee, L knee - OA  . Dysrhythmia    "mild arrythmia after taking Redux diet med years ago"no cardio fi  . Headache(784.0)   . Heart valve problem    was on Redux and it caused some "heart valve damage"-does not see cardiologist  . Hyperlipidemia   . Hypertension    echoJefm Bryant, wnl 2009? , had taken Redux for diet management  but now  off the market. pt, told that echo wnl.    . Recurrent upper respiratory infection (URI)    treated /w  OTC med.   . Shortness of breath    with exertion    Past Surgical History:  Procedure Laterality Date  . ABDOMINAL HYSTERECTOMY     partial-uterus removed  . CHOLECYSTECTOMY     2011  . COCCYX REMOVAL     [facility]  . EYE SURGERY     states both lens replaced. Not sure if cataract surgery or not.  [name] HEEL SPUR SURGERY    . I&D EXTREMITY  08/27/2011   Procedure: IRRIGATION AND DEBRIDEMENT EXTREMITY;  Surgeon: [name], MD;  Location: [place];  Service: Orthopedics;  Laterality: Right;  . KNEE ARTHROPLASTY  07/28/2011   Procedure: COMPUTER ASSISTED TOTAL KNEE ARTHROPLASTY;  Surgeon: [name], MD;  Location: [place];  Service: Orthopedics;  Laterality: Right;  right total knee arthroplasty, computer assisted  . KNEE ARTHROSCOPY     right knee  . TMJ ARTHROPLASTY     [facility]- 1997    Family History  Problem Relation Age of Onset  . Heart disease Mother   . Hypertension Mother   . Hypertension Father     Social History   Social History  . Marital status: Married    Spouse name: N/A  . Number of children: N/A  . Years of education: N/A  Occupational History  . Not on file.   Social History Main Topics  . Smoking status: Current Every Day Smoker    Packs/day: 1.00    Years: 50.00  . Smokeless tobacco: Never Used  . Alcohol use No  . Drug use: No  . Sexual activity: Not Currently   Other Topics Concern  . Not on file   Social History Narrative  . No narrative on file     Current Outpatient Prescriptions:  .  alprazolam (XANAX) 2 MG tablet, Take 1 tablet (2 mg total) by mouth 2 (two) times daily as needed for anxiety., Disp: 60 tablet, Rfl: 0 .  atenolol (TENORMIN) 100 MG tablet, Take 1 tablet (100 mg total) by mouth daily., Disp: 90 tablet, Rfl: 0 .  cyclobenzaprine (FLEXERIL) 5 MG tablet, TAKE ONE AND ONE-HALF TABLETS BY MOUTH EVERY NIGHT AT BEDTIME, Disp: 45 tablet, Rfl: 2 .  furosemide (LASIX) 20 MG tablet, Take 1 tablet (20 mg total) by mouth  daily., Disp: 30 tablet, Rfl: 2 .  hydrOXYzine (ATARAX/VISTARIL) 25 MG tablet, Take 1 tablet by mouth daily as needed., Disp: , Rfl:  .  losartan (COZAAR) 50 MG tablet, TAKE 1 TABLET (50 MG TOTAL) BY MOUTH DAILY., Disp: 30 tablet, Rfl: 2 .  oxyCODONE (OXY IR/ROXICODONE) 5 MG immediate release tablet, Take 1 tablet (5 mg total) by mouth every 8 (eight) hours as needed for moderate pain., Disp: 90 tablet, Rfl: 0 .  simvastatin (ZOCOR) 40 MG tablet, Take 1 tablet (40 mg total) by mouth daily at 6 PM., Disp: 90 tablet, Rfl: 0 .  traZODone (DESYREL) 150 MG tablet, Take 1 tablet by mouth at bedtime., Disp: , Rfl:   Allergies  Allergen Reactions  . Bacitracin-Neomycin-Polymyxin Other (See Comments)  . Codeine Other (See Comments)    Itching "she can take some forms of it" Itching "she can take some forms of it"  . Gabapentin Nausea Only  . Neosporin  [Neomycin-Bacitracin Zn-Polymyx]   . Pregabalin Other (See Comments)    stomach issues stomach issues     Review of Systems  Constitutional: Negative for chills and fever.  Eyes: Negative for blurred vision and double vision.  Musculoskeletal: Positive for back pain and joint pain.  Neurological: Negative for headaches.  Psychiatric/Behavioral: Negative for depression. The patient is nervous/anxious. The patient does not have insomnia.      Objective  Vitals:   02/24/16 1500  BP: 130/70  Pulse: 83  Resp: 16  Temp: 98.3 F (36.8 C)  TempSrc: Oral  SpO2: 94%  Weight: (!) 305 lb (138.3 kg)  Height: 5\' 6"  (1.676 m)    Physical Exam  Constitutional: She is oriented to person, place, and time and well-developed, well-nourished, and in no distress.  HENT:  Head: Normocephalic and atraumatic.  Cardiovascular: Normal rate, regular rhythm and normal heart sounds.   No murmur heard. Pulmonary/Chest: Effort normal and breath sounds normal. She has no wheezes.  Musculoskeletal:       Left knee: She exhibits swelling and bony tenderness.  She exhibits no deformity, no laceration and no erythema. Tenderness found. LCL tenderness noted.       Lumbar back: She exhibits tenderness and pain.       Back:  Neurological: She is alert and oriented to person, place, and time. No cranial nerve deficit.  Nursing note and vitals reviewed.    Assessment & Plan  1. Acute confusion following injury, initial encounter [facility]) The patient's sister reports that patient has been  instructed episodes of confusion following the fall 6 weeks ago in her bedroom. We will obtain initial laboratory and imaging workup associated with a fall and resulting confusion. - CBC with Differential - COMPLETE METABOLIC PANEL WITH GFR - Urine Culture - Urinalysis, Routine w reflex microscopic - DG Chest 2 View; Future - CT Head Wo Contrast; Future  2. Left lateral knee pain  - DG Knee Complete 4 Views Left; Future  3. Anxiety Moderately worse since the fall and the resulting left knee pain. Continue on alprazolam taken twice daily, compliant with controlled substances agreement - alprazolam (XANAX) 2 MG tablet; Take 1 tablet (2 mg total) by mouth 2 (two) times daily as needed for anxiety.  Dispense: 60 tablet; Refill: 0  4. Chronic LBP  - oxyCODONE (OXY IR/ROXICODONE) 5 MG immediate release tablet; Take 1 tablet (5 mg total) by mouth every 8 (eight) hours as needed for moderate pain.  Dispense: 90 tablet; Refill: 0   Victoria Euceda Asad [facility] 02/24/2016 3:06 PM

## 2016-02-26 LAB — URINALYSIS, ROUTINE W REFLEX MICROSCOPIC

## 2016-02-27 ENCOUNTER — Other Ambulatory Visit: Payer: Self-pay | Admitting: Family Medicine

## 2016-02-27 ENCOUNTER — Ambulatory Visit
Admission: RE | Admit: 2016-02-27 | Discharge: 2016-02-27 | Disposition: A | Discharge status: Home / Self Care | Payer: Commercial Managed Care - HMO | Source: Ambulatory Visit | Attending: Family Medicine | Admitting: Family Medicine

## 2016-02-27 DIAGNOSIS — T798XXA Other early complications of trauma, initial encounter: Secondary | ICD-10-CM | POA: Diagnosis not present

## 2016-02-27 DIAGNOSIS — F05 Delirium due to known physiological condition: Secondary | ICD-10-CM

## 2016-02-27 DIAGNOSIS — M25562 Pain in left knee: Secondary | ICD-10-CM | POA: Diagnosis not present

## 2016-02-27 DIAGNOSIS — I517 Cardiomegaly: Secondary | ICD-10-CM | POA: Insufficient documentation

## 2016-02-27 DIAGNOSIS — S8992XA Unspecified injury of left lower leg, initial encounter: Secondary | ICD-10-CM | POA: Diagnosis not present

## 2016-02-27 DIAGNOSIS — M1712 Unilateral primary osteoarthritis, left knee: Secondary | ICD-10-CM | POA: Diagnosis not present

## 2016-02-28 LAB — URINALYSIS, ROUTINE W REFLEX MICROSCOPIC
BILIRUBIN URINE: NEGATIVE
Glucose, UA: NEGATIVE
HGB URINE DIPSTICK: NEGATIVE
KETONES UR: NEGATIVE
Leukocytes, UA: NEGATIVE
NITRITE: NEGATIVE
PROTEIN: NEGATIVE
SPECIFIC GRAVITY, URINE: 1.005 (ref 1.001–1.035)
pH: 7.5 (ref 5.0–8.0)

## 2016-02-29 LAB — URINE CULTURE

## 2016-03-04 ENCOUNTER — Ambulatory Visit: Payer: Commercial Managed Care - HMO

## 2016-03-04 ENCOUNTER — Other Ambulatory Visit: Payer: Self-pay | Admitting: Family Medicine

## 2016-03-06 ENCOUNTER — Other Ambulatory Visit: Payer: Self-pay | Admitting: Family Medicine

## 2016-03-06 ENCOUNTER — Ambulatory Visit: Admission: RE | Admit: 2016-03-06 | Payer: Commercial Managed Care - HMO | Source: Ambulatory Visit

## 2016-03-12 ENCOUNTER — Other Ambulatory Visit: Payer: Self-pay | Admitting: Family Medicine

## 2016-03-12 DIAGNOSIS — I10 Essential (primary) hypertension: Secondary | ICD-10-CM

## 2016-03-12 DIAGNOSIS — E785 Hyperlipidemia, unspecified: Secondary | ICD-10-CM

## 2016-03-13 ENCOUNTER — Telehealth: Payer: Self-pay | Admitting: Family Medicine

## 2016-03-13 ENCOUNTER — Other Ambulatory Visit: Payer: Self-pay | Admitting: Family Medicine

## 2016-03-13 MED ORDER — HYDROXYZINE HCL 25 MG PO TABS: 25.0000 mg | ORAL_TABLET | Freq: Every day | ORAL | 0 refills | Status: DC | PRN

## 2016-03-16 ENCOUNTER — Ambulatory Visit
Admission: RE | Admit: 2016-03-16 | Discharge: 2016-03-16 | Disposition: A | Discharge status: Home / Self Care | Payer: Commercial Managed Care - HMO | Source: Ambulatory Visit | Attending: Family Medicine | Admitting: Family Medicine

## 2016-03-16 DIAGNOSIS — F05 Delirium due to known physiological condition: Secondary | ICD-10-CM | POA: Diagnosis not present

## 2016-03-16 DIAGNOSIS — T798XXA Other early complications of trauma, initial encounter: Secondary | ICD-10-CM | POA: Diagnosis not present

## 2016-03-16 DIAGNOSIS — R410 Disorientation, unspecified: Secondary | ICD-10-CM | POA: Diagnosis not present

## 2016-03-16 DIAGNOSIS — X58XXXA Exposure to other specified factors, initial encounter: Secondary | ICD-10-CM | POA: Insufficient documentation

## 2016-03-20 ENCOUNTER — Other Ambulatory Visit: Payer: Self-pay | Admitting: Family Medicine

## 2016-03-30 ENCOUNTER — Ambulatory Visit (INDEPENDENT_AMBULATORY_CARE_PROVIDER_SITE_OTHER): Payer: Commercial Managed Care - HMO | Admitting: Family Medicine

## 2016-03-30 ENCOUNTER — Encounter: Payer: Self-pay | Admitting: Family Medicine

## 2016-03-30 VITALS — BP 131/73 | HR 88 | Temp 97.7°F | Resp 16 | Ht 66.0 in | Wt 309.9 lb

## 2016-03-30 DIAGNOSIS — M545 Low back pain: Secondary | ICD-10-CM

## 2016-03-30 DIAGNOSIS — F419 Anxiety disorder, unspecified: Secondary | ICD-10-CM

## 2016-03-30 DIAGNOSIS — M7989 Other specified soft tissue disorders: Secondary | ICD-10-CM

## 2016-03-30 DIAGNOSIS — G8929 Other chronic pain: Secondary | ICD-10-CM | POA: Diagnosis not present

## 2016-03-30 DIAGNOSIS — L89891 Pressure ulcer of other site, stage 1: Secondary | ICD-10-CM

## 2016-03-30 MED ORDER — ALPRAZOLAM 2 MG PO TABS: 2.0000 mg | ORAL_TABLET | Freq: Two times a day (BID) | ORAL | 0 refills | Status: DC | PRN

## 2016-03-30 MED ORDER — OXYCODONE HCL 5 MG PO TABS: 5.0000 mg | ORAL_TABLET | Freq: Three times a day (TID) | ORAL | 0 refills | Status: DC | PRN

## 2016-03-30 MED ORDER — FUROSEMIDE 20 MG PO TABS: 20.0000 mg | ORAL_TABLET | Freq: Two times a day (BID) | ORAL | 2 refills | Status: DC

## 2016-03-30 NOTE — Progress Notes (Signed)
Name: Leah Shah   MRN: [id number]    DOB: 09-Sep-1955   Date:03/30/2016       Progress Note  Subjective  Chief Complaint  Chief Complaint  Patient presents with  . Follow-up    1 mo  . Medication Refill    Anxiety  Presents for follow-up visit. The problem has been unchanged. Symptoms include excessive worry (worrying about grandson who is now in a group home), insomnia, nervous/anxious behavior and panic. Patient reports no obsessions. Symptoms occur most days. The severity of symptoms is moderate and causing significant distress.   Her past medical history is significant for anxiety/panic attacks. Past treatments include benzodiazephines. Compliance with prior treatments has been good.  Back Pain  This is a chronic problem. The problem is unchanged. The pain is present in the lumbar spine and sacro-iliac. The pain is at a severity of 9/10 ('pulled' her back while bending over in the bathroom to brush her teeth). The symptoms are aggravated by bending. Pertinent negatives include no bladder incontinence or bowel incontinence.   Patient also wants me to take a look at the wound on the bottom of her left great toe, been there for some time, not draining, states that it burns and hurts to touch. She has been applying topical antibiotics which have helped marginally. Past Medical History:  Diagnosis Date  . Anxiety   . Arthritis    R knee, L knee - OA  . Dysrhythmia    "mild arrythmia after taking Redux diet med years ago"no cardio fi  . Headache(784.0)   . Heart valve problem    was on Redux and it caused some "heart valve damage"-does not see cardiologist  . Hyperlipidemia   . Hypertension    echoJefm Bryant, wnl 2009? , had taken Redux for diet management  but now  off the market. pt, told that echo wnl.    . Recurrent upper respiratory infection (URI)    treated /w OTC med.   . Shortness of breath    with exertion    Past Surgical History:  Procedure Laterality Date  .  ABDOMINAL HYSTERECTOMY     partial-uterus removed  . CHOLECYSTECTOMY     2011  . COCCYX REMOVAL     [facility]  . EYE SURGERY     states both lens replaced. Not sure if cataract surgery or not.  [name] HEEL SPUR SURGERY    . I&D EXTREMITY  08/27/2011   Procedure: IRRIGATION AND DEBRIDEMENT EXTREMITY;  Surgeon: [name], MD;  Location: [place];  Service: Orthopedics;  Laterality: Right;  . KNEE ARTHROPLASTY  07/28/2011   Procedure: COMPUTER ASSISTED TOTAL KNEE ARTHROPLASTY;  Surgeon: [name], MD;  Location: [place];  Service: Orthopedics;  Laterality: Right;  right total knee arthroplasty, computer assisted  . KNEE ARTHROSCOPY     right knee  . TMJ ARTHROPLASTY     [facility]- [facility]    Family History  Problem Relation Age of Onset  . Heart disease Mother   . Hypertension Mother   . Hypertension Father     Social History   Social History  . Marital status: Married    Spouse name: N/A  . Number of children: N/A  . Years of education: N/A   Occupational History  . Not on file.   Social History Main Topics  . Smoking status: Current Every Day Smoker    Packs/day: 1.00    Years: 50.00  . Smokeless tobacco: Never Used  .  Alcohol use No  . Drug use: No  . Sexual activity: Not Currently   Other Topics Concern  . Not on file   Social History Narrative  . No narrative on file     Current Outpatient Prescriptions:  .  alprazolam (XANAX) 2 MG tablet, Take 1 tablet (2 mg total) by mouth 2 (two) times daily as needed for anxiety., Disp: 60 tablet, Rfl: 0 .  atenolol (TENORMIN) 100 MG tablet, TAKE 1 TABLET (100 MG TOTAL) BY MOUTH DAILY., Disp: 90 tablet, Rfl: 0 .  cyclobenzaprine (FLEXERIL) 5 MG tablet, TAKE ONE AND ONE-HALF TABLETS BY MOUTH EVERY NIGHT AT BEDTIME, Disp: 45 tablet, Rfl: 2 .  furosemide (LASIX) 20 MG tablet, Take 1 tablet (20 mg total) by mouth daily., Disp: 30 tablet, Rfl: 2 .  hydrOXYzine (ATARAX/VISTARIL) 25 MG tablet, Take 1 tablet (25 mg total) by  mouth daily as needed., Disp: 90 tablet, Rfl: 0 .  losartan (COZAAR) 50 MG tablet, TAKE 1 TABLET (50 MG TOTAL) BY MOUTH DAILY., Disp: 30 tablet, Rfl: 2 .  oxyCODONE (OXY IR/ROXICODONE) 5 MG immediate release tablet, Take 1 tablet (5 mg total) by mouth every 8 (eight) hours as needed for moderate pain., Disp: 90 tablet, Rfl: 0 .  simvastatin (ZOCOR) 40 MG tablet, TAKE 1 TABLET (40 MG TOTAL) BY MOUTH DAILY AT 6 PM., Disp: 90 tablet, Rfl: 0 .  traZODone (DESYREL) 150 MG tablet, Take 1 tablet by mouth at bedtime., Disp: , Rfl:   Allergies  Allergen Reactions  . Bacitracin-Neomycin-Polymyxin Other (See Comments)  . Codeine Other (See Comments)    Itching "she can take some forms of it" Itching "she can take some forms of it"  . Gabapentin Nausea Only  . Neosporin  [Neomycin-Bacitracin Zn-Polymyx]   . Pregabalin Other (See Comments)    stomach issues stomach issues     Review of Systems  Cardiovascular: Positive for leg swelling.  Gastrointestinal: Negative for bowel incontinence.  Genitourinary: Negative for bladder incontinence.  Musculoskeletal: Positive for back pain and joint pain.  Psychiatric/Behavioral: Positive for depression. The patient is nervous/anxious and has insomnia.     Objective  Vitals:   03/30/16 1520  BP: 131/73  Pulse: 88  Resp: 16  Temp: 97.7 F (36.5 C)  TempSrc: Oral  SpO2: 95%  Weight: (!) 309 lb 14.4 oz (140.6 kg)  Height: 5\' 6"  (1.676 m)    Physical Exam  Constitutional: She is well-developed, well-nourished, and in no distress.  Cardiovascular: Normal rate, regular rhythm, S1 normal, S2 normal and normal heart sounds.   No murmur heard. Pulmonary/Chest: Effort normal and breath sounds normal. She has no wheezes.  Musculoskeletal:       Right ankle: She exhibits swelling.       Left ankle: She exhibits swelling.       Lumbar back: She exhibits tenderness, pain and spasm.       Back:  3+ pitting edema bilateral lower extremities.  Skin:    Healing wound at the ventral surface of left great toe, well-circumscribed no drainage, no surrounding erythema.   Psychiatric: Mood, memory, affect and judgment normal.  Nursing note and vitals reviewed.   Assessment & Plan  1. Anxiety Stable, continue on alprazolam as prescribed, patient compliant with controlled substances agreement - alprazolam (XANAX) 2 MG tablet; Take 1 tablet (2 mg total) by mouth 2 (two) times daily as needed for anxiety.  Dispense: 60 tablet; Refill: 0  2. Leg swelling Worsening dependent leg swelling, increase Lasix  to 20 mg twice a day, advised to elevate her legs frequently - furosemide (LASIX) 20 MG tablet; Take 1 tablet (20 mg total) by mouth 2 (two) times daily.  Dispense: 60 tablet; Refill: 2  3. Chronic LBP Stable, has some lumbar pain since this a.m. after bending over the sink. Suspect muscle spasm. Chronic low back pain responsive to opioid therapy. Patient compliant with controlled substances agreement. Refills provided and follow-up in one month - oxyCODONE (OXY IR/ROXICODONE) 5 MG immediate release tablet; Take 1 tablet (5 mg total) by mouth every 8 (eight) hours as needed for moderate pain.  Dispense: 90 tablet; Refill: 0  4. Pressure ulcer of dorsum of left foot, stage 1 Mild surrounding erythema, no active drainage, likely due to excess weight on the bottom of both feet. Referral to wound care. - AMB referral to wound care center   [facility] 03/30/2016 3:38 PM

## 2016-03-31 ENCOUNTER — Other Ambulatory Visit: Payer: Self-pay | Admitting: Family Medicine

## 2016-03-31 NOTE — Telephone Encounter (Signed)
This was completed on 03-13-16

## 2016-04-01 ENCOUNTER — Encounter: Payer: Commercial Managed Care - HMO | Attending: Internal Medicine | Admitting: Internal Medicine

## 2016-04-01 DIAGNOSIS — L97521 Non-pressure chronic ulcer of other part of left foot limited to breakdown of skin: Secondary | ICD-10-CM | POA: Diagnosis not present

## 2016-04-01 DIAGNOSIS — I10 Essential (primary) hypertension: Secondary | ICD-10-CM | POA: Diagnosis not present

## 2016-04-01 DIAGNOSIS — X58XXXA Exposure to other specified factors, initial encounter: Secondary | ICD-10-CM | POA: Diagnosis not present

## 2016-04-01 DIAGNOSIS — M1990 Unspecified osteoarthritis, unspecified site: Secondary | ICD-10-CM | POA: Diagnosis not present

## 2016-04-01 DIAGNOSIS — R0602 Shortness of breath: Secondary | ICD-10-CM | POA: Insufficient documentation

## 2016-04-01 DIAGNOSIS — I890 Lymphedema, not elsewhere classified: Secondary | ICD-10-CM | POA: Diagnosis not present

## 2016-04-01 DIAGNOSIS — F17210 Nicotine dependence, cigarettes, uncomplicated: Secondary | ICD-10-CM | POA: Diagnosis not present

## 2016-04-01 DIAGNOSIS — E785 Hyperlipidemia, unspecified: Secondary | ICD-10-CM | POA: Insufficient documentation

## 2016-04-01 DIAGNOSIS — F419 Anxiety disorder, unspecified: Secondary | ICD-10-CM | POA: Diagnosis not present

## 2016-04-01 DIAGNOSIS — S90852A Superficial foreign body, left foot, initial encounter: Secondary | ICD-10-CM | POA: Insufficient documentation

## 2016-04-01 DIAGNOSIS — S91322D Laceration with foreign body, left foot, subsequent encounter: Secondary | ICD-10-CM | POA: Diagnosis not present

## 2016-04-01 DIAGNOSIS — R600 Localized edema: Secondary | ICD-10-CM | POA: Diagnosis not present

## 2016-04-02 NOTE — Progress Notes (Signed)
LAURITA, RUSHER ([id number]) Visit Report for 04/01/2016 Abuse/Suicide Risk Screen Details Patient Name: Leah Shah, Leah Shah Date of Service: 04/01/2016 3:15 PM Medical Record Patient Account Number: [id number] Number: Treating RN: Ahmed Prima 09-08-1955 (60 y.o. Other Clinician: Date of Birth/Sex: Female) Treating ROBSON, MICHAEL Primary Care Physician/Extender: Wyline Beady, SYED Physician: Referring Physician: Manuella Ghazi, SYED Weeks in Treatment: 0 Abuse/Suicide Risk Screen Items Answer ABUSE/SUICIDE RISK SCREEN: Has anyone close to you tried to hurt or harm you recentlyo No Do you feel uncomfortable with anyone in your familyo No Has anyone forced you do things that you didnot want to doo No Do you have any thoughts of harming yourselfo No Patient displays signs or symptoms of abuse and/or neglect. No Electronic Signature(s) Signed: 04/01/2016 5:55:37 PM By: Alric Quan Entered By: Alric Quan on 04/01/2016 15:57:53 Leah Shah ([id number]) -------------------------------------------------------------------------------- Activities of Daily Living Details Patient Name: Leah Shah Date of Service: 04/01/2016 3:15 PM Medical Record Patient Account Number: [id number] Number: Treating RN: Ahmed Prima 1956-03-29 (60 y.o. Other Clinician: Date of Birth/Sex: Female) Treating ROBSON, MICHAEL Primary Care Physician/Extender: Wyline Beady, SYED Physician: Referring Physician: Manuella Ghazi, SYED Weeks in Treatment: 0 Activities of Daily Living Items Answer Activities of Daily Living (Please select one for each item) Drive Automobile Completely Able Take Medications Completely Able Use Telephone Completely Able Care for Appearance Completely Able Use Toilet Completely Able Bath / Shower Completely Able Dress Self Completely Able Feed Self Completely Able Walk Completely Able Get In / Out Bed Completely Able Housework Completely Able Prepare Meals Completely  [place] for Self Completely Able Electronic Signature(s) Signed: 04/01/2016 5:55:37 PM By: Alric Quan Entered By: Alric Quan on 04/01/2016 16:00:22 Leah Shah ([id number]) -------------------------------------------------------------------------------- Education Assessment Details Patient Name: Leah Shah Date of Service: 04/01/2016 3:15 PM Medical Record Patient Account Number: [id number] Number: Treating RN: Ahmed Prima 14-Jun-1956 (60 y.o. Other Clinician: Date of Birth/Sex: Female) Treating ROBSON, MICHAEL Primary Care Physician/Extender: Wyline Beady, SYED Physician: Referring Physician: Keith Rake Weeks in Treatment: 0 Primary Learner Assessed: Patient Learning Preferences/Education Level/Primary Language Learning Preference: Explanation, Printed Material Highest Education Level: High School Preferred Language: English Cognitive Barrier Assessment/Beliefs Language Barrier: No Translator Needed: No Memory Deficit: No Emotional Barrier: No Cultural/Religious Beliefs Affecting Medical No Care: Physical Barrier Assessment Impaired Vision: Yes Glasses Impaired Hearing: No Decreased Hand dexterity: No Knowledge/Comprehension Assessment Knowledge Level: High Comprehension Level: High Ability to understand written High instructions: Ability to understand verbal High instructions: Motivation Assessment Anxiety Level: Calm Cooperation: Cooperative Education Importance: Acknowledges Need Interest in Health Problems: Asks Questions Perception: Coherent Willingness to Engage in Self- High Management Activities: High Leah Shah, Leah Shah ([id number]) Readiness to Engage in Self- Management Activities: Electronic Signature(s) Signed: 04/01/2016 5:55:37 PM By: Alric Quan Entered By: Alric Quan on 04/01/2016 16:00:47 Leah Shah  ([id number]) -------------------------------------------------------------------------------- Fall Risk Assessment Details Patient Name: Leah Shah Date of Service: 04/01/2016 3:15 PM Medical Record Patient Account Number: [id number] Number: Treating RN: Ahmed Prima 1956-06-20 (60 y.o. Other Clinician: Date of Birth/Sex: Female) Treating ROBSON, MICHAEL Primary Care Physician/Extender: Wyline Beady, SYED Physician: Referring Physician: Manuella Ghazi, SYED Weeks in Treatment: 0 Fall Risk Assessment Items Have you had 2 or more falls in the last 12 monthso 0 No Have you had any fall that resulted in injury in the last 12 monthso 0 Yes FALL RISK ASSESSMENT: History of falling - immediate or within 3 months 25 Yes Secondary diagnosis 15 Yes Ambulatory aid None/bed rest/wheelchair/nurse 0  Yes Crutches/cane/walker 0 No Furniture 0 No IV Access/Saline Lock 0 No Gait/Training Normal/bed rest/immobile 0 No Weak 0 No Impaired 0 No Mental Status Oriented to own ability 0 Yes Electronic Signature(s) Signed: 04/01/2016 5:55:37 PM By: Alric Quan Entered By: Alric Quan on 04/01/2016 16:02:31 Leah Shah ([id number]) -------------------------------------------------------------------------------- Nutrition Risk Assessment Details Patient Name: Leah Shah Date of Service: 04/01/2016 3:15 PM Medical Record Patient Account Number: [id number] Number: Treating RN: Ahmed Prima September 08, 1955 (60 y.o. Other Clinician: Date of Birth/Sex: Female) Treating ROBSON, MICHAEL Primary Care Physician/Extender: Wyline Beady, SYED Physician: Referring Physician: Manuella Ghazi, SYED Weeks in Treatment: 0 Height (in): 68 Weight (lbs): 305 Body Mass Index (BMI): 46.4 Nutrition Risk Assessment Items NUTRITION RISK SCREEN: I have an illness or condition that made me change the kind and/or 2 Yes amount of food I eat I eat fewer than two meals per day 0 No I eat few fruits and  vegetables, or milk products 0 No I have three or more drinks of beer, liquor or wine almost every day 0 No I have tooth or mouth problems that make it hard for me to eat 0 No I don't always have enough money to buy the food I need 0 No I eat alone most of the time 0 No I take three or more different prescribed or over-the-counter drugs a 1 Yes day Without wanting to, I have lost or gained 10 pounds in the last six 2 Yes months I am not always physically able to shop, cook and/or feed myself 0 No Nutrition Protocols Good Risk Protocol Moderate Risk Protocol Electronic Signature(s) Signed: 04/01/2016 5:55:37 PM By: Alric Quan Entered By: Alric Quan on 04/01/2016 16:04:56

## 2016-04-10 NOTE — Progress Notes (Signed)
ELIONA, GUREVICH ([id number]) Visit Report for 04/01/2016 Chief Complaint Document Details Patient Name: Leah Shah, Leah Shah Date of Service: 04/01/2016 3:15 PM Medical Record Patient Account Number: [id number] Number: Treating RN: Ahmed Prima Jan 17, 1956 (60 y.o. Other Clinician: Date of Birth/Sex: Female) Treating ROBSON, MICHAEL Primary Care Physician/Extender: Wyline Beady, SYED Physician: Referring Physician: Keith Rake Weeks in Treatment: 0 Information Obtained from: Patient Chief Complaint 04/01/16 patient is here for review of the wound on the plantar aspect of her left great toe Electronic Signature(s) Signed: 04/10/2016 8:10:29 AM By: Linton Ham MD Entered By: Linton Ham on 04/01/2016 17:41:08 Leah Shah ([id number]) -------------------------------------------------------------------------------- HPI Details Patient Name: Leah Shah Date of Service: 04/01/2016 3:15 PM Medical Record Patient Account Number: [id number] Number: Treating RN: Ahmed Prima Apr 15, 1956 (60 y.o. Other Clinician: Date of Birth/Sex: Female) Treating ROBSON, MICHAEL Primary Care Physician/Extender: Wyline Beady, SYED Physician: Referring Physician: Keith Rake Weeks in Treatment: 0 History of Present Illness HPI Description: 04/02/16; this is a patient who came in with a problem of a wound on her left great toe for roughly a month. She states that the area began to hurt and she noted a black spot on the plantar aspect of the left first toe. This grew in dimensions. She was referred here by her primary physician. The patient is not a diabetic. She does not have a history of foot wounds but does have a history of a wound on her right leg treated at the [facility] 2 years ago. She does not have a history of PAD her ABIs in this clinic were 1.18 on the left Electronic Signature(s) Signed: 04/10/2016 8:10:29 AM By: Linton Ham MD Entered By: Linton Ham on  04/01/2016 17:42:44 Leah Shah ([id number]) -------------------------------------------------------------------------------- Physical Exam Details Patient Name: Leah Shah Date of Service: 04/01/2016 3:15 PM Medical Record Patient Account Number: [id number] Number: Treating RN: Ahmed Prima 02-25-1956 (60 y.o. Other Clinician: Date of Birth/Sex: Female) Treating ROBSON, MICHAEL Primary Care Physician/Extender: Wyline Beady, SYED Physician: Referring Physician: Manuella Ghazi, SYED Weeks in Treatment: 0 Constitutional Sitting or standing Blood Pressure is within target range for patient.. Pulse regular and within target range for patient.[name] Respirations regular, non-labored and within target range.. Temperature is normal and within the target range for the patient.. Patient's appearance is neat and clean. Appears in no acute distress. Well nourished and well developed.. Eyes Conjunctivae clear. No discharge.[name] Respiratory Respiratory effort is easy and symmetric bilaterally. Rate is normal at rest and on room air.. Bilateral breath sounds are clear and equal in all lobes with no wheezes, rales or rhonchi.. Cardiovascular Heart rhythm and rate regular, without murmur or gallop. No evidence of CHF. Through the edema bilaterally in her feet I was able to feel her dorsalis pedis pulses. Edema present in both extremities. There is some pitting to this but I think this is mostly lymphedema. Gastrointestinal (GI) Obese but no masses.. Lymphatic None palpable in the popliteal or inguinal areas. Psychiatric No evidence of depression, anxiety, or agitation. Calm, cooperative, and communicative. Appropriate interactions and affect.. Notes Wound exam; the area in question is on the plantar left great toe. There was no swelling or erythema. Some dark callus was noted on the plantar aspect of the toe. Using a curette I gently remove the callus. I was able to identify the dark spot the  patient was referring to. I think this was probably a foreign body. With some difficulty I was able to remove this. There is really  no open area after this procedure. Electronic Signature(s) Signed: 04/10/2016 8:10:29 AM By: Linton Ham MD Entered By: Linton Ham on 04/01/2016 17:44:53 Leah Shah ([id number]) -------------------------------------------------------------------------------- Physician Orders Details Patient Name: Leah Shah Date of Service: 04/01/2016 3:15 PM Medical Record Patient Account Number: [id number] Number: Treating RN: Ahmed Prima [birth year] (60 y.o. Other Clinician: Date of Birth/Sex: Female) Treating ROBSON, MICHAEL Primary Care Physician/Extender: Wyline Beady, SYED Physician: Referring Physician: Keith Rake Weeks in Treatment: 0 Verbal / Phone Orders: Yes Clinician: Pinkerton, Debi Read Back and Verified: Yes Diagnosis Coding Discharge From [facility] Services o Discharge from [place] pressure off of callused area for a week. Call our office if you have any questions or concerns. Electronic Signature(s) Signed: 04/01/2016 5:55:37 PM By: Alric Quan Signed: 04/10/2016 8:10:29 AM By: Linton Ham MD Entered By: Alric Quan on 04/01/2016 16:36:59 Leah Shah ([id number]) -------------------------------------------------------------------------------- Problem List Details Patient Name: Leah Shah Date of Service: 04/01/2016 3:15 PM Medical Record Patient Account Number: [id number] Number: Treating RN: Ahmed Prima October 02, 1955 (60 y.o. 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 Other Clinician: Date of Birth/Sex: Female) Treating ROBSON, MICHAEL Primary Care Physician/Extender: Wyline Beady, SYED Physician: Referring Physician: Keith Rake Weeks in Treatment: 0 Subjective Chief Complaint Information obtained from Patient 04/01/16 patient is here for review of the wound on the plantar aspect of her left great toe History of Present Illness (HPI) 04/02/16; this is a patient who came in with a problem of a wound on her left great toe for roughly a month. She states that the area began to hurt and she noted a black spot on the plantar aspect of the left first toe. This grew in dimensions. She was referred here by her primary physician. The patient is not a diabetic. She does not have a history of foot wounds but does have a history of a wound on her right leg treated at the [facility] 2 years ago. She does not have a history of PAD her ABIs in this clinic were 1.18 on the left Wound History Patient presents with 1 open wound that has been present for approximately 03/06/16. Patient has been treating wound in the following manner: nothing. Laboratory tests have not been performed in the last month. Patient reportedly has not tested positive for an antibiotic resistant organism. Patient reportedly has not tested positive for osteomyelitis. Patient  reportedly has not had testing performed to evaluate circulation in the legs. Patient experiences the following problems associated with their wounds: swelling. Patient History Information obtained from Patient. Allergies codeine Family History Heart Disease - Siblings, Mother, Hypertension - Siblings, Mother, Father, Lung Disease - Father, Siblings, Stroke - Maternal Grandparents, Thyroid Problems - Mother, Maternal Grandparents, No family history of Cancer, Diabetes, Hereditary Spherocytosis, Kidney Disease, Seizures, Tuberculosis. Social  History Current some day smoker - a pack a day, Marital Status - Married, Alcohol Use - Never, Drug Use - No History, Caffeine Use - Daily. Leah Shah, Leah Shah ([id number]) Medical History Eyes Patient has history of Cataracts - surgery Cardiovascular Patient has history of Hypertension Musculoskeletal Patient has history of Osteoarthritis Review of Systems (ROS) Constitutional Symptoms ([place]) The patient has no complaints or symptoms. Eyes Complains or has symptoms of Glasses / Contacts. Ear/Nose/Mouth/Throat The patient has no complaints or symptoms. Hematologic/Lymphatic The patient has no complaints or symptoms. Respiratory Complains or has symptoms of Shortness of Breath - with exertion, recurrent URI Cardiovascular dysrhythmia heart valve problem hyperlipidemia Gastrointestinal The patient has no complaints or symptoms. Endocrine The patient has no complaints or symptoms. Genitourinary The patient has no complaints or symptoms. Immunological The patient has no complaints or symptoms. Integumentary (Skin) Complains or has symptoms of Wounds. Neurologic The patient has no complaints or symptoms. Oncologic The patient has no complaints or symptoms. Psychiatric Complains or has symptoms of Anxiety. Objective Constitutional Sitting or standing Blood Pressure is within target range for patient.. Pulse regular and within  target range for patient.[name] Respirations regular, non-labored and within target range.. Temperature is normal and within the target range for the patient.. Patient's appearance is neat and clean. Appears in no acute distress. Well Leah Shah, LISING. ([id number]) nourished and well developed.. Vitals Time Taken: 3:43 PM, Height: 68 in, Source: Stated, Weight: 305 lbs, Source: Stated, BMI: 46.4, Temperature: 98.1 F, Pulse: 71 bpm, Respiratory Rate: 18 breaths/min, Blood Pressure: 100/62 mmHg. Eyes Conjunctivae clear. No discharge.[name] Respiratory Respiratory effort is easy and symmetric bilaterally. Rate is normal at rest and on room air.. Bilateral breath sounds are clear and equal in all lobes with no wheezes, rales or rhonchi.. Cardiovascular Heart rhythm and rate regular, without murmur or gallop. No evidence of CHF. Through the edema bilaterally in her feet I was able to feel her dorsalis pedis pulses. Edema present in both extremities. There is some pitting to this but I think this is mostly lymphedema. Gastrointestinal (GI) Obese but no masses.. Lymphatic None palpable in the popliteal or inguinal areas. Psychiatric No evidence of depression, anxiety, or agitation. Calm, cooperative, and communicative. Appropriate interactions and affect.. General Notes: Wound exam; the area in question is on the plantar left great toe. There was no swelling or erythema. Some dark callus was noted on the plantar aspect of the toe. Using a curette I gently remove the callus. I was able to identify the dark spot the patient was referring to. I think this was probably a foreign body. With some difficulty I was able to remove this. There is really no open area after this procedure. 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 Call our office if you have any questions or concerns. #1 after a procedure with a curet to remove callus and probably a foreign body [splinter] there is really no open area on this toe. We wrap the toe and Kerlix and Coban and gave her an offloading shoe to make sure that this does not develop into something more ominous. However as she left the clinic there was no open area. #2 patient has significant  edema in her legs. The cause of this is not totally clear however I think most of it is lymphedema. She does have some pitting edema especially in her dorsal feet I could see no evidence of systemic fluid overload. It is likely she is going to need some form of compression stockings soon. She does have a history of a wound on the right leg likely related to the lymphedema Electronic Signature(s) Signed: 04/10/2016 8:10:29 AM By: Linton Ham MD Entered By: Linton Ham on 04/01/2016 17:46:59 Leah Shah ([id number]) -------------------------------------------------------------------------------- ROS/PFSH Details Patient Name: Leah Shah Date of Service: 04/01/2016 3:15 PM Medical Record Patient Account Number: [id number] Number: Treating RN: Ahmed Prima [birth year] (60 y.o. Other Clinician: Date of Birth/Sex: Female) Treating ROBSON, MICHAEL Primary Care Physician/Extender: Wyline Beady, SYED Physician: Referring Physician: Manuella Ghazi, SYED Weeks in Treatment: 0 Information Obtained From Patient Wound History Do you currently have one or more open woundso Yes How many open wounds do you currently haveo 1 Approximately how long have you had your woundso 03/06/16 How have you been treating your wound(s) until nowo nothing Has your wound(s) ever healed and then re-openedo No Have you had any lab work done in the past  montho No Have you tested positive for an antibiotic resistant organism (MRSA, VRE)o No Have you tested positive for osteomyelitis (bone infection)o No Have you had any tests for circulation on your legso No Have you had other problems associated with your woundso Swelling Eyes Complaints and Symptoms: Positive for: Glasses / Contacts Medical History: Positive for: Cataracts - surgery Respiratory Complaints and Symptoms: Positive for: Shortness of Breath - with exertion Review of System Notes: recurrent URI Integumentary (Skin) Complaints and Symptoms: Positive for: Wounds Psychiatric Complaints and Symptoms: Positive for: Anxiety Leah Shah, Leah Shah ([id number]) Constitutional Symptoms (General Health) Complaints and Symptoms: No Complaints or Symptoms Ear/Nose/Mouth/Throat Complaints and Symptoms: No Complaints or Symptoms Hematologic/Lymphatic Complaints and Symptoms: No Complaints or Symptoms Cardiovascular Complaints and Symptoms: Review of System Notes: dysrhythmia heart valve problem hyperlipidemia Medical History: Positive for: Hypertension Gastrointestinal Complaints and Symptoms: No Complaints or Symptoms Endocrine Complaints and Symptoms: No Complaints or Symptoms Genitourinary Complaints and Symptoms: No Complaints or Symptoms Immunological Complaints and Symptoms: No Complaints or Symptoms Musculoskeletal Medical History: Positive for: Osteoarthritis Neurologic Leah Shah, LOUGHNANE. ([id number]) Complaints and Symptoms: No Complaints or Symptoms Oncologic Complaints and Symptoms: No Complaints or Symptoms HBO Extended History Items Eyes: Cataracts Immunizations Pneumococcal Vaccine: Received Pneumococcal Vaccination: Yes Family and Social History Cancer: No; Diabetes: No; Heart Disease: Yes - Siblings, Mother; Hereditary Spherocytosis: No; Hypertension: Yes - Siblings, Mother, Father; Kidney Disease: No; Lung Disease: Yes - Father,  Siblings; Seizures: No; Stroke: Yes - Maternal Grandparents; Thyroid Problems: Yes - Mother, Maternal Grandparents; Tuberculosis: No; Current some day smoker - a pack a day; Marital Status - Married; Alcohol Use: Never; Drug Use: No History; Caffeine Use: Daily; Financial Concerns: No; Food, Clothing or Shelter Needs: No; Support System Lacking: No; Transportation Concerns: No; Advanced Directives: No; Patient does not want information on Advanced Directives; Do not resuscitate: No; Living Will: No; Medical Power of Attorney: No Electronic Signature(s) Signed: 04/01/2016 5:55:37 PM By: Alric Quan Signed: 04/10/2016 8:10:29 AM By: Linton Ham MD Entered By: Alric Quan on 04/01/2016 15:57:21 Leah Shah ([id number]) -------------------------------------------------------------------------------- [place] Details Patient Name: Leah Shah Date of Service: 04/01/2016 Medical Record Patient Account Number: [id number] Number: Treating RN: Ahmed Prima [birth year] (60 y.o. Other Clinician: Date of Birth/Sex: Female) Treating ROBSON, MICHAEL Primary Care Physician/Extender:  Wyline Beady, SYED Physician: Suella [place] in Treatment: 0 Referring Physician: [facility], SYED Diagnosis Coding ICD-10 Codes Code Description L97.521 Non-pressure chronic ulcer of other part of left foot limited to breakdown of skin S90.852A Superficial foreign body, left foot, initial encounter Facility Procedures CPT4 Code: [id number] Description: 99214 - WOUND CARE VISIT-LEV 4 EST PT Modifier: Quantity: 1 Physician Procedures CPT4: Description Modifier Quantity Code [id number] WC PHYS LEVEL 3 o NEW PT 1 ICD-10 Description Diagnosis L97.521 Non-pressure chronic ulcer of other part of left foot limited to breakdown of skin Electronic Signature(s) Signed: 04/10/2016 8:10:29 AM By: Linton Ham MD Entered By: Linton Ham on 04/01/2016 17:48:34

## 2016-04-10 NOTE — Progress Notes (Signed)
JOLAN, SUNDT ([id number]) Visit Report for 04/01/2016 Allergy List Details Patient Name: Leah Shah, Leah Shah Date of Service: 04/01/2016 3:15 PM Medical Record Patient Account Number: [id number] Number: Treating RN: Ahmed Prima Feb 23, 1956 (60 y.o. Other Clinician: Date of Birth/Sex: Female) Treating ROBSON, MICHAEL Primary Care Physician: [facility], SYED Physician/Extender: G Referring Physician: [facility], SYED Weeks in Treatment: 0 Allergies Active Allergies codeine Allergy Notes Electronic Signature(s) Signed: 04/01/2016 5:55:37 PM By: Alric Quan Entered By: Alric Quan on 04/01/2016 15:47:23 Leah Shah ([id number]) -------------------------------------------------------------------------------- Arrival Information Details Patient Name: Leah Shah Date of Service: 04/01/2016 3:15 PM Medical Record Patient Account Number: [id number] Number: Treating RN: Ahmed Prima 08-04-1955 (60 y.o. Other Clinician: Date of Birth/Sex: Female) Treating ROBSON, MICHAEL Primary Care Physician: [facility], SYED Physician/Extender: G Referring Physician: Keith Rake Weeks in Treatment: 0 Visit Information Patient Arrived: Cane Arrival Time: 15:41 Accompanied By: sister Transfer Assistance: None Patient Identification Verified: Yes Secondary Verification Process Completed: Yes Patient Requires Transmission-Based No Precautions: Patient Has Alerts: No Electronic Signature(s) Signed: 04/01/2016 5:55:37 PM By: Alric Quan Entered By: Alric Quan on 04/01/2016 15:41:59 Leah Shah ([id number]) -------------------------------------------------------------------------------- Clinic Level of Care Assessment Details Patient Name: Leah Shah Date of Service: 04/01/2016 3:15 PM Medical Record Patient Account Number: [id number] Number: Treating RN: Ahmed Prima [birth year] (60 y.o. Other Clinician: Date of Birth/Sex: Female) Treating  ROBSON, MICHAEL Primary Care Physician: [facility], SYED Physician/Extender: G Referring Physician: Manuella Ghazi, SYED Weeks in Treatment: 0 Clinic Level of Care Assessment Items TOOL 2 Quantity Score X - Use when only an EandM is performed on the INITIAL visit 1 0 ASSESSMENTS - Nursing Assessment / Reassessment X - General Physical Exam (combine w/ comprehensive assessment (listed just 1 20 below) when performed on new pt. evals) X - Comprehensive Assessment (HX, ROS, Risk Assessments, Wounds Hx, etc.) 1 25 ASSESSMENTS - Wound and Skin Assessment / Reassessment X - Simple Wound Assessment / Reassessment - one wound 1 5 []  - Complex Wound Assessment / Reassessment - multiple wounds 0 []  - Dermatologic / Skin Assessment (not related to wound area) 0 ASSESSMENTS - Ostomy and/or Continence Assessment and Care []  - Incontinence Assessment and Management 0 []  - Ostomy Care Assessment and Management (repouching, etc.) 0 PROCESS - Coordination of Care X - Simple Patient / Family Education for ongoing care 1 15 []  - Complex (extensive) Patient / Family Education for ongoing care 0 X - Staff obtains Programmer, systems, Records, Test Results / Process Orders 1 10 []  - Staff telephones HHA, Nursing Homes / Clarify orders / etc 0 []  - Routine Transfer to another Facility (non-emergent condition) 0 []  - Routine Hospital Admission (non-emergent condition) 0 X - New Admissions / Biomedical engineer / Ordering NPWT, Apligraf, etc. 1 15 []  - Emergency Hospital Admission (emergent condition) 0 Leah Shah, MEEDER. ([id number]) X - Simple Discharge Coordination 1 10 []  - Complex (extensive) Discharge Coordination 0 PROCESS - Special Needs []  - Pediatric / Minor Patient Management 0 []  - Isolation Patient Management 0 []  - Hearing / Language / Visual special needs 0 []  - Assessment of Community assistance (transportation, D/C planning, etc.) 0 []  - Additional assistance / Altered mentation 0 []  - Support Surface(s)  Assessment (bed, cushion, seat, etc.) 0 INTERVENTIONS - Wound Cleansing / Measurement X - Wound Imaging (photographs - any number of wounds) 1 5 []  - Wound Tracing (instead of photographs) 0 X - Simple Wound Measurement - one wound 1 5 []  - Complex Wound Measurement - multiple wounds 0 X -  Simple Wound Cleansing - one wound 1 5 []  - Complex Wound Cleansing - multiple wounds 0 INTERVENTIONS - Wound Dressings X - Small Wound Dressing one or multiple wounds 1 10 []  - Medium Wound Dressing one or multiple wounds 0 []  - Large Wound Dressing one or multiple wounds 0 []  - Application of Medications - injection 0 INTERVENTIONS - Miscellaneous []  - External ear exam 0 []  - Specimen Collection (cultures, biopsies, blood, body fluids, etc.) 0 []  - Specimen(s) / Culture(s) sent or taken to Lab for analysis 0 []  - Patient Transfer (multiple staff / Harrel Lemon Lift / Similar devices) 0 []  - Simple Staple / Suture removal (25 or less) 0 Leah Shah, Leah W. ([id number]) []  - Complex Staple / Suture removal (26 or more) 0 []  - Hypo / Hyperglycemic Management (close monitor of Blood Glucose) 0 X - Ankle / Brachial Index (ABI) - do not check if billed separately 1 15 Has the patient been seen at the hospital within the last three years: Yes Total Score: 140 Level Of Care: New/Established - Level 4 Electronic Signature(s) Signed: 04/01/2016 5:55:37 PM By: Alric Quan Entered By: Alric Quan on 04/01/2016 17:25:50 Leah Shah ([id number]) -------------------------------------------------------------------------------- Encounter Discharge Information Details Patient Name: Leah Shah Date of Service: 04/01/2016 3:15 PM Medical Record Patient Account Number: [id number] Number: Treating RN: Ahmed Prima September 20, 1955 (60 y.o. Other Clinician: Date of Birth/Sex: Female) Treating ROBSON, MICHAEL Primary Care Physician: [facility], SYED Physician/Extender: G Referring Physician: Manuella Ghazi,  SYED Weeks in Treatment: 0 Encounter Discharge Information Items Discharge Pain Level: 0 Discharge Condition: Stable Ambulatory Status: Cane Discharge Destination: Home Transportation: Private Auto Accompanied By: sister Schedule Follow-up Appointment: No Medication Reconciliation completed No and provided to Patient/Care [place]: Provided on Clinical Summary of Care: 04/01/2016 Form Type Recipient Paper Patient DS Electronic Signature(s) Signed: 04/01/2016 4:51:49 PM By: Ruthine Dose Entered By: Ruthine Dose on 04/01/2016 16:51:49 Leah Shah ([id number]) -------------------------------------------------------------------------------- Lower Extremity Assessment Details Patient Name: Leah Shah Date of Service: 04/01/2016 3:15 PM Medical Record Patient Account Number: [id number] Number: Treating RN: Ahmed Prima January 10, 1956 (60 y.o. Other Clinician: Date of Birth/Sex: Female) Treating ROBSON, [place] Primary Care Physician: [facility], SYED Physician/Extender: G Referring Physician: [facility], SYED Weeks in Treatment: 0 Edema Assessment Assessed: [Left: No] [Right: No] Edema: [Left: Ye] [Right: s] Calf Left: Right: Point of Measurement: 33 cm From Medial Instep 46.6 cm cm Ankle Left: Right: Point of Measurement: 12 cm From Medial Instep 31.3 cm cm Vascular Assessment Pulses: Posterior Tibial Palpable: [Left:No] Doppler: [Left:Monophasic] Dorsalis Pedis Palpable: [Left:No] Doppler: [Left:Monophasic] Extremity colors, hair growth, and conditions: Extremity Color: [Left:Normal] Blood Pressure: Brachial: [Left:110] Dorsalis Pedis: 130 [Left:Dorsalis Pedis:] Ankle: Posterior Tibial: 130 [Left:Posterior Tibial: 1.18] Toe Nail Assessment Left: Right: Thick: No Discolored: No Deformed: No Improper Length and Hygiene: Yes Leah Shah, Leah Shah ([id number]) Electronic Signature(s) Signed: 04/01/2016 5:55:37 PM By: Alric Quan Entered By: Alric Quan on 04/01/2016 16:14:26 Leah Shah ([id number]) -------------------------------------------------------------------------------- Multi Wound Chart Details Patient Name: Leah Shah Date of Service: 04/01/2016 3:15 PM Medical Record Patient Account Number: [id number] Number: Treating RN: Ahmed Prima 1956-06-22 (60 y.o. Other Clinician: Date of Birth/Sex: Female) Treating ROBSON, MICHAEL Primary Care Physician: [facility], SYED Physician/Extender: G Referring Physician: [facility], SYED Weeks in Treatment: 0 Vital Signs Height(in): 68 Pulse(bpm): 71 Weight(lbs): 305 Blood Pressure 100/62 (mmHg): Body Mass Index(BMI): 46 Temperature(F): 98.1 Respiratory Rate 18 (breaths/min): Wound Assessments Treatment Notes Electronic Signature(s) Signed: 04/01/2016 5:55:37 PM By: Alric Quan Entered By: Alric Quan on  04/01/2016 16:35:59 Leah Shah, Leah Shah ([id number]) -------------------------------------------------------------------------------- Multi-Disciplinary Care Plan Details Patient Name: Leah Shah, Leah Shah Date of Service: 04/01/2016 3:15 PM Medical Record Patient Account Number: [id number] Number: Treating RN: Ahmed Prima [birth year] (60 y.o. Other Clinician: Date of Birth/Sex: Female) Treating ROBSON, [place] Primary Care Physician: [place]: G Referring Physician: Manuella Ghazi, SYED Weeks in Treatment: 0 Active Inactive Electronic Signature(s) Signed: 04/01/2016 5:55:37 PM By: Alric Quan Entered By: Alric Quan on 04/01/2016 16:35:53 Leah Shah ([id number]) -------------------------------------------------------------------------------- Non-Wound Condition Assessment Details Patient Name: Leah Shah Date of Service: 04/01/2016 3:15 PM Medical Record Patient Account Number: [id number] Number: Treating RN: Ahmed Prima Nov 29, 1955 (60 y.o. Other Clinician: Date of Birth/Sex: Female) Treating  ROBSON, [place] Primary Care Physician: [facility], SYED Physician/Extender: G Referring Physician: [facility], SYED Weeks in Treatment: 0 Non-Wound Condition: Condition: Other Dermatologic Condition Location: Other: left plantar great toe Side: Left Photos Periwound Skin Texture Texture Color No Abnormalities Noted: No No Abnormalities Noted: No Callus: Yes Moisture No Abnormalities Noted: No Dry / Scaly: Yes Electronic Signature(s) Signed: 04/01/2016 5:55:37 PM By: Alric Quan Entered By: Alric Quan on 04/01/2016 17:27:42 Leah Shah ([id number]) -------------------------------------------------------------------------------- Pain Assessment Details Patient Name: Leah Shah Date of Service: 04/01/2016 3:15 PM Medical Record Patient Account Number: [id number] Number: Treating RN: Ahmed Prima 1955-10-01 (60 y.o. Other Clinician: Date of Birth/Sex: Female) Treating ROBSON, MICHAEL Primary Care Physician: [facility], SYED Physician/Extender: G Referring Physician: Manuella Ghazi, SYED Weeks in Treatment: 0 Active Problems Location of Pain Severity and Description of Pain Patient Has Paino Yes Site Locations Pain Location: Pain in Ulcers With Dressing Change: Yes Duration of the Pain. Constant / Intermittento Constant Rate the pain. Current Pain Level: 5 Worst Pain Level: 10 Least Pain Level: 2 Character of Pain Describe the Pain: Aching, Burning Pain Management and Medication Current Pain Management: Electronic Signature(s) Signed: 04/01/2016 5:55:37 PM By: Alric Quan Entered By: Alric Quan on 04/01/2016 15:43:14 Leah Shah ([id number]) -------------------------------------------------------------------------------- Patient/Caregiver Education Details Patient Name: Leah Shah Date of Service: 04/01/2016 3:15 PM Medical Record Patient Account Number: [id number] Number: Treating RN: Ahmed Prima 1955/08/31 (60 y.o. Other  Clinician: Date of Birth/Gender: Female) Treating ROBSON, MICHAEL Primary Care Physician: [facility], SYED Physician/Extender: G Referring Physician: [facility] in Treatment: 0 Education Assessment Education Provided To: Patient Education Topics Provided Wound/Skin Impairment: Other: Keep pressure off of callused area for a week. Call our office if you have any questions Handouts: or concerns Methods: Demonstration, Explain/Verbal Responses: State content correctly Electronic Signature(s) Signed: 04/01/2016 5:55:37 PM By: Alric Quan Entered By: Alric Quan on 04/01/2016 16:37:36 Leah Shah ([id number]) -------------------------------------------------------------------------------- [place] Details Patient Name: Leah Shah Date of Service: 04/01/2016 3:15 PM Medical Record Patient Account Number: [id number] Number: Treating RN: Ahmed Prima 1956-01-23 (60 y.o. Other Clinician: Date of Birth/Sex: Female) Treating ROBSON, [place] Primary Care Physician: [facility], [place]: G Referring Physician: [facility], SYED Weeks in Treatment: 0 Vital Signs Time Taken: 15:43 Temperature (F): 98.1 Height (in): 68 Pulse (bpm): 71 Source: Stated Respiratory Rate (breaths/min): 18 Weight (lbs): 305 Blood Pressure (mmHg): 100/62 Source: Stated Reference Range: 80 - 120 mg / dl Body Mass Index (BMI): 46.4 Electronic Signature(s) Signed: 04/01/2016 5:55:37 PM By: Alric Quan Entered By: Alric Quan on 04/01/2016 15:46:24

## 2016-04-17 ENCOUNTER — Ambulatory Visit (INDEPENDENT_AMBULATORY_CARE_PROVIDER_SITE_OTHER): Payer: Commercial Managed Care - HMO | Admitting: Family Medicine

## 2016-04-17 DIAGNOSIS — M47896 Other spondylosis, lumbar region: Secondary | ICD-10-CM | POA: Diagnosis not present

## 2016-04-17 DIAGNOSIS — M545 Low back pain: Secondary | ICD-10-CM | POA: Diagnosis not present

## 2016-04-17 DIAGNOSIS — M791 Myalgia: Secondary | ICD-10-CM | POA: Diagnosis not present

## 2016-04-20 ENCOUNTER — Telehealth: Payer: Self-pay | Admitting: Family Medicine

## 2016-04-20 NOTE — Telephone Encounter (Signed)
Pt needs refill on Hydroxyzine. Pt ran out over the weekend and without she itches really bad. [facility] [address] [place]

## 2016-04-21 NOTE — Telephone Encounter (Signed)
Called and notified patient that she was given a #90 day supply (3 months) on 03/13/2016 too soon for refill of hydroxyzine 25 mg 1 tablet daily.

## 2016-04-29 ENCOUNTER — Ambulatory Visit
Admission: RE | Admit: 2016-04-29 | Discharge: 2016-04-29 | Disposition: A | Discharge status: Home / Self Care | Payer: Commercial Managed Care - HMO | Source: Ambulatory Visit | Attending: Family Medicine | Admitting: Family Medicine

## 2016-04-29 ENCOUNTER — Ambulatory Visit (INDEPENDENT_AMBULATORY_CARE_PROVIDER_SITE_OTHER): Payer: Commercial Managed Care - HMO | Admitting: Family Medicine

## 2016-04-29 ENCOUNTER — Encounter: Payer: Self-pay | Admitting: Family Medicine

## 2016-04-29 VITALS — BP 140/80 | HR 81 | Temp 98.0°F | Resp 18 | Ht 66.0 in | Wt 308.9 lb

## 2016-04-29 DIAGNOSIS — R0989 Other specified symptoms and signs involving the circulatory and respiratory systems: Secondary | ICD-10-CM | POA: Diagnosis not present

## 2016-04-29 DIAGNOSIS — Z872 Personal history of diseases of the skin and subcutaneous tissue: Secondary | ICD-10-CM | POA: Diagnosis not present

## 2016-04-29 DIAGNOSIS — F419 Anxiety disorder, unspecified: Secondary | ICD-10-CM | POA: Diagnosis not present

## 2016-04-29 DIAGNOSIS — M545 Low back pain: Secondary | ICD-10-CM | POA: Diagnosis not present

## 2016-04-29 DIAGNOSIS — R05 Cough: Secondary | ICD-10-CM | POA: Diagnosis not present

## 2016-04-29 DIAGNOSIS — M25462 Effusion, left knee: Secondary | ICD-10-CM | POA: Diagnosis not present

## 2016-04-29 DIAGNOSIS — G8929 Other chronic pain: Secondary | ICD-10-CM

## 2016-04-29 DIAGNOSIS — M25562 Pain in left knee: Secondary | ICD-10-CM | POA: Diagnosis not present

## 2016-04-29 DIAGNOSIS — M7989 Other specified soft tissue disorders: Secondary | ICD-10-CM

## 2016-04-29 DIAGNOSIS — I10 Essential (primary) hypertension: Secondary | ICD-10-CM

## 2016-04-29 DIAGNOSIS — R600 Localized edema: Secondary | ICD-10-CM | POA: Diagnosis not present

## 2016-04-29 LAB — TSH: TSH: 3.5 m[IU]/L

## 2016-04-29 MED ORDER — FUROSEMIDE 40 MG PO TABS: 40.0000 mg | ORAL_TABLET | Freq: Two times a day (BID) | ORAL | 0 refills | Status: DC

## 2016-04-29 MED ORDER — LOSARTAN POTASSIUM 50 MG PO TABS: ORAL_TABLET | ORAL | 1 refills | Status: DC

## 2016-04-29 MED ORDER — ALPRAZOLAM 2 MG PO TABS: 2.0000 mg | ORAL_TABLET | Freq: Two times a day (BID) | ORAL | 0 refills | Status: DC | PRN

## 2016-04-29 MED ORDER — OXYCODONE HCL 5 MG PO TABS: 5.0000 mg | ORAL_TABLET | Freq: Three times a day (TID) | ORAL | 0 refills | Status: DC | PRN

## 2016-04-29 MED ORDER — HYDROXYZINE HCL 25 MG PO TABS: 25.0000 mg | ORAL_TABLET | Freq: Every day | ORAL | 0 refills | Status: DC | PRN

## 2016-04-29 NOTE — Progress Notes (Signed)
Name: Leah Shah   MRN: [id number]    DOB: [birth year]   Date:04/29/2016       Progress Note  Subjective  Chief Complaint  Chief Complaint  Patient presents with  . Hypertension  . Pain  . Follow-up    medication refills    Anxiety  Presents for follow-up visit. The problem has been unchanged. Symptoms include excessive worry, insomnia, nervous/anxious behavior and panic. Patient reports no obsessions. Symptoms occur most days. The severity of symptoms is moderate and causing significant distress.   Her past medical history is significant for anxiety/panic attacks. Past treatments include benzodiazephines. Compliance with prior treatments has been good.  Back Pain  This is a chronic problem. The problem occurs daily. The problem is unchanged. The pain is present in the lumbar spine and sacro-iliac. The pain is at a severity of 6/10. The symptoms are aggravated by bending. Pertinent negatives include no bladder incontinence or bowel incontinence. She has tried analgesics for the symptoms.   Bilateral Leg Swelling:  Pt. Presents with complaints of worsening leg swelling, taking Furosemide 20 mg twice daily and feels like its not working, she has swelling in the morning and at night, even after elevating her feet. She is concerned that she may have Congestive Heart Failure and wants her thyroid levels checked and wishes to see a cardiologist. She has family history of CHF.    Past Medical History:  Diagnosis Date  . Anxiety   . Arthritis    R knee, L knee - OA  . Dysrhythmia    "mild arrythmia after taking Redux diet med years ago"no cardio fi  . Headache(784.0)   . Heart valve problem    was on Redux and it caused some "heart valve damage"-does not see cardiologist  . Hyperlipidemia   . Hypertension    echoJefm Bryant, wnl 2009? , had taken Redux for diet management  but now  off the market. pt, told that echo wnl.    . Recurrent upper respiratory infection (URI)    treated /w  OTC med.   . Shortness of breath    with exertion    Past Surgical History:  Procedure Laterality Date  . ABDOMINAL HYSTERECTOMY     partial-uterus removed  . CHOLECYSTECTOMY     2011  . COCCYX REMOVAL     [facility]  . EYE SURGERY     states both lens replaced. Not sure if cataract surgery or not.  [name] HEEL SPUR SURGERY    . I&D EXTREMITY  08/27/2011   Procedure: IRRIGATION AND DEBRIDEMENT EXTREMITY;  Surgeon: [name], MD;  Location: [facility];  Service: Orthopedics;  Laterality: Right;  . KNEE ARTHROPLASTY  07/28/2011   Procedure: COMPUTER ASSISTED TOTAL KNEE ARTHROPLASTY;  Surgeon: [name], MD;  Location: [place];  Service: Orthopedics;  Laterality: Right;  right total knee arthroplasty, computer assisted  . KNEE ARTHROSCOPY     right knee  . TMJ ARTHROPLASTY     [facility]- [facility]    Family History  Problem Relation Age of Onset  . Heart disease Mother   . Hypertension Mother   . Hypertension Father     Social History   Social History  . Marital status: Married    Spouse name: N/A  . Number of children: N/A  . Years of education: N/A   Occupational History  . Not on file.   Social History Main Topics  . Smoking status: Current Every Day Smoker  Packs/day: 1.00    Years: 50.00  . Smokeless tobacco: Never Used  . Alcohol use No  . Drug use: No  . Sexual activity: Not Currently   Other Topics Concern  . Not on file   Social History Narrative  . No narrative on file     Current Outpatient Prescriptions:  .  alprazolam (XANAX) 2 MG tablet, Take 1 tablet (2 mg total) by mouth 2 (two) times daily as needed for anxiety., Disp: 60 tablet, Rfl: 0 .  atenolol (TENORMIN) 100 MG tablet, TAKE 1 TABLET (100 MG TOTAL) BY MOUTH DAILY., Disp: 90 tablet, Rfl: 0 .  cyclobenzaprine (FLEXERIL) 5 MG tablet, TAKE ONE AND ONE-HALF TABLETS BY MOUTH EVERY NIGHT AT BEDTIME, Disp: 45 tablet, Rfl: 2 .  furosemide (LASIX) 40 MG tablet, Take 1 tablet (40 mg total) by mouth  2 (two) times daily. 40 mg in AM, 20 mg in PM, Disp: 60 tablet, Rfl: 0 .  hydrOXYzine (ATARAX/VISTARIL) 25 MG tablet, Take 1 tablet (25 mg total) by mouth daily as needed., Disp: 90 tablet, Rfl: 0 .  losartan (COZAAR) 50 MG tablet, TAKE 1 TABLET (50 MG TOTAL) BY MOUTH DAILY., Disp: 90 tablet, Rfl: 1 .  oxyCODONE (OXY IR/ROXICODONE) 5 MG immediate release tablet, Take 1 tablet (5 mg total) by mouth every 8 (eight) hours as needed for moderate pain., Disp: 90 tablet, Rfl: 0 .  simvastatin (ZOCOR) 40 MG tablet, TAKE 1 TABLET (40 MG TOTAL) BY MOUTH DAILY AT 6 PM., Disp: 90 tablet, Rfl: 0 .  traZODone (DESYREL) 150 MG tablet, Take 1 tablet by mouth at bedtime., Disp: , Rfl:   Allergies  Allergen Reactions  . Bacitracin-Neomycin-Polymyxin Other (See Comments)  . Codeine Other (See Comments)    Itching "she can take some forms of it" Itching "she can take some forms of it"  . Gabapentin Nausea Only  . Neosporin  [Neomycin-Bacitracin Zn-Polymyx]   . Pregabalin Other (See Comments)    stomach issues stomach issues     Review of Systems  Gastrointestinal: Negative for bowel incontinence.  Genitourinary: Negative for bladder incontinence.  Musculoskeletal: Positive for back pain.  Psychiatric/Behavioral: The patient is nervous/anxious and has insomnia.      Objective  Vitals:   04/29/16 1432  BP: 140/80  Pulse: 81  Resp: 18  Temp: 98 F (36.7 C)  TempSrc: Oral  SpO2: 94%  Weight: (!) 308 lb 14.4 oz (140.1 kg)  Height: 5\' 6"  (1.676 m)    Physical Exam  Constitutional: She is well-developed, well-nourished, and in no distress.  Cardiovascular: Normal rate, regular rhythm, S1 normal, S2 normal and normal heart sounds.   No murmur heard. Pulmonary/Chest: Effort normal and breath sounds normal. She has no wheezes.  Musculoskeletal:       Right ankle: She exhibits swelling. Tenderness.       Left ankle: She exhibits swelling. Tenderness.       Lumbar back: She exhibits tenderness  and pain. She exhibits no spasm.       Back:  3+ pitting edema bilateral lower extremities.  Psychiatric: Mood, memory, affect and judgment normal.  Nursing note and vitals reviewed.    Assessment & Plan  1. Anxiety Stable and responsive to alprazolam taken twice daily as needed. Patient compliant with controlled substances agreement. Refills provided - alprazolam (XANAX) 2 MG tablet; Take 1 tablet (2 mg total) by mouth 2 (two) times daily as needed for anxiety.  Dispense: 60 tablet; Refill: 0  2. Leg swelling  Not responsive to a semi-taken 20 mg twice a day, we'll increase to 40 mg in a.m., obtain TSH and pro BNP. Referral to cardiology for echocardiogram  - furosemide (LASIX) 40 MG tablet; Take 1 tablet (40 mg total) by mouth 2 (two) times daily. 40 mg in AM, 20 mg in PM  Dispense: 60 tablet; Refill: 0 - TSH - Ambulatory referral to Cardiology - Brain natriuretic peptide  3. Chronic midline low back pain without sciatica Stable, responsive to opioid therapy. Patient compliant with controlled substances agreement. Refills provided - oxyCODONE (OXY IR/ROXICODONE) 5 MG immediate release tablet; Take 1 tablet (5 mg total) by mouth every 8 (eight) hours as needed for moderate pain.  Dispense: 90 tablet; Refill: 0  4. H/O skin pruritus  - hydrOXYzine (ATARAX/VISTARIL) 25 MG tablet; Take 1 tablet (25 mg total) by mouth daily as needed.  Dispense: 90 tablet; Refill: 0  5. Benign hypertension  - losartan (COZAAR) 50 MG tablet; TAKE 1 TABLET (50 MG TOTAL) BY MOUTH DAILY.  Dispense: 90 tablet; Refill: 1  6. Respiratory crackles at right lung base  - DG Chest 2 View; Future   Cage Gupton Asad A[facility] 04/29/2016 6:04 PM

## 2016-04-30 ENCOUNTER — Telehealth: Payer: Self-pay

## 2016-04-30 DIAGNOSIS — Z872 Personal history of diseases of the skin and subcutaneous tissue: Secondary | ICD-10-CM

## 2016-04-30 LAB — BRAIN NATRIURETIC PEPTIDE: BRAIN NATRIURETIC PEPTIDE: 156.9 pg/mL — AB (ref <100)

## 2016-04-30 MED ORDER — HYDROXYZINE HCL 25 MG PO TABS: 25.0000 mg | ORAL_TABLET | Freq: Two times a day (BID) | ORAL | 0 refills | Status: DC

## 2016-04-30 NOTE — Telephone Encounter (Signed)
Per Dr. Manuella Ghazi medication dosage has been changed to 25 mg twice daily patient has been notified and verbalized understanding, medication has been sent to [facility]

## 2016-06-01 ENCOUNTER — Ambulatory Visit (INDEPENDENT_AMBULATORY_CARE_PROVIDER_SITE_OTHER): Payer: Commercial Managed Care - HMO | Admitting: Family Medicine

## 2016-06-01 ENCOUNTER — Encounter: Payer: Self-pay | Admitting: Family Medicine

## 2016-06-01 VITALS — BP 138/81 | HR 75 | Temp 98.0°F | Resp 16 | Ht 66.0 in | Wt 307.9 lb

## 2016-06-01 DIAGNOSIS — Z23 Encounter for immunization: Secondary | ICD-10-CM

## 2016-06-01 DIAGNOSIS — M25475 Effusion, left foot: Secondary | ICD-10-CM

## 2016-06-01 DIAGNOSIS — M25473 Effusion, unspecified ankle: Secondary | ICD-10-CM

## 2016-06-01 DIAGNOSIS — G8929 Other chronic pain: Secondary | ICD-10-CM | POA: Diagnosis not present

## 2016-06-01 DIAGNOSIS — M545 Low back pain, unspecified: Secondary | ICD-10-CM

## 2016-06-01 DIAGNOSIS — F419 Anxiety disorder, unspecified: Secondary | ICD-10-CM | POA: Diagnosis not present

## 2016-06-01 DIAGNOSIS — M25476 Effusion, unspecified foot: Secondary | ICD-10-CM

## 2016-06-01 MED ORDER — OXYCODONE HCL 5 MG PO TABS: 5.0000 mg | ORAL_TABLET | Freq: Three times a day (TID) | ORAL | 0 refills | Status: DC | PRN

## 2016-06-01 MED ORDER — ALPRAZOLAM 2 MG PO TABS: 2.0000 mg | ORAL_TABLET | Freq: Two times a day (BID) | ORAL | 0 refills | Status: DC | PRN

## 2016-06-01 NOTE — Progress Notes (Signed)
Name: Leah Shah   MRN: [id number]    DOB: [birth year]   Date:06/01/2016       Progress Note  Subjective  Chief Complaint  Chief Complaint  Patient presents with  . Follow-up    1 mo  . Medication Refill    oxycodone / xanax    Anxiety  Presents for follow-up visit. The problem has been unchanged. Symptoms include excessive worry, insomnia, nervous/anxious behavior and panic. Patient reports no obsessions. Symptoms occur most days. The severity of symptoms is moderate and causing significant distress.   Her past medical history is significant for anxiety/panic attacks. Past treatments include benzodiazephines. Compliance with prior treatments has been good.  Back Pain  This is a chronic problem. The problem occurs daily. The problem is unchanged. The pain is present in the lumbar spine and sacro-iliac. The pain is at a severity of 7/10. The symptoms are aggravated by bending. Pertinent negatives include no bladder incontinence or bowel incontinence. She has tried analgesics for the symptoms.      Past Medical History:  Diagnosis Date  . Anxiety   . Arthritis    R knee, L knee - OA  . Dysrhythmia    "mild arrythmia after taking Redux diet med years ago"no cardio fi  . Headache(784.0)   . Heart valve problem    was on Redux and it caused some "heart valve damage"-does not see cardiologist  . Hyperlipidemia   . Hypertension    echoJefm Bryant, wnl 2009? , had taken Redux for diet management  but now  off the market. pt, told that echo wnl.    . Recurrent upper respiratory infection (URI)    treated /w OTC med.   . Shortness of breath    with exertion    Past Surgical History:  Procedure Laterality Date  . ABDOMINAL HYSTERECTOMY     partial-uterus removed  . CHOLECYSTECTOMY     2011  . [facility] REMOVAL     [facility]  . EYE SURGERY     states both lens replaced. Not sure if cataract surgery or not.  [name] HEEL SPUR SURGERY    . I&D EXTREMITY  08/27/2011   Procedure:  IRRIGATION AND DEBRIDEMENT EXTREMITY;  Surgeon: [name], MD;  Location: [facility];  Service: Orthopedics;  Laterality: Right;  . KNEE ARTHROPLASTY  07/28/2011   Procedure: COMPUTER ASSISTED TOTAL KNEE ARTHROPLASTY;  Surgeon: [name], MD;  Location: [facility];  Service: Orthopedics;  Laterality: Right;  right total knee arthroplasty, computer assisted  . KNEE ARTHROSCOPY     right knee  . TMJ ARTHROPLASTY     [facility]- 1997    Family History  Problem Relation Age of Onset  . Heart disease Mother   . Hypertension Mother   . Hypertension Father     Social History   Social History  . Marital status: Married    Spouse name: N/A  . Number of children: N/A  . Years of education: N/A   Occupational History  . Not on file.   Social History Main Topics  . Smoking status: Current Every Day Smoker    Packs/day: 1.00    Years: 50.00  . Smokeless tobacco: Never Used  . Alcohol use No  . Drug use: No  . Sexual activity: Not Currently   Other Topics Concern  . Not on file   Social History Narrative  . No narrative on file     Current Outpatient Prescriptions:  .  alprazolam (  XANAX) 2 MG tablet, Take 1 tablet (2 mg total) by mouth 2 (two) times daily as needed for anxiety., Disp: 60 tablet, Rfl: 0 .  atenolol (TENORMIN) 100 MG tablet, TAKE 1 TABLET (100 MG TOTAL) BY MOUTH DAILY., Disp: 90 tablet, Rfl: 0 .  cyclobenzaprine (FLEXERIL) 5 MG tablet, TAKE ONE AND ONE-HALF TABLETS BY MOUTH EVERY NIGHT AT BEDTIME, Disp: 45 tablet, Rfl: 2 .  furosemide (LASIX) 40 MG tablet, Take 1 tablet (40 mg total) by mouth 2 (two) times daily. 40 mg in AM, 20 mg in PM, Disp: 60 tablet, Rfl: 0 .  hydrOXYzine (ATARAX/VISTARIL) 25 MG tablet, Take 1 tablet (25 mg total) by mouth 2 (two) times daily., Disp: 90 tablet, Rfl: 0 .  losartan (COZAAR) 50 MG tablet, TAKE 1 TABLET (50 MG TOTAL) BY MOUTH DAILY., Disp: 90 tablet, Rfl: 1 .  oxyCODONE (OXY IR/ROXICODONE) 5 MG immediate release tablet, Take 1  tablet (5 mg total) by mouth every 8 (eight) hours as needed for moderate pain., Disp: 90 tablet, Rfl: 0 .  simvastatin (ZOCOR) 40 MG tablet, TAKE 1 TABLET (40 MG TOTAL) BY MOUTH DAILY AT 6 PM., Disp: 90 tablet, Rfl: 0 .  traZODone (DESYREL) 150 MG tablet, Take 1 tablet by mouth at bedtime., Disp: , Rfl:   Allergies  Allergen Reactions  . Bacitracin-Neomycin-Polymyxin Other (See Comments)  . Codeine Other (See Comments)    Itching "she can take some forms of it" Itching "she can take some forms of it"  . Gabapentin Nausea Only  . Neosporin  [Neomycin-Bacitracin Zn-Polymyx]   . Pregabalin Other (See Comments)    stomach issues stomach issues     Review of Systems  Gastrointestinal: Negative for bowel incontinence.  Genitourinary: Negative for bladder incontinence.  Musculoskeletal: Positive for back pain.  Psychiatric/Behavioral: The patient is nervous/anxious and has insomnia.      Objective  Vitals:   06/01/16 1450  BP: 138/81  Pulse: 75  Resp: 16  Temp: 98 F (36.7 C)  TempSrc: Oral  SpO2: 96%  Weight: (!) 307 lb 14.4 oz (139.7 kg)  Height: 5\' 6"  (1.676 m)    Physical Exam  Constitutional: She is oriented to person, place, and time and well-developed, well-nourished, and in no distress.  Cardiovascular: Normal rate, regular rhythm, S1 normal, S2 normal and normal heart sounds.   No murmur heard. Pulmonary/Chest: Effort normal and breath sounds normal. She has no wheezes.  Musculoskeletal:       Right ankle: She exhibits swelling.       Left ankle: She exhibits swelling.       Lumbar back: She exhibits tenderness, pain and spasm.       Back:  3+ pitting edema bilateral lower extremities.  Neurological: She is alert and oriented to person, place, and time.  Psychiatric: Mood, memory, affect and judgment normal.  Nursing note and vitals reviewed.      Assessment & Plan  1. Anxiety Stable, responsive to alprazolam. Refills provided and follow-up in one  month - alprazolam (XANAX) 2 MG tablet; Take 1 tablet (2 mg total) by mouth 2 (two) times daily as needed for anxiety.  Dispense: 60 tablet; Refill: 0  2. Chronic midline low back pain without sciatica Responsive to oxycodone 5 mg taken up to 3 times daily when needed. Refills provided - oxyCODONE (OXY IR/ROXICODONE) 5 MG immediate release tablet; Take 1 tablet (5 mg total) by mouth every 8 (eight) hours as needed for moderate pain.  Dispense: 90 tablet; Refill:  0  3. Bilateral swelling of feet and ankles Patient has been referred to cardiology, we'll contact to schedule an earlier appointment.  4. Need for influenza vaccination  - Flu Vaccine QUAD 36+ mos PF IM (Fluarix & Fluzone Quad PF)  Bodhi Stenglein Asad A. [facility] 06/01/2016 3:02 PM

## 2016-06-04 ENCOUNTER — Ambulatory Visit (INDEPENDENT_AMBULATORY_CARE_PROVIDER_SITE_OTHER): Payer: Commercial Managed Care - HMO | Admitting: Cardiology

## 2016-06-04 ENCOUNTER — Encounter: Payer: Self-pay | Admitting: Cardiology

## 2016-06-04 VITALS — BP 109/59 | HR 63 | Ht 68.0 in | Wt 310.0 lb

## 2016-06-04 DIAGNOSIS — R0602 Shortness of breath: Secondary | ICD-10-CM

## 2016-06-04 DIAGNOSIS — M7989 Other specified soft tissue disorders: Secondary | ICD-10-CM | POA: Diagnosis not present

## 2016-06-04 DIAGNOSIS — E6609 Other obesity due to excess calories: Secondary | ICD-10-CM

## 2016-06-04 DIAGNOSIS — E784 Other hyperlipidemia: Secondary | ICD-10-CM | POA: Diagnosis not present

## 2016-06-04 DIAGNOSIS — E7849 Other hyperlipidemia: Secondary | ICD-10-CM

## 2016-06-04 DIAGNOSIS — I10 Essential (primary) hypertension: Secondary | ICD-10-CM

## 2016-06-04 DIAGNOSIS — IMO0001 Reserved for inherently not codable concepts without codable children: Secondary | ICD-10-CM

## 2016-06-04 DIAGNOSIS — F17200 Nicotine dependence, unspecified, uncomplicated: Secondary | ICD-10-CM

## 2016-06-04 DIAGNOSIS — Z6841 Body Mass Index (BMI) 40.0 and over, adult: Secondary | ICD-10-CM

## 2016-06-04 NOTE — Patient Instructions (Addendum)
Testing/Procedures: Your physician has requested that you have an echocardiogram. Echocardiography is a painless test that uses sound waves to create images of your heart. It provides your doctor with information about the size and shape of your heart and how well your heart's chambers and valves are working. This procedure takes approximately one hour. There are no restrictions for this procedure.  [place]  Your caregiver has ordered a Stress Test with nuclear imaging. The purpose of this test is to evaluate the blood supply to your heart muscle. This procedure is referred to as a "Non-Invasive Stress Test." This is because other than having an IV started in your vein, nothing is inserted or "invades" your body. Cardiac stress tests are done to find areas of poor blood flow to the heart by determining the extent of coronary artery disease (CAD). Some patients exercise on a treadmill, which naturally increases the blood flow to your heart, while others who are  unable to walk on a treadmill due to physical limitations have a pharmacologic/chemical stress agent called Lexiscan . This medicine will mimic walking on a treadmill by temporarily increasing your coronary blood flow.   Please note: these test may take anywhere between 2-4 hours to complete  PLEASE REPORT TO [place] AT THE FIRST DESK WILL DIRECT YOU WHERE TO GO  Date of Procedure:_Thursday June 11, 2016 and Friday June 12, 2016 at 08:00AM_  Arrival Time for Procedure:_Arrive at 07:45AM to register_  Instructions regarding medication:   __X__ : Hold diabetes medication morning of procedure  __X__:  Hold Atenolol (Tenormin) the morning of procedure  __X__:  Hold Furosemide (Lasix) the morning of procedure  PLEASE NOTIFY THE OFFICE AT LEAST 24 HOURS IN ADVANCE IF YOU ARE UNABLE TO KEEP YOUR APPOINTMENT.  [phone] AND  PLEASE NOTIFY NUCLEAR MEDICINE AT [facility] AT LEAST 24 HOURS IN ADVANCE  IF YOU ARE UNABLE TO KEEP YOUR APPOINTMENT. [phone]  How to prepare for your Myoview test:  1. Do not eat or drink after midnight 2. No caffeine for 24 hours prior to test 3. No smoking 24 hours prior to test. 4. Your medication may be taken with water.  If your doctor stopped a medication because of this test, do not take that medication. 5. Ladies, please do not wear dresses.  Skirts or pants are appropriate. Please wear a short sleeve shirt. 6. No perfume, cologne or lotion. 7. Wear comfortable walking shoes. No heels!   Follow-Up: Your physician recommends that you schedule a follow-up appointment after testing with Dr. Yvone Neu.   It was a pleasure seeing you today here in the office. Please do not hesitate to give [place] a call back if you have any further questions. [place], BSN   Echocardiogram An echocardiogram, or echocardiography, uses sound waves (ultrasound) to produce an image of your heart. The echocardiogram is simple, painless, obtained within a short period of time, and offers valuable information to your health care provider. The images from an echocardiogram can provide information such as:  Evidence of coronary artery disease (CAD).  Heart size.  Heart muscle function.  Heart valve function.  Aneurysm detection.  Evidence of a past heart attack.  Fluid buildup around the heart.  Heart muscle thickening.  Assess heart valve function. Tell a health care provider about:  Any allergies you have.  All medicines you are taking, including vitamins, herbs, eye drops, creams, and over-the-counter medicines.  Any problems you or family  members have had with anesthetic medicines.  Any blood disorders you have.  Any surgeries you have had.  Any medical conditions you have.  Whether you are pregnant or may be pregnant. What happens before the procedure? No special preparation is needed. Eat and drink normally. What happens during the  procedure?  In order to produce an image of your heart, gel will be applied to your chest and a wand-like tool (transducer) will be moved over your chest. The gel will help transmit the sound waves from the transducer. The sound waves will harmlessly bounce off your heart to allow the heart images to be captured in real-time motion. These images will then be recorded.  You may need an IV to receive a medicine that improves the quality of the pictures. What happens after the procedure? You may return to your normal schedule including diet, activities, and medicines, unless your health care provider tells you otherwise. This information is not intended to replace advice given to you by your health care provider. Make sure you discuss any questions you have with your health care provider. Document Released: 06/19/2000 Document Revised: 02/08/2016 Document Reviewed: 02/27/2013 Elsevier Interactive Patient Education  2017 [place]. Pharmacologic Stress Electrocardiogram A pharmacologic stress electrocardiogram is a heart (cardiac) test that uses nuclear imaging to evaluate the blood supply to your heart. This test may also be called a pharmacologic stress electrocardiography. Pharmacologic means that a medicine is used to increase your heart rate and blood pressure.  This stress test is done to find areas of poor blood flow to the heart by determining the extent of coronary artery disease (CAD). Some people exercise on a treadmill, which naturally increases the blood flow to the heart. For those people unable to exercise on a treadmill, a medicine is used. This medicine stimulates your heart and will cause your heart to beat harder and more quickly, as if you were exercising.  Pharmacologic stress tests can help determine:  The adequacy of blood flow to your heart during increased levels of activity in order to clear you for discharge home.  The extent of coronary artery blockage caused by  CAD.  Your prognosis if you have suffered a heart attack.  The effectiveness of cardiac procedures done, such as an angioplasty, which can increase the circulation in your coronary arteries.  Causes of chest pain or pressure. LET [facility] CARE PROVIDER KNOW ABOUT:  Any allergies you have.  All medicines you are taking, including vitamins, herbs, eye drops, creams, and over-the-counter medicines.  Previous problems you or members of your family have had with the use of anesthetics.  Any blood disorders you have.  Previous surgeries you have had.  Medical conditions you have.  Possibility of pregnancy, if this applies.  If you are currently breastfeeding. RISKS AND COMPLICATIONS Generally, this is a safe procedure. However, as with any procedure, complications can occur. Possible complications include:  You develop pain or pressure in the following areas:  Chest.  Jaw or neck.  Between your shoulder blades.  Radiating down your left arm.  Headache.  Dizziness or light-headedness.  Shortness of breath.  Increased or irregular heartbeat.  Low blood pressure.  Nausea or vomiting.  Flushing.  Redness going up the arm and slight pain during injection of medicine.  Heart attack (rare). BEFORE THE PROCEDURE   Avoid all forms of caffeine for 24 hours before your test or as directed by your health care provider. This includes coffee, tea (even decaffeinated tea), caffeinated  sodas, chocolate, cocoa, and certain pain medicines.  Follow your health care provider's instructions regarding eating and drinking before the test.  Take your medicines as directed at regular times with water unless instructed otherwise. Exceptions may include:  If you have diabetes, ask how you are to take your insulin or pills. It is common to adjust insulin dosing the morning of the test.  If you are taking beta-blocker medicines, it is important to talk to your health care provider  about these medicines well before the date of your test. Taking beta-blocker medicines may interfere with the test. In some cases, these medicines need to be changed or stopped 24 hours or more before the test.  If you wear a nitroglycerin patch, it may need to be removed prior to the test. Ask your health care provider if the patch should be removed before the test.  If you use an inhaler for any breathing condition, bring it with you to the test.  If you are an outpatient, bring a snack so you can eat right after the stress phase of the test.  Do not smoke for 4 hours prior to the test or as directed by your health care provider.  Do not apply lotions, powders, creams, or oils on your chest prior to the test.  Wear comfortable shoes and clothing. Let your health care provider know if you were unable to complete or follow the preparations for your test. PROCEDURE   Multiple patches (electrodes) will be put on your chest. If needed, small areas of your chest may be shaved to get better contact with the electrodes. Once the electrodes are attached to your body, multiple wires will be attached to the electrodes, and your heart rate will be monitored.  An IV access will be started. A nuclear trace (isotope) is given. The isotope may be given intravenously, or it may be swallowed. Nuclear refers to several types of radioactive isotopes, and the nuclear isotope lights up the arteries so that the nuclear images are clear. The isotope is absorbed by your body. This results in low radiation exposure.  A resting nuclear image is taken to show how your heart functions at rest.  A medicine is given through the IV access.  A second scan is done about 1 hour after the medicine injection and determines how your heart functions under stress.  During this stress phase, you will be connected to an electrocardiogram machine. Your blood pressure and oxygen levels will be monitored. AFTER THE PROCEDURE    Your heart rate and blood pressure will be monitored after the test.  You may return to your normal schedule, including diet,activities, and medicines, unless your health care provider tells you otherwise. This information is not intended to replace advice given to you by your health care provider. Make sure you discuss any questions you have with your health care provider. Document Released: 11/08/2008 Document Revised: 06/27/2013 Document Reviewed: 02/27/2013 Elsevier Interactive Patient Education  2017 [employer].

## 2016-06-04 NOTE — Progress Notes (Signed)
Cardiology Office Note   Date:  06/04/2016   ID:  [name], [name] [birth year], MRN [id number]  Referring Doctor:  Keith Rake, MD   Cardiologist:   [name], MD   Reason for consultation:  Chief Complaint  Patient presents with  . other    Ref by Dr. Manuella Ghazi for shortness of breath, LE edema. Meds reviewed by the pt.'s med list. Pt. c/o LE edema and shortness of breath      History of Present Illness: Leah Shah is a 59 y.o. female who presents for Leg swelling, shortness of breath. Patient is rather a poor historian. Probably, the symptoms have been going on for about a year or so. Swelling and shortness of breath has progressed over time. Shortness of breath is now at 10 feet of walking. Moderate in intensity, lasting a few minutes or so. Mostly with exertion.  Patient also mentions that episode of chest pain one week ago. She was resting and noted that jabbing sensation in the center of the chest, 3 out of 10 severity lasting 10 minutes at a time. No radiation. Resolved spontaneously. No recurrence since then.  Patient also reports weight gain of around 100 pounds in the last 2 years. She's attributes this to stress.  ROS:  Please see the history of present illness. Aside from mentioned under HPI, all other systems are reviewed and negative.     Past Medical History:  Diagnosis Date  . Anxiety   . Arthritis    R knee, L knee - OA  . Dysrhythmia    "mild arrythmia after taking Redux diet med years ago"no cardio fi  . Headache(784.0)   . Heart valve problem    was on Redux and it caused some "heart valve damage"-does not see cardiologist  . Hyperlipidemia   . Hypertension    echoJefm Bryant, wnl 2009? , had taken Redux for diet management  but now  off the market. pt, told that echo wnl.    . Recurrent upper respiratory infection (URI)    treated /w OTC med.   . Shortness of breath    with exertion    Past Surgical History:  Procedure Laterality Date  .  ABDOMINAL HYSTERECTOMY     partial-uterus removed  . CHOLECYSTECTOMY     2011  . COCCYX REMOVAL     [facility]  . EYE SURGERY     states both lens replaced. Not sure if cataract surgery or not.  [name] HEEL SPUR SURGERY    . I&D EXTREMITY  08/27/2011   Procedure: IRRIGATION AND DEBRIDEMENT EXTREMITY;  Surgeon: [name], MD;  Location: [facility];  Service: Orthopedics;  Laterality: Right;  . KNEE ARTHROPLASTY  07/28/2011   Procedure: COMPUTER ASSISTED TOTAL KNEE ARTHROPLASTY;  Surgeon: [name], MD;  Location: [facility];  Service: Orthopedics;  Laterality: Right;  right total knee arthroplasty, computer assisted  . KNEE ARTHROSCOPY     right knee  . TMJ ARTHROPLASTY     [place]     reports that she has been smoking.  She has a 25.00 pack-year smoking history. She has never used smokeless tobacco. She reports that she does not drink alcohol or use drugs.   family history includes Heart attack in her sister; Heart attack (age of onset: 37) in her maternal uncle; Heart disease in her maternal uncle and mother; Heart disease (age of onset: 31) in her sister; Hyperlipidemia in her sister; Hypertension in her father,  mother, and sister.   Outpatient Medications Prior to Visit  Medication Sig Dispense Refill  . alprazolam (XANAX) 2 MG tablet Take 1 tablet (2 mg total) by mouth 2 (two) times daily as needed for anxiety. 60 tablet 0  . atenolol (TENORMIN) 100 MG tablet TAKE 1 TABLET (100 MG TOTAL) BY MOUTH DAILY. 90 tablet 0  . cyclobenzaprine (FLEXERIL) 5 MG tablet TAKE ONE AND ONE-HALF TABLETS BY MOUTH EVERY NIGHT AT BEDTIME 45 tablet 2  . furosemide (LASIX) 40 MG tablet Take 1 tablet (40 mg total) by mouth 2 (two) times daily. 40 mg in AM, 20 mg in PM 60 tablet 0  . hydrOXYzine (ATARAX/VISTARIL) 25 MG tablet Take 1 tablet (25 mg total) by mouth 2 (two) times daily. 90 tablet 0  . losartan (COZAAR) 50 MG tablet TAKE 1 TABLET (50 MG TOTAL) BY MOUTH DAILY. 90 tablet 1  . oxyCODONE (OXY  IR/ROXICODONE) 5 MG immediate release tablet Take 1 tablet (5 mg total) by mouth every 8 (eight) hours as needed for moderate pain. 90 tablet 0  . simvastatin (ZOCOR) 40 MG tablet TAKE 1 TABLET (40 MG TOTAL) BY MOUTH DAILY AT 6 PM. 90 tablet 0  . traZODone (DESYREL) 150 MG tablet Take 1 tablet by mouth at bedtime.     No facility-administered medications prior to visit.      Allergies: Bacitracin-neomycin-polymyxin; Codeine; Gabapentin; Neosporin  [neomycin-bacitracin zn-polymyx]; and Pregabalin    PHYSICAL EXAM: VS:  BP (!) 109/59 (BP Location: Right Arm, Patient Position: Sitting, Cuff Size: Large)   Pulse 63   Ht 5\' 8"  (1.727 m)   Wt (!) 310 lb (140.6 kg)   SpO2 92%   BMI 47.14 kg/m  , Body mass index is 47.14 kg/m. Wt Readings from Last 3 Encounters:  06/04/16 (!) 310 lb (140.6 kg)  06/01/16 (!) 307 lb 14.4 oz (139.7 kg)  04/29/16 (!) 308 lb 14.4 oz (140.1 kg)    Orthostatic VS for the past 24 hrs:  BP- Lying Pulse- Lying BP- Sitting Pulse- Sitting BP- Standing at 0 minutes Pulse- Standing at 0 minutes  06/04/16 0910 116/66 61 108/43 66 99/56 68      GENERAL:  well developed, well nourished, Morbidly obese, not in acute distress HEENT: normocephalic, pink conjunctivae, anicteric sclerae, no xanthelasma, normal dentition, oropharynx clear NECK:  no neck vein engorgement, JVP normal, no hepatojugular reflux, carotid upstroke brisk and symmetric, no bruit, no thyromegaly, no lymphadenopathy LUNGS:  good respiratory effort, clear to auscultation bilaterally CV:  PMI not displaced, no thrills, no lifts, S1 and S2 within normal limits, no palpable S3 or S4, no murmurs, no rubs, no gallops ABD:  Soft, nontender, nondistended, normoactive bowel sounds, no abdominal aortic bruit, no hepatomegaly, no splenomegaly MS: nontender back, no kyphosis, no scoliosis, no joint deformities EXT:  2+ DP/PT pulses, +1 edema, nonpitting edema on dorsum of feet, no varicosities, no cyanosis, no  clubbing SKIN: warm, nondiaphoretic, normal turgor, no ulcers NEUROPSYCH: alert, oriented to person, place, and time, sensory/motor grossly intact, normal mood, appropriate affect  Recent Labs: 02/24/2016: ALT 35; BUN 12; Creat 0.72; Hemoglobin 12.4; Platelets 116; Potassium 4.3; Sodium 142 04/29/2016: Brain Natriuretic Peptide 156.9; TSH 3.50   Lipid Panel    Component Value Date/Time   CHOL 86 (L) 12/12/2015 1438   TRIG 95 12/12/2015 1438   HDL 24 (L) 12/12/2015 1438   CHOLHDL 3.6 12/12/2015 1438   LDLCALC 43 12/12/2015 1438     Other studies Reviewed:  EKG:  The ekg from11/30/2017 was personally reviewed by me and it revealed sinus rhythm, 63 BPM. QT recalculated at 440 ms, QTC 445 ms, within normal limits.  Additional studies/ records that were reviewed personally reviewed by me today include: None available   ASSESSMENT AND PLAN: Leg swelling There is minimal pitting edema noted, most of it is nonpitting especially the dorsum of the feet This is likely multifactorial: Fluid retention, lymphedema, obesity causing increased abdominal pressure and impeding venous and lymph flow Evidence of orthostasis on vital signs today. Will not increase dose of Lasix for now.  Recommend echocardiogram We'll defer to PCP workup of some abnormalities and LFTs.  Shortness of breath Likely again multifactorial. Patient has gained almost 100 pounds in the last 2 years due to dietary indiscretion. Patient continues to smoke. We will need to rule out CAD as she has multiple risk factors for CAD including family history of premature CAD, hypertension, hyperlipidemia, smoking history, morbid obesity Recommend to day pharmacologic nuclear stress test.  HTN BP is well controlled. Continue monitoring BP. Continue current medical therapy and lifestyle changes.  Hyperlipidemia Patient on statin therapy through PCP.  Morbid obesity Body mass index is 47.14 kg/m.[name] Recommend aggressive weight loss  through diet and increased physical activity.   Tobacco abuse We discussed the importance of smoking cessation and different strategies for quitting.   Current medicines are reviewed at length with the patient today.  The patient does not have concerns regarding medicines.  Labs/ tests ordered today include:  Orders Placed This Encounter  Procedures  . NM Myocar Multi W/Spect W/Wall Motion / EF  . EKG 12-Lead  . ECHOCARDIOGRAM COMPLETE    I had a lengthy and detailed discussion with the patient regarding diagnoses, prognosis, diagnostic options, treatment options , and side effects of medications.   I counseled the patient on importance of lifestyle modification including heart healthy diet, regular physical activity , and smoking cessation. We spent a great deal of time talking about how different factors are playing a role in her symptoms of leg swelling and shortness of breath. We talked about the importance of weight control, lifestyle changes, dietary restriction, weight loss.   Disposition:   FU with undersigned after tests  I spent at least 60 minutes with the patient today and more than 50% of the time was spent counseling the patient and coordinating care.     Signed, [name], MD  06/04/2016 12:10 PM    [place]  This note was generated in part with voice recognition software and I apologize for any typographical errors that were not detected and corrected.

## 2016-06-05 ENCOUNTER — Other Ambulatory Visit: Payer: Self-pay | Admitting: Family Medicine

## 2016-06-05 DIAGNOSIS — Z872 Personal history of diseases of the skin and subcutaneous tissue: Secondary | ICD-10-CM

## 2016-06-08 ENCOUNTER — Ambulatory Visit: Payer: Commercial Managed Care - HMO | Admitting: Cardiovascular Disease

## 2016-06-11 ENCOUNTER — Encounter
Admission: RE | Admit: 2016-06-11 | Discharge: 2016-06-11 | Disposition: A | Discharge status: Home / Self Care | Payer: Commercial Managed Care - HMO | Source: Ambulatory Visit | Attending: Cardiology | Admitting: Cardiology

## 2016-06-11 DIAGNOSIS — R0602 Shortness of breath: Secondary | ICD-10-CM

## 2016-06-11 MED ORDER — TECHNETIUM TC 99M TETROFOSMIN IV KIT
30.0520 | PACK | Freq: Once | INTRAVENOUS | Status: AC | PRN
  Administered 2016-06-11: 30.052 via INTRAVENOUS

## 2016-06-11 MED ORDER — REGADENOSON 0.4 MG/5ML IV SOLN
0.4000 mg | Freq: Once | INTRAVENOUS | Status: AC
  Administered 2016-06-11: 0.4 mg via INTRAVENOUS

## 2016-06-12 ENCOUNTER — Encounter
Admission: RE | Admit: 2016-06-12 | Discharge: 2016-06-12 | Disposition: A | Discharge status: Home / Self Care | Payer: Commercial Managed Care - HMO | Source: Ambulatory Visit | Attending: Cardiology | Admitting: Cardiology

## 2016-06-12 DIAGNOSIS — R0602 Shortness of breath: Secondary | ICD-10-CM | POA: Diagnosis not present

## 2016-06-12 MED ORDER — TECHNETIUM TC 99M TETROFOSMIN IV KIT
32.5200 | PACK | Freq: Once | INTRAVENOUS | Status: AC | PRN
  Administered 2016-06-12: 32.52 via INTRAVENOUS

## 2016-06-19 ENCOUNTER — Other Ambulatory Visit: Payer: Self-pay | Admitting: Family Medicine

## 2016-06-19 DIAGNOSIS — E785 Hyperlipidemia, unspecified: Secondary | ICD-10-CM

## 2016-06-19 DIAGNOSIS — I10 Essential (primary) hypertension: Secondary | ICD-10-CM

## 2016-06-25 ENCOUNTER — Ambulatory Visit: Payer: Commercial Managed Care - HMO | Admitting: Family Medicine

## 2016-06-25 NOTE — Telephone Encounter (Signed)
Pt is completely out of atenolol and simvastatin. She is asking that you send a refill to [facility]. They pharmacy gave her couple on Sunday night and she is now out of that. Please call [phone] (leave detailed message) once this has been completed.

## 2016-06-25 NOTE — Telephone Encounter (Signed)
Medication has been refilled and sent to [facility]

## 2016-07-01 ENCOUNTER — Other Ambulatory Visit: Payer: Self-pay

## 2016-07-01 ENCOUNTER — Ambulatory Visit (INDEPENDENT_AMBULATORY_CARE_PROVIDER_SITE_OTHER): Payer: Commercial Managed Care - HMO

## 2016-07-01 DIAGNOSIS — R0602 Shortness of breath: Secondary | ICD-10-CM | POA: Diagnosis not present

## 2016-07-07 ENCOUNTER — Encounter: Payer: Self-pay | Admitting: Cardiology

## 2016-07-07 ENCOUNTER — Ambulatory Visit (INDEPENDENT_AMBULATORY_CARE_PROVIDER_SITE_OTHER): Payer: Commercial Managed Care - HMO | Admitting: Cardiology

## 2016-07-07 VITALS — BP 130/84 | HR 74 | Ht 68.0 in | Wt 307.2 lb

## 2016-07-07 DIAGNOSIS — I10 Essential (primary) hypertension: Secondary | ICD-10-CM | POA: Diagnosis not present

## 2016-07-07 DIAGNOSIS — M7989 Other specified soft tissue disorders: Secondary | ICD-10-CM | POA: Diagnosis not present

## 2016-07-07 DIAGNOSIS — R0602 Shortness of breath: Secondary | ICD-10-CM

## 2016-07-07 DIAGNOSIS — F17200 Nicotine dependence, unspecified, uncomplicated: Secondary | ICD-10-CM

## 2016-07-07 DIAGNOSIS — E784 Other hyperlipidemia: Secondary | ICD-10-CM

## 2016-07-07 DIAGNOSIS — E6609 Other obesity due to excess calories: Secondary | ICD-10-CM

## 2016-07-07 DIAGNOSIS — Z6841 Body Mass Index (BMI) 40.0 and over, adult: Secondary | ICD-10-CM

## 2016-07-07 DIAGNOSIS — IMO0001 Reserved for inherently not codable concepts without codable children: Secondary | ICD-10-CM

## 2016-07-07 DIAGNOSIS — E7849 Other hyperlipidemia: Secondary | ICD-10-CM

## 2016-07-07 NOTE — Patient Instructions (Signed)
Medication Instructions:  Resume Lasix (Furosemide)   Follow-Up: Your physician recommends that you schedule a follow-up appointment in: 4 months with Dr. Yvone Neu.   Your physician recommends that you schedule a follow-up appointment with your primary care provider for possible abdominal ultrasound due to abnormal liver tests.   It was a pleasure seeing you today here in the office. Please do not hesitate to give [place] a call back if you have any further questions. [place], BSN

## 2016-07-07 NOTE — Progress Notes (Signed)
Cardiology Office Note   Date:  07/07/2016   ID:  Leah, Shah 05-Aug-1955, MRN [id number]  Referring Doctor:  Keith Rake, MD   Cardiologist:   [name], MD   Reason for consultation:  Chief Complaint  Patient presents with  . other     f/u Echo/myoview/stress. Pt c/o worse bilateral feet swelling, sob. Reviewed meds with pt verbally.      History of Present Illness: Leah Shah is a 61 y.o. female who presents for Follow-up for leg swelling.   Since last visit, patient stopped taking Lasix. She misunderstood the instruction of not increasing the dose of the Lasix. Per family with her today, he has taking a lot of salt in her food. She sleeps mostly during the day, and states that previously relates up to early morning every day.   ROS:  Please see the history of present illness. Aside from mentioned under HPI, all other systems are reviewed and negative.     Past Medical History:  Diagnosis Date  . Anxiety   . Arthritis    R knee, L knee - OA  . Dysrhythmia    "mild arrythmia after taking Redux diet med years ago"no cardio fi  . Headache(784.0)   . Heart valve problem    was on Redux and it caused some "heart valve damage"-does not see cardiologist  . Hyperlipidemia   . Hypertension    echoJefm Bryant, wnl 2009? , had taken Redux for diet management  but now  off the market. pt, told that echo wnl.    . Recurrent upper respiratory infection (URI)    treated /w OTC med.   . Shortness of breath    with exertion    Past Surgical History:  Procedure Laterality Date  . ABDOMINAL HYSTERECTOMY     partial-uterus removed  . CHOLECYSTECTOMY     2011  . COCCYX REMOVAL     [facility]  . EYE SURGERY     states both lens replaced. Not sure if cataract surgery or not.  [name] HEEL SPUR SURGERY    . I&D EXTREMITY  08/27/2011   Procedure: IRRIGATION AND DEBRIDEMENT EXTREMITY;  Surgeon: [name], MD;  Location: [facility];  Service: Orthopedics;  Laterality:  Right;  . JOINT REPLACEMENT Right 2012   Right TKR  . KNEE ARTHROPLASTY  07/28/2011   Procedure: COMPUTER ASSISTED TOTAL KNEE ARTHROPLASTY;  Surgeon: [name], MD;  Location: [facility];  Service: Orthopedics;  Laterality: Right;  right total knee arthroplasty, computer assisted  . KNEE ARTHROSCOPY     right knee  . TMJ ARTHROPLASTY     [place]     reports that she has been smoking.  She has a 25.00 pack-year smoking history. She has never used smokeless tobacco. She reports that she does not drink alcohol or use drugs.   family history includes Heart attack in her sister; Heart attack (age of onset: 26) in her maternal uncle; Heart disease in her maternal uncle and mother; Heart disease (age of onset: 41) in her sister; Hyperlipidemia in her sister; Hypertension in her father, mother, and sister.   Outpatient Medications Prior to Visit  Medication Sig Dispense Refill  . alprazolam (XANAX) 2 MG tablet Take 1 tablet (2 mg total) by mouth 2 (two) times daily as needed for anxiety. 60 tablet 0  . atenolol (TENORMIN) 100 MG tablet TAKE 1 TABLET (100 MG TOTAL) BY MOUTH DAILY. 90 tablet 0  .  cyclobenzaprine (FLEXERIL) 5 MG tablet TAKE ONE AND ONE-HALF TABLETS BY MOUTH EVERY NIGHT AT BEDTIME 45 tablet 2  . hydrOXYzine (ATARAX/VISTARIL) 25 MG tablet TAKE 1 TABLET BY MOUTH TWICE A DAY 90 tablet 1  . losartan (COZAAR) 50 MG tablet TAKE 1 TABLET (50 MG TOTAL) BY MOUTH DAILY. 90 tablet 1  . oxyCODONE (OXY IR/ROXICODONE) 5 MG immediate release tablet Take 1 tablet (5 mg total) by mouth every 8 (eight) hours as needed for moderate pain. 90 tablet 0  . simvastatin (ZOCOR) 40 MG tablet TAKE 1 TABLET (40 MG TOTAL) BY MOUTH DAILY AT 6 PM. 90 tablet 0  . traZODone (DESYREL) 150 MG tablet Take 1 tablet by mouth at bedtime.    . furosemide (LASIX) 40 MG tablet Take 1 tablet (40 mg total) by mouth 2 (two) times daily. 40 mg in AM, 20 mg in PM (Patient not taking: Reported on 07/07/2016) 60 tablet 0   No  facility-administered medications prior to visit.      Allergies: Bacitracin-neomycin-polymyxin; Codeine; Gabapentin; Neosporin  [neomycin-bacitracin zn-polymyx]; and Pregabalin    PHYSICAL EXAM: VS:  BP 130/84 (BP Location: Left Arm, Patient Position: Sitting, Cuff Size: Large)   Pulse 74   Ht 5\' 8"  (1.727 m)   Wt (!) 307 lb 4 oz (139.4 kg)   BMI 46.72 kg/m  , Body mass index is 46.72 kg/m. Wt Readings from Last 3 Encounters:  07/07/16 (!) 307 lb 4 oz (139.4 kg)  06/04/16 (!) 310 lb (140.6 kg)  06/01/16 (!) 307 lb 14.4 oz (139.7 kg)    GENERAL:  well developed, well nourished, Morbidly obese, not in acute distress HEENT: normocephalic, pink conjunctivae, anicteric sclerae, no xanthelasma, normal dentition, oropharynx clear NECK:  no neck vein engorgement, JVP normal, no hepatojugular reflux, carotid upstroke brisk and symmetric, no bruit, no thyromegaly, no lymphadenopathy LUNGS:  good respiratory effort, clear to auscultation bilaterally CV:  PMI not displaced, no thrills, no lifts, S1 and S2 within normal limits, no palpable S3 or S4, no murmurs, no rubs, no gallops ABD:  Soft, nontender, nondistended, normoactive bowel sounds, no abdominal aortic bruit, no hepatomegaly, no splenomegaly MS: nontender back, no kyphosis, no scoliosis, no joint deformities EXT:  2+ DP/PT pulses, +2 edema, nonpitting edema on dorsum of feet, no varicosities, no cyanosis, no clubbing SKIN: warm, nondiaphoretic, normal turgor, no ulcers NEUROPSYCH: alert, oriented to person, place, and time, sensory/motor grossly intact, normal mood, appropriate affect  Recent Labs: 02/24/2016: ALT 35; BUN 12; Creat 0.72; Hemoglobin 12.4; Platelets 116; Potassium 4.3; Sodium 142 04/29/2016: Brain Natriuretic Peptide 156.9; TSH 3.50   Lipid Panel    Component Value Date/Time   CHOL 86 (L) 12/12/2015 1438   TRIG 95 12/12/2015 1438   HDL 24 (L) 12/12/2015 1438   CHOLHDL 3.6 12/12/2015 1438   LDLCALC 43 12/12/2015  1438     Other studies Reviewed:  EKG:  The ekg from11/30/2017 was personally reviewed by me and it revealed sinus rhythm, 63 BPM. QT recalculated at 440 ms, QTC 445 ms, within normal limits.  Additional studies/ records that were reviewed personally reviewed by me today include:   Echo 07/01/2016: Left ventricle: The cavity size was normal. Systolic function was   normal. The estimated ejection fraction was in the range of 60%   to 65%. Wall motion was normal; there were no regional wall   motion abnormalities. Left ventricular diastolic function   parameters were normal. - Mitral valve: There was moderate regurgitation. - Left atrium: The  atrium was mildly dilated. - Right ventricle: Systolic function was normal. - Pulmonary arteries: Systolic pressure was mildly elevated. PA   peak pressure: 36 mm Hg (S).   ASSESSMENT AND PLAN: Leg swelling This is likely multifactorial: Fluid retention, lymphedema, obesity causing increased abdominal pressure and impeding venous and lymph flow Based on workup, and there is no evidence of systolic or even diastolic dysfunction. No evidence of ischemia on stress test per preliminary report. Recommend patient to resume Lasix that was prescribed by PCP. Recommend to follow-up with PCP regarding LFT abnormalities, possibly may need ultrasound of the liver. Recommend follow with PCP regarding compression stockings, lymphedema clinic.  Shortness of breath Likely again multifactorial. Patient has gained almost 100 pounds in the last 2 years due to dietary indiscretion. Patient continues to smoke. No evidence of ischemia on preliminary report of stress test.  HTN BP is well controlled. Continue monitoring BP. Continue current medical therapy and lifestyle changes.  Mitral regurgitation Likely in relation to hypertension Reevaluation in one year.  Hyperlipidemia Patient on statin therapy through PCP.  Morbid obesity Body mass index is 46.72  kg/m.[name] Recommend aggressive weight loss through diet and increased physical activity.   Tobacco abuse We discussed the importance of smoking cessation and different strategies for quitting.   Current medicines are reviewed at length with the patient today.  The patient does not have concerns regarding medicines.  Labs/ tests ordered today include:  No orders of the defined types were placed in this encounter.   I had a lengthy and detailed discussion with the patient regarding diagnoses, prognosis, diagnostic options, treatment options , and side effects of medications.   I counseled the patient on importance of lifestyle modification including heart healthy diet, regular physical activity , and smoking cessation. We spent a great deal of time talking about how different factors are playing a role in her symptoms of leg swelling and shortness of breath. We talked about the importance of weight control, lifestyle changes, dietary restriction, weight loss.   Disposition:   FU with undersigned after tests  I spent at least 40 minutes with the patient today and more than 50% of the time was spent counseling the patient and coordinating care.     Signed, [name], MD  07/07/2016 2:46 PM    [place]  This note was generated in part with voice recognition software and I apologize for any typographical errors that were not detected and corrected.

## 2016-07-08 ENCOUNTER — Other Ambulatory Visit: Payer: Self-pay | Admitting: Family Medicine

## 2016-07-08 ENCOUNTER — Ambulatory Visit (INDEPENDENT_AMBULATORY_CARE_PROVIDER_SITE_OTHER): Payer: Commercial Managed Care - HMO | Admitting: Family Medicine

## 2016-07-08 ENCOUNTER — Encounter: Payer: Self-pay | Admitting: Family Medicine

## 2016-07-08 DIAGNOSIS — G8929 Other chronic pain: Secondary | ICD-10-CM | POA: Diagnosis not present

## 2016-07-08 DIAGNOSIS — M545 Low back pain: Secondary | ICD-10-CM | POA: Diagnosis not present

## 2016-07-08 DIAGNOSIS — F419 Anxiety disorder, unspecified: Secondary | ICD-10-CM | POA: Diagnosis not present

## 2016-07-08 MED ORDER — CYCLOBENZAPRINE HCL 5 MG PO TABS: ORAL_TABLET | ORAL | 2 refills | Status: DC

## 2016-07-08 MED ORDER — OXYCODONE HCL 5 MG PO TABS: 5.0000 mg | ORAL_TABLET | Freq: Three times a day (TID) | ORAL | 0 refills | Status: DC | PRN

## 2016-07-08 MED ORDER — ALPRAZOLAM 2 MG PO TABS: 2.0000 mg | ORAL_TABLET | Freq: Two times a day (BID) | ORAL | 0 refills | Status: DC | PRN

## 2016-07-08 NOTE — Progress Notes (Signed)
Name: Leah Shah   MRN: [id number]    DOB: [birth year]   Date:07/08/2016       Progress Note  Subjective  Chief Complaint  Chief Complaint  Patient presents with  . Follow-up    medication refills    Anxiety  Presents for follow-up visit. The problem has been unchanged. Symptoms include excessive worry, insomnia, nervous/anxious behavior and panic. Patient reports no obsessions. Symptoms occur most days. The severity of symptoms is moderate and causing significant distress.   Her past medical history is significant for anxiety/panic attacks. Past treatments include benzodiazephines. Compliance with prior treatments has been good.  Back Pain  This is a chronic problem. The problem occurs daily. The problem is unchanged. The pain is present in the lumbar spine and sacro-iliac. The pain is at a severity of 7/10. The symptoms are aggravated by bending. Pertinent negatives include no bladder incontinence or bowel incontinence. She has tried analgesics for the symptoms.    Past Medical History:  Diagnosis Date  . Anxiety   . Arthritis    R knee, L knee - OA  . Dysrhythmia    "mild arrythmia after taking Redux diet med years ago"no cardio fi  . Headache(784.0)   . Heart valve problem    was on Redux and it caused some "heart valve damage"-does not see cardiologist  . Hyperlipidemia   . Hypertension    echoJefm Bryant, wnl 2009? , had taken Redux for diet management  but now  off the market. pt, told that echo wnl.    . Recurrent upper respiratory infection (URI)    treated /w OTC med.   . Shortness of breath    with exertion    Past Surgical History:  Procedure Laterality Date  . ABDOMINAL HYSTERECTOMY     partial-uterus removed  . CHOLECYSTECTOMY     2011  . COCCYX REMOVAL     [facility]  . EYE SURGERY     states both lens replaced. Not sure if cataract surgery or not.  [name] HEEL SPUR SURGERY    . I&D EXTREMITY  08/27/2011   Procedure: IRRIGATION AND DEBRIDEMENT EXTREMITY;   Surgeon: [name], MD;  Location: [place];  Service: Orthopedics;  Laterality: Right;  . JOINT REPLACEMENT Right 2012   Right TKR  . KNEE ARTHROPLASTY  07/28/2011   Procedure: COMPUTER ASSISTED TOTAL KNEE ARTHROPLASTY;  Surgeon: [name], MD;  Location: [place];  Service: Orthopedics;  Laterality: Right;  right total knee arthroplasty, computer assisted  . KNEE ARTHROSCOPY     right knee  . TMJ ARTHROPLASTY     [facility]- 1997    Family History  Problem Relation Age of Onset  . Heart disease Mother   . Hypertension Mother   . Hypertension Father   . Heart disease Sister 58    stent   . Heart attack Sister   . Hyperlipidemia Sister   . Hypertension Sister   . Heart attack Maternal Uncle 64  . Heart disease Maternal Uncle     CABG    Social History   Social History  . Marital status: Married    Spouse name: N/A  . Number of children: N/A  . Years of education: N/A   Occupational History  . Not on file.   Social History Main Topics  . Smoking status: Current Every Day Smoker    Packs/day: 0.50    Years: 50.00  . Smokeless tobacco: Never Used  . Alcohol use No  .  Drug use: No  . Sexual activity: Not Currently   Other Topics Concern  . Not on file   Social History Narrative  . No narrative on file     Current Outpatient Prescriptions:  .  alprazolam (XANAX) 2 MG tablet, Take 1 tablet (2 mg total) by mouth 2 (two) times daily as needed for anxiety., Disp: 60 tablet, Rfl: 0 .  atenolol (TENORMIN) 100 MG tablet, TAKE 1 TABLET (100 MG TOTAL) BY MOUTH DAILY., Disp: 90 tablet, Rfl: 0 .  cyclobenzaprine (FLEXERIL) 5 MG tablet, TAKE ONE AND ONE-HALF TABLETS BY MOUTH EVERY NIGHT AT BEDTIME, Disp: 45 tablet, Rfl: 2 .  hydrOXYzine (ATARAX/VISTARIL) 25 MG tablet, TAKE 1 TABLET BY MOUTH TWICE A DAY, Disp: 90 tablet, Rfl: 1 .  losartan (COZAAR) 50 MG tablet, TAKE 1 TABLET (50 MG TOTAL) BY MOUTH DAILY., Disp: 90 tablet, Rfl: 1 .  oxyCODONE (OXY IR/ROXICODONE) 5 MG  immediate release tablet, Take 1 tablet (5 mg total) by mouth every 8 (eight) hours as needed for moderate pain., Disp: 90 tablet, Rfl: 0 .  simvastatin (ZOCOR) 40 MG tablet, TAKE 1 TABLET (40 MG TOTAL) BY MOUTH DAILY AT 6 PM., Disp: 90 tablet, Rfl: 0 .  traZODone (DESYREL) 150 MG tablet, Take 1 tablet by mouth at bedtime., Disp: , Rfl:   Allergies  Allergen Reactions  . Bacitracin-Neomycin-Polymyxin Other (See Comments)  . Codeine Other (See Comments)    Itching "she can take some forms of it" Itching "she can take some forms of it"  . Gabapentin Nausea Only  . Neosporin  [Neomycin-Bacitracin Zn-Polymyx]   . Pregabalin Other (See Comments)    stomach issues stomach issues     Review of Systems  Gastrointestinal: Negative for bowel incontinence.  Genitourinary: Negative for bladder incontinence.  Musculoskeletal: Positive for back pain.  Psychiatric/Behavioral: The patient is nervous/anxious and has insomnia.       Objective  Vitals:   07/08/16 1111  BP: 118/80  Pulse: 76  Resp: 18  Temp: 98.1 F (36.7 C)  TempSrc: Oral  SpO2: 92%  Weight: (!) 307 lb (139.3 kg)  Height: 5\' 8"  (1.727 m)    Physical Exam  Constitutional: She is oriented to person, place, and time and well-developed, well-nourished, and in no distress.  Cardiovascular: Normal rate, regular rhythm, S1 normal, S2 normal and normal heart sounds.   No murmur heard. Pulmonary/Chest: Effort normal and breath sounds normal. She has no wheezes.  Musculoskeletal:       Right ankle: She exhibits swelling. Tenderness.       Left ankle: She exhibits swelling. Tenderness.       Lumbar back: She exhibits tenderness and pain. She exhibits no spasm.       Back:  3+ pitting edema bilateral lower extremities.  Neurological: She is alert and oriented to person, place, and time.  Psychiatric: Mood, memory, affect and judgment normal.  Nursing note and vitals reviewed.   Assessment & Plan  1. Anxiety  -  alprazolam (XANAX) 2 MG tablet; Take 1 tablet (2 mg total) by mouth 2 (two) times daily as needed for anxiety.  Dispense: 60 tablet; Refill: 0  2. Chronic midline low back pain without sciatica  - oxyCODONE (OXY IR/ROXICODONE) 5 MG immediate release tablet; Take 1 tablet (5 mg total) by mouth every 8 (eight) hours as needed for moderate pain.  Dispense: 90 tablet; Refill: 0 - cyclobenzaprine (FLEXERIL) 5 MG tablet; TAKE ONE AND ONE-HALF TABLETS BY MOUTH EVERY NIGHT AT BEDTIME  Dispense: 45 tablet; Refill: 2   Humzah Harty [facility] 07/08/2016 11:20 AM

## 2016-08-05 LAB — NM MYOCAR MULTI W/SPECT W/WALL MOTION / EF
CHL CUP NUCLEAR SSS: 9
CSEPPHR: 90 {beats}/min
LV sys vol: 41 mL
LVDIAVOL: 138 mL (ref 46–106)
Percent HR: 56 %
Rest HR: 79 {beats}/min
SDS: 6
SRS: 4
TID: 0.97

## 2016-08-07 ENCOUNTER — Ambulatory Visit (INDEPENDENT_AMBULATORY_CARE_PROVIDER_SITE_OTHER): Payer: Medicare HMO | Admitting: Family Medicine

## 2016-08-07 ENCOUNTER — Encounter: Payer: Self-pay | Admitting: Family Medicine

## 2016-08-07 VITALS — BP 124/82 | HR 73 | Temp 97.9°F | Resp 18 | Ht 68.0 in | Wt 299.9 lb

## 2016-08-07 DIAGNOSIS — E785 Hyperlipidemia, unspecified: Secondary | ICD-10-CM | POA: Diagnosis not present

## 2016-08-07 DIAGNOSIS — F419 Anxiety disorder, unspecified: Secondary | ICD-10-CM

## 2016-08-07 DIAGNOSIS — M545 Low back pain, unspecified: Secondary | ICD-10-CM

## 2016-08-07 DIAGNOSIS — R739 Hyperglycemia, unspecified: Secondary | ICD-10-CM | POA: Diagnosis not present

## 2016-08-07 DIAGNOSIS — G8929 Other chronic pain: Secondary | ICD-10-CM

## 2016-08-07 LAB — LIPID PANEL
CHOLESTEROL: 103 mg/dL (ref <200)
HDL: 37 mg/dL — AB (ref >50)
LDL Cholesterol: 49 mg/dL (ref <100)
TRIGLYCERIDES: 84 mg/dL (ref <150)
Total CHOL/HDL Ratio: 2.8 Ratio (ref <5.0)
VLDL: 17 mg/dL (ref <30)

## 2016-08-07 MED ORDER — ALPRAZOLAM 2 MG PO TABS: 2.0000 mg | ORAL_TABLET | Freq: Two times a day (BID) | ORAL | 0 refills | Status: DC | PRN

## 2016-08-07 MED ORDER — OXYCODONE HCL 5 MG PO TABS: 5.0000 mg | ORAL_TABLET | Freq: Three times a day (TID) | ORAL | 0 refills | Status: DC | PRN

## 2016-08-07 NOTE — Progress Notes (Signed)
Name: Leah Shah   MRN: [id number]    DOB: 1956/03/28   Date:08/07/2016       Progress Note  Subjective  Chief Complaint  Chief Complaint  Patient presents with  . Medication Refill    Anxiety  Presents for follow-up visit. The problem has been unchanged. Symptoms include excessive worry, insomnia, nervous/anxious behavior and panic. Patient reports no obsessions. Symptoms occur most days. The severity of symptoms is moderate and causing significant distress.   Her past medical history is significant for anxiety/panic attacks. Past treatments include benzodiazephines. Compliance with prior treatments has been good.  Back Pain  This is a chronic problem. The problem occurs daily. The problem is unchanged. The pain is present in the lumbar spine and sacro-iliac. The pain is at a severity of 7/10. The symptoms are aggravated by bending and position (worse with walking especially with arthritis in left knee). Pertinent negatives include no bladder incontinence or bowel incontinence. She has tried analgesics for the symptoms.    Past Medical History:  Diagnosis Date  . Anxiety   . Arthritis    R knee, L knee - OA  . Dysrhythmia    "mild arrythmia after taking Redux diet med years ago"no cardio fi  . Headache(784.0)   . Heart valve problem    was on Redux and it caused some "heart valve damage"-does not see cardiologist  . Hyperlipidemia   . Hypertension    echoJefm Bryant, wnl 2009? , had taken Redux for diet management  but now  off the market. pt, told that echo wnl.    . Recurrent upper respiratory infection (URI)    treated /w OTC med.   . Shortness of breath    with exertion    Past Surgical History:  Procedure Laterality Date  . ABDOMINAL HYSTERECTOMY     partial-uterus removed  . CHOLECYSTECTOMY     2011  . COCCYX REMOVAL     [facility]  . EYE SURGERY     states both lens replaced. Not sure if cataract surgery or not.  [name] HEEL SPUR SURGERY    . I&D EXTREMITY   08/27/2011   Procedure: IRRIGATION AND DEBRIDEMENT EXTREMITY;  Surgeon: [name], MD;  Location: [place];  Service: Orthopedics;  Laterality: Right;  . JOINT REPLACEMENT Right 2012   Right TKR  . KNEE ARTHROPLASTY  07/28/2011   Procedure: COMPUTER ASSISTED TOTAL KNEE ARTHROPLASTY;  Surgeon: [name], MD;  Location: [place];  Service: Orthopedics;  Laterality: Right;  right total knee arthroplasty, computer assisted  . KNEE ARTHROSCOPY     right knee  . TMJ ARTHROPLASTY     [facility]- [facility]    Family History  Problem Relation Age of Onset  . Heart disease Mother   . Hypertension Mother   . Hypertension Father   . Heart disease Sister 49    stent   . Heart attack Sister   . Hyperlipidemia Sister   . Hypertension Sister   . Heart attack Maternal Uncle 64  . Heart disease Maternal Uncle     CABG    Social History   Social History  . Marital status: Married    Spouse name: N/A  . Number of children: N/A  . Years of education: N/A   Occupational History  . Not on file.   Social History Main Topics  . Smoking status: Current Every Day Smoker    Packs/day: 0.50    Years: 50.00  . Smokeless tobacco:  Never Used  . Alcohol use No  . Drug use: No  . Sexual activity: Not Currently   Other Topics Concern  . Not on file   Social History Narrative  . No narrative on file     Current Outpatient Prescriptions:  .  alprazolam (XANAX) 2 MG tablet, Take 1 tablet (2 mg total) by mouth 2 (two) times daily as needed for anxiety., Disp: 60 tablet, Rfl: 0 .  atenolol (TENORMIN) 100 MG tablet, TAKE 1 TABLET (100 MG TOTAL) BY MOUTH DAILY., Disp: 90 tablet, Rfl: 0 .  cyclobenzaprine (FLEXERIL) 5 MG tablet, TAKE ONE AND ONE-HALF TABLETS BY MOUTH EVERY NIGHT AT BEDTIME, Disp: 45 tablet, Rfl: 2 .  hydrOXYzine (ATARAX/VISTARIL) 25 MG tablet, TAKE 1 TABLET BY MOUTH TWICE A DAY, Disp: 90 tablet, Rfl: 1 .  losartan (COZAAR) 50 MG tablet, TAKE 1 TABLET (50 MG TOTAL) BY MOUTH DAILY.,  Disp: 90 tablet, Rfl: 1 .  oxyCODONE (OXY IR/ROXICODONE) 5 MG immediate release tablet, Take 1 tablet (5 mg total) by mouth every 8 (eight) hours as needed for moderate pain., Disp: 90 tablet, Rfl: 0 .  simvastatin (ZOCOR) 40 MG tablet, TAKE 1 TABLET (40 MG TOTAL) BY MOUTH DAILY AT 6 PM., Disp: 90 tablet, Rfl: 0 .  traZODone (DESYREL) 150 MG tablet, Take 1 tablet by mouth at bedtime., Disp: , Rfl:   Allergies  Allergen Reactions  . Bacitracin-Neomycin-Polymyxin Other (See Comments)  . Codeine Other (See Comments)    Itching "she can take some forms of it" Itching "she can take some forms of it"  . Gabapentin Nausea Only  . Neosporin  [Neomycin-Bacitracin Zn-Polymyx]   . Pregabalin Other (See Comments)    stomach issues stomach issues     Review of Systems  Gastrointestinal: Negative for bowel incontinence.  Genitourinary: Negative for bladder incontinence.  Musculoskeletal: Positive for back pain.  Psychiatric/Behavioral: The patient is nervous/anxious and has insomnia.     Objective  Vitals:   08/07/16 1152  BP: 124/82  Pulse: 73  Resp: 18  Temp: 97.9 F (36.6 C)  TempSrc: Oral  SpO2: 96%  Weight: 299 lb 14.4 oz (136 kg)  Height: 5\' 8"  (1.727 m)    Physical Exam  Constitutional: She is oriented to person, place, and time and well-developed, well-nourished, and in no distress.  HENT:  Head: Normocephalic and atraumatic.  Cardiovascular: Normal rate, regular rhythm, S1 normal, S2 normal and normal heart sounds.   No murmur heard. Pulmonary/Chest: Effort normal and breath sounds normal. She has no wheezes.  Musculoskeletal: She exhibits edema and tenderness.       Right ankle: She exhibits swelling. Tenderness.       Left ankle: She exhibits swelling. Tenderness.       Lumbar back: She exhibits tenderness and pain. She exhibits no spasm.       Back:  2+ pitting edema bilateral lower extremities.  Neurological: She is alert and oriented to person, place, and time.    Psychiatric: Mood, memory, affect and judgment normal.  Nursing note and vitals reviewed.     Assessment & Plan  1. Anxiety Stable and responsive to alprazolam taken twice daily when needed, patient compliant with controlled substances agreement. Refills provided - alprazolam (XANAX) 2 MG tablet; Take 1 tablet (2 mg total) by mouth 2 (two) times daily as needed for anxiety.  Dispense: 60 tablet; Refill: 0  2. Chronic midline low back pain without sciatica Stable and responsive to oxycodone taken every 8 hours when  needed, also helps alleviate some of the knee pain. Patient compliant with controlled substances agreement - oxyCODONE (OXY IR/ROXICODONE) 5 MG immediate release tablet; Take 1 tablet (5 mg total) by mouth every 8 (eight) hours as needed for moderate pain.  Dispense: 90 tablet; Refill: 0  3. Dyslipidemia Obtain FLP and follow-up - Lipid Profile  4. Hyperglycemia Point-of-care A1c is 5.0%, considered normal - POCT HgB A1C    Gennett Garcia [facility] 08/07/2016 11:59 AM

## 2016-08-10 LAB — POCT GLYCOSYLATED HEMOGLOBIN (HGB A1C): HEMOGLOBIN A1C: 5

## 2016-08-27 ENCOUNTER — Encounter: Payer: Self-pay | Admitting: Family Medicine

## 2016-08-27 ENCOUNTER — Ambulatory Visit (INDEPENDENT_AMBULATORY_CARE_PROVIDER_SITE_OTHER): Payer: Medicare HMO | Admitting: Family Medicine

## 2016-08-27 ENCOUNTER — Emergency Department: Payer: Medicare HMO

## 2016-08-27 ENCOUNTER — Inpatient Hospital Stay
Admission: EM | Admit: 2016-08-27 | Discharge: 2016-08-29 | DRG: 292 | Disposition: A | Discharge status: Home / Self Care | Payer: Medicare HMO | Attending: Internal Medicine | Admitting: Internal Medicine

## 2016-08-27 ENCOUNTER — Encounter: Payer: Self-pay | Admitting: Emergency Medicine

## 2016-08-27 VITALS — BP 138/84 | HR 76 | Temp 98.1°F | Resp 18 | Ht 68.0 in | Wt 304.0 lb

## 2016-08-27 DIAGNOSIS — J441 Chronic obstructive pulmonary disease with (acute) exacerbation: Secondary | ICD-10-CM | POA: Diagnosis not present

## 2016-08-27 DIAGNOSIS — F17210 Nicotine dependence, cigarettes, uncomplicated: Secondary | ICD-10-CM | POA: Diagnosis present

## 2016-08-27 DIAGNOSIS — D539 Nutritional anemia, unspecified: Secondary | ICD-10-CM | POA: Diagnosis not present

## 2016-08-27 DIAGNOSIS — Z9114 Patient's other noncompliance with medication regimen: Secondary | ICD-10-CM | POA: Diagnosis not present

## 2016-08-27 DIAGNOSIS — I4891 Unspecified atrial fibrillation: Secondary | ICD-10-CM | POA: Insufficient documentation

## 2016-08-27 DIAGNOSIS — M170 Bilateral primary osteoarthritis of knee: Secondary | ICD-10-CM | POA: Diagnosis present

## 2016-08-27 DIAGNOSIS — D7589 Other specified diseases of blood and blood-forming organs: Secondary | ICD-10-CM | POA: Diagnosis present

## 2016-08-27 DIAGNOSIS — Z96651 Presence of right artificial knee joint: Secondary | ICD-10-CM | POA: Diagnosis present

## 2016-08-27 DIAGNOSIS — Z6841 Body Mass Index (BMI) 40.0 and over, adult: Secondary | ICD-10-CM | POA: Diagnosis not present

## 2016-08-27 DIAGNOSIS — I110 Hypertensive heart disease with heart failure: Secondary | ICD-10-CM | POA: Diagnosis not present

## 2016-08-27 DIAGNOSIS — D649 Anemia, unspecified: Secondary | ICD-10-CM | POA: Diagnosis present

## 2016-08-27 DIAGNOSIS — R609 Edema, unspecified: Secondary | ICD-10-CM | POA: Diagnosis not present

## 2016-08-27 DIAGNOSIS — Z9111 Patient's noncompliance with dietary regimen: Secondary | ICD-10-CM | POA: Diagnosis not present

## 2016-08-27 DIAGNOSIS — E669 Obesity, unspecified: Secondary | ICD-10-CM

## 2016-08-27 DIAGNOSIS — M1990 Unspecified osteoarthritis, unspecified site: Secondary | ICD-10-CM | POA: Diagnosis not present

## 2016-08-27 DIAGNOSIS — Z825 Family history of asthma and other chronic lower respiratory diseases: Secondary | ICD-10-CM

## 2016-08-27 DIAGNOSIS — I5032 Chronic diastolic (congestive) heart failure: Secondary | ICD-10-CM

## 2016-08-27 DIAGNOSIS — Z9889 Other specified postprocedural states: Secondary | ICD-10-CM

## 2016-08-27 DIAGNOSIS — I340 Nonrheumatic mitral (valve) insufficiency: Secondary | ICD-10-CM | POA: Diagnosis present

## 2016-08-27 DIAGNOSIS — R0609 Other forms of dyspnea: Secondary | ICD-10-CM | POA: Diagnosis not present

## 2016-08-27 DIAGNOSIS — Z9049 Acquired absence of other specified parts of digestive tract: Secondary | ICD-10-CM

## 2016-08-27 DIAGNOSIS — R600 Localized edema: Secondary | ICD-10-CM | POA: Diagnosis not present

## 2016-08-27 DIAGNOSIS — J432 Centrilobular emphysema: Secondary | ICD-10-CM

## 2016-08-27 DIAGNOSIS — I5033 Acute on chronic diastolic (congestive) heart failure: Secondary | ICD-10-CM | POA: Diagnosis present

## 2016-08-27 DIAGNOSIS — R0602 Shortness of breath: Secondary | ICD-10-CM | POA: Diagnosis not present

## 2016-08-27 DIAGNOSIS — Z23 Encounter for immunization: Secondary | ICD-10-CM | POA: Diagnosis not present

## 2016-08-27 DIAGNOSIS — I5031 Acute diastolic (congestive) heart failure: Secondary | ICD-10-CM | POA: Diagnosis not present

## 2016-08-27 DIAGNOSIS — D696 Thrombocytopenia, unspecified: Secondary | ICD-10-CM | POA: Diagnosis present

## 2016-08-27 DIAGNOSIS — Z8249 Family history of ischemic heart disease and other diseases of the circulatory system: Secondary | ICD-10-CM | POA: Diagnosis not present

## 2016-08-27 DIAGNOSIS — E668 Other obesity: Secondary | ICD-10-CM

## 2016-08-27 DIAGNOSIS — I509 Heart failure, unspecified: Secondary | ICD-10-CM | POA: Diagnosis not present

## 2016-08-27 DIAGNOSIS — Z90710 Acquired absence of both cervix and uterus: Secondary | ICD-10-CM

## 2016-08-27 DIAGNOSIS — I10 Essential (primary) hypertension: Secondary | ICD-10-CM | POA: Diagnosis not present

## 2016-08-27 HISTORY — DX: Morbid (severe) obesity due to excess calories: E66.01

## 2016-08-27 HISTORY — DX: Personal history of other medical treatment: Z92.89

## 2016-08-27 HISTORY — DX: Unspecified osteoarthritis, unspecified site: M19.90

## 2016-08-27 HISTORY — DX: Chronic diastolic (congestive) heart failure: I50.32

## 2016-08-27 HISTORY — DX: Nonrheumatic mitral (valve) insufficiency: I34.0

## 2016-08-27 LAB — BASIC METABOLIC PANEL
ANION GAP: 6 (ref 5–15)
BUN: 9 mg/dL (ref 6–20)
CHLORIDE: 110 mmol/L (ref 101–111)
CO2: 24 mmol/L (ref 22–32)
CREATININE: 0.38 mg/dL — AB (ref 0.44–1.00)
Calcium: 8.4 mg/dL — ABNORMAL LOW (ref 8.9–10.3)
GFR calc non Af Amer: >60 mL/min (ref >60)
Glucose, Bld: 94 mg/dL (ref 65–99)
Potassium: 4.5 mmol/L (ref 3.5–5.1)
SODIUM: 140 mmol/L (ref 135–145)

## 2016-08-27 LAB — CBC
HCT: 33.8 % — ABNORMAL LOW (ref 35.0–47.0)
HEMOGLOBIN: 11.8 g/dL — AB (ref 12.0–16.0)
MCH: 42.2 pg — ABNORMAL HIGH (ref 26.0–34.0)
MCHC: 34.8 g/dL (ref 32.0–36.0)
MCV: 121 fL — ABNORMAL HIGH (ref 80.0–100.0)
PLATELETS: 86 10*3/uL — AB (ref 150–440)
RBC: 2.79 MIL/uL — AB (ref 3.80–5.20)
RDW: 17.7 % — ABNORMAL HIGH (ref 11.5–14.5)
WBC: 5.6 10*3/uL (ref 3.6–11.0)

## 2016-08-27 LAB — TROPONIN I

## 2016-08-27 LAB — BRAIN NATRIURETIC PEPTIDE: B Natriuretic Peptide: 317 pg/mL — ABNORMAL HIGH (ref 0.0–100.0)

## 2016-08-27 MED ORDER — SODIUM CHLORIDE 0.9% FLUSH: 3.0000 mL | INTRAVENOUS | Status: DC | PRN

## 2016-08-27 MED ORDER — OXYCODONE HCL 5 MG PO TABS: 5.0000 mg | ORAL_TABLET | Freq: Three times a day (TID) | ORAL | Status: DC | PRN

## 2016-08-27 MED ORDER — ALPRAZOLAM 1 MG PO TABS: 2.0000 mg | ORAL_TABLET | Freq: Two times a day (BID) | ORAL | Status: DC | PRN

## 2016-08-27 MED ORDER — ATENOLOL 100 MG PO TABS
100.0000 mg | ORAL_TABLET | Freq: Every day | ORAL | Status: DC
  Administered 2016-08-28 – 2016-08-29 (×2): 100 mg via ORAL
  Filled 2016-08-27 (×3): qty 1

## 2016-08-27 MED ORDER — FUROSEMIDE 10 MG/ML IJ SOLN
40.0000 mg | Freq: Once | INTRAMUSCULAR | Status: AC
  Administered 2016-08-27: 40 mg via INTRAVENOUS
  Filled 2016-08-27: qty 4

## 2016-08-27 MED ORDER — IPRATROPIUM-ALBUTEROL 0.5-2.5 (3) MG/3ML IN SOLN
3.0000 mL | Freq: Once | RESPIRATORY_TRACT | Status: AC
  Administered 2016-08-27: 3 mL via RESPIRATORY_TRACT
  Filled 2016-08-27: qty 3

## 2016-08-27 MED ORDER — LOSARTAN POTASSIUM 50 MG PO TABS
50.0000 mg | ORAL_TABLET | Freq: Every day | ORAL | Status: DC
  Administered 2016-08-28 – 2016-08-29 (×2): 50 mg via ORAL
  Filled 2016-08-27 (×2): qty 1

## 2016-08-27 MED ORDER — SODIUM CHLORIDE 0.9% FLUSH
3.0000 mL | Freq: Two times a day (BID) | INTRAVENOUS | Status: DC
  Administered 2016-08-28 – 2016-08-29 (×4): 3 mL via INTRAVENOUS

## 2016-08-27 MED ORDER — MORPHINE SULFATE (PF) 4 MG/ML IV SOLN
INTRAVENOUS | Status: AC
  Filled 2016-08-27: qty 1

## 2016-08-27 MED ORDER — SIMVASTATIN 20 MG PO TABS
40.0000 mg | ORAL_TABLET | Freq: Every day | ORAL | Status: DC
  Administered 2016-08-28: 40 mg via ORAL
  Filled 2016-08-27: qty 2

## 2016-08-27 MED ORDER — SODIUM CHLORIDE 0.9 % IV SOLN: 250.0000 mL | INTRAVENOUS | Status: DC | PRN

## 2016-08-27 MED ORDER — ONDANSETRON HCL 4 MG/2ML IJ SOLN: 4.0000 mg | Freq: Four times a day (QID) | INTRAMUSCULAR | Status: DC | PRN

## 2016-08-27 MED ORDER — ACETAMINOPHEN 325 MG PO TABS
650.0000 mg | ORAL_TABLET | ORAL | Status: DC | PRN
  Administered 2016-08-28: 650 mg via ORAL
  Filled 2016-08-27: qty 2

## 2016-08-27 MED ORDER — FUROSEMIDE 10 MG/ML IJ SOLN
40.0000 mg | Freq: Every day | INTRAMUSCULAR | Status: DC
  Administered 2016-08-28: 40 mg via INTRAVENOUS
  Filled 2016-08-27: qty 4

## 2016-08-27 MED ORDER — METHYLPREDNISOLONE SODIUM SUCC 125 MG IJ SOLR
125.0000 mg | Freq: Once | INTRAMUSCULAR | Status: AC
  Administered 2016-08-27: 125 mg via INTRAVENOUS
  Filled 2016-08-27: qty 2

## 2016-08-27 MED ORDER — HYDROXYZINE HCL 25 MG PO TABS
25.0000 mg | ORAL_TABLET | Freq: Two times a day (BID) | ORAL | Status: DC
  Administered 2016-08-28 – 2016-08-29 (×4): 25 mg via ORAL
  Filled 2016-08-27 (×5): qty 1

## 2016-08-27 MED ORDER — ASPIRIN EC 81 MG PO TBEC
81.0000 mg | DELAYED_RELEASE_TABLET | Freq: Every day | ORAL | Status: DC
  Administered 2016-08-28 – 2016-08-29 (×2): 81 mg via ORAL
  Filled 2016-08-27 (×2): qty 1

## 2016-08-27 MED ORDER — PNEUMOCOCCAL VAC POLYVALENT 25 MCG/0.5ML IJ INJ
0.5000 mL | INJECTION | INTRAMUSCULAR | Status: AC
  Administered 2016-08-28: 0.5 mL via INTRAMUSCULAR
  Filled 2016-08-27: qty 0.5

## 2016-08-27 MED ORDER — ENOXAPARIN SODIUM 40 MG/0.4ML ~~LOC~~ SOLN
40.0000 mg | Freq: Two times a day (BID) | SUBCUTANEOUS | Status: DC
  Administered 2016-08-28 – 2016-08-29 (×3): 40 mg via SUBCUTANEOUS
  Filled 2016-08-27 (×3): qty 0.4

## 2016-08-27 NOTE — ED Notes (Signed)
O2 saturation 90-92% on room air, dropping with exertion. Patient placed on 2L [place] to supplement in anticipation of frequent movement to restroom after lasix administration. MD made aware.

## 2016-08-27 NOTE — Progress Notes (Signed)
Lovenox changed to BID for BMI >40 and CrCl >30.

## 2016-08-27 NOTE — H&P (Addendum)
History and Physical   [facility] - [place] @ [facility] Admission History and Physical [employer], D.O.    Patient Name: Leah Shah MR#: [id number] Date of Birth: 1955-08-19 Date of Admission: 08/27/2016  Referring MD/NP/PA: Dr. Corky Downs Primary Care Physician: Keith Rake, MD Outpatient Specialists: Dr. Juluis Mire)  Patient coming from: Home  Chief Complaint: SOB  HPI: Leah Shah is a 61 y.o. female with a known history of  anxiety, OA, HTN, HLD, LE edema, tobacco dependence presents to the emergency department complaining of one-week history progressively worsening shortness of breath associated with dyspnea on exertion, two pillow orthopnea and lower extremity edema which is significantly worse than her usual chronic edema. She visited her primary care provider started her on Lasix for lower extremity edema about 2 months ago which did not help. She stopped taking her Lasix about one week ago because of urinary frequency. She also reports extreme amount of stress in her life and admits to very high salt diet. She reports at baseline her functional capacity is limited by chronic bilateral osteoarthritis knees. Her daughter-in-law reports that she cannot walk across her home without stopping secondary short of breath.  Echo was done back in December 2017 and revealed only moderate mitral regurg was otherwise within normal limits with systolic ejection fraction 60%.  Otherwise there has been no change in status. No recent antibiotics.  There has been no recent illness, hospitalizations, travel or sick contacts.    Patient denies fevers/chills, weakness, dizziness, chest pain, N/V/C/D, abdominal pain, dysuria/frequency, changes in mental status.   ED Course: Patient rec'd Lasix, Duoneb, Solumedrol.  Patient does not have a diagnosis of COPD however she has not been evaluated by pulmonary and has never had PFTs.  Review of Systems:  CONSTITUTIONAL: No fever/chills, fatigue,  weakness, weight gain/loss, headache. EYES: No blurry or double vision. ENT: No tinnitus, postnasal drip, redness or soreness of the oropharynx. RESPIRATORY: Positive shortness of breath, dyspnea on exertion No cough,wheeze.  No hemoptysis.  CARDIOVASCULAR: No chest pain, palpitations, syncope, orthopnea. Positive lower extremity edema.  GASTROINTESTINAL: No nausea, vomiting, abdominal pain, diarrhea, constipation.  No hematemesis, melena or hematochezia. GENITOURINARY: No dysuria, frequency, hematuria. ENDOCRINE: No polyuria or nocturia. No heat or cold intolerance. HEMATOLOGY: No anemia, bruising, bleeding. INTEGUMENTARY: No rashes, ulcers, lesions. MUSCULOSKELETAL: No gout, dyspnea. Positive bilateral knee arthritis NEUROLOGIC: No numbness, tingling, ataxia, seizure-type activity, weakness. PSYCHIATRIC: No anxiety, depression, insomnia.   Past Medical History:  Diagnosis Date  . Anxiety   . Arthritis    R knee, L knee - OA  . Dysrhythmia    "mild arrythmia after taking Redux diet med years ago"no cardio fi  . Headache(784.0)   . Heart valve problem    was on Redux and it caused some "heart valve damage"-does not see cardiologist  . Hyperlipidemia   . Hypertension    echoJefm Bryant, wnl 2009? , had taken Redux for diet management  but now  off the market. pt, told that echo wnl.    . Recurrent upper respiratory infection (URI)    treated /w OTC med.   . Shortness of breath    with exertion    Past Surgical History:  Procedure Laterality Date  . ABDOMINAL HYSTERECTOMY     partial-uterus removed  . CHOLECYSTECTOMY     2011  . COCCYX REMOVAL     [facility]  . EYE SURGERY     states both lens replaced. Not sure if cataract surgery or not.  [name] HEEL  SPUR SURGERY    . I&D EXTREMITY  08/27/2011   Procedure: IRRIGATION AND DEBRIDEMENT EXTREMITY;  Surgeon: [name], MD;  Location: [place];  Service: Orthopedics;  Laterality: Right;  . JOINT REPLACEMENT Right 2012   Right  TKR  . KNEE ARTHROPLASTY  07/28/2011   Procedure: COMPUTER ASSISTED TOTAL KNEE ARTHROPLASTY;  Surgeon: [name], MD;  Location: [place];  Service: Orthopedics;  Laterality: Right;  right total knee arthroplasty, computer assisted  . KNEE ARTHROSCOPY     right knee  . TMJ ARTHROPLASTY     [place]     reports that she has been smoking.  She has a 25.00 pack-year smoking history. She has never used smokeless tobacco. She reports that she does not drink alcohol or use drugs.  Allergies  Allergen Reactions  . Bacitracin-Neomycin-Polymyxin Other (See Comments)  . Codeine Other (See Comments)    Itching "she can take some forms of it" Itching "she can take some forms of it"  . Gabapentin Nausea Only  . Neosporin  [Neomycin-Bacitracin Zn-Polymyx]   . Pregabalin Other (See Comments)    stomach issues stomach issues    Family History  Problem Relation Age of Onset  . Heart disease Mother   . Hypertension Mother   . Hypertension Father   . Heart disease Sister 71    stent   . Heart attack Sister   . Hyperlipidemia Sister   . Hypertension Sister   . Heart attack Maternal Uncle 64  . Heart disease Maternal Uncle     CABG   Family history has been reviewed and confirmed with patient.   Prior to Admission medications   Medication Sig Start Date End Date Taking? Authorizing Provider  alprazolam Duanne Moron) 2 MG tablet Take 1 tablet (2 mg total) by mouth 2 (two) times daily as needed for anxiety. 08/07/16   Roselee Nova, MD  atenolol (TENORMIN) 100 MG tablet TAKE 1 TABLET (100 MG TOTAL) BY MOUTH DAILY. 06/25/16   Roselee Nova, MD  cyclobenzaprine (FLEXERIL) 5 MG tablet TAKE ONE AND ONE-HALF TABLETS BY MOUTH EVERY NIGHT AT BEDTIME 07/08/16   Roselee Nova, MD  hydrOXYzine (ATARAX/VISTARIL) 25 MG tablet TAKE 1 TABLET BY MOUTH TWICE A DAY 06/08/16   Roselee Nova, MD  losartan (COZAAR) 50 MG tablet TAKE 1 TABLET (50 MG TOTAL) BY MOUTH DAILY. 04/29/16   Roselee Nova, MD   oxyCODONE (OXY IR/ROXICODONE) 5 MG immediate release tablet Take 1 tablet (5 mg total) by mouth every 8 (eight) hours as needed for moderate pain. 08/07/16   Roselee Nova, MD  simvastatin (ZOCOR) 40 MG tablet TAKE 1 TABLET (40 MG TOTAL) BY MOUTH DAILY AT 6 PM. 06/25/16   Roselee Nova, MD  traZODone (DESYREL) 150 MG tablet Take 1 tablet by mouth at bedtime. 10/13/13   Roselee Nova, MD    Physical Exam: Vitals:   08/27/16 1652 08/27/16 1700 08/27/16 1730 08/27/16 1836  BP:  (!) 148/74 (!) 143/82 (!) 149/72  Pulse:  67 65 71  Resp:  (!) 22 16 20   Temp: 98 F (36.7 C)     TempSrc: Oral     SpO2:  97% 94% 95%  Weight:      Height:        GENERAL: 61 y.o.-year-old Obese female patient, well-developed, well-nourished lying in the bed in no acute distress.  Pleasant and cooperative.   HEENT: Head  atraumatic, normocephalic. Pupils equal, round, reactive to light and accommodation. No scleral icterus. Extraocular muscles intact. Nares are patent. Oropharynx is clear. Mucus membranes moist. NECK: Supple, full range of motion. No JVD, no bruit heard. No thyroid enlargement, no tenderness, no cervical lymphadenopathy. CHEST: Mild expiratory wheezing. No use of accessory muscles of respiration.  No reproducible chest wall tenderness.  CARDIOVASCULAR: S1, S2 normal. 2/6 SEM appreciated Cap refill <2 seconds. Pulses intact distally.  ABDOMEN: Soft, nondistended, nontender. No rebound, guarding, rigidity. Normoactive bowel sounds present in all four quadrants. No organomegaly or mass. EXTREMITIES: Bilateral pitting edema to the midcalf. Chronic changes of vascular insufficiency. No cyanosis, or clubbing. No calf tenderness or Homan's sign. Hypertrophic knee joints.   NEUROLOGIC: The patient is alert and oriented x 3. Cranial nerves II through XII are grossly intact with no focal sensorimotor deficit. Muscle strength 5/5 in all extremities. Sensation intact. Gait not checked. PSYCHIATRIC:  Normal  affect, mood, thought content. SKIN: Warm, dry, and intact without obvious rash, lesion, or ulcer.    Labs on Admission:  CBC:  Recent Labs Lab 08/27/16 1659  WBC 5.6  HGB 11.8*  HCT 33.8*  MCV 121.0*  PLT 86*   Basic Metabolic Panel:  Recent Labs Lab 08/27/16 1659  NA 140  K 4.5  CL 110  CO2 24  GLUCOSE 94  BUN 9  CREATININE 0.38*  CALCIUM 8.4*   GFR: Estimated Creatinine Clearance: 107.6 mL/min (by C-G formula based on SCr of 0.38 mg/dL (L)). Liver Function Tests: No results for input(s): AST, ALT, ALKPHOS, BILITOT, PROT, ALBUMIN in the last 168 hours. No results for input(s): LIPASE, AMYLASE in the last 168 hours. No results for input(s): AMMONIA in the last 168 hours. Coagulation Profile: No results for input(s): INR, PROTIME in the last 168 hours. Cardiac Enzymes:  Recent Labs Lab 08/27/16 1659  TROPONINI <0.03   BNP (last 3 results) No results for input(s): PROBNP in the last 8760 hours. HbA1C: No results for input(s): HGBA1C in the last 72 hours. CBG: No results for input(s): GLUCAP in the last 168 hours. Lipid Profile: No results for input(s): CHOL, HDL, LDLCALC, TRIG, CHOLHDL, LDLDIRECT in the last 72 hours. Thyroid Function Tests: No results for input(s): TSH, T4TOTAL, FREET4, T3FREE, THYROIDAB in the last 72 hours. Anemia Panel: No results for input(s): VITAMINB12, FOLATE, FERRITIN, TIBC, IRON, RETICCTPCT in the last 72 hours. Urine analysis:    Component Value Date/Time   COLORURINE YELLOW 02/27/2016 1120   APPEARANCEUR CLEAR 02/27/2016 1120   APPEARANCEUR Clear 11/11/2012 1648   LABSPEC 1.005 02/27/2016 1120   LABSPEC 1.021 11/11/2012 1648   PHURINE 7.5 02/27/2016 1120   GLUCOSEU NEGATIVE 02/27/2016 1120   GLUCOSEU Negative 11/11/2012 1648   HGBUR NEGATIVE 02/27/2016 1120   BILIRUBINUR NEGATIVE 02/27/2016 1120   BILIRUBINUR Negative 11/11/2012 1648   KETONESUR NEGATIVE 02/27/2016 1120   PROTEINUR NEGATIVE 02/27/2016 1120    UROBILINOGEN 1.0 07/20/2011 1514   NITRITE NEGATIVE 02/27/2016 1120   LEUKOCYTESUR NEGATIVE 02/27/2016 1120   LEUKOCYTESUR Negative 11/11/2012 1648   Sepsis Labs: @LABRCNTIP (procalcitonin:4,lacticidven:4) )No results found for this or any previous visit (from the past 240 hour(s)).   Radiological Exams on Admission: Dg Chest 2 View  Result Date: 08/27/2016 CLINICAL DATA:  Acute onset of shortness of breath with exertion. Bilateral lower extremity edema. Initial encounter. EXAM: CHEST  2 VIEW COMPARISON:  Chest radiograph performed 04/29/2016 FINDINGS: The lungs are well-aerated. Mild vascular congestion is noted. Mild bibasilar opacities raise concern for interstitial edema.  A small right pleural effusion is noted. No pneumothorax is seen. The heart is mildly enlarged. No acute osseous abnormalities are identified. IMPRESSION: Mild vascular congestion and mild cardiomegaly. Bibasilar airspace opacities raise concern for mild interstitial edema. Electronically Signed   By: Garald Balding M.D.   On: 08/27/2016 17:47    EKG: Normal sinus rhythm at 68 bpm with normal axis, prolonged QT and nonspecific ST-T wave changes.   ECHO 12/17 ------------------------------------------------------------------- Study Conclusions  - Left ventricle: The cavity size was normal. Systolic function was   normal. The estimated ejection fraction was in the range of 60%   to 65%. Wall motion was normal; there were no regional wall   motion abnormalities. Left ventricular diastolic function   parameters were normal. - Mitral valve: There was moderate regurgitation. - Left atrium: The atrium was mildly dilated. - Right ventricle: Systolic function was normal. - Pulmonary arteries: Systolic pressure was mildly elevated. PA   peak pressure: 36 mm Hg (S).    Assessment/Plan  This is a 61 y.o. female with a history of  anxiety, OA, HTN, HLD, LE edema, tobacco dependence now being admitted with:  1.  SOB/DOE/LE edema secondary to new onset CHF, medication noncompliance - Admit inpatient telemetry monitoring. - Beta blocker, ARB, ordered.  Add diuretic, aspirin - Intake/output, daily weight. - Trend troponins, check lipids and TSH. - Repeat echo - Cardiology consultation requested.  VPatient had previously seen Dr. Yvone Neu - Patient advised to follow a low salt diet and instructed on how to gradually incorporate exercise.  - Medication compliance also strongly suggested.    3. Questionable COPD, and long-standing history of tobacco use however no formal diagnosis -O2 and the nausea therapy as needed -Patient instructed that she will likely need pulmonary workup as outpatient. -Solu-Medrol was given in the emergency department. Continue to monitor and consider a prednisone taper if patient has persistent short of breath and or wheezing.  2. Anemia, mild with macrocytosis - Check B12, folate -Monitor CBC  3. H/o HTN - Continue atenolol, losartan  4. H/O HLD - Continue Zocor  5. History of OA - Continue Flexeril, oxycodone  6. Tobacco use disorder -Tobacco cessation has been encouraged and education provided.   May continue Xanax, trazodone, hydroxyzine  Admission status: Inpatient, tele IV Fluids: HL Diet/Nutrition: HH Consults called: Cardio  DVT Px: Lovenox, SCDs and early ambulation. Code Status: Full Code  Disposition Plan: to home in 1-2 days   All the records are reviewed and case discussed with ED provider. Management plans discussed with the patient and her daughter-in-law who express understanding and agree with plan of care.  Kyheem Bathgate D.O. on 08/27/2016 at 7:13 PM Between 7am to 6pm - Pager - [phone] After 6pm go to [email or web address] - [employer]reader [facility] [place] Office [phone] CC: Primary care physician; Keith Rake, MD   08/27/2016, 7:13 PM

## 2016-08-27 NOTE — Progress Notes (Signed)
Name: Leah Shah   MRN: [id number]    DOB: 1955-09-15   Date:08/27/2016       Progress Note  Subjective  Chief Complaint  Chief Complaint  Patient presents with  . Medication Refill  . Anxiety    is gwetting worse due to home life    Shortness of Breath  This is a recurrent problem. The current episode started in the past 7 days. The problem has been gradually worsening. Associated symptoms include leg swelling. Pertinent negatives include no chest pain, fever, headaches, orthopnea, PND, rhinorrhea (nasal congestion), sore throat or wheezing. She has tried nothing for the symptoms. There is no history of COPD.     Past Medical History:  Diagnosis Date  . Anxiety   . Arthritis    R knee, L knee - OA  . Dysrhythmia    "mild arrythmia after taking Redux diet med years ago"no cardio fi  . Headache(784.0)   . Heart valve problem    was on Redux and it caused some "heart valve damage"-does not see cardiologist  . Hyperlipidemia   . Hypertension    echoJefm Bryant, wnl 2009? , had taken Redux for diet management  but now  off the market. pt, told that echo wnl.    . Recurrent upper respiratory infection (URI)    treated /w OTC med.   . Shortness of breath    with exertion    Past Surgical History:  Procedure Laterality Date  . ABDOMINAL HYSTERECTOMY     partial-uterus removed  . CHOLECYSTECTOMY     2011  . COCCYX REMOVAL     [facility]  . EYE SURGERY     states both lens replaced. Not sure if cataract surgery or not.  [name] HEEL SPUR SURGERY    . I&D EXTREMITY  08/27/2011   Procedure: IRRIGATION AND DEBRIDEMENT EXTREMITY;  Surgeon: [name], MD;  Location: [place];  Service: Orthopedics;  Laterality: Right;  . JOINT REPLACEMENT Right 2012   Right TKR  . KNEE ARTHROPLASTY  07/28/2011   Procedure: COMPUTER ASSISTED TOTAL KNEE ARTHROPLASTY;  Surgeon: [name], MD;  Location: [place];  Service: Orthopedics;  Laterality: Right;  right total knee arthroplasty,  computer assisted  . KNEE ARTHROSCOPY     right knee  . TMJ ARTHROPLASTY     [facility]- [facility]    Family History  Problem Relation Age of Onset  . Heart disease Mother   . Hypertension Mother   . Hypertension Father   . Heart disease Sister 35    stent   . Heart attack Sister   . Hyperlipidemia Sister   . Hypertension Sister   . Heart attack Maternal Uncle 64  . Heart disease Maternal Uncle     CABG    Social History   Social History  . Marital status: Married    Spouse name: N/A  . Number of children: N/A  . Years of education: N/A   Occupational History  . Not on file.   Social History Main Topics  . Smoking status: Current Every Day Smoker    Packs/day: 0.50    Years: 50.00  . Smokeless tobacco: Never Used  . Alcohol use No  . Drug use: No  . Sexual activity: Not Currently   Other Topics Concern  . Not on file   Social History Narrative  . No narrative on file     Current Outpatient Prescriptions:  .  alprazolam (XANAX) 2 MG tablet, Take  1 tablet (2 mg total) by mouth 2 (two) times daily as needed for anxiety., Disp: 60 tablet, Rfl: 0 .  atenolol (TENORMIN) 100 MG tablet, TAKE 1 TABLET (100 MG TOTAL) BY MOUTH DAILY., Disp: 90 tablet, Rfl: 0 .  cyclobenzaprine (FLEXERIL) 5 MG tablet, TAKE ONE AND ONE-HALF TABLETS BY MOUTH EVERY NIGHT AT BEDTIME, Disp: 45 tablet, Rfl: 2 .  hydrOXYzine (ATARAX/VISTARIL) 25 MG tablet, TAKE 1 TABLET BY MOUTH TWICE A DAY, Disp: 90 tablet, Rfl: 1 .  losartan (COZAAR) 50 MG tablet, TAKE 1 TABLET (50 MG TOTAL) BY MOUTH DAILY., Disp: 90 tablet, Rfl: 1 .  oxyCODONE (OXY IR/ROXICODONE) 5 MG immediate release tablet, Take 1 tablet (5 mg total) by mouth every 8 (eight) hours as needed for moderate pain., Disp: 90 tablet, Rfl: 0 .  simvastatin (ZOCOR) 40 MG tablet, TAKE 1 TABLET (40 MG TOTAL) BY MOUTH DAILY AT 6 PM., Disp: 90 tablet, Rfl: 0 .  traZODone (DESYREL) 150 MG tablet, Take 1 tablet by mouth at bedtime., Disp: , Rfl:   Allergies   Allergen Reactions  . Bacitracin-Neomycin-Polymyxin Other (See Comments)  . Codeine Other (See Comments)    Itching "she can take some forms of it" Itching "she can take some forms of it"  . Gabapentin Nausea Only  . Neosporin  [Neomycin-Bacitracin Zn-Polymyx]   . Pregabalin Other (See Comments)    stomach issues stomach issues     Review of Systems  Constitutional: Negative for fever.  HENT: Negative for rhinorrhea (nasal congestion) and sore throat.   Respiratory: Positive for shortness of breath. Negative for wheezing.   Cardiovascular: Positive for leg swelling. Negative for chest pain, orthopnea and PND.  Neurological: Negative for headaches.    Objective  Vitals:   08/27/16 1508  BP: 138/84  Pulse: 76  Resp: 18  Temp: 98.1 F (36.7 C)  TempSrc: Oral  SpO2: 92%  Weight: (!) 304 lb (137.9 kg)  Height: 5\' 8"  (1.727 m)    Physical Exam  Constitutional: She is oriented to person, place, and time and well-developed, well-nourished, and in no distress.  Cardiovascular: Normal rate, regular rhythm, S1 normal and S2 normal.   Murmur heard.  Systolic murmur is present with a grade of 2/6  Pulmonary/Chest: Effort normal. No respiratory distress. She has decreased breath sounds. She has wheezes in the left middle field and the left lower field.  Musculoskeletal:       Right ankle: She exhibits swelling. Tenderness.       Left ankle: She exhibits swelling. Tenderness.  Neurological: She is alert and oriented to person, place, and time.  Nursing note and vitals reviewed.       Assessment & Plan  1. Dyspnea on exertion Obtain workup to rule out cardiopulmonary etiology - DG Chest 2 View; Future - EKG 12-Lead - CBC with Differential/Platelet  2. Bilateral leg edema Worse, obtain ultrasound of bilateral lower extremities to rule out DVT  3. Atrial fibrillation, unspecified type ([place]) EKG shows new onset atrial fibrillation, with symptoms of dyspnea on exertion  and EKG findings, will refer patient to ER for further management. Patient proceeded to the ER via EMS.   Wallice Granville Asad A. [facility] 08/27/2016 3:41 PM

## 2016-08-27 NOTE — ED Triage Notes (Signed)
Patient from PCP via [facility]. Reports increasing SOB with exertion x1 week. Also reports increasing bilateral lower edema for the last week. Patient denies history of CHF or COPD. EKG done at PCP shows afib which patient denies history of. EMS EKG shows NSR. Patient denies pain. A&Ox4.

## 2016-08-27 NOTE — ED Notes (Signed)
Patient given dinner tray. Informed we are waiting on admission orders and bed placement. Admitting MD paged to request orders be placed.

## 2016-08-27 NOTE — ED Notes (Signed)
Patient sitting with family. Room delay explained. Drink given to patient. No further needs expressed.

## 2016-08-27 NOTE — ED Provider Notes (Signed)
[facility] Emergency Department Provider Note   ____________________________________________    I have reviewed the triage vital signs and the nursing notes.   HISTORY  Chief Complaint Shortness of Breath     HPI Leah Shah is a 61 y.o. female who presents with complaints of shortness of breath. Patient reports over the last week she has had gradually worsening shortness of breath. She denies chest pain but does report some mild chest tightness. She does have a long smoking history. She reports family members and COPD but she has never been diagnosed. She denies fevers or chills or cough. No recent travel. She does have some bilateral lower swelling but she states it is not worse than usual. She has never felt short of breath like this before.   Past Medical History:  Diagnosis Date  . Anxiety   . Arthritis    R knee, L knee - OA  . Dysrhythmia    "mild arrythmia after taking Redux diet med years ago"no cardio fi  . Headache(784.0)   . Heart valve problem    was on Redux and it caused some "heart valve damage"-does not see cardiologist  . Hyperlipidemia   . Hypertension    echoJefm Bryant, wnl 2009? , had taken Redux for diet management  but now  off the market. pt, told that echo wnl.    . Recurrent upper respiratory infection (URI)    treated /w OTC med.   . Shortness of breath    with exertion    Patient Active Problem List   Diagnosis Date Noted  . Respiratory crackles at right lung base 04/29/2016  . Acute confusion following injury ([place]) 02/24/2016  . Hyperglycemia 12/12/2015  . Bilateral swelling of feet and ankles 11/13/2015  . Dyslipidemia 06/16/2015  . MI (mitral incompetence) 06/16/2015  . Acute right hip pain 05/16/2015  . Left lateral knee pain 04/22/2015  . Abnormal LFTs 04/15/2015  . BP (high blood pressure) 04/15/2015  . Reduced mobility 04/15/2015  . Chronic urticaria 04/15/2015  . Hyperlipidemia 03/14/2015  .  Chronic pain of left knee 01/31/2015  . Gonalgia 01/10/2015  . Clinical depression 01/10/2015  . Benign hypertension 01/10/2015  . Cannot sleep 01/10/2015  . Current tobacco use 01/10/2015  . Chronic low back pain without sciatica 12/13/2014  . Continuous opioid dependence ([place]) 12/13/2014  . Anxiety 12/13/2014  . Cervical spine pain 12/13/2014  . Pain and swelling of left knee 12/13/2014    Past Surgical History:  Procedure Laterality Date  . ABDOMINAL HYSTERECTOMY     partial-uterus removed  . CHOLECYSTECTOMY     2011  . CO[facility]YX REMOVAL     [facility]  . EYE SURGERY     states both lens replaced. Not sure if cataract surgery or not.  [name] HEEL SPUR SURGERY    . I&D EXTREMITY  08/27/2011   Procedure: IRRIGATION AND DEBRIDEMENT EXTREMITY;  Surgeon: [name], MD;  Location: [place];  Service: Orthopedics;  Laterality: Right;  . JOINT REPLACEMENT Right 2012   Right TKR  . KNEE ARTHROPLASTY  07/28/2011   Procedure: COMPUTER ASSISTED TOTAL KNEE ARTHROPLASTY;  Surgeon: [name], MD;  Location: [place];  Service: Orthopedics;  Laterality: Right;  right total knee arthroplasty, computer assisted  . KNEE ARTHROSCOPY     right knee  . TMJ ARTHROPLASTY     [place]    Prior to Admission medications   Medication Sig Start Date End Date Taking?  Authorizing Provider  alprazolam Duanne Moron) 2 MG tablet Take 1 tablet (2 mg total) by mouth 2 (two) times daily as needed for anxiety. 08/07/16   Roselee Nova, MD  atenolol (TENORMIN) 100 MG tablet TAKE 1 TABLET (100 MG TOTAL) BY MOUTH DAILY. 06/25/16   Roselee Nova, MD  cyclobenzaprine (FLEXERIL) 5 MG tablet TAKE ONE AND ONE-HALF TABLETS BY MOUTH EVERY NIGHT AT BEDTIME 07/08/16   Roselee Nova, MD  hydrOXYzine (ATARAX/VISTARIL) 25 MG tablet TAKE 1 TABLET BY MOUTH TWICE A DAY 06/08/16   Roselee Nova, MD  losartan (COZAAR) 50 MG tablet TAKE 1 TABLET (50 MG TOTAL) BY MOUTH DAILY. 04/29/16   Roselee Nova, MD  oxyCODONE (OXY  IR/ROXICODONE) 5 MG immediate release tablet Take 1 tablet (5 mg total) by mouth every 8 (eight) hours as needed for moderate pain. 08/07/16   Roselee Nova, MD  simvastatin (ZOCOR) 40 MG tablet TAKE 1 TABLET (40 MG TOTAL) BY MOUTH DAILY AT 6 PM. 06/25/16   Roselee Nova, MD  traZODone (DESYREL) 150 MG tablet Take 1 tablet by mouth at bedtime. 10/13/13   Roselee Nova, MD     Allergies Bacitracin-neomycin-polymyxin; Codeine; Gabapentin; Neosporin  [neomycin-bacitracin zn-polymyx]; and Pregabalin  Family History  Problem Relation Age of Onset  . Heart disease Mother   . Hypertension Mother   . Hypertension Father   . Heart disease Sister 36    stent   . Heart attack Sister   . Hyperlipidemia Sister   . Hypertension Sister   . Heart attack Maternal Uncle 64  . Heart disease Maternal Uncle     CABG    Social History Social History  Substance Use Topics  . Smoking status: Current Every Day Smoker    Packs/day: 0.50    Years: 50.00  . Smokeless tobacco: Never Used  . Alcohol use No    Review of Systems  Constitutional: No fever/chills Eyes: No visual changes.   Cardiovascular: Denies chest pain. Respiratory: As above Gastrointestinal: No abdominal pain.  No nausea, no vomiting.    Musculoskeletal: Negative for back pain. Skin: Negative for rash. Neurological: Negative for headaches   10-point ROS otherwise negative.  ____________________________________________   PHYSICAL EXAM:  VITAL SIGNS: ED Triage Vitals  Enc Vitals Group     BP 08/27/16 1650 (!) 147/69     Pulse Rate 08/27/16 1650 79     Resp 08/27/16 1650 19     Temp 08/27/16 1652 98 F (36.7 C)     Temp Source 08/27/16 1652 Oral     SpO2 08/27/16 1650 97 %     Weight 08/27/16 1650 (!) 304 lb (137.9 kg)     Height 08/27/16 1650 5\' 8"  (1.727 m)     Head Circumference --      Peak Flow --      Pain Score --      Pain Loc --      Pain Edu? --      Excl. in [place]? --     Constitutional: Alert  and oriented. No acute distress. Pleasant and interactive Eyes: Conjunctivae are normal.   Nose: No congestion/rhinnorhea. Mouth/Throat: Mucous membranes are moist.    Cardiovascular: Normal rate, regular rhythm. Grossly normal heart sounds.  Good peripheral circulation. Respiratory: Normal respiratory effort.  No retractions. Wheezing bilaterally, mild to moderate Gastrointestinal: Soft and nontender. No distention.  No CVA tenderness. Genitourinary: deferred Musculoskeletal: No lower extremity  tenderness nor edema.  Warm and well perfused Neurologic:  Normal speech and language. No gross focal neurologic deficits are appreciated.  Skin:  Skin is warm, dry and intact. No rash noted. Psychiatric: Mood and affect are normal. Speech and behavior are normal.  ____________________________________________   LABS (all labs ordered are listed, but only abnormal results are displayed)  Labs Reviewed  BASIC METABOLIC PANEL - Abnormal; Notable for the following:       Result Value   Creatinine, Ser 0.38 (*)    Calcium 8.4 (*)    All other components within normal limits  CBC - Abnormal; Notable for the following:    RBC 2.79 (*)    Hemoglobin 11.8 (*)    HCT 33.8 (*)    MCV 121.0 (*)    MCH 42.2 (*)    RDW 17.7 (*)    Platelets 86 (*)    All other components within normal limits  BRAIN NATRIURETIC PEPTIDE - Abnormal; Notable for the following:    B Natriuretic Peptide 317.0 (*)    All other components within normal limits  TROPONIN I   ____________________________________________  EKG  ED ECG REPORT I, Lavonia Drafts, the attending physician, personally viewed and interpreted this ECG.  Date: 08/27/2016  Rate: 69 Rhythm: normal sinus rhythm QRS Axis: normal Intervals: normal ST/T Wave abnormalities: normal Conduction Disturbances: Prolonged QT Narrative Interpretation: unremarkable  ____________________________________________  RADIOLOGY  Chest x-ray  pending ____________________________________________   PROCEDURES  Procedure(s) performed: No    Critical Care performed: No ____________________________________________   INITIAL IMPRESSION / ASSESSMENT AND PLAN / ED COURSE  Pertinent labs & imaging results that were available during my care of the patient were reviewed by me and considered in my medical decision making (see chart for details).  Patient presents with complaints of shortness of breath. Exam is concerning for wheezing bilaterally. We will treat with DuoNeb and possibly Solu-Medrol but also investigate for other causes such as pulmonary edema/pneumonia    Chest x-ray concerning for pulmonary edema. Nurses notified me that the patient has required oxygen pulse ox are in high 80's/low 90's without O2. IV lasix ordered. Will admit for further management   ____________________________________________   FINAL CLINICAL IMPRESSION(S) / ED DIAGNOSES  Final diagnoses:  COPD exacerbation ([place])  Acute congestive heart failure, unspecified congestive heart failure type ([place])      NEW MEDICATIONS STARTED DURING THIS VISIT:  New Prescriptions   No medications on file     Note:  This document was prepared using Dragon voice recognition software and may include unintentional dictation errors.    Lavonia Drafts, MD 08/27/16 L[place]line Hare

## 2016-08-28 ENCOUNTER — Inpatient Hospital Stay (HOSPITAL_COMMUNITY)
Admit: 2016-08-28 | Discharge: 2016-08-28 | Disposition: A | Discharge status: Home / Self Care | Payer: Medicare HMO | Attending: Internal Medicine | Admitting: Internal Medicine

## 2016-08-28 ENCOUNTER — Inpatient Hospital Stay: Payer: Medicare HMO

## 2016-08-28 ENCOUNTER — Encounter: Payer: Self-pay | Admitting: Nurse Practitioner

## 2016-08-28 DIAGNOSIS — I509 Heart failure, unspecified: Secondary | ICD-10-CM

## 2016-08-28 DIAGNOSIS — I5033 Acute on chronic diastolic (congestive) heart failure: Secondary | ICD-10-CM

## 2016-08-28 DIAGNOSIS — E668 Other obesity: Secondary | ICD-10-CM

## 2016-08-28 DIAGNOSIS — J432 Centrilobular emphysema: Secondary | ICD-10-CM

## 2016-08-28 DIAGNOSIS — I110 Hypertensive heart disease with heart failure: Secondary | ICD-10-CM

## 2016-08-28 DIAGNOSIS — I5032 Chronic diastolic (congestive) heart failure: Secondary | ICD-10-CM

## 2016-08-28 LAB — LIPID PANEL
Cholesterol: 111 mg/dL (ref 0–200)
HDL: 33 mg/dL — AB (ref >40)
LDL Cholesterol: 64 mg/dL (ref 0–99)
TRIGLYCERIDES: 70 mg/dL (ref <150)
Total CHOL/HDL Ratio: 3.4 RATIO
VLDL: 14 mg/dL (ref 0–40)

## 2016-08-28 LAB — TROPONIN I
Troponin I: <0.03 ng/mL (ref <0.03)
Troponin I: <0.03 ng/mL (ref <0.03)

## 2016-08-28 LAB — BASIC METABOLIC PANEL
Anion gap: 5 (ref 5–15)
BUN: 12 mg/dL (ref 6–20)
CHLORIDE: 112 mmol/L — AB (ref 101–111)
CO2: 26 mmol/L (ref 22–32)
Calcium: 8.3 mg/dL — ABNORMAL LOW (ref 8.9–10.3)
Creatinine, Ser: 0.53 mg/dL (ref 0.44–1.00)
GFR calc Af Amer: >60 mL/min (ref >60)
GFR calc non Af Amer: >60 mL/min (ref >60)
GLUCOSE: 225 mg/dL — AB (ref 65–99)
POTASSIUM: 4 mmol/L (ref 3.5–5.1)
Sodium: 143 mmol/L (ref 135–145)

## 2016-08-28 LAB — ECHOCARDIOGRAM COMPLETE
HEIGHTINCHES: 68 in
WEIGHTICAEL: 4865.6 [oz_av]

## 2016-08-28 LAB — VITAMIN B12: VITAMIN B 12: 1422 pg/mL — AB (ref 180–914)

## 2016-08-28 LAB — FOLATE: Folate: 13.4 ng/mL (ref >5.9)

## 2016-08-28 LAB — TSH: TSH: 0.766 u[IU]/mL (ref 0.350–4.500)

## 2016-08-28 LAB — MAGNESIUM: MAGNESIUM: 1.4 mg/dL — AB (ref 1.7–2.4)

## 2016-08-28 MED ORDER — PREDNISONE 20 MG PO TABS
20.0000 mg | ORAL_TABLET | Freq: Every day | ORAL | Status: DC
  Administered 2016-08-28 – 2016-08-29 (×2): 20 mg via ORAL
  Filled 2016-08-28 (×2): qty 1

## 2016-08-28 MED ORDER — BUDESONIDE 0.5 MG/2ML IN SUSP
0.5000 mg | Freq: Two times a day (BID) | RESPIRATORY_TRACT | Status: DC
  Administered 2016-08-28 – 2016-08-29 (×3): 0.5 mg via RESPIRATORY_TRACT
  Filled 2016-08-28 (×3): qty 2

## 2016-08-28 MED ORDER — POTASSIUM CHLORIDE CRYS ER 20 MEQ PO TBCR
20.0000 meq | EXTENDED_RELEASE_TABLET | Freq: Two times a day (BID) | ORAL | Status: DC
  Administered 2016-08-28 – 2016-08-29 (×2): 20 meq via ORAL
  Filled 2016-08-28 (×2): qty 1

## 2016-08-28 MED ORDER — FUROSEMIDE 10 MG/ML IJ SOLN
40.0000 mg | Freq: Two times a day (BID) | INTRAMUSCULAR | Status: DC
  Administered 2016-08-28 – 2016-08-29 (×2): 40 mg via INTRAVENOUS
  Filled 2016-08-28 (×2): qty 4

## 2016-08-28 MED ORDER — IPRATROPIUM-ALBUTEROL 0.5-2.5 (3) MG/3ML IN SOLN
3.0000 mL | Freq: Four times a day (QID) | RESPIRATORY_TRACT | Status: DC
  Administered 2016-08-28 – 2016-08-29 (×4): 3 mL via RESPIRATORY_TRACT
  Filled 2016-08-28 (×4): qty 3

## 2016-08-28 MED ORDER — MAGNESIUM SULFATE 2 GM/50ML IV SOLN
2.0000 g | Freq: Once | INTRAVENOUS | Status: AC
  Administered 2016-08-28: 2 g via INTRAVENOUS
  Filled 2016-08-28: qty 50

## 2016-08-28 NOTE — Care Management (Signed)
Admitted with what appears to be new onset CHF and atrial fib. She is not requiring oxygen.  She stopped taking her lasix prescribed about one month ago due to urinary frequency.   Independent in all adls, denies issues accessing medical care, obtaining medications or with transportation.  Current with her PCP.  Patient has been give heart failure written education and plan is for patient to watch heart failure videos.  Referral has been made to heart failure clinic.  made dietician referral one on one diet education especially as it relates to sodium. [facility] referral

## 2016-08-28 NOTE — Progress Notes (Signed)
*  PRELIMINARY RESULTS* Echocardiogram 2D Echocardiogram has been performed.  Sherrie Sport 08/28/2016, 8:48 AM

## 2016-08-28 NOTE — Progress Notes (Signed)
Patient ID: Leah Shah, female   DOB: 07-02-1956, 61 y.o.   MRN: [id number]  [employer] PROGRESS NOTE  Leah Shah [id number] DOB: 1956/07/06 DOA: 08/27/2016 PCP: Keith Rake, MD  HPI/Subjective: Patient feeling better than when she came in. Came in with shortness of breath. Some cough.  Objective: Vitals:   08/28/16 0901 08/28/16 1130  BP: 127/62 130/68  Pulse: 67 69  Resp: 18 18  Temp: 97.8 F (36.6 C) 97.8 F (36.6 C)    Filed Weights   08/27/16 1650 08/27/16 2308  Weight: (!) 137.9 kg (304 lb) (!) 137.9 kg (304 lb 1.6 oz)    ROS: Review of Systems  Constitutional: Negative for chills and fever.  Eyes: Negative for blurred vision.  Respiratory: Positive for cough and shortness of breath.   Cardiovascular: Negative for chest pain.  Gastrointestinal: Negative for abdominal pain, constipation, diarrhea, nausea and vomiting.  Genitourinary: Negative for dysuria.  Musculoskeletal: Positive for joint pain.  Neurological: Negative for dizziness and headaches.   Exam: Physical Exam  Constitutional: She is oriented to person, place, and time.  HENT:  Nose: No mucosal edema.  Mouth/Throat: No oropharyngeal exudate or posterior oropharyngeal edema.  Eyes: Conjunctivae, EOM and lids are normal. Pupils are equal, round, and reactive to light.  Neck: No JVD present. Carotid bruit is not present. No edema present. No thyroid mass and no thyromegaly present.  Cardiovascular: S1 normal and S2 normal.  Exam reveals no gallop.   No murmur heard. Pulses:      Dorsalis pedis pulses are 2+ on the right side, and 2+ on the left side.  Respiratory: No respiratory distress. She has decreased breath sounds in the right lower field and the left lower field. She has wheezes in the right middle field, the left middle field and the left lower field. She has no rhonchi. She has no rales.  GI: Soft. Bowel sounds are normal. There is no tenderness.  Musculoskeletal:       Right ankle:  She exhibits swelling.       Left ankle: She exhibits swelling.  Lymphadenopathy:    She has no cervical adenopathy.  Neurological: She is alert and oriented to person, place, and time. No cranial nerve deficit.  Skin: Skin is warm. No rash noted. Nails show no clubbing.  Psychiatric: She has a normal mood and affect.      Data Reviewed: Basic Metabolic Panel:  Recent Labs Lab 08/27/16 1659 08/27/16 2337 08/28/16 0507  NA 140  --  143  K 4.5  --  4.0  CL 110  --  112*  CO2 24  --  26  GLUCOSE 94  --  225*  BUN 9  --  12  CREATININE 0.38*  --  0.53  CALCIUM 8.4*  --  8.3*  MG  --  1.4*  --    CBC:  Recent Labs Lab 08/27/16 1659  WBC 5.6  HGB 11.8*  HCT 33.8*  MCV 121.0*  PLT 86*   Cardiac Enzymes:  Recent Labs Lab 08/27/16 1659 08/27/16 2337 08/28/16 0507 08/28/16 1131  TROPONINI <0.03 <0.03 <0.03 <0.03   BNP (last 3 results)  Recent Labs  04/29/16 1510 08/27/16 1748  BNP 156.9* 317.0*      Studies: Dg Chest 2 View  Result Date: 08/27/2016 CLINICAL DATA:  Acute onset of shortness of breath with exertion. Bilateral lower extremity edema. Initial encounter. EXAM: CHEST  2 VIEW COMPARISON:  Chest radiograph performed 04/29/2016 FINDINGS: The lungs  are well-aerated. Mild vascular congestion is noted. Mild bibasilar opacities raise concern for interstitial edema. A small right pleural effusion is noted. No pneumothorax is seen. The heart is mildly enlarged. No acute osseous abnormalities are identified. IMPRESSION: Mild vascular congestion and mild cardiomegaly. Bibasilar airspace opacities raise concern for mild interstitial edema. Electronically Signed   By: Garald Balding M.D.   On: 08/27/2016 17:47    Scheduled Meds: . aspirin EC  81 mg Oral Daily  . atenolol  100 mg Oral Daily  . budesonide (PULMICORT) nebulizer solution  0.5 mg Nebulization BID  . enoxaparin (LOVENOX) injection  40 mg Subcutaneous BID  . furosemide  40 mg Intravenous BID  .  hydrOXYzine  25 mg Oral BID  . ipratropium-albuterol  3 mL Nebulization Q6H  . losartan  50 mg Oral Daily  . predniSONE  20 mg Oral Q breakfast  . simvastatin  40 mg Oral q1800  . sodium chloride flush  3 mL Intravenous Q12H    Assessment/Plan:  1. Acute diastolic congestive heart failure moderate mitral regurgitation with anasarca. Cardiology increase Lasix to 40 mg IV twice a day. Patient is on atenolol and losartan 2. COPD exacerbation. Start prednisone. Add nebulizer treatments DuoNeb and budesonide 3. Morbid obesity. Weight loss needed 4. Thrombocytopenia. Check hepatitis C 5. Essential hypertension on losartan and atenolol 6. Hyperlipidemia unspecified on simvastatin 7. Tobacco abuse. Smoking cessation done 4 minutes by me  Code Status:     Code Status Orders        Start     Ordered   08/27/16 2308  Full code  Continuous     08/27/16 2309    Code Status History    Date Active Date Inactive Code Status Order ID Comments User Context   08/27/2011  9:08 PM 08/28/2011  1:39 PM Full Code [id number]  Lodema Hong, RN Inpatient   07/28/2011  3:28 PM 08/01/2011  6:16 PM Full Code [id number]  Aniceto Boss, RN Inpatient     Disposition Plan: Potentially home over the weekend  Consultants:  Cardiology  Time spent: 28 minutes  Loletha Grayer  [employer]

## 2016-08-28 NOTE — Progress Notes (Signed)
Patient needed an Advance Directives. Chaplain provided an Regulatory affairs officer and education to patient.

## 2016-08-28 NOTE — Plan of Care (Signed)
Problem: Activity: Goal: Capacity to carry out activities will improve Outcome: Progressing Patient able to ambulate to the bathroom independently with cane, which is patient's baseline at home. Will continue to encourage ambulation.

## 2016-08-28 NOTE — Progress Notes (Signed)
Initial HF clinic appointment scheduled on September 09, 2016 at 9:00am Thank you.

## 2016-08-28 NOTE — Consult Note (Signed)
Cardiology Consult    Patient ID: Leah Shah MRN: [id number], DOB/AGE: 12-26-1955   Admit date: 08/27/2016 Date of Consult: 08/28/2016  Primary Physician: Keith Rake, MD Primary Cardiologist: A. Yvone Neu, MD  Requesting Provider: R. Wieting, MD  Patient Profile    61 y/o ? with a h/o obesity, tob abuse, HTN, HL, and diast CHF, who was admitted 2/22 with a one week h/o progressive dyspnea and edema in the setting of lasix and dietary noncompliance.  Past Medical History   Past Medical History:  Diagnosis Date  . Anxiety   . Arthritis    R knee, L knee - OA  . Chronic diastolic CHF (congestive heart failure) ([place])    a. 06/2016 Echo: EF 60-65%, no rwma, mod MR, mildly dil LA, nl RV fxn, PASP 58mmHg.  [name] Dysrhythmia    "mild arrythmia after taking Redux diet med years ago"no cardio fi  . Headache(784.0)   . History of stress test    a. 06/2016 MV: EF 67%, no ischemia/infarct.  . Hyperlipidemia   . Hypertension    echoJefm Bryant, wnl 2009? , had taken Redux for diet management  but now  off the market. pt, told that echo wnl.    . Moderate mitral regurgitation    a. 06/2016 Echo: EF 60-65%, mod MR.  . Morbid obesity ([place])   . Osteoarthritis   . Recurrent upper respiratory infection (URI)    treated /w OTC med.     Past Surgical History:  Procedure Laterality Date  . ABDOMINAL HYSTERECTOMY     partial-uterus removed  . CHOLECYSTECTOMY     2011  . COCCYX REMOVAL     [facility]  . EYE SURGERY     states both lens replaced. Not sure if cataract surgery or not.  [name] HEEL SPUR SURGERY    . I&D EXTREMITY  08/27/2011   Procedure: IRRIGATION AND DEBRIDEMENT EXTREMITY;  Surgeon: [name], MD;  Location: [facility];  Service: Orthopedics;  Laterality: Right;  . JOINT REPLACEMENT Right 2012   Right TKR  . KNEE ARTHROPLASTY  07/28/2011   Procedure: COMPUTER ASSISTED TOTAL KNEE ARTHROPLASTY;  Surgeon: [name], MD;  Location: [facility];  Service: Orthopedics;  Laterality:  Right;  right total knee arthroplasty, computer assisted  . KNEE ARTHROSCOPY     right knee  . TMJ ARTHROPLASTY     [facility]- 1997     Allergies  Allergies  Allergen Reactions  . Bacitracin-Neomycin-Polymyxin Other (See Comments)  . Codeine Other (See Comments)    Itching "she can take some forms of it" Itching "she can take some forms of it"  . Gabapentin Nausea Only  . Neosporin  [Neomycin-Bacitracin Zn-Polymyx]   . Pregabalin Other (See Comments)    stomach issues stomach issues    History of Present Illness    61 y/o ? with the above complex PMH including morbid obesity, HTN, HL, OA, anxiety, tob abuse, and diastolic CHF.  She was evaluated by Dr. Yvone Neu in late 2017 secondary to progressive dyspnea and edema.  She was placed on lasix 20 mg daily.  Echo showed nl LV fxn, mod MR, and PAH (0000000 systolic).  She did note wt loss on lasix with improved breathing and edema, but about a week ago, was tired of the urinary freq associated with lasix rx and decided to stop it.  Shortly thereafter, she began to note increasing DOE, lower ext edema, incr abd girth, and early satiety.  She has chronic  2 pillow orthopnea, which has not changed.  She went to see her PCP yesterday and was noted to be up 4 lbs.  She later presented to the ED for eval.  There, bnp wsa mildly elevated @ 317.  troponins were nl.  ECG non-acute.  CXR with interstitial edema.  She was admitted and placed on lasix 40 iv daily.  She is minus 600 ml since admission.  Breathing somewhat better.  No chest pain.  Inpatient Medications    . aspirin EC  81 mg Oral Daily  . atenolol  100 mg Oral Daily  . enoxaparin (LOVENOX) injection  40 mg Subcutaneous BID  . furosemide  40 mg Intravenous Daily  . hydrOXYzine  25 mg Oral BID  . losartan  50 mg Oral Daily  . magnesium sulfate 1 - 4 g bolus IVPB  2 g Intravenous Once  . simvastatin  40 mg Oral q1800  . sodium chloride flush  3 mL Intravenous Q12H    Family History      Family History  Problem Relation Age of Onset  . Heart disease Mother   . Hypertension Mother   . Hypertension Father   . Heart disease Sister 13    stent   . Heart attack Sister   . Hyperlipidemia Sister   . Hypertension Sister   . Heart attack Maternal Uncle 64  . Heart disease Maternal Uncle     CABG    Social History    Social History   Social History  . Marital status: Married    Spouse name: N/A  . Number of children: N/A  . Years of education: N/A   Occupational History  . Not on file.   Social History Main Topics  . Smoking status: Current Every Day Smoker    Packs/day: 0.50    Years: 50.00  . Smokeless tobacco: Never Used  . Alcohol use No  . Drug use: No  . Sexual activity: Not Currently   Other Topics Concern  . Not on file   Social History Narrative   Lives in [place] with husband.  Does not routinely exercise.     Review of Systems    General:  No chills, fever, night sweats or weight changes.  Cardiovascular:  No chest pain, +++ dyspnea on exertion, +++ edema, +++ orthopnea, no palpitations, paroxysmal nocturnal dyspnea. Dermatological: No rash, lesions/masses Respiratory: No cough, +++ dyspnea Urologic: No hematuria, dysuria Abdominal:   No nausea, vomiting, diarrhea, bright red blood per rectum, melena, or hematemesis Neurologic:  No visual changes, wkns, changes in mental status. All other systems reviewed and are otherwise negative except as noted above.  Physical Exam    Blood pressure 127/62, pulse 67, temperature 97.8 F (36.6 C), temperature source Oral, resp. rate 18, height 5\' 8"  (1.727 m), weight (!) 304 lb 1.6 oz (137.9 kg), SpO2 97 %.  General: Pleasant, obese, NAD Psych: Normal affect. Neuro: Alert and oriented X 3. Moves all extremities spontaneously. HEENT: Normal  Neck: Supple without bruits.  JVP to jaw. Lungs:  Resp regular and unlabored, diminished breath sounds bilat with scatt rhonchi and faint exp  wheezing. Heart: RRR, distant, no s3, s4, or murmurs. Abdomen: Obese, Soft, non-tender, non-distended, BS + x 4.  Extremities: No clubbing, cyanosis.  2+ bilat LE edema to mid calf. DP/PT/Radials 1+ and equal bilaterally.  Labs     Recent Labs  08/27/16 1659 08/27/16 2337 08/28/16 0507  TROPONINI <0.03 <0.03 <0.03   Lab Results  Component Value Date   WBC 5.6 08/27/2016   HGB 11.8 (L) 08/27/2016   HCT 33.8 (L) 08/27/2016   MCV 121.0 (H) 08/27/2016   PLT 86 (L) 08/27/2016    Recent Labs Lab 08/28/16 0507  NA 143  K 4.0  CL 112*  CO2 26  BUN 12  CREATININE 0.53  CALCIUM 8.3*  GLUCOSE 225*   Lab Results  Component Value Date   CHOL 111 08/28/2016   HDL 33 (L) 08/28/2016   LDLCALC 64 08/28/2016   TRIG 70 08/28/2016    Radiology Studies    Dg Chest 2 View  Result Date: 08/27/2016 CLINICAL DATA:  Acute onset of shortness of breath with exertion. Bilateral lower extremity edema. Initial encounter. EXAM: CHEST  2 VIEW COMPARISON:  Chest radiograph performed 04/29/2016 FINDINGS: The lungs are well-aerated. Mild vascular congestion is noted. Mild bibasilar opacities raise concern for interstitial edema. A small right pleural effusion is noted. No pneumothorax is seen. The heart is mildly enlarged. No acute osseous abnormalities are identified. IMPRESSION: Mild vascular congestion and mild cardiomegaly. Bibasilar airspace opacities raise concern for mild interstitial edema. Electronically Signed   By: Garald Balding M.D.   On: 08/27/2016 17:47    ECG & Cardiac Imaging    RSR, 68, no acute st/t changes.  Assessment & Plan    1.  Acute on chornic diastolic CHF:  Pt [facility] to [facility] on 2/22 with a one week h/o progressive DOE, lower ext edema, inc abd girth, and early satiety.  Notably she was prev eval for similar Ss with nl LV fxn on echo, mod MR.  Nuc study was also undertaken and was non-ischemic.  She was placed on lasix 20 mg daily and took it with good response, but  stopped it about a wk ago b/c of urinary frequency.  She is now volume overloaded.  Wt up 4 lbs since last wt in system. She has responded well to IV lasix.  Still w/ JVD and lower ext edema.  Will increase lasix to 40 bid for today.  She also admits to dietary noncompliance.  Often eats fast food and also adds "light" salt to food.  We discussed the importance of daily weights, sodium restriction, medication compliance, and symptom reporting and she verbalizes understanding.  HR/BP stable.  Cont  blocker/ARB.  2.  Hypertensive Heart Dzs w/ CHF:  bp stable on  blocker and ARB.  3.  HL:  LDL 64 on statin.  4.  Moderate MR:  F/u as outpt.  5.  Morbid Obesity:   Would benefit from extensive nutritional counseling as outpt.  Signed, Murray Hodgkins, NP 08/28/2016, 10:11 AM    Attending Note Patient seen and examined, agree with detailed note above,  Patient presentation and plan discussed on rounds.  Cardiology consult placed by Dr.  Ara Kussmaul for acute on chronic diastolic CHF  EKG lab work, chest x-ray, echocardiogram reviewed independently by myself  61 year old woman with morbid obesity, history of diastolic CHF, dietary noncompliance who presents with worsening shortness of breath, leg swelling over the past week or so. As detailed above, noncompliant with her Lasix, high fluid intake Was told by [employer] to drink lots of water Lots of fast food, [employer] lemonade large drink sitting next to her hospital bed  She reports that she did call primary care physician, they did not increase her Lasix and in fact she was not taking it secondary to being bothered by frequent urination  On physical exam morbidly obese,  unable to estimate JVP, lungs with crackles at the bases bilaterally, heart sounds regular with normal S1-S2 no murmurs appreciated, abdomen soft nontender, obese, pitting lower extremity edema 2+ around the feet, 1+ below the knees bilaterally  Lab work reviewed  showing BNP 317, magnesium 1.4, normal renal function, normal TSH  EKG reviewed by myself showing no sinus rhythm rate 68 bpm no significant ST or T-wave changes  --- Acute on chronic diastolic CHF Dietary and fluid non-discretion Long discussion with her, CHF education provided Discussed the importance of limiting her fluid intake, limiting salt intake, fast food Stressed importance of weighing daily, adjusting her Lasix based on her weight Suggested that she will likely need Lasix daily at higher dose than she is currently taking, with extra Lasix for more than 3 pound weight gain -Suggested follow-up with CHF clinic for close monitoring  -----Morbid obesity Stressed importance of low carbohydrate diet  -----Hypertensive heart disease Continue blood pressure medications as detailed above-   Greater than 50% was spent in counseling and coordination of care with patient Total encounter time 110 minutes or more   Signed: Esmond Plants  M.D., Ph.D. [facility]

## 2016-08-29 DIAGNOSIS — Z23 Encounter for immunization: Secondary | ICD-10-CM | POA: Diagnosis not present

## 2016-08-29 LAB — BASIC METABOLIC PANEL
Anion gap: 3 — ABNORMAL LOW (ref 5–15)
BUN: 23 mg/dL — ABNORMAL HIGH (ref 6–20)
CALCIUM: 8.4 mg/dL — AB (ref 8.9–10.3)
CO2: 26 mmol/L (ref 22–32)
Chloride: 109 mmol/L (ref 101–111)
Creatinine, Ser: 0.54 mg/dL (ref 0.44–1.00)
GFR calc Af Amer: >60 mL/min (ref >60)
GLUCOSE: 162 mg/dL — AB (ref 65–99)
POTASSIUM: 3.8 mmol/L (ref 3.5–5.1)
Sodium: 138 mmol/L (ref 135–145)

## 2016-08-29 LAB — HIV ANTIBODY (ROUTINE TESTING W REFLEX): HIV Screen 4th Generation wRfx: NONREACTIVE

## 2016-08-29 MED ORDER — PREDNISONE 5 MG PO TABS: ORAL_TABLET | ORAL | 0 refills | Status: DC

## 2016-08-29 MED ORDER — POTASSIUM CHLORIDE CRYS ER 20 MEQ PO TBCR: 20.0000 meq | EXTENDED_RELEASE_TABLET | Freq: Every day | ORAL | 0 refills | Status: DC

## 2016-08-29 MED ORDER — ASPIRIN 81 MG PO TBEC: 81.0000 mg | DELAYED_RELEASE_TABLET | Freq: Every day | ORAL | 0 refills | Status: DC

## 2016-08-29 MED ORDER — BECLOMETHASONE DIPROPIONATE 40 MCG/ACT IN AERS: 1.0000 | INHALATION_SPRAY | Freq: Two times a day (BID) | RESPIRATORY_TRACT | 0 refills | Status: DC

## 2016-08-29 MED ORDER — ALBUTEROL SULFATE HFA 108 (90 BASE) MCG/ACT IN AERS: 2.0000 | INHALATION_SPRAY | Freq: Four times a day (QID) | RESPIRATORY_TRACT | 0 refills | Status: AC | PRN

## 2016-08-29 MED ORDER — ASPIRIN 81 MG PO TBEC: 81.0000 mg | DELAYED_RELEASE_TABLET | Freq: Every day | ORAL | 0 refills | Status: AC

## 2016-08-29 MED ORDER — FUROSEMIDE 40 MG PO TABS: 40.0000 mg | ORAL_TABLET | Freq: Every day | ORAL | 0 refills | Status: DC

## 2016-08-29 MED ORDER — POTASSIUM CHLORIDE CRYS ER 20 MEQ PO TBCR: 20.0000 meq | EXTENDED_RELEASE_TABLET | Freq: Two times a day (BID) | ORAL | 0 refills | Status: DC

## 2016-08-29 MED ORDER — ALBUTEROL SULFATE HFA 108 (90 BASE) MCG/ACT IN AERS: 2.0000 | INHALATION_SPRAY | Freq: Four times a day (QID) | RESPIRATORY_TRACT | 0 refills | Status: DC | PRN

## 2016-08-29 MED ORDER — FUROSEMIDE 40 MG PO TABS: 40.0000 mg | ORAL_TABLET | Freq: Two times a day (BID) | ORAL | 0 refills | Status: DC

## 2016-08-29 NOTE — Discharge Instructions (Signed)
Heart Failure Heart failure means your heart has trouble pumping blood. This makes it hard for your body to work well. Heart failure is usually a long-term (chronic) condition. You must take good care of yourself and follow your doctor's treatment plan. HOME CARE  Take your heart medicine as told by your doctor.  Do not stop taking medicine unless your doctor tells you to.  Do not skip any dose of medicine.  Refill your medicines before they run out.  Take other medicines only as told by your doctor or pharmacist.  Stay active if told by your doctor. The elderly and people with severe heart failure should talk with a doctor about physical activity.  Eat heart-healthy foods. Choose foods that are without trans fat and are low in saturated fat, cholesterol, and salt (sodium). This includes fresh or frozen fruits and vegetables, fish, lean meats, fat-free or low-fat dairy foods, whole grains, and high-fiber foods. Lentils and dried peas and beans (legumes) are also good choices.  Limit salt if told by your doctor.  Cook in a healthy way. Roast, grill, broil, bake, poach, steam, or stir-fry foods.  Limit fluids as told by your doctor.  Weigh yourself every morning. Do this after you pee (urinate) and before you eat breakfast. Write down your weight to give to your doctor.  Take your blood pressure and write it down if your doctor tells you to.  Ask your doctor how to check your pulse. Check your pulse as told.  Lose weight if told by your doctor.  Stop smoking or chewing tobacco. Do not use gum or patches that help you quit without your doctor's approval.  Schedule and go to doctor visits as told.  Nonpregnant women should have no more than 1 drink a day. Men should have no more than 2 drinks a day. Talk to your doctor about drinking alcohol.  Stop illegal drug use.  Stay current with shots (immunizations).  Manage your health conditions as told by your doctor.  Learn to  manage your stress.  Rest when you are tired.  If it is really hot outside:  Avoid intense activities.  Use air conditioning or fans, or get in a cooler place.  Avoid caffeine and alcohol.  Wear loose-fitting, lightweight, and light-colored clothing.  If it is really cold outside:  Avoid intense activities.  Layer your clothing.  Wear mittens or gloves, a hat, and a scarf when going outside.  Avoid alcohol.  Learn about heart failure and get support as needed.  Get help to maintain or improve your quality of life and your ability to care for yourself as needed. GET HELP IF:   You gain weight quickly.  You are more short of breath than usual.  You cannot do your normal activities.  You tire easily.  You cough more than normal, especially with activity.  You have any or more puffiness (swelling) in areas such as your hands, feet, ankles, or belly (abdomen).  You cannot sleep because it is hard to breathe.  You feel like your heart is beating fast (palpitations).  You get dizzy or light-headed when you stand up. GET HELP RIGHT AWAY IF:   You have trouble breathing.  There is a change in mental status, such as becoming less alert or not being able to focus.  You have chest pain or discomfort.  You faint. MAKE SURE YOU:   Understand these instructions.  Will watch your condition.  Will get help right away if  you are not doing well or get worse. This information is not intended to replace advice given to you by your health care provider. Make sure you discuss any questions you have with your health care provider. Document Released: 03/31/2008 Document Revised: 07/13/2014 Document Reviewed: 08/08/2012 Elsevier Interactive Patient Education  2017 Elsevier Inc.   Chronic Obstructive Pulmonary Disease Exacerbation Chronic obstructive pulmonary disease (COPD) is a common lung condition in which airflow from the lungs is limited. COPD is a general term that  can be used to describe many different lung problems that limit airflow, including chronic bronchitis and emphysema. COPD exacerbations are episodes when breathing symptoms become much worse and require extra treatment. Without treatment, COPD exacerbations can be life threatening, and frequent COPD exacerbations can cause further damage to your lungs. What are the causes?  Respiratory infections.  Exposure to smoke.  Exposure to air pollution, chemical fumes, or dust. Sometimes there is no apparent cause or trigger. What increases the risk?  Smoking cigarettes.  Older age.  Frequent prior COPD exacerbations. What are the signs or symptoms?  Increased coughing.  Increased thick spit (sputum) production.  Increased wheezing.  Increased shortness of breath.  Rapid breathing.  Chest tightness. How is this diagnosed? Your medical history, a physical exam, and tests will help your health care provider make a diagnosis. Tests may include:  A chest X-ray.  Basic lab tests.  Sputum testing.  An arterial blood gas test. How is this treated? Depending on the severity of your COPD exacerbation, you may need to be admitted to a hospital for treatment. Some of the treatments commonly used to treat COPD exacerbations are:  Antibiotic medicines.  Bronchodilators. These are drugs that expand the air passages. They may be given with an inhaler or nebulizer. Spacer devices may be needed to help improve drug delivery.  Corticosteroid medicines.  Supplemental oxygen therapy.  Airway clearing techniques, such as noninvasive ventilation (NIV) and positive expiratory pressure (PEP). These provide respiratory support through a mask or other noninvasive device. Follow these instructions at home:  Do not smoke. Quitting smoking is very important to prevent COPD from getting worse and exacerbations from happening as often.  Avoid exposure to all substances that irritate the airway,  especially to tobacco smoke.  If you were prescribed an antibiotic medicine, finish it all even if you start to feel better.  Take all medicines as directed by your health care provider.It is important to use correct technique with inhaled medicines.  Drink enough fluids to keep your urine clear or pale yellow (unless you have a medical condition that requires fluid restriction).  Use a cool mist vaporizer. This makes it easier to clear your chest when you cough.  If you have a home nebulizer and oxygen, continue to use them as directed.  Maintain all necessary vaccinations to prevent infections.  Exercise regularly.  Eat a healthy diet.  Keep all follow-up appointments as directed by your health care provider. Get help right away if:  You have worsening shortness of breath.  You have trouble talking.  You have severe chest pain.  You have blood in your sputum.  You have a fever.  You have weakness, vomit repeatedly, or faint.  You feel confused.  You continue to get worse. This information is not intended to replace advice given to you by your health care provider. Make sure you discuss any questions you have with your health care provider. Document Released: 04/19/2007 Document Revised: 11/28/2015 Document Reviewed: 02/24/2013 Elsevier  Interactive Patient Education  2017 [facility] appointment on September 09, 2016 at 9:00am with Darylene Price, [place]. Please call [phone] to reschedule.

## 2016-08-29 NOTE — Discharge Summary (Signed)
[facility] [place] NAME: Leah Shah    MR#:  [id number]  DATE OF BIRTH:  Jun 14, 1956  DATE OF ADMISSION:  08/27/2016 ADMITTING PHYSICIAN: Harvie Bridge, DO  DATE OF DISCHARGE: 08/29/2016 12:22 PM  PRIMARY CARE PHYSICIAN: Keith Rake, MD    ADMISSION DIAGNOSIS:  COPD exacerbation ([place]) [J44.1] Acute congestive heart failure, unspecified congestive heart failure type ([place]) [I50.9]  DISCHARGE DIAGNOSIS:  Active Problems:   Congestive heart failure (CHF) (HCC)   Centrilobular emphysema (HCC)   Acute on chronic diastolic CHF (congestive heart failure) ([place])   Morbid obesity ([place])   Hypertensive heart disease with heart failure ([place])   SECONDARY DIAGNOSIS:   Past Medical History:  Diagnosis Date  . Anxiety   . Arthritis    R knee, L knee - OA  . Chronic diastolic CHF (congestive heart failure) ([place])    a. 06/2016 Echo: EF 60-65%, no rwma, mod MR, mildly dil LA, nl RV fxn, PASP 43mmHg.  [name] Dysrhythmia    "mild arrythmia after taking Redux diet med years ago"no cardio fi  . Headache(784.0)   . History of stress test    a. 06/2016 MV: EF 67%, no ischemia/infarct.  . Hyperlipidemia   . Hypertension    echoJefm Bryant, wnl 2009? , had taken Redux for diet management  but now  off the market. pt, told that echo wnl.    . Moderate mitral regurgitation    a. 06/2016 Echo: EF 60-65%, mod MR.  . Morbid obesity ([place])   . Osteoarthritis   . Recurrent upper respiratory infection (URI)    treated /w OTC med.     HOSPITAL COURSE:   1.  Acute diastolic congestive heart failure with moderate mitral regurgitation with anasarca. Patient was diuresed in the hospital with Lasix 40 mg IV twice a day. The patient will be switched over to Lasix 40 mg orally twice a day at home. Patient is already on atenolol and losartan. Potassium supplementation also given. Follow-up with cardiology as outpatient within 1 week. Follow up at the CHF clinic. 2. COPD  exacerbation and tobacco abuse. Quick prednisone taper. In the hospital we gave nebulizer treatments. At home I'll prescribe Qvar and albuterol inhaler. Patient must stop smoking. 3. Morbid obesity. Weight loss needed. 4. Thrombocytopenia.  hepatitis C screen sent off but still pending 5. Essential hypertension on losartan and atenolol 6. Hyperlipidemia unspecified on simvastatin 7. Tobacco abuse. Patient must stop smoking. 8. Exercise options can swim if she likes swimming in the pool or bike riding in order to try to lose weight. 9. Hypomagnesemia patient given IV magnesium yesterday  DISCHARGE CONDITIONS:   Fair  CONSULTS OBTAINED:  Treatment Team:  Minna Merritts, MD  DRUG ALLERGIES:   Allergies  Allergen Reactions  . Bacitracin-Neomycin-Polymyxin Other (See Comments)  . Codeine Other (See Comments)    Itching "she can take some forms of it" Itching "she can take some forms of it"  . Gabapentin Nausea Only  . Neosporin  [Neomycin-Bacitracin Zn-Polymyx]   . Pregabalin Other (See Comments)    stomach issues stomach issues    DISCHARGE MEDICATIONS:   Discharge Medication List as of 08/29/2016 11:16 AM    START taking these medications   Details  beclomethasone (QVAR) 40 MCG/ACT inhaler Inhale 1 puff into the lungs 2 (two) times daily., Starting Sat 08/29/2016, Print    predniSONE (DELTASONE) 5 MG tablet 2 tabs po day1; 1 tab po day2,3, Print  CONTINUE these medications which have CHANGED   Details  albuterol (PROVENTIL HFA;VENTOLIN HFA) 108 (90 Base) MCG/ACT inhaler Inhale 2 puffs into the lungs every 6 (six) hours as needed for wheezing or shortness of breath., Starting Sat 08/29/2016, Print    aspirin 81 MG EC tablet Take 1 tablet (81 mg total) by mouth daily., Starting Sun 08/30/2016, Print    furosemide (LASIX) 40 MG tablet Take 1 tablet (40 mg total) by mouth 2 (two) times daily., Starting Sat 08/29/2016, Print    potassium chloride SA (K-DUR,KLOR-CON) 20 MEQ  tablet Take 1 tablet (20 mEq total) by mouth 2 (two) times daily., Starting Sat 08/29/2016, Print      CONTINUE these medications which have NOT CHANGED   Details  alprazolam (XANAX) 2 MG tablet Take 1 tablet (2 mg total) by mouth 2 (two) times daily as needed for anxiety., Starting Fri 08/07/2016, Print    atenolol (TENORMIN) 100 MG tablet TAKE 1 TABLET (100 MG TOTAL) BY MOUTH DAILY., Starting Thu 06/25/2016, Normal    cyclobenzaprine (FLEXERIL) 5 MG tablet TAKE ONE AND ONE-HALF TABLETS BY MOUTH EVERY NIGHT AT BEDTIME, Normal    losartan (COZAAR) 50 MG tablet TAKE 1 TABLET (50 MG TOTAL) BY MOUTH DAILY., Normal    oxyCODONE (OXY IR/ROXICODONE) 5 MG immediate release tablet Take 1 tablet (5 mg total) by mouth every 8 (eight) hours as needed for moderate pain., Starting Fri 08/07/2016, Print    simvastatin (ZOCOR) 40 MG tablet TAKE 1 TABLET (40 MG TOTAL) BY MOUTH DAILY AT 6 PM., Starting Thu 06/25/2016, Normal    traZODone (DESYREL) 150 MG tablet Take 1 tablet by mouth at bedtime., Starting Fri 10/13/2013, Historical Med    hydrOXYzine (ATARAX/VISTARIL) 25 MG tablet TAKE 1 TABLET BY MOUTH TWICE A DAY, Normal         DISCHARGE INSTRUCTIONS:   Follow up cardiology 1 week Follow-up PMD 2 weeks  If you experience worsening of your admission symptoms, develop shortness of breath, life threatening emergency, suicidal or homicidal thoughts you must seek medical attention immediately by calling 911 or calling your MD immediately  if symptoms less severe.  You Must read complete instructions/literature along with all the possible adverse reactions/side effects for all the Medicines you take and that have been prescribed to you. Take any new Medicines after you have completely understood and accept all the possible adverse reactions/side effects.   Please note  You were cared for by a hospitalist during your hospital stay. If you have any questions about your discharge medications or the care you  received while you were in the hospital after you are discharged, you can call the unit and asked to speak with the hospitalist on call if the hospitalist that took care of you is not available. Once you are discharged, your primary care physician will handle any further medical issues. Please note that NO REFILLS for any discharge medications will be authorized once you are discharged, as it is imperative that you return to your primary care physician (or establish a relationship with a primary care physician if you do not have one) for your aftercare needs so that they can reassess your need for medications and monitor your lab values.    Today   CHIEF COMPLAINT:   Chief Complaint  Patient presents with  . Shortness of Breath    HISTORY OF PRESENT ILLNESS:  Leah Shah  is a 61 y.o. female presented with shortness of breath   VITAL SIGNS:  Blood pressure [name])  123/50, pulse 74, temperature 98.1 F (36.7 C), temperature source Oral, resp. rate 18, height 5\' 8"  (1.727 m), weight (!) 136.7 kg (301 lb 4.8 oz), SpO2 95 %.   PHYSICAL EXAMINATION:  GENERAL:  61 y.o.-year-old patient lying in the bed with no acute distress.  EYES: Pupils equal, round, reactive to light and accommodation. No scleral icterus. Extraocular muscles intact.  HEENT: Head atraumatic, normocephalic. Oropharynx and nasopharynx clear.  NECK:  Supple, no jugular venous distention. No thyroid enlargement, no tenderness.  LUNGS: Normal breath sounds bilaterally, no wheezing, rales,rhonchi or crepitation. No use of accessory muscles of respiration.  CARDIOVASCULAR: S1, S2 normal. No murmurs, rubs, or gallops.  ABDOMEN: Soft, non-tender, non-distended. Bowel sounds present. No organomegaly or mass.  EXTREMITIES: 3+ edema, no cyanosis, or clubbing.  NEUROLOGIC: Cranial nerves II through XII are intact. Muscle strength 5/5 in all extremities. Sensation intact. Gait not checked.  PSYCHIATRIC: The patient is alert and oriented x  3.  SKIN: No obvious rash, lesion, or ulcer.   DATA REVIEW:   CBC  Recent Labs Lab 08/27/16 1659  WBC 5.6  HGB 11.8*  HCT 33.8*  PLT 86*    Chemistries   Recent Labs Lab 08/27/16 2337  08/29/16 0540  NA  --   < > 138  K  --   < > 3.8  CL  --   < > 109  CO2  --   < > 26  GLUCOSE  --   < > 162*  BUN  --   < > 23*  CREATININE  --   < > 0.54  CALCIUM  --   < > 8.4*  MG 1.4*  --   --   < > = values in this interval not displayed.  Cardiac Enzymes  Recent Labs Lab 08/28/16 1131  TROPONINI <0.03    Microbiology Results  Results for orders placed or performed in visit on 02/27/16  Urine culture     Status: None   Collection Time: 02/27/16 12:00 AM  Result Value Ref Range Status   Organism ID, Bacteria Three or more organisms present,each greater than  Final   Organism ID, Bacteria 10,000 CFU/mL.These organisms,commonly found on  Final   Organism ID, Bacteria external and internal genitalia,are considered to  Final   Organism ID, Bacteria be colonizers.No further testing performed.  Final    RADIOLOGY:  Dg Chest 2 View  Result Date: 08/27/2016 CLINICAL DATA:  Acute onset of shortness of breath with exertion. Bilateral lower extremity edema. Initial encounter. EXAM: CHEST  2 VIEW COMPARISON:  Chest radiograph performed 04/29/2016 FINDINGS: The lungs are well-aerated. Mild vascular congestion is noted. Mild bibasilar opacities raise concern for interstitial edema. A small right pleural effusion is noted. No pneumothorax is seen. The heart is mildly enlarged. No acute osseous abnormalities are identified. IMPRESSION: Mild vascular congestion and mild cardiomegaly. Bibasilar airspace opacities raise concern for mild interstitial edema. Electronically Signed   By: Garald Balding M.D.   On: 08/27/2016 17:47     Management plans discussed with the patient, family and they are in agreement.  CODE STATUS:     Code Status Orders        Start     Ordered   08/27/16  2308  Full code  Continuous     08/27/16 2309    Code Status History    Date Active Date Inactive Code Status Order ID Comments User Context   08/27/2011  9:08 PM 08/28/2011  1:39 PM  Full Code [id number]  Lodema Hong, RN Inpatient   07/28/2011  3:28 PM 08/01/2011  6:16 PM Full Code [id number]  Aniceto Boss, RN Inpatient      TOTAL TIME TAKING CARE OF THIS PATIENT: 35 minutes.    Loletha Grayer M.D on 08/29/2016 at 2:36 PM  Between 7am to 6pm - Pager - [phone]  After 6pm go to [email or web address] - password [email or web address] [facility]  [phone]  CC: Primary care physician; Keith Rake, MD

## 2016-08-29 NOTE — Progress Notes (Signed)
Progress Note  Patient Name: Leah Shah Date of Encounter: 08/29/2016  Primary Cardiologist: Yvone Neu  Subjective   Denies chest pain or sob. "I am back to normal"  Inpatient Medications    Scheduled Meds: . aspirin EC  81 mg Oral Daily  . atenolol  100 mg Oral Daily  . budesonide (PULMICORT) nebulizer solution  0.5 mg Nebulization BID  . enoxaparin (LOVENOX) injection  40 mg Subcutaneous BID  . furosemide  40 mg Intravenous BID  . hydrOXYzine  25 mg Oral BID  . ipratropium-albuterol  3 mL Nebulization Q6H  . losartan  50 mg Oral Daily  . potassium chloride  20 mEq Oral BID  . predniSONE  20 mg Oral Q breakfast  . simvastatin  40 mg Oral q1800  . sodium chloride flush  3 mL Intravenous Q12H   Continuous Infusions:  PRN Meds: sodium chloride, acetaminophen, ALPRAZolam, ondansetron (ZOFRAN) IV, oxyCODONE, sodium chloride flush   Vital Signs    Vitals:   08/28/16 2033 08/29/16 0111 08/29/16 0536 08/29/16 0626  BP: 104/60  (!) 99/45   Pulse: 73  75   Resp: 16  20   Temp: 98 F (36.7 C)  97.8 F (36.6 C)   TempSrc: Oral  Oral   SpO2: 94% 95% 94%   Weight:    (!) 301 lb 4.8 oz (136.7 kg)  Height:        Intake/Output Summary (Last 24 hours) at 08/29/16 0750 Last data filed at 08/28/16 2204  Gross per 24 hour  Intake                0 ml  Output             1850 ml  Net            -1850 ml   Filed Weights   08/27/16 1650 08/27/16 2308 08/29/16 0626  Weight: (!) 304 lb (137.9 kg) (!) 304 lb 1.6 oz (137.9 kg) (!) 301 lb 4.8 oz (136.7 kg)    Telemetry    nsr - Personally Reviewed   Physical Exam   GEN: obese 61 yo woman, No acute distress.   Neck: 6 cm JVD Cardiac: RRR, no murmurs, rubs, or gallops.  Respiratory: Clear to auscultation bilaterally. GI: Soft, nontender, non-distended  MS: No edema; No deformity. Neuro:  Nonfocal  Psych: Normal affect   Labs    Chemistry Recent Labs Lab 08/27/16 1659 08/28/16 0507 08/29/16 0540  NA 140 143 138    K 4.5 4.0 3.8  CL 110 112* 109  CO2 24 26 26   GLUCOSE 94 225* 162*  BUN 9 12 23*  CREATININE 0.38* 0.53 0.54  CALCIUM 8.4* 8.3* 8.4*  GFRNONAA >60 >60 >60  GFRAA >60 >60 >60  ANIONGAP 6 5 3*     Hematology Recent Labs Lab 08/27/16 1659  WBC 5.6  RBC 2.79*  HGB 11.8*  HCT 33.8*  MCV 121.0*  MCH 42.2*  MCHC 34.8  RDW 17.7*  PLT 86*    Cardiac Enzymes Recent Labs Lab 08/27/16 1659 08/27/16 2337 08/28/16 0507 08/28/16 1131  TROPONINI <0.03 <0.03 <0.03 <0.03   No results for input(s): TROPIPOC in the last 168 hours.   BNP Recent Labs Lab 08/27/16 1748  BNP 317.0*     DDimer No results for input(s): DDIMER in the last 168 hours.   Radiology    Dg Chest 2 View  Result Date: 08/27/2016 CLINICAL DATA:  Acute onset of shortness of breath  with exertion. Bilateral lower extremity edema. Initial encounter. EXAM: CHEST  2 VIEW COMPARISON:  Chest radiograph performed 04/29/2016 FINDINGS: The lungs are well-aerated. Mild vascular congestion is noted. Mild bibasilar opacities raise concern for interstitial edema. A small right pleural effusion is noted. No pneumothorax is seen. The heart is mildly enlarged. No acute osseous abnormalities are identified. IMPRESSION: Mild vascular congestion and mild cardiomegaly. Bibasilar airspace opacities raise concern for mild interstitial edema. Electronically Signed   By: Garald Balding M.D.   On: 08/27/2016 17:47      Patient Profile     61 y.o. female with history of HTN, HL, obesity, tobaccoa use and diastolic CHF  Admitted [id number] with progressive dyspnea and edema in setting of noncompliance  Assessment & Plan    1  Acute on chronic diastlic CHF - she appears to be euvolemic. I think she is probably stable for DC home. She will need early followup in our office for transition of care. A low sodium diet will be important for her. She can have her diuretic switch to po at current dosing.  2  HTN - her blood pressure is well  controlled.   3  Obesity - she is encouraged to lose weight  4. Tobacco abuse - she has not smoked any more and plans not to start back.   5  Mod MR  F/U as outpt  Signed, Cristopher Peru, MD  08/29/2016, 7:50 AM

## 2016-08-29 NOTE — Progress Notes (Signed)
Patient discharged home as ordered,instructions explained and well understood,vital signs within normal limits upon discharge,escorted by spouse and Engineer, manufacturing via wheelchair.

## 2016-08-30 LAB — HEPATITIS C ANTIBODY: HCV Ab: <0.1 s/co ratio (ref 0.0–0.9)

## 2016-08-31 ENCOUNTER — Other Ambulatory Visit: Payer: Self-pay | Admitting: *Deleted

## 2016-08-31 NOTE — Patient Outreach (Signed)
[facility]) Care Management  08/31/2016  Leah Shah [birth year] [id number]   Referral received from care management assistant via inpatient case manager.  Member was admitted to hospital 2/22, discharged 2/24 for COPD exacerbation and heart failure.  Per chart, she also has history of hypertension, atrial fibrillation, and hyperlipidemia.    Call placed to member's listed number [phone]).  No answer, HIPAA compliant voice message left.  Will await call back, if no call back, will follow up tomorrow.  Valente David, [place], MSN [place] [phone]

## 2016-09-01 ENCOUNTER — Ambulatory Visit: Payer: Self-pay | Admitting: *Deleted

## 2016-09-01 ENCOUNTER — Telehealth: Payer: Self-pay | Admitting: Emergency Medicine

## 2016-09-01 ENCOUNTER — Telehealth: Payer: Self-pay | Admitting: Cardiology

## 2016-09-01 NOTE — Telephone Encounter (Signed)
Please schedule patient for hospital follow-up visit, will review her discharge medications, she should also ask her insurance company regarding alternative to Qvar that they will cover. We will refill her prescriptions at the time of office visit.

## 2016-09-01 NOTE — Telephone Encounter (Signed)
Patient was recently dc from [facility] .  Hospitalist rx Qvar and she cannot afford with what insurance is going to pay.  Patient says insurance company is suggesting changing to a nebulizer.  Please call patient .   Patient aware that she may need to contact hospitalist office.  Given contact information to reach.

## 2016-09-01 NOTE — Telephone Encounter (Signed)
Would like to pick up her script for Alprazolam and Oxycodone tomorrow.  Whilr in hospital was put on Qvar and insurance will not cover. Need new script for something else. Would also like script for Nebulizer machine.

## 2016-09-01 NOTE — Telephone Encounter (Signed)
Spoke with patient and instructed her to check with her primary care physician regarding her inhalers. She verbalized understanding and had no further questions at this time.

## 2016-09-02 ENCOUNTER — Telehealth: Payer: Self-pay | Admitting: Family Medicine

## 2016-09-02 ENCOUNTER — Other Ambulatory Visit: Payer: Self-pay

## 2016-09-02 NOTE — Telephone Encounter (Signed)
appt made for 09-08-16

## 2016-09-02 NOTE — Patient Outreach (Signed)
[facility]) Care Management  09/02/2016  Leah Shah [birth year] [id number]     Transition of Care Referral  Referral Date: 09/01/16 Referral Source: [facility] Liaison Date of Discharge: 08/29/16 Facility: [place]: [place] attempt # 1 to patient. No answer at present. RN CM left HIPAA compliant voicemail message along with contact info.  Plan: RN CM will make outreach attempt to patient within one business day if no return call from patient.   Enzo Montgomery, RN,BSN,CCM [place] Management Telephonic Care Management Coordinator Direct Phone: [phone] Toll Free: [phone] Fax: [phone]

## 2016-09-02 NOTE — Telephone Encounter (Signed)
Pt have upcoming appt for 09-08-16. She is asking for a prescription for nebulizer machine. She spoke with insurance company and they told her to get her pcp to order it. Would like it sent to [facility] (P) [phone] or (P) [phone] It has to be the one that plugs into the wall not the battery operated one. Her insurance will cover the nebulizer machine and the medication that goes in it. The insurance company did suggest that she do the nebulizer and not qvar.

## 2016-09-02 NOTE — Telephone Encounter (Signed)
Please make follow up appointment

## 2016-09-03 ENCOUNTER — Other Ambulatory Visit: Payer: Self-pay

## 2016-09-03 NOTE — Patient Outreach (Signed)
[facility]) Care Management  09/03/2016  Leah Shah Nov 27, 1955 [id number]   Transition of Care Referral  Referral Date: 09/01/16 Referral Source: [facility] Liaison Date of Discharge: 08/29/16 Facility: [place]: [employer]   Outreach attempt #2 to patient. No answer and present and unable to leave voicemail message.     Plan: RN CM will attempt outreach call to patient within one business day if no return call from patient.   Enzo Montgomery, RN,BSN,CCM [place] Management Telephonic Care Management Coordinator Direct Phone: [phone] Toll Free: [phone] Fax: [phone]

## 2016-09-04 ENCOUNTER — Ambulatory Visit: Payer: Medicare HMO | Admitting: Family Medicine

## 2016-09-04 ENCOUNTER — Other Ambulatory Visit: Payer: Self-pay

## 2016-09-04 NOTE — Patient Outreach (Signed)
[facility]) Care Management  09/04/2016  Leah Shah 1955-11-20 [id number]   Transition of Care Referral  Referral Date: 09/01/16 Referral Source: [facility] Liaison Date of Discharge: 08/29/16 Facility: [place] attempt #3 to patient. No answer.    Plan: RN CM will send unsuccessful outreach letter to patient and close case if no response from patient within 10 business days.    Enzo Montgomery, RN,BSN,CCM [place] Management Telephonic Care Management Coordinator Direct Phone: [phone] Toll Free: [phone] Fax: [phone]

## 2016-09-04 NOTE — Telephone Encounter (Signed)
Please schedule patient for hospital follow-up appointment and we can discuss starting her on the nebulizer. Until then, she should continue to use Qvar as it was prescribed upon hospital discharge.

## 2016-09-05 ENCOUNTER — Other Ambulatory Visit: Payer: Self-pay | Admitting: Family Medicine

## 2016-09-05 DIAGNOSIS — Z872 Personal history of diseases of the skin and subcutaneous tissue: Secondary | ICD-10-CM

## 2016-09-07 NOTE — Telephone Encounter (Signed)
Her pharmacy should be able to tell if any alternatives to Qvar are covered and she can let us know, will start on the appropriate inhaler.

## 2016-09-07 NOTE — Telephone Encounter (Signed)
Left voice message for pt to return call.

## 2016-09-07 NOTE — Telephone Encounter (Signed)
Pt already have appointment scheduled to see you. She states that the pharmacy would not fill qvar because it is not covered under her insurance. States at this time she is taking albuterol.

## 2016-09-08 ENCOUNTER — Ambulatory Visit (INDEPENDENT_AMBULATORY_CARE_PROVIDER_SITE_OTHER): Payer: Medicare HMO | Admitting: Family Medicine

## 2016-09-08 ENCOUNTER — Encounter: Payer: Self-pay | Admitting: Family Medicine

## 2016-09-08 ENCOUNTER — Ambulatory Visit (INDEPENDENT_AMBULATORY_CARE_PROVIDER_SITE_OTHER): Payer: Medicare HMO | Admitting: Cardiology

## 2016-09-08 ENCOUNTER — Encounter: Payer: Self-pay | Admitting: Cardiology

## 2016-09-08 VITALS — BP 100/64 | HR 70 | Ht 68.0 in | Wt 291.5 lb

## 2016-09-08 DIAGNOSIS — I10 Essential (primary) hypertension: Secondary | ICD-10-CM | POA: Diagnosis not present

## 2016-09-08 DIAGNOSIS — M545 Low back pain, unspecified: Secondary | ICD-10-CM

## 2016-09-08 DIAGNOSIS — Z6841 Body Mass Index (BMI) 40.0 and over, adult: Secondary | ICD-10-CM | POA: Diagnosis not present

## 2016-09-08 DIAGNOSIS — E7849 Other hyperlipidemia: Secondary | ICD-10-CM

## 2016-09-08 DIAGNOSIS — F419 Anxiety disorder, unspecified: Secondary | ICD-10-CM | POA: Diagnosis not present

## 2016-09-08 DIAGNOSIS — I5032 Chronic diastolic (congestive) heart failure: Secondary | ICD-10-CM

## 2016-09-08 DIAGNOSIS — I509 Heart failure, unspecified: Secondary | ICD-10-CM | POA: Diagnosis not present

## 2016-09-08 DIAGNOSIS — E6609 Other obesity due to excess calories: Secondary | ICD-10-CM | POA: Diagnosis not present

## 2016-09-08 DIAGNOSIS — G8929 Other chronic pain: Secondary | ICD-10-CM | POA: Diagnosis not present

## 2016-09-08 DIAGNOSIS — J441 Chronic obstructive pulmonary disease with (acute) exacerbation: Secondary | ICD-10-CM | POA: Diagnosis not present

## 2016-09-08 DIAGNOSIS — E784 Other hyperlipidemia: Secondary | ICD-10-CM

## 2016-09-08 DIAGNOSIS — Z872 Personal history of diseases of the skin and subcutaneous tissue: Secondary | ICD-10-CM | POA: Diagnosis not present

## 2016-09-08 DIAGNOSIS — IMO0001 Reserved for inherently not codable concepts without codable children: Secondary | ICD-10-CM

## 2016-09-08 MED ORDER — FLUTICASONE FUROATE-VILANTEROL 100-25 MCG/INH IN AEPB: 1.0000 | INHALATION_SPRAY | Freq: Every day | RESPIRATORY_TRACT | 0 refills | Status: AC

## 2016-09-08 MED ORDER — HYDROXYZINE HCL 25 MG PO TABS: 25.0000 mg | ORAL_TABLET | Freq: Two times a day (BID) | ORAL | 0 refills | Status: DC

## 2016-09-08 MED ORDER — OXYCODONE HCL 5 MG PO TABS: 5.0000 mg | ORAL_TABLET | Freq: Three times a day (TID) | ORAL | 0 refills | Status: DC | PRN

## 2016-09-08 MED ORDER — ALPRAZOLAM 2 MG PO TABS: 2.0000 mg | ORAL_TABLET | Freq: Two times a day (BID) | ORAL | 0 refills | Status: DC | PRN

## 2016-09-08 NOTE — Progress Notes (Signed)
Cardiology Office Note   Date:  09/08/2016   ID:  Leah Shah, Knappe 1956-03-06, MRN [id number]  Referring Doctor:  Keith Rake, MD   Cardiologist:   [name], MD   Reason for consultation:  Chief Complaint  Patient presents with  . other    F/u [place] reviewed verbally with pt.      History of Present Illness: Leah Shah is a 61 y.o. female who presents for [facility] hospital stay for CHF  Since discharge, patient has been doing quite well. She has not smoked since that time. Also, she is being more attention to the food that she takes in and avoiding salt. She thinks that her diuretic dose is keeping the swelling away.  No chest pain, palpitations, syncope.  ROS:  Please see the history of present illness. Aside from mentioned under HPI, all other systems are reviewed and negative.    Past Medical History:  Diagnosis Date  . Anxiety   . Arthritis    R knee, L knee - OA  . Chronic diastolic CHF (congestive heart failure) ([place])    a. 06/2016 Echo: EF 60-65%, no rwma, mod MR, mildly dil LA, nl RV fxn, PASP 77mmHg.  [name] Dysrhythmia    "mild arrythmia after taking Redux diet med years ago"no cardio fi  . Headache(784.0)   . History of stress test    a. 06/2016 MV: EF 67%, no ischemia/infarct.  . Hyperlipidemia   . Hypertension    echoJefm Bryant, wnl 2009? , had taken Redux for diet management  but now  off the market. pt, told that echo wnl.    . Moderate mitral regurgitation    a. 06/2016 Echo: EF 60-65%, mod MR.  . Morbid obesity ([place])   . Osteoarthritis   . Recurrent upper respiratory infection (URI)    treated /w OTC med.     Past Surgical History:  Procedure Laterality Date  . ABDOMINAL HYSTERECTOMY     partial-uterus removed  . CHOLECYSTECTOMY     2011  . COCCYX REMOVAL     [facility]  . EYE SURGERY     states both lens replaced. Not sure if cataract surgery or not.  [name] HEEL SPUR SURGERY    . I&D EXTREMITY  08/27/2011   Procedure:  IRRIGATION AND DEBRIDEMENT EXTREMITY;  Surgeon: [name], MD;  Location: [place];  Service: Orthopedics;  Laterality: Right;  . JOINT REPLACEMENT Right 2012   Right TKR  . KNEE ARTHROPLASTY  07/28/2011   Procedure: COMPUTER ASSISTED TOTAL KNEE ARTHROPLASTY;  Surgeon: [name], MD;  Location: [place];  Service: Orthopedics;  Laterality: Right;  right total knee arthroplasty, computer assisted  . KNEE ARTHROSCOPY     right knee  . TMJ ARTHROPLASTY     [place]     reports that she has been smoking.  She has a 25.00 pack-year smoking history. She has never used smokeless tobacco. She reports that she does not drink alcohol or use drugs.   family history includes Heart attack in her sister; Heart attack (age of onset: 56) in her maternal uncle; Heart disease in her maternal uncle and mother; Heart disease (age of onset: 73) in her sister; Hyperlipidemia in her sister; Hypertension in her father, mother, and sister.   Outpatient Medications Prior to Visit  Medication Sig Dispense Refill  . albuterol (PROVENTIL HFA;VENTOLIN HFA) 108 (90 Base) MCG/ACT inhaler Inhale 2 puffs into the lungs every 6 (six)  hours as needed for wheezing or shortness of breath. 1 Inhaler 0  . alprazolam (XANAX) 2 MG tablet Take 1 tablet (2 mg total) by mouth 2 (two) times daily as needed for anxiety. 60 tablet 0  . aspirin 81 MG EC tablet Take 1 tablet (81 mg total) by mouth daily. 30 tablet 0  . atenolol (TENORMIN) 100 MG tablet TAKE 1 TABLET (100 MG TOTAL) BY MOUTH DAILY. 90 tablet 0  . cyclobenzaprine (FLEXERIL) 5 MG tablet TAKE ONE AND ONE-HALF TABLETS BY MOUTH EVERY NIGHT AT BEDTIME 45 tablet 2  . furosemide (LASIX) 40 MG tablet Take 1 tablet (40 mg total) by mouth 2 (two) times daily. 60 tablet 0  . hydrOXYzine (ATARAX/VISTARIL) 25 MG tablet TAKE 1 TABLET BY MOUTH TWICE A DAY 180 tablet 0  . losartan (COZAAR) 50 MG tablet TAKE 1 TABLET (50 MG TOTAL) BY MOUTH DAILY. 90 tablet 1  . oxyCODONE (OXY  IR/ROXICODONE) 5 MG immediate release tablet Take 1 tablet (5 mg total) by mouth every 8 (eight) hours as needed for moderate pain. 90 tablet 0  . potassium chloride SA (K-DUR,KLOR-CON) 20 MEQ tablet Take 1 tablet (20 mEq total) by mouth 2 (two) times daily. 60 tablet 0  . simvastatin (ZOCOR) 40 MG tablet TAKE 1 TABLET (40 MG TOTAL) BY MOUTH DAILY AT 6 PM. 90 tablet 0  . traZODone (DESYREL) 150 MG tablet Take 1 tablet by mouth at bedtime.    . beclomethasone (QVAR) 40 MCG/ACT inhaler Inhale 1 puff into the lungs 2 (two) times daily. (Patient not taking: Reported on 09/08/2016) 1 Inhaler 0  . predniSONE (DELTASONE) 5 MG tablet 2 tabs po day1; 1 tab po day2,3 (Patient not taking: Reported on 09/08/2016) 4 tablet 0   No facility-administered medications prior to visit.      Allergies: Bacitracin-neomycin-polymyxin; Codeine; Gabapentin; Neosporin  [neomycin-bacitracin zn-polymyx]; and Pregabalin    PHYSICAL EXAM: VS:  BP 100/64 (BP Location: Left Arm, Patient Position: Sitting, Cuff Size: Normal)   Pulse 70   Ht 5\' 8"  (1.727 m)   Wt 291 lb 8 oz (132.2 kg)   BMI 44.32 kg/m  , Body mass index is 44.32 kg/m. Wt Readings from Last 3 Encounters:  09/08/16 292 lb 9.6 oz (132.7 kg)  09/08/16 291 lb 8 oz (132.2 kg)  08/29/16 (!) 301 lb 4.8 oz (136.7 kg)    GENERAL:  well developed, well nourished, Morbidly obese, not in acute distress HEENT: normocephalic, pink conjunctivae, anicteric sclerae, no xanthelasma, normal dentition, oropharynx clear NECK:  no neck vein engorgement, JVP normal, no hepatojugular reflux, carotid upstroke brisk and symmetric, no bruit, no thyromegaly, no lymphadenopathy LUNGS:  good respiratory effort, clear to auscultation bilaterally CV:  PMI not displaced, no thrills, no lifts, S1 and S2 within normal limits, no palpable S3 or S4, no murmurs, no rubs, no gallops ABD:  Soft, nontender, nondistended, normoactive bowel sounds, no abdominal aortic bruit, no hepatomegaly, no  splenomegaly MS: nontender back, no kyphosis, no scoliosis, no joint deformities EXT:  2+ DP/PT pulses, +2 edema, nonpitting edema on dorsum of feet, no varicosities, no cyanosis, no clubbing SKIN: warm, nondiaphoretic, normal turgor, no ulcers NEUROPSYCH: alert, oriented to person, place, and time, sensory/motor grossly intact, normal mood, appropriate affect  Recent Labs: 02/24/2016: ALT 35 08/27/2016: B Natriuretic Peptide 317.0; Hemoglobin 11.8; Magnesium 1.4; Platelets 86; TSH 0.766 08/29/2016: BUN 23; Creatinine, Ser 0.54; Potassium 3.8; Sodium 138   Lipid Panel    Component Value Date/Time   CHOL 111  08/28/2016 0507   CHOL 86 (L) 12/12/2015 1438   TRIG 70 08/28/2016 0507   HDL 33 (L) 08/28/2016 0507   HDL 24 (L) 12/12/2015 1438   CHOLHDL 3.4 08/28/2016 0507   VLDL 14 08/28/2016 0507   LDLCALC 64 08/28/2016 0507   LDLCALC 43 12/12/2015 1438     Other studies Reviewed:  EKG:  The ekg from11/30/2017 was personally reviewed by me and it revealed sinus rhythm, 63 BPM. QT recalculated at 440 ms, QTC 445 ms, within normal limits.  Additional studies/ records that were reviewed personally reviewed by me today include:   Echo 07/01/2016: Left ventricle: The cavity size was normal. Systolic function was   normal. The estimated ejection fraction was in the range of 60%   to 65%. Wall motion was normal; there were no regional wall   motion abnormalities. Left ventricular diastolic function   parameters were normal. - Mitral valve: There was moderate regurgitation. - Left atrium: The atrium was mildly dilated. - Right ventricle: Systolic function was normal. - Pulmonary arteries: Systolic pressure was mildly elevated. PA   peak pressure: 36 mm Hg (S).   ASSESSMENT AND PLAN: Leg swelling CHF, diastolic dysfunction, chronic Recommendations remain the same as from last visit 07/07/2016:  This is likely multifactorial: Fluid retention, lymphedema, obesity causing increased  abdominal pressure and impeding venous and lymph flow  No evidence of ischemia on stress test per preliminary report. Continue Lasix. Check BMP now and in 2 weeks. Follow-up in 2 months.   Shortness of breath Recommendations the same as from last visit one to 2018:  Likely again multifactorial. Patient has gained almost 100 pounds in the last 2 years due to dietary indiscretion. Patient continues to smoke. No evidence of ischemia on preliminary report of stress test.  HTN BP is well controlled. Continue monitoring BP. Continue current medical therapy and lifestyle changes.  Mitral regurgitation Recommend reevaluation in 1 year.  Hyperlipidemia PCP following labs.  Morbid obesity Body mass index is 44.32 kg/m.[name] Recommend aggressive weight loss through diet and increased physical activity.    Tobacco abuse No smoking since hospital stay. Commended on this.  Current medicines are reviewed at length with the patient today.  The patient does not have concerns regarding medicines.  Labs/ tests ordered today include:  Orders Placed This Encounter  Procedures  . Basic metabolic panel  . Basic metabolic panel    I had a lengthy and detailed discussion with the patient regarding diagnoses, prognosis, diagnostic options, treatment options , and side effects of medications.   I counseled the patient on importance of lifestyle modification including heart healthy diet, regular physical activity , and smoking cessation. We spent a great deal of time talking about how different factors are playing a role in her symptoms of leg swelling and shortness of breath. We talked about the importance of weight control, lifestyle changes, dietary restriction, weight loss.   Disposition:   FU with undersignedin 2 months  Signed, [name], MD  09/08/2016 2:28 PM    [place]  This note was generated in part with voice recognition software and I apologize for any  typographical errors that were not detected and corrected.

## 2016-09-08 NOTE — Patient Instructions (Signed)
Labwork: Your physician recommends that you return for lab work in: 2 weeks for BMP.    Follow-Up: Your physician recommends that you schedule a follow-up appointment in: 2 months with Dr. Yvone Neu.   It was a pleasure seeing you today here in the office. Please do not hesitate to give [place] a call back if you have any further questions. [place], BSN

## 2016-09-08 NOTE — Telephone Encounter (Signed)
Patient coming for appointment 

## 2016-09-08 NOTE — Progress Notes (Signed)
Name: Leah Shah   MRN: [id number]    DOB: Nov 14, 1955   Date:09/08/2016       Progress Note  Subjective  Chief Complaint  Chief Complaint  Patient presents with  . Hospitalization Follow-up  . Medication Refill    Anxiety  Presents for follow-up visit. The problem has been unchanged. Symptoms include depressed mood, excessive worry, insomnia, nervous/anxious behavior and panic. Patient reports no obsessions. Symptoms occur most days. The severity of symptoms is moderate and causing significant distress (worse as her son is going to the court for a custody battle for his children).   Her past medical history is significant for anxiety/panic attacks. Past treatments include benzodiazephines. Compliance with prior treatments has been good.  Back Pain  This is a chronic problem. The problem occurs daily. The problem is unchanged. The pain is present in the lumbar spine and sacro-iliac. The pain is at a severity of 7/10. The symptoms are aggravated by bending and position (worse with walking especially with arthritis in left knee). Pertinent negatives include no bladder incontinence or bowel incontinence. She has tried analgesics for the symptoms.   Pt. also presents for hospital follow up, she was diagnosed with Congestive Heart Failure with diastolic dysfunction, had pulmonary edema seen on CXR, now on Lasix 40 mg BID, also seen by Cardiology and 2-D echo confirmed diastolic dysfunction.  She denies any dyspnea, cough, leg swelling is improved, using Albuterol inhaler as needed.    Past Medical History:  Diagnosis Date  . Anxiety   . Arthritis    R knee, L knee - OA  . Chronic diastolic CHF (congestive heart failure) ([place])    a. 06/2016 Echo: EF 60-65%, no rwma, mod MR, mildly dil LA, nl RV fxn, PASP 69mmHg.  [name] Dysrhythmia    "mild arrythmia after taking Redux diet med years ago"no cardio fi  . Headache(784.0)   . History of stress test    a. 06/2016 MV: EF 67%, no ischemia/infarct.   . Hyperlipidemia   . Hypertension    echoJefm Bryant, wnl 2009? , had taken Redux for diet management  but now  off the market. pt, told that echo wnl.    . Moderate mitral regurgitation    a. 06/2016 Echo: EF 60-65%, mod MR.  . Morbid obesity ([place])   . Osteoarthritis   . Recurrent upper respiratory infection (URI)    treated /w OTC med.     Past Surgical History:  Procedure Laterality Date  . ABDOMINAL HYSTERECTOMY     partial-uterus removed  . CHOLECYSTECTOMY     2011  . COCCYX REMOVAL     [facility]  . EYE SURGERY     states both lens replaced. Not sure if cataract surgery or not.  [name] HEEL SPUR SURGERY    . I&D EXTREMITY  08/27/2011   Procedure: IRRIGATION AND DEBRIDEMENT EXTREMITY;  Surgeon: [name], MD;  Location: [place];  Service: Orthopedics;  Laterality: Right;  . JOINT REPLACEMENT Right 2012   Right TKR  . KNEE ARTHROPLASTY  07/28/2011   Procedure: COMPUTER ASSISTED TOTAL KNEE ARTHROPLASTY;  Surgeon: [name], MD;  Location: [place];  Service: Orthopedics;  Laterality: Right;  right total knee arthroplasty, computer assisted  . KNEE ARTHROSCOPY     right knee  . TMJ ARTHROPLASTY     [facility]- 1997    Family History  Problem Relation Age of Onset  . Heart disease Mother   . Hypertension Mother   .  Hypertension Father   . Heart disease Sister 64    stent   . Heart attack Sister   . Hyperlipidemia Sister   . Hypertension Sister   . Heart attack Maternal Uncle 64  . Heart disease Maternal Uncle     CABG    Social History   Social History  . Marital status: Married    Spouse name: N/A  . Number of children: N/A  . Years of education: N/A   Occupational History  . Not on file.   Social History Main Topics  . Smoking status: Current Every Day Smoker    Packs/day: 0.50    Years: 50.00  . Smokeless tobacco: Never Used  . Alcohol use No  . Drug use: No  . Sexual activity: Not Currently   Other Topics Concern  . Not on file   Social  History Narrative   Lives in [place] with husband.  Does not routinely exercise.     Current Outpatient Prescriptions:  .  albuterol (PROVENTIL HFA;VENTOLIN HFA) 108 (90 Base) MCG/ACT inhaler, Inhale 2 puffs into the lungs every 6 (six) hours as needed for wheezing or shortness of breath., Disp: 1 Inhaler, Rfl: 0 .  alprazolam (XANAX) 2 MG tablet, Take 1 tablet (2 mg total) by mouth 2 (two) times daily as needed for anxiety., Disp: 60 tablet, Rfl: 0 .  aspirin 81 MG EC tablet, Take 1 tablet (81 mg total) by mouth daily., Disp: 30 tablet, Rfl: 0 .  atenolol (TENORMIN) 100 MG tablet, TAKE 1 TABLET (100 MG TOTAL) BY MOUTH DAILY., Disp: 90 tablet, Rfl: 0 .  cyclobenzaprine (FLEXERIL) 5 MG tablet, TAKE ONE AND ONE-HALF TABLETS BY MOUTH EVERY NIGHT AT BEDTIME, Disp: 45 tablet, Rfl: 2 .  furosemide (LASIX) 40 MG tablet, Take 1 tablet (40 mg total) by mouth 2 (two) times daily., Disp: 60 tablet, Rfl: 0 .  hydrOXYzine (ATARAX/VISTARIL) 25 MG tablet, TAKE 1 TABLET BY MOUTH TWICE A DAY, Disp: 180 tablet, Rfl: 0 .  losartan (COZAAR) 50 MG tablet, TAKE 1 TABLET (50 MG TOTAL) BY MOUTH DAILY., Disp: 90 tablet, Rfl: 1 .  oxyCODONE (OXY IR/ROXICODONE) 5 MG immediate release tablet, Take 1 tablet (5 mg total) by mouth every 8 (eight) hours as needed for moderate pain., Disp: 90 tablet, Rfl: 0 .  potassium chloride SA (K-DUR,KLOR-CON) 20 MEQ tablet, Take 1 tablet (20 mEq total) by mouth 2 (two) times daily., Disp: 60 tablet, Rfl: 0 .  simvastatin (ZOCOR) 40 MG tablet, TAKE 1 TABLET (40 MG TOTAL) BY MOUTH DAILY AT 6 PM., Disp: 90 tablet, Rfl: 0 .  traZODone (DESYREL) 150 MG tablet, Take 1 tablet by mouth at bedtime., Disp: , Rfl:   Allergies  Allergen Reactions  . Bacitracin-Neomycin-Polymyxin Other (See Comments)  . Codeine Other (See Comments)    Itching "she can take some forms of it" Itching "she can take some forms of it"  . Gabapentin Nausea Only  . Neosporin  [Neomycin-Bacitracin Zn-Polymyx]   .  Pregabalin Other (See Comments)    stomach issues stomach issues     Review of Systems  Gastrointestinal: Negative for bowel incontinence.  Genitourinary: Negative for bladder incontinence.  Musculoskeletal: Positive for back pain.  Psychiatric/Behavioral: The patient is nervous/anxious and has insomnia.    Please see history of present illness for complete discussion of ROS   Objective  Vitals:   09/08/16 1402  BP: 137/78  Pulse: 80  Resp: 17  Temp: 98 F (36.7 C)  TempSrc: Oral  SpO2: 93%  Weight: 292 lb 9.6 oz (132.7 kg)  Height: 5\' 8"  (1.727 m)    Physical Exam  Constitutional: She is oriented to person, place, and time and well-developed, well-nourished, and in no distress.  Cardiovascular: Normal rate, regular rhythm and normal heart sounds.   No murmur heard. Pulmonary/Chest: Effort normal and breath sounds normal. She has no wheezes.  Abdominal: Soft. Bowel sounds are normal. There is no tenderness.  Musculoskeletal:       Lumbar back: She exhibits tenderness, pain and spasm.  3+ pitting edema bilaterally  Neurological: She is alert and oriented to person, place, and time.  Psychiatric: Mood, memory, affect and judgment normal.  Nursing note and vitals reviewed.    Assessment & Plan  1. Anxiety Stable, responsive to Xanax taken twice a day when necessary, refills provided - alprazolam (XANAX) 2 MG tablet; Take 1 tablet (2 mg total) by mouth 2 (two) times daily as needed for anxiety.  Dispense: 60 tablet; Refill: 0  2. Chronic midline low back pain without sciatica Stable and responsive to opioid therapy, patient compliant with controlled substances agreement, understands the dependence potential, side effects and drug interactions of opioids. Refills provided and follow-up in one month - oxyCODONE (OXY IR/ROXICODONE) 5 MG immediate release tablet; Take 1 tablet (5 mg total) by mouth every 8 (eight) hours as needed for moderate pain.  Dispense: 90 tablet;  Refill: 0  3. H/O skin pruritus  - hydrOXYzine (ATARAX/VISTARIL) 25 MG tablet; Take 1 tablet (25 mg total) by mouth 2 (two) times daily.  Dispense: 180 tablet; Refill: 0  4. COPD exacerbation ([place]) Now resolved, Qvar is not covered by insurance. We'll change to Breo1 puff daily. Reviewed cardiology notes - fluticasone furoate-vilanterol (BREO ELLIPTA) 100-25 MCG/INH AEPB; Inhale 1 puff into the lungs daily.  Dispense: 30 each; Refill: 0   Elida Harbin Asad A. [facility] 09/08/2016 2:49 PM

## 2016-09-09 ENCOUNTER — Ambulatory Visit: Payer: Medicare HMO | Admitting: Family

## 2016-09-09 LAB — BASIC METABOLIC PANEL
BUN / CREAT RATIO: 20 (ref 12–28)
BUN: 14 mg/dL (ref 8–27)
CHLORIDE: 101 mmol/L (ref 96–106)
CO2: 25 mmol/L (ref 18–29)
Calcium: 8.6 mg/dL — ABNORMAL LOW (ref 8.7–10.3)
Creatinine, Ser: 0.7 mg/dL (ref 0.57–1.00)
GFR calc Af Amer: 107 mL/min/{1.73_m2} (ref >59)
GFR calc non Af Amer: 93 mL/min/{1.73_m2} (ref >59)
GLUCOSE: 97 mg/dL (ref 65–99)
Potassium: 3.9 mmol/L (ref 3.5–5.2)
Sodium: 142 mmol/L (ref 134–144)

## 2016-09-09 NOTE — Addendum Note (Signed)
Addended by: Britt Bottom on: 09/09/2016 03:17 PM   Modules accepted: Orders

## 2016-09-18 ENCOUNTER — Other Ambulatory Visit: Payer: Self-pay

## 2016-09-18 NOTE — Patient Outreach (Signed)
[facility]) Care Management  09/18/2016  Leah Shah 11-21-1955 [id number]   Transition of Care Referral  Referral Date: 09/01/16 Referral Source: [facility] Liaison Date of Discharge: 08/29/16 Facility: [place]: [employer]   RN CM has made multiple attempts to establish contact with patient. No response from letter mailed to patient. Case is being closed at this time.    Plan: RN CM will notify [facility] administrative assistant of case closure. RN CM will send MD case closure letter.   Enzo Montgomery, RN,BSN,CCM [place] Management Telephonic Care Management Coordinator Direct Phone: [phone] Toll Free: [phone] Fax: [phone]

## 2016-09-20 ENCOUNTER — Other Ambulatory Visit: Payer: Self-pay | Admitting: Family Medicine

## 2016-09-20 DIAGNOSIS — I10 Essential (primary) hypertension: Secondary | ICD-10-CM

## 2016-09-20 DIAGNOSIS — E785 Hyperlipidemia, unspecified: Secondary | ICD-10-CM

## 2016-09-22 ENCOUNTER — Telehealth: Payer: Self-pay | Admitting: *Deleted

## 2016-09-22 ENCOUNTER — Other Ambulatory Visit (INDEPENDENT_AMBULATORY_CARE_PROVIDER_SITE_OTHER): Payer: Medicare HMO | Admitting: *Deleted

## 2016-09-22 DIAGNOSIS — I5032 Chronic diastolic (congestive) heart failure: Secondary | ICD-10-CM | POA: Diagnosis not present

## 2016-09-22 NOTE — Telephone Encounter (Signed)
No answer. Left message to call back.   

## 2016-09-22 NOTE — Telephone Encounter (Signed)
Patient in office today for lab work. Patient expressed she's been experiencing SOB and lower extremity swelling to CMA and wanted to speak to someone. In speaking with patient, she's had increase SOB with exertion for 3 days.  Lower extremity swelling increased for 1 week.  She is inconsistent with daily weights. Yesterday, 09/21/16, her weight was up 3 lbs according to her home scales.  She did not weigh herself this morning. She takes furosemide 40 mg BID. BP  Advised patient to take an extra furosemide 40mg  today and keep track of her daily weights at home. She verbalized understanding. She also received a new inhaler (Breo) from Dr Manuella Ghazi but has not picked up Rx because it is too expensive for her.  Routing to Dr Yvone Neu for further advice.

## 2016-09-22 NOTE — Telephone Encounter (Signed)
Received incoming call from patient.  She verbalized understanding. She went ahead and scheduled an appt with Dr Yvone Neu for Thursday, 09/24/16 and she will call back to cancel if she does not feel the need for appt tomorrow. Patient aware of no show policy and will call to cancel if needed.

## 2016-09-22 NOTE — Telephone Encounter (Signed)
Agree with extra lasix. ffup soon if symptoms persist.

## 2016-09-22 NOTE — Telephone Encounter (Signed)
Left detailed message to call back if she would like to schedule a visit with Dr. Yvone Neu for her symptoms.

## 2016-09-24 ENCOUNTER — Ambulatory Visit: Payer: Medicare HMO | Admitting: Cardiology

## 2016-09-24 LAB — BASIC METABOLIC PANEL
BUN/Creatinine Ratio: 29 — ABNORMAL HIGH (ref 12–28)
BUN: 21 mg/dL (ref 8–27)
CHLORIDE: 105 mmol/L (ref 96–106)
CO2: 22 mmol/L (ref 18–29)
Calcium: 8.3 mg/dL — ABNORMAL LOW (ref 8.7–10.3)
Creatinine, Ser: 0.73 mg/dL (ref 0.57–1.00)
GFR calc non Af Amer: 89 mL/min/{1.73_m2} (ref >59)
GFR, EST AFRICAN AMERICAN: 102 mL/min/{1.73_m2} (ref >59)
GLUCOSE: 124 mg/dL — AB (ref 65–99)
POTASSIUM: 4.9 mmol/L (ref 3.5–5.2)
Sodium: 142 mmol/L (ref 134–144)

## 2016-09-28 ENCOUNTER — Telehealth: Payer: Self-pay | Admitting: Family Medicine

## 2016-09-28 NOTE — Telephone Encounter (Signed)
Called Pt to schedule AWV with [facility] - knb °

## 2016-09-30 ENCOUNTER — Telehealth: Payer: Self-pay | Admitting: Family Medicine

## 2016-09-30 NOTE — Telephone Encounter (Signed)
Pt has upcoming appt but is completely out of the potassium chloride and furosemide (her fluid pills). Please send enough to [facility]

## 2016-10-01 MED ORDER — POTASSIUM CHLORIDE CRYS ER 20 MEQ PO TBCR: 20.0000 meq | EXTENDED_RELEASE_TABLET | Freq: Two times a day (BID) | ORAL | 0 refills | Status: DC

## 2016-10-01 MED ORDER — FUROSEMIDE 40 MG PO TABS: 40.0000 mg | ORAL_TABLET | Freq: Two times a day (BID) | ORAL | 0 refills | Status: DC

## 2016-10-01 NOTE — Telephone Encounter (Signed)
Medication has been refilled and sent to [facility] 

## 2016-10-12 ENCOUNTER — Encounter: Payer: Self-pay | Admitting: Family Medicine

## 2016-10-12 ENCOUNTER — Ambulatory Visit (INDEPENDENT_AMBULATORY_CARE_PROVIDER_SITE_OTHER): Payer: Medicare HMO | Admitting: Family Medicine

## 2016-10-12 VITALS — BP 110/68 | HR 70 | Temp 97.8°F | Resp 16 | Ht 68.0 in | Wt 297.6 lb

## 2016-10-12 DIAGNOSIS — M25473 Effusion, unspecified ankle: Secondary | ICD-10-CM | POA: Diagnosis not present

## 2016-10-12 DIAGNOSIS — F419 Anxiety disorder, unspecified: Secondary | ICD-10-CM | POA: Diagnosis not present

## 2016-10-12 DIAGNOSIS — M545 Low back pain: Secondary | ICD-10-CM | POA: Diagnosis not present

## 2016-10-12 DIAGNOSIS — G8929 Other chronic pain: Secondary | ICD-10-CM

## 2016-10-12 DIAGNOSIS — M25475 Effusion, left foot: Secondary | ICD-10-CM

## 2016-10-12 DIAGNOSIS — M25476 Effusion, unspecified foot: Secondary | ICD-10-CM | POA: Diagnosis not present

## 2016-10-12 MED ORDER — OXYCODONE HCL 5 MG PO TABS: 5.0000 mg | ORAL_TABLET | Freq: Three times a day (TID) | ORAL | 0 refills | Status: DC | PRN

## 2016-10-12 MED ORDER — ALPRAZOLAM 2 MG PO TABS: 2.0000 mg | ORAL_TABLET | Freq: Two times a day (BID) | ORAL | 0 refills | Status: DC | PRN

## 2016-10-12 MED ORDER — FUROSEMIDE 40 MG PO TABS: 40.0000 mg | ORAL_TABLET | Freq: Two times a day (BID) | ORAL | 0 refills | Status: DC

## 2016-10-12 NOTE — Progress Notes (Signed)
Name: Leah Shah   MRN: [id number]    DOB: 07-02-1956   Date:10/12/2016       Progress Note  Subjective  Chief Complaint  Chief Complaint  Patient presents with  . Follow-up    1 mo  . Medication Refill    Anxiety  Presents for follow-up visit. The problem has been unchanged. Symptoms include depressed mood, excessive worry, insomnia, nervous/anxious behavior and panic. Patient reports no obsessions. Symptoms occur most days. The severity of symptoms is moderate and causing significant distress.   Her past medical history is significant for anxiety/panic attacks. Past treatments include benzodiazephines. Compliance with prior treatments has been good.  Back Pain  This is a chronic problem. The problem occurs daily. The problem is unchanged. The pain is present in the lumbar spine and sacro-iliac. The pain is at a severity of 7/10. The symptoms are aggravated by bending and position (worse with walking especially with arthritis in left knee). Pertinent negatives include no bladder incontinence or bowel incontinence. She has tried analgesics for the symptoms.   Lower Extremity Swelling: Pt. Presents for evalaution of bilateral lower extremity swellig, worse with more fluid accumulation, she is taking Furosemide 40 mg BID, reports that she was on a higher dose in the hospital (40 mg 2 tablets in AM and 1 in PM). She has used compression stockings as well.    Past Medical History:  Diagnosis Date  . Anxiety   . Arthritis    R knee, L knee - OA  . Chronic diastolic CHF (congestive heart failure) ([place])    a. 06/2016 Echo: EF 60-65%, no rwma, mod MR, mildly dil LA, nl RV fxn, PASP 38mmHg.  [name] Dysrhythmia    "mild arrythmia after taking Redux diet med years ago"no cardio fi  . Headache(784.0)   . History of stress test    a. 06/2016 MV: EF 67%, no ischemia/infarct.  . Hyperlipidemia   . Hypertension    echoJefm Bryant, wnl 2009? , had taken Redux for diet management  but now  off the  market. pt, told that echo wnl.    . Moderate mitral regurgitation    a. 06/2016 Echo: EF 60-65%, mod MR.  . Morbid obesity ([place])   . Osteoarthritis   . Recurrent upper respiratory infection (URI)    treated /w OTC med.     Past Surgical History:  Procedure Laterality Date  . ABDOMINAL HYSTERECTOMY     partial-uterus removed  . CHOLECYSTECTOMY     2011  . [facility]CCYX REMOVAL     [facility]  . EYE SURGERY     states both lens replaced. Not sure if cataract surgery or not.  [name] HEEL SPUR SURGERY    . I&D EXTREMITY  08/27/2011   Procedure: IRRIGATION AND DEBRIDEMENT EXTREMITY;  Surgeon: [name], MD;  Location: [facility];  Service: Orthopedics;  Laterality: Right;  . JOINT REPLACEMENT Right 2012   Right TKR  . KNEE ARTHROPLASTY  07/28/2011   Procedure: COMPUTER ASSISTED TOTAL KNEE ARTHROPLASTY;  Surgeon: [name], MD;  Location: [facility];  Service: Orthopedics;  Laterality: Right;  right total knee arthroplasty, computer assisted  . KNEE ARTHROSCOPY     right knee  . TMJ ARTHROPLASTY     [facility]- 1997    Family History  Problem Relation Age of Onset  . Heart disease Mother   . Hypertension Mother   . Hypertension Father   . Heart disease Sister 62    stent   .  Heart attack Sister   . Hyperlipidemia Sister   . Hypertension Sister   . Heart attack Maternal Uncle 64  . Heart disease Maternal Uncle     CABG    Social History   Social History  . Marital status: Married    Spouse name: N/A  . Number of children: N/A  . Years of education: N/A   Occupational History  . Not on file.   Social History Main Topics  . Smoking status: Current Every Day Smoker    Packs/day: 0.50    Years: 50.00  . Smokeless tobacco: Never Used  . Alcohol use No  . Drug use: No  . Sexual activity: Not Currently   Other Topics Concern  . Not on file   Social History Narrative   Lives in [place] with husband.  Does not routinely exercise.     Current Outpatient  Prescriptions:  .  albuterol (PROVENTIL HFA;VENTOLIN HFA) 108 (90 Base) MCG/ACT inhaler, Inhale 2 puffs into the lungs every 6 (six) hours as needed for wheezing or shortness of breath., Disp: 1 Inhaler, Rfl: 0 .  alprazolam (XANAX) 2 MG tablet, Take 1 tablet (2 mg total) by mouth 2 (two) times daily as needed for anxiety., Disp: 60 tablet, Rfl: 0 .  aspirin 81 MG EC tablet, Take 1 tablet (81 mg total) by mouth daily., Disp: 30 tablet, Rfl: 0 .  atenolol (TENORMIN) 100 MG tablet, TAKE 1 TABLET (100 MG TOTAL) BY MOUTH DAILY., Disp: 90 tablet, Rfl: 0 .  cyclobenzaprine (FLEXERIL) 5 MG tablet, TAKE ONE AND ONE-HALF TABLETS BY MOUTH EVERY NIGHT AT BEDTIME, Disp: 45 tablet, Rfl: 2 .  furosemide (LASIX) 40 MG tablet, Take 1 tablet (40 mg total) by mouth 2 (two) times daily., Disp: 60 tablet, Rfl: 0 .  hydrOXYzine (ATARAX/VISTARIL) 25 MG tablet, Take 1 tablet (25 mg total) by mouth 2 (two) times daily., Disp: 180 tablet, Rfl: 0 .  losartan (COZAAR) 50 MG tablet, TAKE 1 TABLET (50 MG TOTAL) BY MOUTH DAILY., Disp: 90 tablet, Rfl: 1 .  oxyCODONE (OXY IR/ROXICODONE) 5 MG immediate release tablet, Take 1 tablet (5 mg total) by mouth every 8 (eight) hours as needed for moderate pain., Disp: 90 tablet, Rfl: 0 .  potassium chloride SA (K-DUR,KLOR-CON) 20 MEQ tablet, Take 1 tablet (20 mEq total) by mouth 2 (two) times daily., Disp: 60 tablet, Rfl: 0 .  simvastatin (ZOCOR) 40 MG tablet, TAKE 1 TABLET (40 MG TOTAL) BY MOUTH DAILY AT 6 PM., Disp: 90 tablet, Rfl: 0 .  traZODone (DESYREL) 150 MG tablet, Take 1 tablet by mouth at bedtime., Disp: , Rfl:  .  fluticasone furoate-vilanterol (BREO ELLIPTA) 100-25 MCG/INH AEPB, Inhale 1 puff into the lungs daily. (Patient not taking: Reported on 10/12/2016), Disp: 30 each, Rfl: 0  Allergies  Allergen Reactions  . Bacitracin-Neomycin-Polymyxin Other (See Comments)  . Codeine Other (See Comments)    Itching "she can take some forms of it" Itching "she can take some forms of it"   . Gabapentin Nausea Only  . Neosporin  [Neomycin-Bacitracin Zn-Polymyx]   . Pregabalin Other (See Comments)    stomach issues stomach issues     Review of Systems  Gastrointestinal: Negative for bowel incontinence.  Genitourinary: Negative for bladder incontinence.  Musculoskeletal: Positive for back pain.  Psychiatric/Behavioral: The patient is nervous/anxious and has insomnia.       Objective  Vitals:   10/12/16 1338  BP: 110/68  Pulse: 70  Resp: 16  Temp: 97.8 F (  36.6 C)  TempSrc: Oral  SpO2: 94%  Weight: 297 lb 9.6 oz (135 kg)  Height: 5\' 8"  (1.727 m)    Physical Exam  Constitutional: She is oriented to person, place, and time and well-developed, well-nourished, and in no distress.  Cardiovascular: Normal rate, regular rhythm and normal heart sounds.   No murmur heard. Pulmonary/Chest: Effort normal and breath sounds normal. She has no wheezes.  Abdominal: Soft. Bowel sounds are normal. There is no tenderness.  Musculoskeletal: She exhibits edema (3+ pitting pedal edema bilateral feet and ankles).       Lumbar back: She exhibits tenderness, pain and spasm.       Back:  3+ pitting edema bilaterally  Neurological: She is alert and oriented to person, place, and time.  Psychiatric: Mood, memory, affect and judgment normal.  Nursing note and vitals reviewed.      Assessment & Plan  1. Anxiety Stable although slightly worse because of concerns about her son's marital problems, continue on alprazolam as prescribed. Patient compliant with controlled substances agreement and understands the dependence schedule, side effects and drug interactions of benzodiazepines, refills provided and follow-up in one month - alprazolam (XANAX) 2 MG tablet; Take 1 tablet (2 mg total) by mouth 2 (two) times daily as needed for anxiety.  Dispense: 60 tablet; Refill: 0  2. Chronic midline low back pain without sciatica Stable and responsive to oxycodone taken every 8 hours as  needed, refills provided and follow-up in one month - oxyCODONE (OXY IR/ROXICODONE) 5 MG immediate release tablet; Take 1 tablet (5 mg total) by mouth every 8 (eight) hours as needed for moderate pain.  Dispense: 90 tablet; Refill: 0  3. Bilateral swelling of feet and ankles Worsening leg swelling, advised to continue with compression stockings, increase Lasix to 60 mg in the morning, can continue unchanged at 40 mg at night, refills provided - furosemide (LASIX) 40 MG tablet; Take 1 tablet (40 mg total) by mouth 2 (two) times daily. Take 60 mg in morning, 40 mg at night  Dispense: 135 tablet; Refill: 0   Barbar Brede [facility] 10/12/2016 1:48 PM

## 2016-11-05 ENCOUNTER — Encounter: Payer: Self-pay | Admitting: Family Medicine

## 2016-11-05 ENCOUNTER — Telehealth: Payer: Self-pay | Admitting: Family Medicine

## 2016-11-05 NOTE — Telephone Encounter (Signed)
Pt has abcess on her tooth and is not able to get into the dentist office until next Friday, however they would not be able to prescribed any antibiotic. She was told to contact her pcp. I did offer her an appointment to see you today but she stated that she has to come in next week to see you. Uses [facility] pharmacy

## 2016-11-05 NOTE — Telephone Encounter (Signed)
Please schedule patient for an appointment to evaluate her symptoms and prescribed appropriate therapy.

## 2016-11-05 NOTE — Telephone Encounter (Signed)
Patient will have to be seen to evaluate her abscess and prescribe appropriate therapy. Please schedule for an appointment as soon as possible, I will be glad to see on Friday afternoon at 220PM

## 2016-11-05 NOTE — Telephone Encounter (Signed)
Pt said that the antiobotic needs to be sent to [facility] in [facility] in [place]. She has appt with dentist on the 11th

## 2016-11-06 ENCOUNTER — Ambulatory Visit (INDEPENDENT_AMBULATORY_CARE_PROVIDER_SITE_OTHER): Payer: Medicare HMO | Admitting: Family Medicine

## 2016-11-06 ENCOUNTER — Encounter: Payer: Self-pay | Admitting: Family Medicine

## 2016-11-06 VITALS — BP 113/69 | HR 70 | Temp 97.6°F | Resp 18 | Wt 297.9 lb

## 2016-11-06 DIAGNOSIS — K047 Periapical abscess without sinus: Secondary | ICD-10-CM | POA: Diagnosis not present

## 2016-11-06 MED ORDER — CLINDAMYCIN HCL 300 MG PO CAPS: 300.0000 mg | ORAL_CAPSULE | Freq: Four times a day (QID) | ORAL | 0 refills | Status: AC

## 2016-11-06 NOTE — Telephone Encounter (Signed)
Called and left message for patient.

## 2016-11-06 NOTE — Telephone Encounter (Signed)
Left message for patient to call back and make appointment.

## 2016-11-06 NOTE — Progress Notes (Signed)
Name: Leah Shah   MRN: [id number]    DOB: [birth year]   Date:11/06/2016       Progress Note  Subjective  Chief Complaint  Chief Complaint  Patient presents with  . Medication Refill    Antibiotic    HPI  Pt. Presents for evaluation of a possible dental infection. She started having pain on the inside of her mouth on the left side, gradually got worse. She contacted her dentist who referred her to oral surgeon. The oral surgeon believes that the tooth may have to be extracted but recommended her to get antibiotics from PCP. She has been taking Aleve and her opioid medications for pain, using warm water which has helped to some extent.  Had trouble swallowing when her symptoms first started, had no fevers or chills.    Past Medical History:  Diagnosis Date  . Anxiety   . Arthritis    R knee, L knee - OA  . Chronic diastolic CHF (congestive heart failure) ([place])    a. 06/2016 Echo: EF 60-65%, no rwma, mod MR, mildly dil LA, nl RV fxn, PASP 42mmHg.  [name] Dysrhythmia    "mild arrythmia after taking Redux diet med years ago"no cardio fi  . Headache(784.0)   . History of stress test    a. 06/2016 MV: EF 67%, no ischemia/infarct.  . Hyperlipidemia   . Hypertension    echoJefm Bryant, wnl 2009? , had taken Redux for diet management  but now  off the market. pt, told that echo wnl.    . Moderate mitral regurgitation    a. 06/2016 Echo: EF 60-65%, mod MR.  . Morbid obesity ([place])   . Osteoarthritis   . Recurrent upper respiratory infection (URI)    treated /w OTC med.     Past Surgical History:  Procedure Laterality Date  . ABDOMINAL HYSTERECTOMY     partial-uterus removed  . CHOLECYSTECTOMY     2011  . COCCYX REMOVAL     [facility]  . EYE SURGERY     states both lens replaced. Not sure if cataract surgery or not.  [name] HEEL SPUR SURGERY    . I&D EXTREMITY  08/27/2011   Procedure: IRRIGATION AND DEBRIDEMENT EXTREMITY;  Surgeon: [name], MD;  Location: [facility];  Service:  Orthopedics;  Laterality: Right;  . JOINT REPLACEMENT Right 2012   Right TKR  . KNEE ARTHROPLASTY  07/28/2011   Procedure: COMPUTER ASSISTED TOTAL KNEE ARTHROPLASTY;  Surgeon: [name], MD;  Location: [facility];  Service: Orthopedics;  Laterality: Right;  right total knee arthroplasty, computer assisted  . KNEE ARTHROSCOPY     right knee  . TMJ ARTHROPLASTY     [facility]- 1997    Family History  Problem Relation Age of Onset  . Heart disease Mother   . Hypertension Mother   . Hypertension Father   . Heart disease Sister 42    stent   . Heart attack Sister   . Hyperlipidemia Sister   . Hypertension Sister   . Heart attack Maternal Uncle 64  . Heart disease Maternal Uncle     CABG    Social History   Social History  . Marital status: Married    Spouse name: N/A  . Number of children: N/A  . Years of education: N/A   Occupational History  . Not on file.   Social History Main Topics  . Smoking status: Current Every Day Smoker    Packs/day: 0.50  Years: 50.00  . Smokeless tobacco: Never Used  . Alcohol use No  . Drug use: No  . Sexual activity: Not Currently   Other Topics Concern  . Not on file   Social History Narrative   Lives in [place] with husband.  Does not routinely exercise.     Current Outpatient Prescriptions:  .  albuterol (PROVENTIL HFA;VENTOLIN HFA) 108 (90 Base) MCG/ACT inhaler, Inhale 2 puffs into the lungs every 6 (six) hours as needed for wheezing or shortness of breath., Disp: 1 Inhaler, Rfl: 0 .  alprazolam (XANAX) 2 MG tablet, Take 1 tablet (2 mg total) by mouth 2 (two) times daily as needed for anxiety., Disp: 60 tablet, Rfl: 0 .  aspirin 81 MG EC tablet, Take 1 tablet (81 mg total) by mouth daily., Disp: 30 tablet, Rfl: 0 .  atenolol (TENORMIN) 100 MG tablet, TAKE 1 TABLET (100 MG TOTAL) BY MOUTH DAILY., Disp: 90 tablet, Rfl: 0 .  cyclobenzaprine (FLEXERIL) 5 MG tablet, TAKE ONE AND ONE-HALF TABLETS BY MOUTH EVERY NIGHT AT BEDTIME,  Disp: 45 tablet, Rfl: 2 .  fluticasone furoate-vilanterol (BREO ELLIPTA) 100-25 MCG/INH AEPB, Inhale 1 puff into the lungs daily., Disp: 30 each, Rfl: 0 .  furosemide (LASIX) 40 MG tablet, Take 1 tablet (40 mg total) by mouth 2 (two) times daily. Take 60 mg in morning, 40 mg at night, Disp: 135 tablet, Rfl: 0 .  hydrOXYzine (ATARAX/VISTARIL) 25 MG tablet, Take 1 tablet (25 mg total) by mouth 2 (two) times daily., Disp: 180 tablet, Rfl: 0 .  losartan (COZAAR) 50 MG tablet, TAKE 1 TABLET (50 MG TOTAL) BY MOUTH DAILY., Disp: 90 tablet, Rfl: 1 .  oxyCODONE (OXY IR/ROXICODONE) 5 MG immediate release tablet, Take 1 tablet (5 mg total) by mouth every 8 (eight) hours as needed for moderate pain., Disp: 90 tablet, Rfl: 0 .  potassium chloride SA (K-DUR,KLOR-CON) 20 MEQ tablet, Take 1 tablet (20 mEq total) by mouth 2 (two) times daily., Disp: 60 tablet, Rfl: 0 .  simvastatin (ZOCOR) 40 MG tablet, TAKE 1 TABLET (40 MG TOTAL) BY MOUTH DAILY AT 6 PM., Disp: 90 tablet, Rfl: 0 .  traZODone (DESYREL) 150 MG tablet, Take 1 tablet by mouth at bedtime., Disp: , Rfl:   Allergies  Allergen Reactions  . Bacitracin-Neomycin-Polymyxin Other (See Comments)  . Codeine Other (See Comments)    Itching "she can take some forms of it" Itching "she can take some forms of it"  . Gabapentin Nausea Only  . Neosporin  [Neomycin-Bacitracin Zn-Polymyx]   . Pregabalin Other (See Comments)    stomach issues stomach issues     ROS  Please see history of present illness for complete discussion of ROS  Objective  Vitals:   11/06/16 1440  BP: 113/69  Pulse: 70  Resp: 18  Temp: 97.6 F (36.4 C)  TempSrc: Oral  SpO2: 93%  Weight: 297 lb 14.4 oz (135.1 kg)    Physical Exam  Constitutional: She is oriented to person, place, and time and well-developed, well-nourished, and in no distress.  HENT:  Head: Normocephalic and atraumatic.    Mouth/Throat:    Poor dentition, tenderness around the left lower gingival  area.  Facial swelling and tenderness on the left side.   Neck: Neck supple.  Cardiovascular: Normal rate, regular rhythm, S1 normal and S2 normal.   No murmur heard. Pulmonary/Chest: Effort normal and breath sounds normal. She has no rhonchi.  Neurological: She is alert and oriented to person, place, and time.  Psychiatric: Mood, memory, affect and judgment normal.  Nursing note and vitals reviewed.      Assessment & Plan  1. Dental infection Possible dental abscess with surrounding gingival inflammation, start on 10 day course of clindamycin, advised to contact the dentist to ask for a prescription oral rinse such as chlorhexidine gluconate etc. Patient verbalized agreement. - clindamycin (CLEOCIN) 300 MG capsule; Take 1 capsule (300 mg total) by mouth 4 (four) times daily.  Dispense: 40 capsule; Refill: 0   Rebekka Lobello [facility] 11/06/2016 2:47 PM

## 2016-11-09 ENCOUNTER — Encounter: Payer: Self-pay | Admitting: Family Medicine

## 2016-11-09 ENCOUNTER — Ambulatory Visit (INDEPENDENT_AMBULATORY_CARE_PROVIDER_SITE_OTHER): Payer: Medicare HMO

## 2016-11-09 ENCOUNTER — Ambulatory Visit (INDEPENDENT_AMBULATORY_CARE_PROVIDER_SITE_OTHER): Payer: Medicare HMO | Admitting: Family Medicine

## 2016-11-09 VITALS — HR 72 | Temp 96.9°F | Ht 68.0 in | Wt 298.3 lb

## 2016-11-09 DIAGNOSIS — T502X5A Adverse effect of carbonic-anhydrase inhibitors, benzothiadiazides and other diuretics, initial encounter: Secondary | ICD-10-CM | POA: Diagnosis not present

## 2016-11-09 DIAGNOSIS — G8929 Other chronic pain: Secondary | ICD-10-CM | POA: Diagnosis not present

## 2016-11-09 DIAGNOSIS — Z0000 Encounter for general adult medical examination without abnormal findings: Secondary | ICD-10-CM

## 2016-11-09 DIAGNOSIS — F419 Anxiety disorder, unspecified: Secondary | ICD-10-CM | POA: Diagnosis not present

## 2016-11-09 DIAGNOSIS — E876 Hypokalemia: Secondary | ICD-10-CM

## 2016-11-09 DIAGNOSIS — Z872 Personal history of diseases of the skin and subcutaneous tissue: Secondary | ICD-10-CM

## 2016-11-09 DIAGNOSIS — M545 Low back pain, unspecified: Secondary | ICD-10-CM

## 2016-11-09 DIAGNOSIS — I10 Essential (primary) hypertension: Secondary | ICD-10-CM | POA: Diagnosis not present

## 2016-11-09 MED ORDER — POTASSIUM CHLORIDE CRYS ER 20 MEQ PO TBCR: 20.0000 meq | EXTENDED_RELEASE_TABLET | Freq: Two times a day (BID) | ORAL | 2 refills | Status: DC

## 2016-11-09 MED ORDER — LOSARTAN POTASSIUM 50 MG PO TABS: ORAL_TABLET | ORAL | 1 refills | Status: DC

## 2016-11-09 MED ORDER — HYDROXYZINE HCL 25 MG PO TABS: 25.0000 mg | ORAL_TABLET | Freq: Two times a day (BID) | ORAL | 0 refills | Status: DC

## 2016-11-09 MED ORDER — CYCLOBENZAPRINE HCL 5 MG PO TABS
ORAL_TABLET | ORAL | 2 refills | Status: DC
Start: 2016-11-09 — End: 2017-02-08

## 2016-11-09 MED ORDER — ALPRAZOLAM 2 MG PO TABS: 2.0000 mg | ORAL_TABLET | Freq: Two times a day (BID) | ORAL | 0 refills | Status: DC | PRN

## 2016-11-09 MED ORDER — OXYCODONE HCL 5 MG PO TABS: 5.0000 mg | ORAL_TABLET | Freq: Three times a day (TID) | ORAL | 0 refills | Status: DC | PRN

## 2016-11-09 NOTE — Patient Instructions (Signed)
Leah Shah , Thank you for taking time to come for your Medicare Wellness Visit. I appreciate your ongoing commitment to your health goals. Please review the following plan we discussed and let me know if I can assist you in the future.   Screening recommendations/referrals: Colonoscopy: completed 09/24/11, due 09/2021 Mammogram: patient to call and set up this year Recommended yearly ophthalmology/optometry visit for glaucoma screening and checkup Recommended yearly dental visit for hygiene and checkup  Vaccinations: Influenza vaccine: up to date, due 03/2017 Pneumococcal vaccine: Pneumovax 23 given 08/28/16, Prevnar 13 due 08/28/17 Tdap vaccine: declined Shingles vaccine: declined    Advanced directives: Discussed at [place] today. Pt declined, but was advised to contact office once ready to set up form.   Conditions/risks identified: Obesity, recommend exercising 3 times a week for 30 minutes.   Next appointment: None, need to schedule 1 year AWV  Preventive Care 40-64 Years, Female Preventive care refers to lifestyle choices and visits with your health care provider that can promote health and wellness. What does preventive care include?  A yearly physical exam. This is also called an annual well check.  Dental exams once or twice a year.  Routine eye exams. Ask your health care provider how often you should have your eyes checked.  Personal lifestyle choices, including:  Daily care of your teeth and gums.  Regular physical activity.  Eating a healthy diet.  Avoiding tobacco and drug use.  Limiting alcohol use.  Practicing safe sex.  Taking low-dose aspirin daily starting at age 82.  Taking vitamin and mineral supplements as recommended by your health care provider. What happens during an annual well check? The services and screenings done by your health care provider during your annual well check will depend on your age, overall health, lifestyle risk factors, and  family history of disease. Counseling  Your health care provider may ask you questions about your:  Alcohol use.  Tobacco use.  Drug use.  Emotional well-being.  Home and relationship well-being.  Sexual activity.  Eating habits.  Work and work Statistician.  Method of birth control.  Menstrual cycle.  Pregnancy history. Screening  You may have the following tests or measurements:  Height, weight, and BMI.  Blood pressure.  Lipid and cholesterol levels. These may be checked every 5 years, or more frequently if you are over 28 years old.  Skin check.  Lung cancer screening. You may have this screening every year starting at age 64 if you have a 30-pack-year history of smoking and currently smoke or have quit within the past 15 years.  Fecal occult blood test (FOBT) of the stool. You may have this test every year starting at age 2.  Flexible sigmoidoscopy or colonoscopy. You may have a sigmoidoscopy every 5 years or a colonoscopy every 10 years starting at age 54.  Hepatitis C blood test.  Hepatitis B blood test.  Sexually transmitted disease (STD) testing.  Diabetes screening. This is done by checking your blood sugar (glucose) after you have not eaten for a while (fasting). You may have this done every 1-3 years.  Mammogram. This may be done every 1-2 years. Talk to your health care provider about when you should start having regular mammograms. This may depend on whether you have a family history of breast cancer.  BRCA-related cancer screening. This may be done if you have a family history of breast, ovarian, tubal, or peritoneal cancers.  Pelvic exam and Pap test. This may be done every 3  years starting at age 37. Starting at age 32, this may be done every 5 years if you have a Pap test in combination with an HPV test.  Bone density scan. This is done to screen for osteoporosis. You may have this scan if you are at high risk for osteoporosis. Discuss your  test results, treatment options, and if necessary, the need for more tests with your health care provider. Vaccines  Your health care provider may recommend certain vaccines, such as:  Influenza vaccine. This is recommended every year.  Tetanus, diphtheria, and acellular pertussis (Tdap, Td) vaccine. You may need a Td booster every 10 years.  Zoster vaccine. You may need this after age 53.  Pneumococcal 13-valent conjugate (PCV13) vaccine. You may need this if you have certain conditions and were not previously vaccinated.  Pneumococcal polysaccharide (PPSV23) vaccine. You may need one or two doses if you smoke cigarettes or if you have certain conditions. Talk to your health care provider about which screenings and vaccines you need and how often you need them. This information is not intended to replace advice given to you by your health care provider. Make sure you discuss any questions you have with your health care provider. Document Released: 07/19/2015 Document Revised: 03/11/2016 Document Reviewed: 04/23/2015 Elsevier Interactive Patient Education  2017 [place] Prevention in the Home Falls can cause injuries. They can happen to people of all ages. There are many things you can do to make your home safe and to help prevent falls. What can I do on the outside of my home?  Regularly fix the edges of walkways and driveways and fix any cracks.  Remove anything that might make you trip as you walk through a door, such as a raised step or threshold.  Trim any bushes or trees on the path to your home.  Use bright outdoor lighting.  Clear any walking paths of anything that might make someone trip, such as rocks or tools.  Regularly check to see if handrails are loose or broken. Make sure that both sides of any steps have handrails.  Any raised decks and porches should have guardrails on the edges.  Have any leaves, snow, or ice cleared regularly.  Use sand or  salt on walking paths during winter.  Clean up any spills in your garage right away. This includes oil or grease spills. What can I do in the bathroom?  Use night lights.  Install grab bars by the toilet and in the tub and shower. Do not use towel bars as grab bars.  Use non-skid mats or decals in the tub or shower.  If you need to sit down in the shower, use a plastic, non-slip stool.  Keep the floor dry. Clean up any water that spills on the floor as soon as it happens.  Remove soap buildup in the tub or shower regularly.  Attach bath mats securely with double-sided non-slip rug tape.  Do not have throw rugs and other things on the floor that can make you trip. What can I do in the bedroom?  Use night lights.  Make sure that you have a light by your bed that is easy to reach.  Do not use any sheets or blankets that are too big for your bed. They should not hang down onto the floor.  Have a firm chair that has side arms. You can use this for support while you get dressed.  Do not have throw rugs  and other things on the floor that can make you trip. What can I do in the kitchen?  Clean up any spills right away.  Avoid walking on wet floors.  Keep items that you use a lot in easy-to-reach places.  If you need to reach something above you, use a strong step stool that has a grab bar.  Keep electrical cords out of the way.  Do not use floor polish or wax that makes floors slippery. If you must use wax, use non-skid floor wax.  Do not have throw rugs and other things on the floor that can make you trip. What can I do with my stairs?  Do not leave any items on the stairs.  Make sure that there are handrails on both sides of the stairs and use them. Fix handrails that are broken or loose. Make sure that handrails are as long as the stairways.  Check any carpeting to make sure that it is firmly attached to the stairs. Fix any carpet that is loose or worn.  Avoid having  throw rugs at the top or bottom of the stairs. If you do have throw rugs, attach them to the floor with carpet tape.  Make sure that you have a light switch at the top of the stairs and the bottom of the stairs. If you do not have them, ask someone to add them for you. What else can I do to help prevent falls?  Wear shoes that:  Do not have high heels.  Have rubber bottoms.  Are comfortable and fit you well.  Are closed at the toe. Do not wear sandals.  If you use a stepladder:  Make sure that it is fully opened. Do not climb a closed stepladder.  Make sure that both sides of the stepladder are locked into place.  Ask someone to hold it for you, if possible.  Clearly mark and make sure that you can see:  Any grab bars or handrails.  First and last steps.  Where the edge of each step is.  Use tools that help you move around (mobility aids) if they are needed. These include:  Canes.  Walkers.  Scooters.  Crutches.  Turn on the lights when you go into a dark area. Replace any light bulbs as soon as they burn out.  Set up your furniture so you have a clear path. Avoid moving your furniture around.  If any of your floors are uneven, fix them.  If there are any pets around you, be aware of where they are.  Review your medicines with your doctor. Some medicines can make you feel dizzy. This can increase your chance of falling. Ask your doctor what other things that you can do to help prevent falls. This information is not intended to replace advice given to you by your health care provider. Make sure you discuss any questions you have with your health care provider. Document Released: 04/18/2009 Document Revised: 11/28/2015 Document Reviewed: 07/27/2014 Elsevier Interactive Patient Education  2017 [employer].

## 2016-11-09 NOTE — Progress Notes (Signed)
Name: Leah Shah   MRN: [id number]    DOB: 05/19/1956   Date:11/09/2016       Progress Note  Subjective  Chief Complaint  Chief Complaint  Patient presents with  . Follow-up    1 mo  . Medication Refill    Anxiety  Presents for follow-up visit. The problem has been unchanged. Symptoms include depressed mood, excessive worry, insomnia, nervous/anxious behavior and panic. Patient reports no chest pain, obsessions, palpitations or shortness of breath. Symptoms occur most days. The severity of symptoms is moderate and causing significant distress (mainly worried about her son who is now divorced from his wife).   Her past medical history is significant for anxiety/panic attacks. Past treatments include benzodiazephines. Compliance with prior treatments has been good.  Back Pain  This is a chronic problem. The problem occurs daily. The problem is unchanged. The pain is present in the lumbar spine and sacro-iliac. The pain is at a severity of 6/10. The symptoms are aggravated by bending and position. Pertinent negatives include no bladder incontinence, bowel incontinence, chest pain or headaches. She has tried analgesics and muscle relaxant (she took a couple of extra pills of Oxycodone to help her deal with the dental pain last week.) for the symptoms.  Hypertension  This is a chronic problem. The problem is unchanged. Associated symptoms include anxiety. Pertinent negatives include no blurred vision, chest pain, headaches, orthopnea, palpitations or shortness of breath. Past treatments include angiotensin blockers. There is no history of kidney disease, CAD/MI or CVA.     Past Medical History:  Diagnosis Date  . Anxiety   . Arthritis    R knee, L knee - OA  . Chronic diastolic CHF (congestive heart failure) ([place])    a. 06/2016 Echo: EF 60-65%, no rwma, mod MR, mildly dil LA, nl RV fxn, PASP 51mmHg.  [name] Dysrhythmia    "mild arrythmia after taking Redux diet med years ago"no cardio fi  .  Headache(784.0)   . History of stress test    a. 06/2016 MV: EF 67%, no ischemia/infarct.  . Hyperlipidemia   . Hypertension    echoJefm Bryant, wnl 2009? , had taken Redux for diet management  but now  off the market. pt, told that echo wnl.    . Moderate mitral regurgitation    a. 06/2016 Echo: EF 60-65%, mod MR.  . Morbid obesity ([place])   . Osteoarthritis   . Recurrent upper respiratory infection (URI)    treated /w OTC med.     Past Surgical History:  Procedure Laterality Date  . ABDOMINAL HYSTERECTOMY     partial-uterus removed  . CHOLECYSTECTOMY     2011  . [facility]X REMOVAL     [facility]  . EYE SURGERY     states both lens replaced. Not sure if cataract surgery or not.  [name] HEEL SPUR SURGERY    . I&D EXTREMITY  08/27/2011   Procedure: IRRIGATION AND DEBRIDEMENT EXTREMITY;  Surgeon: [name], MD;  Location: [facility];  Service: Orthopedics;  Laterality: Right;  . JOINT REPLACEMENT Right 2012   Right TKR  . KNEE ARTHROPLASTY  07/28/2011   Procedure: COMPUTER ASSISTED TOTAL KNEE ARTHROPLASTY;  Surgeon: [name], MD;  Location: [facility];  Service: Orthopedics;  Laterality: Right;  right total knee arthroplasty, computer assisted  . KNEE ARTHROSCOPY     right knee  . TMJ ARTHROPLASTY     [facility]- 1997    Family History  Problem Relation Age of  Onset  . Heart disease Mother   . Hypertension Mother   . Hypertension Father   . Heart disease Sister 33    stent   . Heart attack Sister   . Hyperlipidemia Sister   . Hypertension Sister   . Heart attack Maternal Uncle 64  . Heart disease Maternal Uncle     CABG    Social History   Social History  . Marital status: Married    Spouse name: N/A  . Number of children: N/A  . Years of education: N/A   Occupational History  . Not on file.   Social History Main Topics  . Smoking status: Current Every Day Smoker    Packs/day: 0.25    Years: 50.00    Types: Cigarettes  . Smokeless tobacco: Never Used  . Alcohol  use No  . Drug use: No  . Sexual activity: Not Currently   Other Topics Concern  . Not on file   Social History Narrative   Lives in [place] with husband.  Does not routinely exercise.     Current Outpatient Prescriptions:  .  albuterol (PROVENTIL HFA;VENTOLIN HFA) 108 (90 Base) MCG/ACT inhaler, Inhale 2 puffs into the lungs every 6 (six) hours as needed for wheezing or shortness of breath., Disp: 1 Inhaler, Rfl: 0 .  alprazolam (XANAX) 2 MG tablet, Take 1 tablet (2 mg total) by mouth 2 (two) times daily as needed for anxiety., Disp: 60 tablet, Rfl: 0 .  aspirin 81 MG EC tablet, Take 1 tablet (81 mg total) by mouth daily., Disp: 30 tablet, Rfl: 0 .  atenolol (TENORMIN) 100 MG tablet, TAKE 1 TABLET (100 MG TOTAL) BY MOUTH DAILY., Disp: 90 tablet, Rfl: 0 .  clindamycin (CLEOCIN) 300 MG capsule, Take 1 capsule (300 mg total) by mouth 4 (four) times daily., Disp: 40 capsule, Rfl: 0 .  cyclobenzaprine (FLEXERIL) 5 MG tablet, TAKE ONE AND ONE-HALF TABLETS BY MOUTH EVERY NIGHT AT BEDTIME, Disp: 45 tablet, Rfl: 2 .  fluticasone furoate-vilanterol (BREO ELLIPTA) 100-25 MCG/INH AEPB, Inhale 1 puff into the lungs daily. (Patient not taking: Reported on 11/09/2016), Disp: 30 each, Rfl: 0 .  furosemide (LASIX) 40 MG tablet, Take 1 tablet (40 mg total) by mouth 2 (two) times daily. Take 60 mg in morning, 40 mg at night (Patient taking differently: Take 40 mg by mouth 2 (two) times daily. Take 60 mg in morning, 40 mg at night), Disp: 135 tablet, Rfl: 0 .  hydrOXYzine (ATARAX/VISTARIL) 25 MG tablet, Take 1 tablet (25 mg total) by mouth 2 (two) times daily. (Patient taking differently: Take 25 mg by mouth 2 (two) times daily. ), Disp: 180 tablet, Rfl: 0 .  losartan (COZAAR) 50 MG tablet, TAKE 1 TABLET (50 MG TOTAL) BY MOUTH DAILY., Disp: 90 tablet, Rfl: 1 .  oxyCODONE (OXY IR/ROXICODONE) 5 MG immediate release tablet, Take 1 tablet (5 mg total) by mouth every 8 (eight) hours as needed for moderate pain.,  Disp: 90 tablet, Rfl: 0 .  potassium chloride SA (K-DUR,KLOR-CON) 20 MEQ tablet, Take 1 tablet (20 mEq total) by mouth 2 (two) times daily., Disp: 60 tablet, Rfl: 0 .  simvastatin (ZOCOR) 40 MG tablet, TAKE 1 TABLET (40 MG TOTAL) BY MOUTH DAILY AT 6 PM., Disp: 90 tablet, Rfl: 0 .  traZODone (DESYREL) 150 MG tablet, Take 1 tablet by mouth at bedtime., Disp: , Rfl:   Allergies  Allergen Reactions  . Bacitracin-Neomycin-Polymyxin Other (See Comments)  . Codeine Other (See Comments)  Itching "she can take some forms of it" Itching "she can take some forms of it"  . Gabapentin Nausea Only  . Neosporin  [Neomycin-Bacitracin Zn-Polymyx]   . Pregabalin Other (See Comments)    stomach issues stomach issues     Review of Systems  Eyes: Negative for blurred vision.  Respiratory: Negative for shortness of breath.   Cardiovascular: Negative for chest pain, palpitations and orthopnea.  Gastrointestinal: Negative for bowel incontinence.  Genitourinary: Negative for bladder incontinence.  Musculoskeletal: Positive for back pain.  Neurological: Negative for headaches.  Psychiatric/Behavioral: The patient is nervous/anxious and has insomnia.      Objective  There were no vitals filed for this visit.  Physical Exam  Constitutional: She is oriented to person, place, and time and well-developed, well-nourished, and in no distress.  Cardiovascular: Normal rate, regular rhythm and normal heart sounds.   No murmur heard. Pulmonary/Chest: Effort normal and breath sounds normal. She has no wheezes.  Abdominal: Soft. Bowel sounds are normal. There is no tenderness.  Musculoskeletal: She exhibits no edema.       Lumbar back: She exhibits tenderness, pain and spasm.       Back:  3+ pitting edema bilaterally  Neurological: She is alert and oriented to person, place, and time.  Psychiatric: Mood, memory, affect and judgment normal.  Nursing note and vitals reviewed.     Assessment &  Plan  1. Anxiety Stable, continue alprazolam 2 mg twice a day when necessary, patient compliant with controlled substances agreement and understands the dependence potential for side effects and drug interactions of opioids and benzodiazepines, refills provided and follow-up in one month - alprazolam (XANAX) 2 MG tablet; Take 1 tablet (2 mg total) by mouth 2 (two) times daily as needed for anxiety.  Dispense: 60 tablet; Refill: 0  2. Chronic midline low back pain without sciatica Stable, continue on opioids and muscle relaxant therapy, patient compliant with controlled substances agreement - oxyCODONE (OXY IR/ROXICODONE) 5 MG immediate release tablet; Take 1 tablet (5 mg total) by mouth every 8 (eight) hours as needed for moderate pain.  Dispense: 90 tablet; Refill: 0 - cyclobenzaprine (FLEXERIL) 5 MG tablet; TAKE ONE AND ONE-HALF TABLETS BY MOUTH EVERY NIGHT AT BEDTIME  Dispense: 45 tablet; Refill: 2  3. Benign hypertension  - losartan (COZAAR) 50 MG tablet; TAKE 1 TABLET (50 MG TOTAL) BY MOUTH DAILY.  Dispense: 90 tablet; Refill: 1  4. H/O skin pruritus  - hydrOXYzine (ATARAX/VISTARIL) 25 MG tablet; Take 1 tablet (25 mg total) by mouth 2 (two) times daily.  Dispense: 180 tablet; Refill: 0  5. Diuretic-induced hypokalemia  - potassium chloride SA (K-DUR,KLOR-CON) 20 MEQ tablet; Take 1 tablet (20 mEq total) by mouth 2 (two) times daily.  Dispense: 60 tablet; Refill: 2   Maximilian Tallo [facility] 11/09/2016 2:59 PM

## 2016-11-09 NOTE — Addendum Note (Signed)
Addended byManuella Ghazi, Juniper Cobey A A on: 11/09/2016 06:53 PM   Modules accepted: Level of Service

## 2016-11-09 NOTE — Progress Notes (Signed)
Subjective:   Leah Shah is a 61 y.o. female who presents for an Initial Medicare Annual Wellness Visit.  Review of Systems    N/A  Cardiac Risk Factors include: advanced age (>92men, >58 women);dyslipidemia;hypertension;obesity (BMI >30kg/m2);smoking/ tobacco exposure     Objective:    Today's Vitals   11/09/16 1414 11/09/16 1422  Pulse: 72   Temp: (!) 96.9 F (36.1 C)   TempSrc: Oral   Weight: 298 lb 4.8 oz (135.3 kg)   Height: 5\' 8"  (1.727 m)   PainSc: 0-No pain 0-No pain   Body mass index is 45.36 kg/m.   Current Medications (verified) Outpatient Encounter Prescriptions as of 11/09/2016  Medication Sig  . albuterol (PROVENTIL HFA;VENTOLIN HFA) 108 (90 Base) MCG/ACT inhaler Inhale 2 puffs into the lungs every 6 (six) hours as needed for wheezing or shortness of breath.  . alprazolam (XANAX) 2 MG tablet Take 1 tablet (2 mg total) by mouth 2 (two) times daily as needed for anxiety.  [name] aspirin 81 MG EC tablet Take 1 tablet (81 mg total) by mouth daily.  [name] atenolol (TENORMIN) 100 MG tablet TAKE 1 TABLET (100 MG TOTAL) BY MOUTH DAILY.  . clindamycin (CLEOCIN) 300 MG capsule Take 1 capsule (300 mg total) by mouth 4 (four) times daily.  . cyclobenzaprine (FLEXERIL) 5 MG tablet TAKE ONE AND ONE-HALF TABLETS BY MOUTH EVERY NIGHT AT BEDTIME  . furosemide (LASIX) 40 MG tablet Take 1 tablet (40 mg total) by mouth 2 (two) times daily. Take 60 mg in morning, 40 mg at night (Patient taking differently: Take 40 mg by mouth 2 (two) times daily. Take 60 mg in morning, 40 mg at night)  . hydrOXYzine (ATARAX/VISTARIL) 25 MG tablet Take 1 tablet (25 mg total) by mouth 2 (two) times daily. (Patient taking differently: Take 25 mg by mouth 2 (two) times daily. )  . losartan (COZAAR) 50 MG tablet TAKE 1 TABLET (50 MG TOTAL) BY MOUTH DAILY.  [name] oxyCODONE (OXY IR/ROXICODONE) 5 MG immediate release tablet Take 1 tablet (5 mg total) by mouth every 8 (eight) hours as needed for moderate pain.  .  potassium chloride SA (K-DUR,KLOR-CON) 20 MEQ tablet Take 1 tablet (20 mEq total) by mouth 2 (two) times daily.  . simvastatin (ZOCOR) 40 MG tablet TAKE 1 TABLET (40 MG TOTAL) BY MOUTH DAILY AT 6 PM.  . traZODone (DESYREL) 150 MG tablet Take 1 tablet by mouth at bedtime.  . fluticasone furoate-vilanterol (BREO ELLIPTA) 100-25 MCG/INH AEPB Inhale 1 puff into the lungs daily. (Patient not taking: Reported on 11/09/2016)   No facility-administered encounter medications on file as of 11/09/2016.     Allergies (verified) Bacitracin-neomycin-polymyxin; Codeine; Gabapentin; Neosporin  [neomycin-bacitracin zn-polymyx]; and Pregabalin   History: Past Medical History:  Diagnosis Date  . Anxiety   . Arthritis    R knee, L knee - OA  . Chronic diastolic CHF (congestive heart failure) ([place])    a. 06/2016 Echo: EF 60-65%, no rwma, mod MR, mildly dil LA, nl RV fxn, PASP 53mmHg.  [name] Dysrhythmia    "mild arrythmia after taking Redux diet med years ago"no cardio fi  . Headache(784.0)   . History of stress test    a. 06/2016 MV: EF 67%, no ischemia/infarct.  . Hyperlipidemia   . Hypertension    echoJefm Bryant, wnl 2009? , had taken Redux for diet management  but now  off the market. pt, told that echo wnl.    . Moderate mitral regurgitation  a. 06/2016 Echo: EF 60-65%, mod MR.  . Morbid obesity ([place])   . Osteoarthritis   . Recurrent upper respiratory infection (URI)    treated /w OTC med.    Past Surgical History:  Procedure Laterality Date  . ABDOMINAL HYSTERECTOMY     partial-uterus removed  . CHOLECYSTECTOMY     2011  . COCCYX REMOVAL     [facility]  . EYE SURGERY     states both lens replaced. Not sure if cataract surgery or not.  [name] HEEL SPUR SURGERY    . I&D EXTREMITY  08/27/2011   Procedure: IRRIGATION AND DEBRIDEMENT EXTREMITY;  Surgeon: [name], MD;  Location: [place];  Service: Orthopedics;  Laterality: Right;  . JOINT REPLACEMENT Right 2012   Right TKR  . KNEE  ARTHROPLASTY  07/28/2011   Procedure: COMPUTER ASSISTED TOTAL KNEE ARTHROPLASTY;  Surgeon: [name], MD;  Location: [place];  Service: Orthopedics;  Laterality: Right;  right total knee arthroplasty, computer assisted  . KNEE ARTHROSCOPY     right knee  . TMJ ARTHROPLASTY     [facility]- 1997   Family History  Problem Relation Age of Onset  . Heart disease Mother   . Hypertension Mother   . Hypertension Father   . Heart disease Sister 13    stent   . Heart attack Sister   . Hyperlipidemia Sister   . Hypertension Sister   . Heart attack Maternal Uncle 64  . Heart disease Maternal Uncle     CABG   Social History   Occupational History  . Not on file.   Social History Main Topics  . Smoking status: Current Every Day Smoker    Packs/day: 0.25    Years: 50.00    Types: Cigarettes  . Smokeless tobacco: Never Used  . Alcohol use No  . Drug use: No  . Sexual activity: Not Currently    Tobacco Counseling Ready to quit: Yes Counseling given: No   Activities of Daily Living In your present state of health, do you have any difficulty performing the following activities: 11/09/2016 11/09/2016  Hearing? N N  Vision? Y Y  Difficulty concentrating or making decisions? Y N  Walking or climbing stairs? Y Y  Dressing or bathing? N N  Doing errands, shopping? N N  Preparing Food and eating ? N -  Using the Toilet? N -  In the past six months, have you accidently leaked urine? Y -  Managing your Medications? N -  Managing your Finances? Y -  Housekeeping or managing your Housekeeping? Y -  Some recent data might be hidden    Immunizations and Health Maintenance Immunization History  Administered Date(s) Administered  . Influenza,inj,Quad PF,36+ Mos 05/16/2015, 06/01/2016  . Pneumococcal Polysaccharide-23 08/28/2016   There are no preventive care reminders to display for this patient.  Patient Care Team: Roselee Nova, MD as PCP - General (Family Medicine) End,  Harrell Gave, MD as Consulting Physician (Cardiology)  Indicate any recent Medical Services you may have received from other than [facility] providers in the past year (date may be approximate).     Assessment:   This is a routine wellness examination for Xuan.   Hearing/Vision screen Vision Screening Comments: Pt does not have vision checks on a regular basis. Last check was > 3 years ago.  Dietary issues and exercise activities discussed: Current Exercise Habits: The patient does not participate in regular exercise at present, Exercise limited by: None identified  Goals    . Exercise 3x per week (30 min per time)          Recommend exercising 3 days a week for 30 minutes at a time.      Depression Screen PHQ 2/9 Scores 11/09/2016 11/09/2016 11/06/2016 10/12/2016 09/08/2016 06/01/2016 03/30/2016  PHQ - 2 Score 1 0 0 0 0 0 0  PHQ- 9 Score - - - - - - -    Fall Risk Fall Risk  11/09/2016 11/09/2016 11/06/2016 10/12/2016 09/08/2016  Falls in the past year? Yes No No No No  Number falls in past yr: 1 - - - -  Injury with Fall? No - - - -  Follow up Falls prevention discussed - - - -    Cognitive Function:     6CIT Screen 11/09/2016  What Year? 0 points  What month? 0 points  What time? 0 points  Count back from 20 0 points  Months in reverse 0 points  Repeat phrase 4 points  Total Score 4    Screening Tests Health Maintenance  Topic Date Due  . MAMMOGRAM  11/03/2017 (Originally 08/23/2004)  . PAP SMEAR  07/06/2026 (Originally 08/24/1975)  . TETANUS/TDAP  07/06/2026 (Originally 08/23/1973)  . INFLUENZA VACCINE  02/03/2017  . COLONOSCOPY  09/23/2021  . Hepatitis C Screening  Completed  . HIV Screening  Completed      Plan:  I have personally reviewed and addressed the Medicare Annual Wellness questionnaire and have noted the following in the patient's chart:  A. Medical and social history B. Use of alcohol, tobacco or illicit drugs  C. Current medications and supplements D. Functional  ability and status E.  Nutritional status F.  Physical activity G. Advance directives H. List of other physicians I.  Hospitalizations, surgeries, and ER visits in previous 12 months J.  [place] such as hearing and vision if needed, cognitive and depression L. Referrals and appointments - none  In addition, I have reviewed and discussed with patient certain preventive protocols, quality metrics, and best practice recommendations. A written personalized care plan for preventive services as well as general preventive health recommendations were provided to patient.  See attached scanned questionnaire for additional information.   Signed,  Fabio Neighbors, LPN Nurse Health Advisor   MD Recommendations: Pt is going to call this year to set up a mammogram. Pt declined the tetanus vaccine and pap smear today.  I, as supervising physician, have reviewed the nurse health advisor's Medicare Wellness Visit note for this patient and concur with the findings and recommendations listed above.  Signed Syed Asad A. Manuella Ghazi MD Attending Physician.

## 2016-11-10 ENCOUNTER — Ambulatory Visit: Payer: Medicare HMO | Admitting: Cardiology

## 2016-11-11 ENCOUNTER — Other Ambulatory Visit: Payer: Self-pay | Admitting: Family Medicine

## 2016-11-11 DIAGNOSIS — Z872 Personal history of diseases of the skin and subcutaneous tissue: Secondary | ICD-10-CM

## 2016-11-11 MED ORDER — HYDROXYZINE HCL 25 MG PO TABS: 25.0000 mg | ORAL_TABLET | Freq: Three times a day (TID) | ORAL | 0 refills | Status: DC | PRN

## 2016-12-08 ENCOUNTER — Ambulatory Visit: Payer: Medicare HMO | Admitting: Internal Medicine

## 2016-12-10 ENCOUNTER — Ambulatory Visit (INDEPENDENT_AMBULATORY_CARE_PROVIDER_SITE_OTHER): Payer: Medicare HMO | Admitting: Family Medicine

## 2016-12-10 VITALS — BP 116/76 | HR 77 | Temp 97.6°F | Resp 16 | Ht 68.0 in | Wt 294.1 lb

## 2016-12-10 DIAGNOSIS — M545 Low back pain, unspecified: Secondary | ICD-10-CM

## 2016-12-10 DIAGNOSIS — G8929 Other chronic pain: Secondary | ICD-10-CM | POA: Diagnosis not present

## 2016-12-10 DIAGNOSIS — F419 Anxiety disorder, unspecified: Secondary | ICD-10-CM | POA: Diagnosis not present

## 2016-12-10 DIAGNOSIS — I5032 Chronic diastolic (congestive) heart failure: Secondary | ICD-10-CM | POA: Diagnosis not present

## 2016-12-10 MED ORDER — OXYCODONE HCL 5 MG PO TABS: 5.0000 mg | ORAL_TABLET | Freq: Three times a day (TID) | ORAL | 0 refills | Status: DC | PRN

## 2016-12-10 MED ORDER — ALPRAZOLAM 2 MG PO TABS: 2.0000 mg | ORAL_TABLET | Freq: Two times a day (BID) | ORAL | 0 refills | Status: DC | PRN

## 2016-12-10 NOTE — Progress Notes (Signed)
Name: Leah Shah   MRN: [id number]    DOB: [birth year]   Date:12/10/2016       Progress Note  Subjective  Chief Complaint  Chief Complaint  Patient presents with  . Congestive Heart Failure    1 month follow up  . Back Pain  . Knee Pain    Congestive Heart Failure  Presents for follow-up visit. Associated symptoms include edema and shortness of breath (with activity, she has gone back to smoking). Pertinent negatives include no chest pain, chest pressure, fatigue or orthopnea. The symptoms have been stable.  Back Pain  This is a chronic problem. The problem is unchanged. The pain is present in the sacro-iliac and lumbar spine. The pain is at a severity of 5/10. The pain is worse during the day. Pertinent negatives include no bladder incontinence, bowel incontinence, chest pain, leg pain or numbness. She has tried analgesics for the symptoms.  Anxiety  Presents for follow-up visit. Symptoms include depressed mood, excessive worry, insomnia, muscle tension, nervous/anxious behavior, panic and shortness of breath (with activity, she has gone back to smoking). Patient reports no chest pain. The severity of symptoms is moderate and causing significant distress. The quality of sleep is fair.      Past Medical History:  Diagnosis Date  . Anxiety   . Arthritis    R knee, L knee - OA  . Chronic diastolic CHF (congestive heart failure) ([place])    a. 06/2016 Echo: EF 60-65%, no rwma, mod MR, mildly dil LA, nl RV fxn, PASP 66mmHg.  [name] Dysrhythmia    "mild arrythmia after taking Redux diet med years ago"no cardio fi  . Headache(784.0)   . History of stress test    a. 06/2016 MV: EF 67%, no ischemia/infarct.  . Hyperlipidemia   . Hypertension    echoJefm Bryant, wnl 2009? , had taken Redux for diet management  but now  off the market. pt, told that echo wnl.    . Moderate mitral regurgitation    a. 06/2016 Echo: EF 60-65%, mod MR.  . Morbid obesity ([place])   . Osteoarthritis   . Recurrent  upper respiratory infection (URI)    treated /w OTC med.     Past Surgical History:  Procedure Laterality Date  . ABDOMINAL HYSTERECTOMY     partial-uterus removed  . CHOLECYSTECTOMY     2011  . COCCYX REMOVAL     [facility]  . EYE SURGERY     states both lens replaced. Not sure if cataract surgery or not.  [name] HEEL SPUR SURGERY    . I&D EXTREMITY  08/27/2011   Procedure: IRRIGATION AND DEBRIDEMENT EXTREMITY;  Surgeon: [name], MD;  Location: [place];  Service: Orthopedics;  Laterality: Right;  . JOINT REPLACEMENT Right 2012   Right TKR  . KNEE ARTHROPLASTY  07/28/2011   Procedure: COMPUTER ASSISTED TOTAL KNEE ARTHROPLASTY;  Surgeon: [name], MD;  Location: [place];  Service: Orthopedics;  Laterality: Right;  right total knee arthroplasty, computer assisted  . KNEE ARTHROSCOPY     right knee  . TMJ ARTHROPLASTY     [facility]- 1997    Family History  Problem Relation Age of Onset  . Heart disease Mother   . Hypertension Mother   . Hypertension Father   . Heart disease Sister 77       stent   . Heart attack Sister   . Hyperlipidemia Sister   . Hypertension Sister   . Heart attack  Maternal Uncle 64  . Heart disease Maternal Uncle        CABG    Social History   Social History  . Marital status: Married    Spouse name: N/A  . Number of children: N/A  . Years of education: N/A   Occupational History  . Not on file.   Social History Main Topics  . Smoking status: Current Every Day Smoker    Packs/day: 0.25    Years: 50.00    Types: Cigarettes  . Smokeless tobacco: Never Used  . Alcohol use No  . Drug use: No  . Sexual activity: Not Currently   Other Topics Concern  . Not on file   Social History Narrative   Lives in [place] with husband.  Does not routinely exercise.     Current Outpatient Prescriptions:  .  albuterol (PROVENTIL HFA;VENTOLIN HFA) 108 (90 Base) MCG/ACT inhaler, Inhale 2 puffs into the lungs every 6 (six) hours as needed  for wheezing or shortness of breath., Disp: 1 Inhaler, Rfl: 0 .  alprazolam (XANAX) 2 MG tablet, Take 1 tablet (2 mg total) by mouth 2 (two) times daily as needed for anxiety., Disp: 60 tablet, Rfl: 0 .  aspirin 81 MG EC tablet, Take 1 tablet (81 mg total) by mouth daily., Disp: 30 tablet, Rfl: 0 .  atenolol (TENORMIN) 100 MG tablet, TAKE 1 TABLET (100 MG TOTAL) BY MOUTH DAILY., Disp: 90 tablet, Rfl: 0 .  cyclobenzaprine (FLEXERIL) 5 MG tablet, TAKE ONE AND ONE-HALF TABLETS BY MOUTH EVERY NIGHT AT BEDTIME, Disp: 45 tablet, Rfl: 2 .  furosemide (LASIX) 40 MG tablet, Take 1 tablet (40 mg total) by mouth 2 (two) times daily. Take 60 mg in morning, 40 mg at night (Patient taking differently: Take 40 mg by mouth 2 (two) times daily. Take 60 mg in morning, 40 mg at night), Disp: 135 tablet, Rfl: 0 .  hydrOXYzine (ATARAX/VISTARIL) 25 MG tablet, Take 1 tablet (25 mg total) by mouth every 8 (eight) hours as needed., Disp: 270 tablet, Rfl: 0 .  losartan (COZAAR) 50 MG tablet, TAKE 1 TABLET (50 MG TOTAL) BY MOUTH DAILY., Disp: 90 tablet, Rfl: 1 .  oxyCODONE (OXY IR/ROXICODONE) 5 MG immediate release tablet, Take 1 tablet (5 mg total) by mouth every 8 (eight) hours as needed for moderate pain., Disp: 90 tablet, Rfl: 0 .  potassium chloride SA (K-DUR,KLOR-CON) 20 MEQ tablet, Take 1 tablet (20 mEq total) by mouth 2 (two) times daily., Disp: 60 tablet, Rfl: 2 .  simvastatin (ZOCOR) 40 MG tablet, TAKE 1 TABLET (40 MG TOTAL) BY MOUTH DAILY AT 6 PM., Disp: 90 tablet, Rfl: 0 .  traZODone (DESYREL) 150 MG tablet, Take 1 tablet by mouth at bedtime., Disp: , Rfl:   Allergies  Allergen Reactions  . Bacitracin-Neomycin-Polymyxin Other (See Comments)  . Codeine Other (See Comments)    Itching "she can take some forms of it" Itching "she can take some forms of it"  . Gabapentin Nausea Only  . Neosporin  [Neomycin-Bacitracin Zn-Polymyx]   . Pregabalin Other (See Comments)    stomach issues stomach issues     Review  of Systems  Constitutional: Negative for fatigue.  Respiratory: Positive for shortness of breath (with activity, she has gone back to smoking).   Cardiovascular: Negative for chest pain.  Gastrointestinal: Negative for bowel incontinence.  Genitourinary: Negative for bladder incontinence.  Musculoskeletal: Positive for back pain.  Neurological: Negative for numbness.  Psychiatric/Behavioral: The patient is nervous/anxious and has  insomnia.     Objective  Vitals:   12/10/16 1351  BP: 116/76  Pulse: 77  Resp: 16  Temp: 97.6 F (36.4 C)  SpO2: 92%  Weight: 294 lb 2 oz (133.4 kg)  Height: 5\' 8"  (1.727 m)    Physical Exam  Constitutional: She is oriented to person, place, and time and well-developed, well-nourished, and in no distress.  HENT:  Head: Normocephalic and atraumatic.  Cardiovascular: Normal rate, regular rhythm and normal heart sounds.   No murmur heard. Pulmonary/Chest: Effort normal and breath sounds normal. She has no wheezes.  Abdominal: Soft. Bowel sounds are normal. There is no tenderness.  Musculoskeletal: She exhibits edema (3+ pitting edema in lower extremities).       Lumbar back: She exhibits tenderness, pain and spasm.       Back:  Neurological: She is alert and oriented to person, place, and time.  Psychiatric: Mood, memory, affect and judgment normal.  Nursing note and vitals reviewed.      Assessment & Plan  1. Chronic diastolic CHF (congestive heart failure) ([place]) Being followed by cardiology, taking high-dose Lasix with marginal improvement in leg swelling, has an appointment in the near future  2. Chronic midline low back pain without sciatica Chronic low back pain controlled on oxycodone taken every 8 hours as needed. Patient compliant with controlled substance agreement. Refills provided - oxyCODONE (OXY IR/ROXICODONE) 5 MG immediate release tablet; Take 1 tablet (5 mg total) by mouth every 8 (eight) hours as needed for moderate pain.   Dispense: 90 tablet; Refill: 0  3. Anxiety Symptoms stable, continue on alprazolam taken twice a day when necessary. Patient compliant with controlled substances agreement and understands the dependence potential, side effects and drug interactions of benzodiazepines especially with opioids. Refills provided - alprazolam (XANAX) 2 MG tablet; Take 1 tablet (2 mg total) by mouth 2 (two) times daily as needed for anxiety.  Dispense: 60 tablet; Refill: 0   Julieth Tugman [facility] 12/10/2016 2:02 PM

## 2016-12-26 ENCOUNTER — Other Ambulatory Visit: Payer: Self-pay | Admitting: Family Medicine

## 2016-12-26 DIAGNOSIS — E785 Hyperlipidemia, unspecified: Secondary | ICD-10-CM

## 2016-12-26 DIAGNOSIS — I10 Essential (primary) hypertension: Secondary | ICD-10-CM

## 2017-01-07 ENCOUNTER — Ambulatory Visit (INDEPENDENT_AMBULATORY_CARE_PROVIDER_SITE_OTHER): Payer: Medicare HMO | Admitting: Family Medicine

## 2017-01-07 ENCOUNTER — Encounter: Payer: Self-pay | Admitting: Family Medicine

## 2017-01-07 VITALS — BP 140/84 | HR 73 | Temp 98.0°F | Resp 16 | Ht 68.0 in | Wt 307.7 lb

## 2017-01-07 DIAGNOSIS — M25471 Effusion, right ankle: Secondary | ICD-10-CM

## 2017-01-07 DIAGNOSIS — F419 Anxiety disorder, unspecified: Secondary | ICD-10-CM

## 2017-01-07 DIAGNOSIS — M545 Low back pain, unspecified: Secondary | ICD-10-CM

## 2017-01-07 DIAGNOSIS — G8929 Other chronic pain: Secondary | ICD-10-CM

## 2017-01-07 DIAGNOSIS — M25476 Effusion, unspecified foot: Secondary | ICD-10-CM

## 2017-01-07 DIAGNOSIS — M25473 Effusion, unspecified ankle: Secondary | ICD-10-CM | POA: Diagnosis not present

## 2017-01-07 DIAGNOSIS — Z872 Personal history of diseases of the skin and subcutaneous tissue: Secondary | ICD-10-CM

## 2017-01-07 LAB — HEPATIC FUNCTION PANEL
ALBUMIN: 2.8 g/dL — AB (ref 3.6–5.1)
ALK PHOS: 155 U/L — AB (ref 33–130)
ALT: 20 U/L (ref 6–29)
AST: 37 U/L — AB (ref 10–35)
Bilirubin, Direct: 1.8 mg/dL — ABNORMAL HIGH (ref <=0.2)
Indirect Bilirubin: 2.5 mg/dL — ABNORMAL HIGH (ref 0.2–1.2)
TOTAL PROTEIN: 5.9 g/dL — AB (ref 6.1–8.1)
Total Bilirubin: 4.3 mg/dL — ABNORMAL HIGH (ref 0.2–1.2)

## 2017-01-07 LAB — BASIC METABOLIC PANEL WITH GFR
BUN: 17 mg/dL (ref 7–25)
CHLORIDE: 107 mmol/L (ref 98–110)
CO2: 22 mmol/L (ref 20–31)
Calcium: 8.2 mg/dL — ABNORMAL LOW (ref 8.6–10.4)
Creat: 0.82 mg/dL (ref 0.50–0.99)
GFR, Est African American: 89 mL/min (ref >=60)
GFR, Est Non African American: 77 mL/min (ref >=60)
GLUCOSE: 100 mg/dL — AB (ref 65–99)
POTASSIUM: 4.3 mmol/L (ref 3.5–5.3)
Sodium: 139 mmol/L (ref 135–146)

## 2017-01-07 MED ORDER — OXYCODONE HCL 5 MG PO TABS: 5.0000 mg | ORAL_TABLET | Freq: Three times a day (TID) | ORAL | 0 refills | Status: DC | PRN

## 2017-01-07 MED ORDER — HYDROXYZINE HCL 25 MG PO TABS: 25.0000 mg | ORAL_TABLET | Freq: Three times a day (TID) | ORAL | 0 refills | Status: DC | PRN

## 2017-01-07 MED ORDER — ALPRAZOLAM 2 MG PO TABS: 2.0000 mg | ORAL_TABLET | Freq: Two times a day (BID) | ORAL | 0 refills | Status: DC | PRN

## 2017-01-07 NOTE — Progress Notes (Signed)
Name: Leah Shah   MRN: [id number]    DOB: [birth year]   Date:01/07/2017       Progress Note  Subjective  Chief Complaint  Chief Complaint  Patient presents with  . Follow-up    1 month F/U  . Back Pain    Unchanged, needs refill  . Edema    Bilateral Feet Edema, tight and painful. Seeing Cardiologist tomorrow    Back Pain  This is a chronic problem. The problem occurs daily. The problem is unchanged. The pain is present in the lumbar spine and sacro-iliac. The pain is at a severity of 5/10. The symptoms are aggravated by bending and position. Pertinent negatives include no bladder incontinence or bowel incontinence. She has tried analgesics and muscle relaxant for the symptoms.  Anxiety  Presents for follow-up visit. The problem has been unchanged. Symptoms include depressed mood, excessive worry, insomnia, nervous/anxious behavior and panic. Patient reports no obsessions. Symptoms occur most days. The severity of symptoms is moderate and causing significant distress.   Her past medical history is significant for anxiety/panic attacks. Past treatments include benzodiazephines. Compliance with prior treatments has been good.   Patient has persistent swelling in both feet and ankles, diagnosed with diastolic CHF, on Lasix but swelling is still persistent. Shge has an appointment with Cardiology follow up tomorrow.    Past Medical History:  Diagnosis Date  . Anxiety   . Arthritis    R knee, L knee - OA  . Chronic diastolic CHF (congestive heart failure) ([place])    a. 06/2016 Echo: EF 60-65%, no rwma, mod MR, mildly dil LA, nl RV fxn, PASP 30mmHg.  [name] Dysrhythmia    "mild arrythmia after taking Redux diet med years ago"no cardio fi  . Headache(784.0)   . History of stress test    a. 06/2016 MV: EF 67%, no ischemia/infarct.  . Hyperlipidemia   . Hypertension    echoJefm Bryant, wnl 2009? , had taken Redux for diet management  but now  off the market. pt, told that echo wnl.    .  Moderate mitral regurgitation    a. 06/2016 Echo: EF 60-65%, mod MR.  . Morbid obesity ([place])   . Osteoarthritis   . Recurrent upper respiratory infection (URI)    treated /w OTC med.     Past Surgical History:  Procedure Laterality Date  . ABDOMINAL HYSTERECTOMY     partial-uterus removed  . CHOLECYSTECTOMY     2011  . COCCYX REMOVAL     [facility]  . EYE SURGERY     states both lens replaced. Not sure if cataract surgery or not.  [name] HEEL SPUR SURGERY    . I&D EXTREMITY  08/27/2011   Procedure: IRRIGATION AND DEBRIDEMENT EXTREMITY;  Surgeon: [name], MD;  Location: [place];  Service: Orthopedics;  Laterality: Right;  . JOINT REPLACEMENT Right 2012   Right TKR  . KNEE ARTHROPLASTY  07/28/2011   Procedure: COMPUTER ASSISTED TOTAL KNEE ARTHROPLASTY;  Surgeon: [name], MD;  Location: [place];  Service: Orthopedics;  Laterality: Right;  right total knee arthroplasty, computer assisted  . KNEE ARTHROSCOPY     right knee  . TMJ ARTHROPLASTY     [facility]- 1997    Family History  Problem Relation Age of Onset  . Heart disease Mother   . Hypertension Mother   . Hypertension Father   . Heart disease Sister 18       stent   . Heart attack  Sister   . Hyperlipidemia Sister   . Hypertension Sister   . Heart attack Maternal Uncle 64  . Heart disease Maternal Uncle        CABG    Social History   Social History  . Marital status: Married    Spouse name: N/A  . Number of children: N/A  . Years of education: N/A   Occupational History  . Not on file.   Social History Main Topics  . Smoking status: Current Every Day Smoker    Packs/day: 0.25    Years: 50.00    Types: Cigarettes  . Smokeless tobacco: Never Used  . Alcohol use No  . Drug use: No  . Sexual activity: Not Currently   Other Topics Concern  . Not on file   Social History Narrative   Lives in [place] with husband.  Does not routinely exercise.     Current Outpatient Prescriptions:  .   albuterol (PROVENTIL HFA;VENTOLIN HFA) 108 (90 Base) MCG/ACT inhaler, Inhale 2 puffs into the lungs every 6 (six) hours as needed for wheezing or shortness of breath., Disp: 1 Inhaler, Rfl: 0 .  alprazolam (XANAX) 2 MG tablet, Take 1 tablet (2 mg total) by mouth 2 (two) times daily as needed for anxiety., Disp: 60 tablet, Rfl: 0 .  aspirin 81 MG EC tablet, Take 1 tablet (81 mg total) by mouth daily., Disp: 30 tablet, Rfl: 0 .  atenolol (TENORMIN) 100 MG tablet, TAKE 1 TABLET (100 MG TOTAL) BY MOUTH DAILY., Disp: 90 tablet, Rfl: 0 .  cyclobenzaprine (FLEXERIL) 5 MG tablet, TAKE ONE AND ONE-HALF TABLETS BY MOUTH EVERY NIGHT AT BEDTIME, Disp: 45 tablet, Rfl: 2 .  furosemide (LASIX) 40 MG tablet, Take 1 tablet (40 mg total) by mouth 2 (two) times daily. Take 60 mg in morning, 40 mg at night (Patient taking differently: Take 40 mg by mouth daily. Take 60 mg in morning, 40 mg at night), Disp: 135 tablet, Rfl: 0 .  hydrOXYzine (ATARAX/VISTARIL) 25 MG tablet, Take 1 tablet (25 mg total) by mouth every 8 (eight) hours as needed., Disp: 270 tablet, Rfl: 0 .  losartan (COZAAR) 50 MG tablet, TAKE 1 TABLET (50 MG TOTAL) BY MOUTH DAILY., Disp: 90 tablet, Rfl: 1 .  oxyCODONE (OXY IR/ROXICODONE) 5 MG immediate release tablet, Take 1 tablet (5 mg total) by mouth every 8 (eight) hours as needed for moderate pain., Disp: 90 tablet, Rfl: 0 .  potassium chloride SA (K-DUR,KLOR-CON) 20 MEQ tablet, Take 1 tablet (20 mEq total) by mouth 2 (two) times daily. (Patient taking differently: Take 20 mEq by mouth once. ), Disp: 60 tablet, Rfl: 2 .  simvastatin (ZOCOR) 40 MG tablet, TAKE 1 TABLET (40 MG TOTAL) BY MOUTH DAILY AT 6 PM., Disp: 90 tablet, Rfl: 0 .  traZODone (DESYREL) 150 MG tablet, Take 1 tablet by mouth at bedtime., Disp: , Rfl:   Allergies  Allergen Reactions  . Bacitracin-Neomycin-Polymyxin Other (See Comments)  . Codeine Other (See Comments)    Itching "she can take some forms of it" Itching "she can take some  forms of it"  . Gabapentin Nausea Only  . Neosporin  [Neomycin-Bacitracin Zn-Polymyx]   . Pregabalin Other (See Comments)    stomach issues stomach issues     Review of Systems  Gastrointestinal: Negative for bowel incontinence.  Genitourinary: Negative for bladder incontinence.  Musculoskeletal: Positive for back pain.  Psychiatric/Behavioral: The patient is nervous/anxious and has insomnia.      Objective  Vitals:  01/07/17 0926  BP: 140/84  Pulse: 73  Resp: 16  Temp: 98 F (36.7 C)  TempSrc: Oral  SpO2: 91%  Weight: (!) 307 lb 11.2 oz (139.6 kg)  Height: 5\' 8"  (1.727 m)    Physical Exam  Constitutional: She is oriented to person, place, and time and well-developed, well-nourished, and in no distress.  HENT:  Head: Normocephalic and atraumatic.  Cardiovascular: Normal rate, regular rhythm and normal heart sounds.   No murmur heard. Pulmonary/Chest: Effort normal and breath sounds normal. She has no wheezes.  Abdominal: Soft. Bowel sounds are normal. There is no tenderness.  Musculoskeletal: She exhibits edema (generalized 3+ pitting edema).       Lumbar back: She exhibits tenderness and pain.       Back:  Neurological: She is alert and oriented to person, place, and time.  Psychiatric: Mood, memory, affect and judgment normal.  Nursing note and vitals reviewed.      Assessment & Plan  1. Bilateral swelling of feet and ankles Obtain liver enzymes, albumin and ultrasound of right upper quadrant rule out fatty liver disease as the source of patient's swelling - BASIC METABOLIC PANEL WITH GFR - Hepatic function panel - US Abdomen Limited RUQ; Future  2. Chronic midline low back pain without sciatica Stable and responsive to opioid therapy, patient compliant with controlled substances agreement - oxyCODONE (OXY IR/ROXICODONE) 5 MG immediate release tablet; Take 1 tablet (5 mg total) by mouth every 8 (eight) hours as needed for moderate pain.  Dispense: 90  tablet; Refill: 0  3. H/O skin pruritus  - hydrOXYzine (ATARAX/VISTARIL) 25 MG tablet; Take 1 tablet (25 mg total) by mouth every 8 (eight) hours as needed.  Dispense: 270 tablet; Refill: 0  4. Anxiety  - alprazolam (XANAX) 2 MG tablet; Take 1 tablet (2 mg total) by mouth 2 (two) times daily as needed for anxiety.  Dispense: 60 tablet; Refill: 0   Lyndall Windt Asad A[facility] 01/07/2017 9:39 AM

## 2017-01-08 ENCOUNTER — Ambulatory Visit: Payer: Medicare HMO | Admitting: Family Medicine

## 2017-01-08 ENCOUNTER — Ambulatory Visit
Admission: RE | Admit: 2017-01-08 | Discharge: 2017-01-08 | Disposition: A | Discharge status: Home / Self Care | Payer: Medicare HMO | Source: Ambulatory Visit | Attending: Family Medicine | Admitting: Family Medicine

## 2017-01-08 DIAGNOSIS — K7689 Other specified diseases of liver: Secondary | ICD-10-CM | POA: Diagnosis not present

## 2017-01-08 DIAGNOSIS — M25475 Effusion, left foot: Secondary | ICD-10-CM

## 2017-01-08 DIAGNOSIS — M25471 Effusion, right ankle: Secondary | ICD-10-CM | POA: Insufficient documentation

## 2017-01-08 DIAGNOSIS — M25473 Effusion, unspecified ankle: Secondary | ICD-10-CM

## 2017-01-08 DIAGNOSIS — M25472 Effusion, left ankle: Secondary | ICD-10-CM | POA: Insufficient documentation

## 2017-01-08 DIAGNOSIS — M7989 Other specified soft tissue disorders: Secondary | ICD-10-CM | POA: Diagnosis not present

## 2017-01-08 DIAGNOSIS — R932 Abnormal findings on diagnostic imaging of liver and biliary tract: Secondary | ICD-10-CM | POA: Diagnosis not present

## 2017-01-08 DIAGNOSIS — M25476 Effusion, unspecified foot: Secondary | ICD-10-CM

## 2017-01-11 ENCOUNTER — Telehealth: Payer: Self-pay | Admitting: Family Medicine

## 2017-01-11 DIAGNOSIS — R748 Abnormal levels of other serum enzymes: Secondary | ICD-10-CM | POA: Insufficient documentation

## 2017-01-11 DIAGNOSIS — K7460 Unspecified cirrhosis of liver: Secondary | ICD-10-CM | POA: Insufficient documentation

## 2017-01-11 NOTE — Telephone Encounter (Signed)
Pt is scheduled °

## 2017-01-11 NOTE — Telephone Encounter (Signed)
I called patient, talked with her about abnormal labs and [place] She saw Dr. Manuella Ghazi on July 5th Her legs and feet are swelling STOP statin (simvastatin), STOP cholesterol medicine No alcohol, no tylenol No known hx of liver problems No hx of alcohol other than an occasional drink No fam hx of liver disease I'll recommend half of current dose of xanax only if needed I do not recommend taking oxycodone plus the xanax, danger of accidental overdose which can be fatal Caution to not take cyclobenzaprine (flexeril) Reasons to go to ER dicsussed --------------------------------------- Melissa, please schedule patient to see Dr. Manuella Ghazi later this week

## 2017-01-13 ENCOUNTER — Ambulatory Visit: Payer: Medicare HMO | Admitting: Family Medicine

## 2017-02-04 ENCOUNTER — Ambulatory Visit (INDEPENDENT_AMBULATORY_CARE_PROVIDER_SITE_OTHER): Payer: Medicare HMO | Admitting: Family Medicine

## 2017-02-04 ENCOUNTER — Encounter: Payer: Self-pay | Admitting: Family Medicine

## 2017-02-04 VITALS — BP 139/78 | HR 84 | Temp 97.6°F | Resp 17 | Ht 68.0 in | Wt 307.7 lb

## 2017-02-04 DIAGNOSIS — G8929 Other chronic pain: Secondary | ICD-10-CM

## 2017-02-04 DIAGNOSIS — K760 Fatty (change of) liver, not elsewhere classified: Secondary | ICD-10-CM

## 2017-02-04 DIAGNOSIS — M545 Low back pain, unspecified: Secondary | ICD-10-CM

## 2017-02-04 DIAGNOSIS — F419 Anxiety disorder, unspecified: Secondary | ICD-10-CM

## 2017-02-04 DIAGNOSIS — M25562 Pain in left knee: Secondary | ICD-10-CM | POA: Diagnosis not present

## 2017-02-04 DIAGNOSIS — M25462 Effusion, left knee: Secondary | ICD-10-CM

## 2017-02-04 MED ORDER — OXYCODONE HCL 5 MG PO TABS: 5.0000 mg | ORAL_TABLET | Freq: Three times a day (TID) | ORAL | 0 refills | Status: DC | PRN

## 2017-02-04 MED ORDER — CITALOPRAM HYDROBROMIDE 10 MG PO TABS: 10.0000 mg | ORAL_TABLET | Freq: Every day | ORAL | 0 refills | Status: AC

## 2017-02-04 MED ORDER — ALPRAZOLAM 2 MG PO TABS: 2.0000 mg | ORAL_TABLET | Freq: Two times a day (BID) | ORAL | 0 refills | Status: DC | PRN

## 2017-02-04 NOTE — Progress Notes (Signed)
Name: Leah Shah   MRN: [id number]    DOB: 1955-11-20   Date:02/04/2017       Progress Note  Subjective  Chief Complaint  Chief Complaint  Patient presents with  . Follow-up    1 mo  . Medication Refill    Anxiety  Presents for follow-up visit. Symptoms include depressed mood, excessive worry, insomnia, nervous/anxious behavior and panic. Primary symptoms comment: frequent crying spells. The severity of symptoms is moderate and causing significant distress.    Back Pain  This is a chronic problem. The pain is present in the lumbar spine and sacro-iliac. The quality of the pain is described as shooting. The pain is at a severity of 6/10. The symptoms are aggravated by bending and position. She has tried analgesics for the symptoms.   Fatty Liver Disease: Pt. has fatty liver disease evidenced from her lab work showing elevated liver enzymes, below normal albumin, and elevated bilirubin, she has ultrasound evidence of fatty infiltration, experiencing persistent bilateral leg swelling uncontrolled on Lasix. She has been complaining of bruising on her hands, has been referred to liver specialist and hs an appointment in 2 weeks.   Past Medical History:  Diagnosis Date  . Anxiety   . Arthritis    R knee, L knee - OA  . Chronic diastolic CHF (congestive heart failure) ([place])    a. 06/2016 Echo: EF 60-65%, no rwma, mod MR, mildly dil LA, nl RV fxn, PASP 21mmHg.  [name] Dysrhythmia    "mild arrythmia after taking Redux diet med years ago"no cardio fi  . Headache(784.0)   . History of stress test    a. 06/2016 MV: EF 67%, no ischemia/infarct.  . Hyperlipidemia   . Hypertension    echoJefm Bryant, wnl 2009? , had taken Redux for diet management  but now  off the market. pt, told that echo wnl.    . Moderate mitral regurgitation    a. 06/2016 Echo: EF 60-65%, mod MR.  . Morbid obesity ([place])   . Osteoarthritis   . Recurrent upper respiratory infection (URI)    treated /w OTC med.     Past  Surgical History:  Procedure Laterality Date  . ABDOMINAL HYSTERECTOMY     partial-uterus removed  . CHOLECYSTECTOMY     2011  . COCCYX REMOVAL     2004[facility]  . EYE SURGERY     states both lens replaced. Not sure if cataract surgery or not.  [name] HEEL SPUR SURGERY    . I&D EXTREMITY  08/27/2011   Procedure: IRRIGATION AND DEBRIDEMENT EXTREMITY;  Surgeon: [name], MD;  Location: [place];  Service: Orthopedics;  Laterality: Right;  . JOINT REPLACEMENT Right 2012   Right TKR  . KNEE ARTHROPLASTY  07/28/2011   Procedure: COMPUTER ASSISTED TOTAL KNEE ARTHROPLASTY;  Surgeon: [name], MD;  Location: [place];  Service: Orthopedics;  Laterality: Right;  right total knee arthroplasty, computer assisted  . KNEE ARTHROSCOPY     right knee  . TMJ ARTHROPLASTY     [facility]- [facility]    Family History  Problem Relation Age of Onset  . Heart disease Mother   . Hypertension Mother   . Hypertension Father   . Heart disease Sister 87       stent   . Heart attack Sister   . Hyperlipidemia Sister   . Hypertension Sister   . Heart attack Maternal Uncle 64  . Heart disease Maternal Uncle  CABG    Social History   Social History  . Marital status: Married    Spouse name: N/A  . Number of children: N/A  . Years of education: N/A   Occupational History  . Not on file.   Social History Main Topics  . Smoking status: Current Every Day Smoker    Packs/day: 0.25    Years: 50.00    Types: Cigarettes  . Smokeless tobacco: Never Used  . Alcohol use No  . Drug use: No  . Sexual activity: Not Currently   Other Topics Concern  . Not on file   Social History Narrative   Lives in [place] with husband.  Does not routinely exercise.     Current Outpatient Prescriptions:  .  albuterol (PROVENTIL HFA;VENTOLIN HFA) 108 (90 Base) MCG/ACT inhaler, Inhale 2 puffs into the lungs every 6 (six) hours as needed for wheezing or shortness of breath., Disp: 1 Inhaler, Rfl: 0 .   alprazolam (XANAX) 2 MG tablet, Take 1 tablet (2 mg total) by mouth 2 (two) times daily as needed for anxiety., Disp: 60 tablet, Rfl: 0 .  aspirin 81 MG EC tablet, Take 1 tablet (81 mg total) by mouth daily., Disp: 30 tablet, Rfl: 0 .  atenolol (TENORMIN) 100 MG tablet, TAKE 1 TABLET (100 MG TOTAL) BY MOUTH DAILY., Disp: 90 tablet, Rfl: 0 .  cyclobenzaprine (FLEXERIL) 5 MG tablet, TAKE ONE AND ONE-HALF TABLETS BY MOUTH EVERY NIGHT AT BEDTIME, Disp: 45 tablet, Rfl: 2 .  furosemide (LASIX) 40 MG tablet, Take 1 tablet (40 mg total) by mouth 2 (two) times daily. Take 60 mg in morning, 40 mg at night (Patient taking differently: Take 40 mg by mouth daily. Take 60 mg in morning, 40 mg at night), Disp: 135 tablet, Rfl: 0 .  hydrOXYzine (ATARAX/VISTARIL) 25 MG tablet, Take 1 tablet (25 mg total) by mouth every 8 (eight) hours as needed., Disp: 270 tablet, Rfl: 0 .  losartan (COZAAR) 50 MG tablet, TAKE 1 TABLET (50 MG TOTAL) BY MOUTH DAILY., Disp: 90 tablet, Rfl: 1 .  oxyCODONE (OXY IR/ROXICODONE) 5 MG immediate release tablet, Take 1 tablet (5 mg total) by mouth every 8 (eight) hours as needed for moderate pain., Disp: 90 tablet, Rfl: 0 .  potassium chloride SA (K-DUR,KLOR-CON) 20 MEQ tablet, Take 1 tablet (20 mEq total) by mouth 2 (two) times daily. (Patient taking differently: Take 20 mEq by mouth once. ), Disp: 60 tablet, Rfl: 2 .  traZODone (DESYREL) 150 MG tablet, Take 1 tablet by mouth at bedtime., Disp: , Rfl:   Allergies  Allergen Reactions  . Bacitracin-Neomycin-Polymyxin Other (See Comments)  . Codeine Other (See Comments)    Itching "she can take some forms of it" Itching "she can take some forms of it"  . Gabapentin Nausea Only  . Neosporin  [Neomycin-Bacitracin Zn-Polymyx]   . Pregabalin Other (See Comments)    stomach issues stomach issues     Review of Systems  Musculoskeletal: Positive for back pain.  Psychiatric/Behavioral: The patient is nervous/anxious and has insomnia.       Objective  Vitals:   02/04/17 1039  BP: 139/78  Pulse: 84  Resp: 17  Temp: 97.6 F (36.4 C)  TempSrc: Oral  SpO2: 93%  Weight: (!) 307 lb 11.2 oz (139.6 kg)  Height: 5\' 8"  (1.727 m)    Physical Exam  Constitutional: She is oriented to person, place, and time and well-developed, well-nourished, and in no distress.  Cardiovascular: Normal rate, regular rhythm  and normal heart sounds.   No murmur heard. Pulmonary/Chest: Effort normal and breath sounds normal. She has no wheezes.  Abdominal: Soft. Bowel sounds are normal. There is tenderness.  Musculoskeletal: She exhibits edema.       Left knee: She exhibits swelling. Tenderness found. Medial joint line tenderness noted.       Lumbar back: She exhibits tenderness, pain and spasm.  Generalized pitting edema (lymphedema) in both lower extremities  Neurological: She is alert and oriented to person, place, and time.  Psychiatric: Mood, memory, affect and judgment normal.  Nursing note and vitals reviewed.      Assessment & Plan  1. Chronic midline low back pain without sciatica Stable and responsive to opioid treatment, refills provided. - oxyCODONE (OXY IR/ROXICODONE) 5 MG immediate release tablet; Take 1 tablet (5 mg total) by mouth every 8 (eight) hours as needed for moderate pain.  Dispense: 90 tablet; Refill: 0  2. Anxiety Worsening of anxiety symptoms, we'll add low-dose SSRI, continue alprazolam as prescribed - alprazolam (XANAX) 2 MG tablet; Take 1 tablet (2 mg total) by mouth 2 (two) times daily as needed for anxiety.  Dispense: 60 tablet; Refill: 0 - citalopram (CELEXA) 10 MG tablet; Take 1 tablet (10 mg total) by mouth daily.  Dispense: 90 tablet; Refill: 0  3. Pain and swelling of left knee symptoms from degenerative arthritis of left knee, continue on opioid treatment which should help relieve the pain 4. NAFLD (nonalcoholic fatty liver disease) Based on patient's history, lab values and symptomatic  presentation, patient has been referred to liver specialist and has an appointment in 1-2 weeks, continue on furosemide for leg swelling   Mesa Janus Asad A[facility] 02/04/2017 10:52 AM

## 2017-02-08 ENCOUNTER — Other Ambulatory Visit: Payer: Self-pay | Admitting: Family Medicine

## 2017-02-08 DIAGNOSIS — G8929 Other chronic pain: Secondary | ICD-10-CM

## 2017-02-08 DIAGNOSIS — M545 Low back pain: Principal | ICD-10-CM

## 2017-02-09 ENCOUNTER — Ambulatory Visit: Payer: Medicare HMO | Admitting: Internal Medicine

## 2017-02-16 ENCOUNTER — Ambulatory Visit (INDEPENDENT_AMBULATORY_CARE_PROVIDER_SITE_OTHER): Payer: Medicare HMO | Admitting: Gastroenterology

## 2017-02-16 ENCOUNTER — Other Ambulatory Visit: Payer: Self-pay

## 2017-02-16 ENCOUNTER — Encounter: Payer: Self-pay | Admitting: Gastroenterology

## 2017-02-16 VITALS — BP 127/87 | HR 63 | Temp 97.8°F | Ht 68.0 in | Wt 305.2 lb

## 2017-02-16 DIAGNOSIS — D229 Melanocytic nevi, unspecified: Secondary | ICD-10-CM | POA: Insufficient documentation

## 2017-02-16 DIAGNOSIS — K7460 Unspecified cirrhosis of liver: Secondary | ICD-10-CM

## 2017-02-16 DIAGNOSIS — K7581 Nonalcoholic steatohepatitis (NASH): Secondary | ICD-10-CM

## 2017-02-16 DIAGNOSIS — M25473 Effusion, unspecified ankle: Secondary | ICD-10-CM | POA: Diagnosis not present

## 2017-02-16 DIAGNOSIS — Z23 Encounter for immunization: Secondary | ICD-10-CM | POA: Insufficient documentation

## 2017-02-16 DIAGNOSIS — Z833 Family history of diabetes mellitus: Secondary | ICD-10-CM | POA: Insufficient documentation

## 2017-02-16 DIAGNOSIS — Z1329 Encounter for screening for other suspected endocrine disorder: Secondary | ICD-10-CM | POA: Insufficient documentation

## 2017-02-16 DIAGNOSIS — R600 Localized edema: Secondary | ICD-10-CM | POA: Insufficient documentation

## 2017-02-16 DIAGNOSIS — M25476 Effusion, unspecified foot: Secondary | ICD-10-CM | POA: Diagnosis not present

## 2017-02-16 DIAGNOSIS — R252 Cramp and spasm: Secondary | ICD-10-CM | POA: Insufficient documentation

## 2017-02-16 DIAGNOSIS — M25474 Effusion, right foot: Secondary | ICD-10-CM

## 2017-02-16 DIAGNOSIS — R9431 Abnormal electrocardiogram [ECG] [EKG]: Secondary | ICD-10-CM | POA: Insufficient documentation

## 2017-02-16 MED ORDER — FUROSEMIDE 40 MG PO TABS: 80.0000 mg | ORAL_TABLET | Freq: Every day | ORAL | 2 refills | Status: DC

## 2017-02-16 MED ORDER — LACTULOSE 10 GM/15ML PO SOLN: 30.0000 g | Freq: Two times a day (BID) | ORAL | 0 refills | Status: AC

## 2017-02-16 MED ORDER — SPIRONOLACTONE 100 MG PO TABS: 150.0000 mg | ORAL_TABLET | Freq: Every day | ORAL | 2 refills | Status: DC

## 2017-02-16 NOTE — Progress Notes (Signed)
Cephas Darby, MD [address]  [place]  [place],  [address]  Main: [phone]  Fax: [phone]    Gastroenterology Consultation  Referring Provider:     Arnetha Courser, MD Primary Care Physician:  Roselee Nova, MD Primary Gastroenterologist:  Dr. Cephas Darby Reason for Consultation:    Cirrhosis        HPI:   Leah Shah is a 61 y.o. y/o female referred for consultation & management  by Dr. Manuella Ghazi, Talbert Forest, MD for recent diagnosis of cirrhosis of liver. She is morbidly obese, and has been gaining excessive weight secondary to progressive swelling of legs which started beginning of 2018 for which she was admitted to the hospital and diuresed with IV diuretics. She was discharged home on furosemide. She also has been experiencing a lot of mood changes and closely followed by Dr. Donneta Romberg. She is diagnosed with anxiety and depression and started her on antidepressants. She has not been able to get rid of fluid on current dose of diuretics. She consumes high sodium in her diet. She reports insomnia as she wakes up almost every hour during night since taking Lasix at bedtime. She is accompanied by her niece today. Her imaging studies suggest fatty liver and hepatomegaly from prior, and recently from ultrasound in July 2018 revealed nodular liver and portal hypertension. Therefore she is sent here for further management.  She otherwise denies fever, chills, nausea, vomiting, hematemesis, melena, rectal bleeding, shortness of breath, chest pain, headache, tingling or numbness. She reports easy bruising. She does report constipation which is relieved with stool softeners. She reports undergoing EGD several years ago but never had a colonoscopy.   First noted abnormality in LFT's in 06/2010 .  Alcohol use in the past, socially, quit 25yrs ago  Drug use none Over the counter herbal supplements Azo yeast supplements New medications n/a Abdominal pain lower abdomen  when constipated Tattoos yes, on left LE, lateral side of lower leg Military service none Prior blood transfusion none Incarceration none History of travel none Family history of liver disease none Recent change in weight gained from swelling of legs    Past Medical History:  Diagnosis Date  . Anxiety   . Arthritis    R knee, L knee - OA  . Chronic diastolic CHF (congestive heart failure) ([place])    a. 06/2016 Echo: EF 60-65%, no rwma, mod MR, mildly dil LA, nl RV fxn, PASP 60mmHg.  [name] Dysrhythmia    "mild arrythmia after taking Redux diet med years ago"no cardio fi  . Headache(784.0)   . History of stress test    a. 06/2016 MV: EF 67%, no ischemia/infarct.  . Hyperlipidemia   . Hypertension    echoJefm Bryant, wnl 2009? , had taken Redux for diet management  but now  off the market. pt, told that echo wnl.    . Moderate mitral regurgitation    a. 06/2016 Echo: EF 60-65%, mod MR.  . Morbid obesity ([place])   . Osteoarthritis   . Recurrent upper respiratory infection (URI)    treated /w OTC med.     Past Surgical History:  Procedure Laterality Date  . ABDOMINAL HYSTERECTOMY     partial-uterus removed  . CHOLECYSTECTOMY     2011  . COCCYX REMOVAL     [facility]  . EYE SURGERY     states both lens replaced. Not sure if cataract surgery or not.  [name] HEEL SPUR SURGERY    .  I&D EXTREMITY  08/27/2011   Procedure: IRRIGATION AND DEBRIDEMENT EXTREMITY;  Surgeon: [name], MD;  Location: [facility];  Service: Orthopedics;  Laterality: Right;  . JOINT REPLACEMENT Right 2012   Right TKR  . KNEE ARTHROPLASTY  07/28/2011   Procedure: COMPUTER ASSISTED TOTAL KNEE ARTHROPLASTY;  Surgeon: [name], MD;  Location: [facility];  Service: Orthopedics;  Laterality: Right;  right total knee arthroplasty, computer assisted  . KNEE ARTHROSCOPY     right knee  . TMJ ARTHROPLASTY     [facility]    Prior to Admission medications   Medication Sig Start Date End Date Taking? Authorizing  Provider  albuterol (PROVENTIL HFA;VENTOLIN HFA) 108 (90 Base) MCG/ACT inhaler Inhale 2 puffs into the lungs every 6 (six) hours as needed for wheezing or shortness of breath. 08/29/16   Loletha Grayer, MD  alprazolam Duanne Moron) 2 MG tablet Take 1 tablet (2 mg total) by mouth 2 (two) times daily as needed for anxiety. 02/04/17   Roselee Nova, MD  aspirin 81 MG EC tablet Take 1 tablet (81 mg total) by mouth daily. 08/30/16   Loletha Grayer, MD  atenolol (TENORMIN) 100 MG tablet TAKE 1 TABLET (100 MG TOTAL) BY MOUTH DAILY. 12/28/16   Roselee Nova, MD  citalopram (CELEXA) 10 MG tablet Take 1 tablet (10 mg total) by mouth daily. 02/04/17   Roselee Nova, MD  cyclobenzaprine (FLEXERIL) 5 MG tablet TAKE ONE AND ONE-HALF TABLETS BY MOUTH EVERY NIGHT AT BEDTIME 02/09/17   Lada, Satira Anis, MD  furosemide (LASIX) 40 MG tablet Take 1 tablet (40 mg total) by mouth 2 (two) times daily. Take 60 mg in morning, 40 mg at night Patient taking differently: Take 40 mg by mouth daily. Take 60 mg in morning, 40 mg at night 10/12/16   Roselee Nova, MD  hydrOXYzine (ATARAX/VISTARIL) 25 MG tablet Take 1 tablet (25 mg total) by mouth every 8 (eight) hours as needed. 01/07/17   Roselee Nova, MD  losartan (COZAAR) 50 MG tablet TAKE 1 TABLET (50 MG TOTAL) BY MOUTH DAILY. 11/09/16   Roselee Nova, MD  oxyCODONE (OXY IR/ROXICODONE) 5 MG immediate release tablet Take 1 tablet (5 mg total) by mouth every 8 (eight) hours as needed for moderate pain. 02/04/17   Roselee Nova, MD  potassium chloride SA (K-DUR,KLOR-CON) 20 MEQ tablet Take 1 tablet (20 mEq total) by mouth 2 (two) times daily. Patient taking differently: Take 20 mEq by mouth once.  11/09/16   Roselee Nova, MD  simvastatin (ZOCOR) 40 MG tablet TAKE 1 TABLET (40 MG TOTAL) BY MOUTH DAILY AT 6 PM. 12/28/16   [provider]    Family History  Problem Relation Age of Onset  . Heart disease Mother   . Hypertension Mother   . Hypertension Father     . Heart disease Sister 66       stent   . Heart attack Sister   . Hyperlipidemia Sister   . Hypertension Sister   . Heart attack Maternal Uncle 64  . Heart disease Maternal Uncle        CABG     Social History  Substance Use Topics  . Smoking status: Current Every Day Smoker    Packs/day: 0.25    Years: 50.00    Types: Cigarettes  . Smokeless tobacco: Never Used  . Alcohol use No    Allergies as of 02/16/2017 -  Review Complete 02/04/2017  Allergen Reaction Noted  . Bacitracin-neomycin-polymyxin Other (See Comments) 03/14/2015  . Codeine Other (See Comments) 05/12/2011  . Gabapentin Nausea Only 12/13/2014  . Neosporin  [neomycin-bacitracin zn-polymyx]  12/13/2014  . Pregabalin Other (See Comments) 12/13/2014    Review of Systems:    All systems reviewed and negative except where noted in HPI.   Physical Exam:  There were no vitals taken for this visit. No LMP recorded. Patient has had a hysterectomy. Psych:  Alert and cooperative. Normal mood and affect, obese. General:   Alert,  Well-developed, well-nourished, pleasant and cooperative in NAD Head:  Normocephalic and atraumatic. Eyes:  Sclera clear, positive icterus.   Conjunctiva pink. Ears:  Normal auditory acuity. Nose:  No deformity, discharge, or lesions Mouth:  No deformity or lesions,oropharynx pink & moist. Neck:  Supple; no masses or thyromegaly, short and thick, redundant skin. Lungs:  Respirations even and unlabored.  Clear throughout to auscultation.   No wheezes, crackles, or rhonchi. No acute distress. Heart:  Regular rate and rhythm; no murmurs, clicks, rubs, or gallops. Abdomen:  Normal bowel sounds.  No bruits.  Soft, non-tender and non-distended without masses, hepatosplenomegaly or hernias noted.  No guarding or rebound tenderness, redundant skin in lower abdomen.    Msk:  Symmetrical without gross deformities. Good, equal movement & strength bilaterally. Pulses:  Normal pulses noted. Extremities:   No clubbing, 3+ edema in bilateral lower extremities.  No cyanosis. Neurologic:  Alert and oriented x3;  grossly normal neurologically. 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 Psych:  Alert and cooperative. Normal mood and affect.  Imaging Studies: Ultrasound abdomen right upper quadrant 01/08/2017 IMPRESSION: Diffuse increased echotexture of the liver which can be fatty infiltration of liver. There is nodular contour of liver with hepatofugal flow in the portal plain suggesting changes of cirrhosis and portal hypertension.  Assessment and Plan:   ASA FATH  61 y.o. 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 However, further labs are necessary to look for secondary causes of chronic liver disease such as viral hepatitis, autoimmune liver disease, Primary Biliary cirrhosis, celiac disease, muscle disorders,Primary Sclerosing Cholangitis, Hemachromatosis, Wilson's disease, or A-1 antitrypsin deficiency.  Chronic liver disease/Cirrhosis : Decompensated with hyperbilirubeniemia, CPT score 9, class B, MELD-Na (INR not available) Etiology: NASH, However, I will rule out other causes of liver disease, check  iron & ferritin, Hepatitis total A Ab, Hepatitis B surface Ag, Hepatitis B surface Ab,& Hepatitis C Ab,HIV, ANA, AMA & anti-SMA, LKM ab, Immunoglobulins, Ceruloplasmin, A-1 antitrypsin, celiac serology. Also, check CBC, INR, LFTS, BMP Portal hypertension:  Volume overload/Ascites: Bilateral swelling of legs, Unable to evaluate for presence of ascites due to body habitus. Recommend  2gm sodium diet, increase furosemide to 80 mg in the morning and start spironolactone at 120 mg in the morning. She will stop cozaar and KCl. I will check BMP and magnesium in 2 weeks.  PSE: Mild, but no evidence of asterixis. 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 We will check PT/INR Varices: Unknown, EGD ordered HRS: Normal creatinine HCC screening: Up to date with ultrasound in July 2018, MRI none, CT none, AFP ordered  Health maintenance: Hepatitis A/B vaccine status: Serologies ordered Influenza vaccine: Unknown Pneumonia vaccine: Unknown Colonoscopy: Ordered Vitamin D  levels: Ordered Abstinence from ETOH: Encouraged to stay away from alcohol, quit more than 10 years ago Avoid NSAIDs 2D Echo: Performed on 01/24/5749, mild diastolic dysfunction, mild MR, left atrium moderately dilated, PA pressure elevated  Evaluate for liver transplant: Pending above workup   Follow up in 2 weeks  Cephas Darby, MD

## 2017-02-16 NOTE — Patient Instructions (Addendum)
1. Increase furosemide to 80mg  daily in the morning 2. Start spironolactone 120mg  daily in the morning along with furosemide  3. Start lactulose syrup for constipation 30mg  two times daily and decrease dose to once daily if it causes diarrhea 4. We will arrange for EGD and colonoscopy 5. Blood work today 6. Low sodium or no sodium   Please call our office to speak with my nurse Driscilla Grammes at [phone] during business hours from 8am to 4pm if you have any questions/concerns. During after hours, you will be redirected to on call GI physician. For any emergency please call 911 or go the nearest emergency room.    Cephas Darby, MD [address]  [place]  [place], [place] [address]  Main: [phone]  Fax: [phone]

## 2017-02-24 ENCOUNTER — Telehealth: Payer: Self-pay

## 2017-02-24 NOTE — Telephone Encounter (Signed)
Pt left voice message stating that she was experiencing blisters on her legs. I returned her phone call however no one answered.  Left message for her to  Return my call and will try again tomorrow to contact her.

## 2017-02-26 ENCOUNTER — Other Ambulatory Visit
Admission: RE | Admit: 2017-02-26 | Discharge: 2017-02-26 | Disposition: A | Discharge status: Home / Self Care | Payer: Medicare HMO | Source: Ambulatory Visit | Attending: Gastroenterology | Admitting: Gastroenterology

## 2017-02-26 ENCOUNTER — Other Ambulatory Visit: Payer: Self-pay | Admitting: Gastroenterology

## 2017-02-26 ENCOUNTER — Other Ambulatory Visit: Payer: Self-pay

## 2017-02-26 DIAGNOSIS — K7460 Unspecified cirrhosis of liver: Secondary | ICD-10-CM | POA: Diagnosis not present

## 2017-02-26 DIAGNOSIS — K7469 Other cirrhosis of liver: Secondary | ICD-10-CM

## 2017-02-26 DIAGNOSIS — K7581 Nonalcoholic steatohepatitis (NASH): Secondary | ICD-10-CM | POA: Insufficient documentation

## 2017-02-26 LAB — FERRITIN
FERRITIN: 134 ng/mL (ref 11–307)
Ferritin: 124 ng/mL (ref 11–307)

## 2017-02-26 LAB — CBC
HCT: 33.9 % — ABNORMAL LOW (ref 35.0–47.0)
Hemoglobin: 11.9 g/dL — ABNORMAL LOW (ref 12.0–16.0)
MCH: 41.3 pg — ABNORMAL HIGH (ref 26.0–34.0)
MCHC: 35 g/dL (ref 32.0–36.0)
MCV: 118 fL — ABNORMAL HIGH (ref 80.0–100.0)
PLATELETS: 66 10*3/uL — AB (ref 150–440)
RBC: 2.88 MIL/uL — ABNORMAL LOW (ref 3.80–5.20)
RDW: 19 % — AB (ref 11.5–14.5)
WBC: 5.6 10*3/uL (ref 3.6–11.0)

## 2017-02-26 LAB — BASIC METABOLIC PANEL
ANION GAP: 10 (ref 5–15)
BUN: 8 mg/dL (ref 6–20)
CALCIUM: 8.5 mg/dL — AB (ref 8.9–10.3)
CHLORIDE: 97 mmol/L — AB (ref 101–111)
CO2: 29 mmol/L (ref 22–32)
Creatinine, Ser: 0.66 mg/dL (ref 0.44–1.00)
GFR calc non Af Amer: >60 mL/min (ref >60)
Glucose, Bld: 131 mg/dL — ABNORMAL HIGH (ref 65–99)
Potassium: 2.7 mmol/L — CL (ref 3.5–5.1)
Sodium: 136 mmol/L (ref 135–145)

## 2017-02-26 LAB — PROTIME-INR
INR: 1.82
PROTHROMBIN TIME: 21.3 s — AB (ref 11.4–15.2)

## 2017-02-26 LAB — IRON AND TIBC
Iron: 212 ug/dL — ABNORMAL HIGH (ref 28–170)
SATURATION RATIOS: 88 % — AB (ref 10.4–31.8)
TIBC: 241 ug/dL — ABNORMAL LOW (ref 250–450)
UIBC: 29 ug/dL

## 2017-02-26 LAB — HEPATIC FUNCTION PANEL
ALBUMIN: 3.1 g/dL — AB (ref 3.5–5.0)
ALT: 22 U/L (ref 14–54)
AST: 43 U/L — AB (ref 15–41)
Alkaline Phosphatase: 143 U/L — ABNORMAL HIGH (ref 38–126)
BILIRUBIN DIRECT: 1.8 mg/dL — AB (ref 0.1–0.5)
Indirect Bilirubin: 6.3 mg/dL — ABNORMAL HIGH (ref 0.3–0.9)
Total Bilirubin: 8.1 mg/dL — ABNORMAL HIGH (ref 0.3–1.2)
Total Protein: 7.2 g/dL (ref 6.5–8.1)

## 2017-02-26 LAB — MAGNESIUM: MAGNESIUM: 1.3 mg/dL — AB (ref 1.7–2.4)

## 2017-02-26 NOTE — Progress Notes (Signed)
Called patient about labs done today. Potassium 2.7 and magnesium 1.3. She is feeling significantly better with regards to swelling of legs, abdominal distension. She is worried about swelling in her feet. Also, she denies day time somnolence, sleeping good during nights. She had cut back on lactulose as she was having liquid stools. Also, her indirect bilirubin is elevated. Cr is actually improving. She denied abdominal pain, f/c/n/v/melena/hematemesis  My recommendations - Take oral potassium 61meq, she actually took it when I was on phone with her and take 16meq daily - Take magnesium 400mg  twice daily - Increase spironolactone to 200mg  daily - Continue lasix 80mg  daily - Decrease lactulose to once daily - Recheck labs Tuesday, next week - She has appt with me on Wednesday next week  I called in prescriptions for more potassium and magnesium at [facility] listed on her chart.  She verbally called back my recommendations and expressed understanding of the plan    Cephas Darby, MD [address]  [place]  [place], [place] [address]  Main: [phone]  Fax: [phone] Pager: [phone]

## 2017-02-27 LAB — ANA COMPREHENSIVE PANEL
Anti JO-1: <0.2 AI (ref 0.0–0.9)
Chromatin Ab SerPl-aCnc: <0.2 AI (ref 0.0–0.9)
ds DNA Ab: 3 IU/mL (ref 0–9)

## 2017-02-27 LAB — MISC LABCORP TEST (SEND OUT): Labcorp test code: 1768

## 2017-02-27 LAB — HEPATITIS B SURFACE ANTIBODY, QUANTITATIVE: Hepatitis B-Post: <3.1 m[IU]/mL — ABNORMAL LOW (ref >9.9)

## 2017-02-27 LAB — HEPATITIS A ANTIBODY, TOTAL: Hep A Total Ab: POSITIVE — AB

## 2017-02-27 LAB — ANTI-MICROSOMAL ANTIBODY LIVER / KIDNEY: LKM1 Ab: 2.6 Units (ref 0.0–20.0)

## 2017-02-27 LAB — VITAMIN D 25 HYDROXY (VIT D DEFICIENCY, FRACTURES): Vit D, 25-Hydroxy: 6.1 ng/mL — ABNORMAL LOW (ref 30.0–100.0)

## 2017-02-27 LAB — HEPATITIS B SURFACE ANTIGEN: HEP B S AG: NEGATIVE

## 2017-02-27 LAB — CERULOPLASMIN: Ceruloplasmin: 16.8 mg/dL — ABNORMAL LOW (ref 19.0–39.0)

## 2017-02-28 LAB — TISSUE TRANSGLUTAMINASE, IGG: Tissue Transglut Ab: <2 U/mL (ref 0–5)

## 2017-02-28 LAB — TISSUE TRANSGLUTAMINASE, IGA: TISSUE TRANSGLUTAMINASE AB, IGA: 2 U/mL (ref 0–3)

## 2017-03-01 LAB — ANTI-SMOOTH MUSCLE ANTIBODY, IGG: F-ACTIN AB IGG: 19 U (ref 0–19)

## 2017-03-01 LAB — MISC LABCORP TEST (SEND OUT): LABCORP TEST CODE: 141300

## 2017-03-01 LAB — MITOCHONDRIAL ANTIBODIES: Mitochondrial M2 Ab, IgG: 10.2 Units (ref 0.0–20.0)

## 2017-03-02 ENCOUNTER — Other Ambulatory Visit
Admission: RE | Admit: 2017-03-02 | Discharge: 2017-03-02 | Disposition: A | Discharge status: Home / Self Care | Payer: Medicare HMO | Source: Ambulatory Visit | Attending: Gastroenterology | Admitting: Gastroenterology

## 2017-03-02 DIAGNOSIS — K7469 Other cirrhosis of liver: Secondary | ICD-10-CM | POA: Diagnosis not present

## 2017-03-02 LAB — BASIC METABOLIC PANEL
ANION GAP: 6 (ref 5–15)
BUN: 8 mg/dL (ref 6–20)
CHLORIDE: 101 mmol/L (ref 101–111)
CO2: 29 mmol/L (ref 22–32)
Calcium: 8.9 mg/dL (ref 8.9–10.3)
Creatinine, Ser: 0.78 mg/dL (ref 0.44–1.00)
GFR calc Af Amer: >60 mL/min (ref >60)
GLUCOSE: 92 mg/dL (ref 65–99)
POTASSIUM: 3.9 mmol/L (ref 3.5–5.1)
Sodium: 136 mmol/L (ref 135–145)

## 2017-03-02 LAB — HEPATIC FUNCTION PANEL
ALK PHOS: 195 U/L — AB (ref 38–126)
ALT: 26 U/L (ref 14–54)
AST: 48 U/L — AB (ref 15–41)
Albumin: 3.2 g/dL — ABNORMAL LOW (ref 3.5–5.0)
BILIRUBIN INDIRECT: 4.1 mg/dL — AB (ref 0.3–0.9)
Bilirubin, Direct: 1.7 mg/dL — ABNORMAL HIGH (ref 0.1–0.5)
TOTAL PROTEIN: 7.5 g/dL (ref 6.5–8.1)
Total Bilirubin: 5.8 mg/dL — ABNORMAL HIGH (ref 0.3–1.2)

## 2017-03-02 LAB — ALPHA-1 ANTITRYPSIN PHENOTYPE: A1 ANTITRYPSIN SER: 143 mg/dL (ref 90–200)

## 2017-03-02 LAB — MAGNESIUM: Magnesium: 1.7 mg/dL (ref 1.7–2.4)

## 2017-03-03 ENCOUNTER — Encounter: Payer: Self-pay | Admitting: Gastroenterology

## 2017-03-03 ENCOUNTER — Ambulatory Visit (INDEPENDENT_AMBULATORY_CARE_PROVIDER_SITE_OTHER): Payer: Medicare HMO | Admitting: Gastroenterology

## 2017-03-03 VITALS — BP 122/76 | Temp 98.1°F | Ht 68.0 in | Wt 277.6 lb

## 2017-03-03 DIAGNOSIS — K7460 Unspecified cirrhosis of liver: Secondary | ICD-10-CM | POA: Diagnosis not present

## 2017-03-03 DIAGNOSIS — R188 Other ascites: Principal | ICD-10-CM

## 2017-03-03 DIAGNOSIS — K760 Fatty (change of) liver, not elsewhere classified: Secondary | ICD-10-CM

## 2017-03-03 MED ORDER — VITAMIN D (ERGOCALCIFEROL) 1.25 MG (50000 UNIT) PO CAPS: 50000.0000 [IU] | ORAL_CAPSULE | ORAL | 0 refills | Status: AC

## 2017-03-03 NOTE — Progress Notes (Signed)
Cephas Darby, MD [address]  [place]  [place],  [address]  Main: [phone]  Fax: [phone]    Gastroenterology Consultation  Referring Provider:     Roselee Nova, MD Primary Care Physician:  Roselee Nova, MD Primary Gastroenterologist:  Dr. Cephas Darby Reason for Consultation:    Cirrhosis        HPI:   Leah Shah is a 61 y.o. y/o female referred for consultation & management  by Dr. Manuella Ghazi, Talbert [place], MD for recent diagnosis of cirrhosis of liver. She is morbidly obese, and has been gaining excessive weight secondary to progressive swelling of legs which started beginning of 2018 for which she was admitted to the hospital and diuresed with IV diuretics. She was discharged home on furosemide. She also has been experiencing a lot of mood changes and closely followed by Dr. Donneta Romberg. She is diagnosed with anxiety and depression and started her on antidepressants. She has not been able to get rid of fluid on current dose of diuretics. She consumes high sodium in her diet. She reports insomnia as she wakes up almost every hour during night since taking Lasix at bedtime. She is accompanied by her niece today. Her imaging studies suggest fatty liver and hepatomegaly from prior, and recently from ultrasound in July 2018 revealed nodular liver and portal hypertension. Therefore she is sent here for further management.  She otherwise denies fever, chills, nausea, vomiting, hematemesis, melena, rectal bleeding, shortness of breath, chest pain, headache, tingling or numbness. She reports easy bruising. She does report constipation which is relieved with stool softeners. She reports undergoing EGD several years ago but never had a colonoscopy.   First noted abnormality in LFT's in 06/2010 .  Alcohol use in the past, socially, quit 69yrs ago  Drug use none Over the counter herbal supplements Azo yeast supplements New medications n/a Abdominal pain lower abdomen  when constipated Tattoos yes, on left LE, lateral side of lower leg Military service none Prior blood transfusion none Incarceration none History of travel none Family history of liver disease none Recent change in weight gained from swelling of legs  Follow up visit 03/03/2017: She reports significant improvement in her abdominal distention, swelling of legs, her weight has decreased from 305lbs to 277.6lbs in 2 weeks. Her potassium and magnesium were low over the weekend, and a repeated them. Repeat labs came back normal. She is currently on furosemide 80 mg and spironolactone 200 mg daily she continues to have large urine output, up to 10 times a day. She is able to sleep better at night, less daytime somnolence. Her labs also revealed that she has elevated total bilirubin and predominantly indirect bilirubin. She denies abdominal pain, fever, chills, dark urine. She is adherent to low-salt diet. She is accompanied by her sister and daughter-in-law today. The workup for secondary liver disease showed that her ceruloplasmin levels are mildly low. Her AFP levels are elevated. She continues to smoke cigarettes.  Past Medical History:  Diagnosis Date  . Anxiety   . Arthritis    R knee, L knee - OA  . Chronic diastolic CHF (congestive heart failure) ([place])    a. 06/2016 Echo: EF 60-65%, no rwma, mod MR, mildly dil LA, nl RV fxn, PASP 85mmHg.  [name] Dysrhythmia    "mild arrythmia after taking Redux diet med years ago"no cardio fi  . Headache(784.0)   . History of stress test    a. 06/2016 MV:  EF 67%, no ischemia/infarct.  . Hyperlipidemia   . Hypertension    echoJefm Bryant, wnl 2009? , had taken Redux for diet management  but now  off the market. pt, told that echo wnl.    . Moderate mitral regurgitation    a. 06/2016 Echo: EF 60-65%, mod MR.  . Morbid obesity ([place])   . Osteoarthritis   . Recurrent upper respiratory infection (URI)    treated /w OTC med.     Past Surgical History:    Procedure Laterality Date  . ABDOMINAL HYSTERECTOMY     partial-uterus removed  . CHOLECYSTECTOMY     2011  . COCCYX REMOVAL     [facility]  . EYE SURGERY     states both lens replaced. Not sure if cataract surgery or not.  [name] HEEL SPUR SURGERY    . I&D EXTREMITY  08/27/2011   Procedure: IRRIGATION AND DEBRIDEMENT EXTREMITY;  Surgeon: [name], MD;  Location: [facility];  Service: Orthopedics;  Laterality: Right;  . JOINT REPLACEMENT Right 2012   Right TKR  . KNEE ARTHROPLASTY  07/28/2011   Procedure: COMPUTER ASSISTED TOTAL KNEE ARTHROPLASTY;  Surgeon: [name], MD;  Location: [place];  Service: Orthopedics;  Laterality: Right;  right total knee arthroplasty, computer assisted  . KNEE ARTHROSCOPY     right knee  . TMJ ARTHROPLASTY     [place]    Prior to Admission medications   Medication Sig Start Date End Date Taking? Authorizing Provider  albuterol (PROVENTIL HFA;VENTOLIN HFA) 108 (90 Base) MCG/ACT inhaler Inhale 2 puffs into the lungs every 6 (six) hours as needed for wheezing or shortness of breath. 08/29/16   Loletha Grayer, MD  alprazolam Duanne Moron) 2 MG tablet Take 1 tablet (2 mg total) by mouth 2 (two) times daily as needed for anxiety. 02/04/17   Roselee Nova, MD  aspirin 81 MG EC tablet Take 1 tablet (81 mg total) by mouth daily. 08/30/16   Loletha Grayer, MD  atenolol (TENORMIN) 100 MG tablet TAKE 1 TABLET (100 MG TOTAL) BY MOUTH DAILY. 12/28/16   Roselee Nova, MD  citalopram (CELEXA) 10 MG tablet Take 1 tablet (10 mg total) by mouth daily. 02/04/17   Roselee Nova, MD  cyclobenzaprine (FLEXERIL) 5 MG tablet TAKE ONE AND ONE-HALF TABLETS BY MOUTH EVERY NIGHT AT BEDTIME 02/09/17   Lada, Satira Anis, MD  furosemide (LASIX) 40 MG tablet Take 1 tablet (40 mg total) by mouth 2 (two) times daily. Take 60 mg in morning, 40 mg at night Patient taking differently: Take 40 mg by mouth daily. Take 60 mg in morning, 40 mg at night 10/12/16   Roselee Nova, MD   hydrOXYzine (ATARAX/VISTARIL) 25 MG tablet Take 1 tablet (25 mg total) by mouth every 8 (eight) hours as needed. 01/07/17   Roselee Nova, MD  losartan (COZAAR) 50 MG tablet TAKE 1 TABLET (50 MG TOTAL) BY MOUTH DAILY. 11/09/16   Roselee Nova, MD  oxyCODONE (OXY IR/ROXICODONE) 5 MG immediate release tablet Take 1 tablet (5 mg total) by mouth every 8 (eight) hours as needed for moderate pain. 02/04/17   Roselee Nova, MD  potassium chloride SA (K-DUR,KLOR-CON) 20 MEQ tablet Take 1 tablet (20 mEq total) by mouth 2 (two) times daily. Patient taking differently: Take 20 mEq by mouth once.  11/09/16   Roselee Nova, MD  simvastatin (ZOCOR) 40 MG tablet TAKE 1 TABLET (  40 MG TOTAL) BY MOUTH DAILY AT 6 PM. 12/28/16   [provider]    Family History  Problem Relation Age of Onset  . Heart disease Mother   . Hypertension Mother   . Hypertension Father   . Heart disease Sister 75       stent   . Heart attack Sister   . Hyperlipidemia Sister   . Hypertension Sister   . Heart attack Maternal Uncle 64  . Heart disease Maternal Uncle        CABG     Social History  Substance Use Topics  . Smoking status: Current Every Day Smoker    Packs/day: 0.25    Years: 50.00    Types: Cigarettes  . Smokeless tobacco: Never Used  . Alcohol use No    Allergies as of 03/03/2017 - Review Complete 03/03/2017  Allergen Reaction Noted  . Bacitracin-neomycin-polymyxin Other (See Comments) 03/14/2015  . Codeine Other (See Comments) 05/12/2011  . Gabapentin Nausea Only 12/13/2014  . Neosporin  [neomycin-bacitracin zn-polymyx]  12/13/2014  . Pregabalin Other (See Comments) 12/13/2014    Review of Systems:    All systems reviewed and negative except where noted in HPI.   Physical Exam:  BP 122/76   Temp 98.1 F (36.7 C) (Oral)   Ht 5\' 8"  (1.727 m)   Wt 277 lb 9.6 oz (125.9 kg)   BMI 42.21 kg/m  No LMP recorded. Patient has had a hysterectomy. Psych:  Alert and cooperative. Normal  mood and affect, obese. General:   Alert,  Well-developed, well-nourished, pleasant and cooperative in NAD Head:  Normocephalic and atraumatic. Eyes:  Sclera clear, positive icterus.   Conjunctiva pink. Ears:  Normal auditory acuity. Nose:  No deformity, discharge, or lesions Mouth:  No deformity or lesions,oropharynx pink & moist. Neck:  Supple; no masses or thyromegaly, short and thick, redundant skin. Lungs:  Respirations even and unlabored.  Clear throughout to auscultation.   No wheezes, crackles, or rhonchi. No acute distress. Heart:  Regular rate and rhythm; no murmurs, clicks, rubs, or gallops. Abdomen:  Normal bowel sounds.  No bruits.  Soft, non-tender and non-distended without masses, hepatosplenomegaly or hernias noted.  No guarding or rebound tenderness, redundant skin in lower abdomen.    Msk:  Symmetrical without gross deformities. Good, equal movement & strength bilaterally. Pulses:  Normal pulses noted. Extremities:  No clubbing, 1+ edema in bilateral lower extremities, has 3+ pedal edema.  No cyanosis. Neurologic:  Alert and oriented x3;  grossly normal neurologically. Skin:  Intact without significant lesions, large purpurea in upper extremities Psych:  Alert and cooperative. Normal mood and affect.  Imaging Studies: Ultrasound abdomen right upper quadrant 01/08/2017 IMPRESSION: Diffuse increased echotexture of the liver which can be fatty infiltration of liver. There is nodular contour of liver with hepatofugal flow in the portal plain suggesting changes of cirrhosis and portal hypertension.  Assessment and Plan:   ERETRIA MANTERNACH  61 y.o. 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   Chronic liver disease/Cirrhosis : Decompensated with Volume overload, hyperbilirubeniemia, CPT score 9, class B, MELD-Na 20   Etiology: NASH, Secondary causes of liver disease revealed normal  iron & ferritin, Hepatitis total A Ab positive,  Hepatitis B surface Ag negative, Hepatitis B surface Ab mildly positive,& Hepatitis C Ab negative,HIV negative, ANA negative, AMA & anti-SMA negative, LKM ab negative, Immunoglobulins normal, Ceruloplasmin levels are mildly low, A-1 antitrypsin normal, celiac serology negative. I will order 24-hour  urine copper levels  Portal hypertension:  Volume overload/Ascites: Significant improvement in bilateral swelling of legs. Recommend to continue 2gm sodium diet. Because, she lost about 25 pounds in last 2 weeks from aggressive diuresis, I will decrease furosemide to 60 mg in the morning and spironolactone at 150 mg in the morning. I told her to further decrease furosemide to 40 mg in the morning and spironolactone 200 mg in the morning if she continues to have frequent urination of more than 6 times a day. She will continue oral potassium 40 mEq daily and magnesium daily. I will check BMP and magnesium on Friday this week.   PSE: Mild, but no evidence of asterixis. Continue lactulose with a goal to have 1 to 2 formed stools daily Anemia/Thrombocytopenia/Coagulopathy: Has macrocytic anemia secondary to chronic liver disease as well as thrombocytopenia from portal hypertension. Mild coagulopathy. Varices: Unknown, EGD scheduled  HRS: Normal creatinine HCC screening: Up to date with ultrasound in July 2018, AFP elevated to 13.3. I have ordered MRI liver mass protocol to evaluate for HCC. CT none   Health maintenance: Hepatitis A/B vaccine status: Immune to hepatitis A, recommend hepatitis B vaccine Influenza vaccine: Received in 05/2016 Pneumonia vaccine: Received PPSV-23 in 08/2016 Colonoscopy: Ordered Vitamin D levels: Very low at 7, replaced with 50,000 units weekly for 3 months and recheck levels in 3 months Abstinence from ETOH: Encouraged to stay away from alcohol, quit more than 10 years ago Avoid NSAIDs 2D Echo: Performed on 38/32/9191, mild diastolic dysfunction, mild MR, left atrium moderately  dilated, PA pressure elevated Smoking: Encouraged to quit smoking  Evaluate for liver transplant: Pending above workup   Follow up in 4 weeks  Cephas Darby, MD

## 2017-03-03 NOTE — Patient Instructions (Addendum)
1. Decrease dose of lasix to 60mg  and spiranolactone 150mg  daily 2. Labs Friday to check CMP, magnesium 3. MRI liver mass protocol 4. Vit D 50,000U weekly 5. EGD for varceal screening 6. 24 hr urine test to measure copper  Please call our office to speak with my nurse Driscilla Grammes at [phone] during business hours from 8am to 4pm if you have any questions/concerns. During after hours, you will be redirected to on call GI physician. For any emergency please call 911 or go the nearest emergency room.    Cephas Darby, MD [address]  [place]  [place], [place] [address]  Main: [phone]  Fax: [phone]

## 2017-03-04 ENCOUNTER — Encounter: Payer: Self-pay | Admitting: Family Medicine

## 2017-03-04 ENCOUNTER — Ambulatory Visit (INDEPENDENT_AMBULATORY_CARE_PROVIDER_SITE_OTHER): Payer: Medicare HMO | Admitting: Family Medicine

## 2017-03-04 VITALS — BP 127/80 | HR 77 | Temp 97.9°F | Resp 16 | Ht 68.0 in | Wt 275.1 lb

## 2017-03-04 DIAGNOSIS — F419 Anxiety disorder, unspecified: Secondary | ICD-10-CM | POA: Diagnosis not present

## 2017-03-04 DIAGNOSIS — G8929 Other chronic pain: Secondary | ICD-10-CM

## 2017-03-04 DIAGNOSIS — Z23 Encounter for immunization: Secondary | ICD-10-CM | POA: Diagnosis not present

## 2017-03-04 DIAGNOSIS — M545 Low back pain: Secondary | ICD-10-CM | POA: Diagnosis not present

## 2017-03-04 MED ORDER — OXYCODONE HCL 5 MG PO TABS: 5.0000 mg | ORAL_TABLET | Freq: Three times a day (TID) | ORAL | 0 refills | Status: DC | PRN

## 2017-03-04 MED ORDER — ALPRAZOLAM 2 MG PO TABS: 2.0000 mg | ORAL_TABLET | Freq: Two times a day (BID) | ORAL | 0 refills | Status: DC | PRN

## 2017-03-04 MED ORDER — CYCLOBENZAPRINE HCL 5 MG PO TABS: ORAL_TABLET | ORAL | 0 refills | Status: AC

## 2017-03-04 NOTE — Progress Notes (Signed)
Name: Leah Shah   MRN: [id number]    DOB: [birth year]   Date:03/04/2017       Progress Note  Subjective  Chief Complaint  Chief Complaint  Patient presents with  . Follow-up    1 mo  . Medication Refill    Back Pain  This is a chronic problem. The problem occurs daily. The problem is unchanged. The pain is present in the lumbar spine and sacro-iliac. The pain is at a severity of 5/10. The symptoms are aggravated by bending, position and sitting. Pertinent negatives include no bladder incontinence, bowel incontinence or leg pain (leg cramps at night). She has tried analgesics and muscle relaxant for the symptoms.  Anxiety  Presents for follow-up visit. The problem has been unchanged. Symptoms include depressed mood, excessive worry, insomnia, nervous/anxious behavior and panic. Patient reports no hyperventilation or obsessions. Symptoms occur most days. The severity of symptoms is moderate and causing significant distress.   Her past medical history is significant for anxiety/panic attacks. Past treatments include benzodiazephines. Compliance with prior treatments has been good.      Past Medical History:  Diagnosis Date  . Anxiety   . Arthritis    R knee, L knee - OA  . Chronic diastolic CHF (congestive heart failure) ([place])    a. 06/2016 Echo: EF 60-65%, no rwma, mod MR, mildly dil LA, nl RV fxn, PASP 58mmHg.  [name] Dysrhythmia    "mild arrythmia after taking Redux diet med years ago"no cardio fi  . Headache(784.0)   . History of stress test    a. 06/2016 MV: EF 67%, no ischemia/infarct.  . Hyperlipidemia   . Hypertension    echoJefm Bryant, wnl 2009? , had taken Redux for diet management  but now  off the market. pt, told that echo wnl.    . Moderate mitral regurgitation    a. 06/2016 Echo: EF 60-65%, mod MR.  . Morbid obesity ([place])   . Osteoarthritis   . Recurrent upper respiratory infection (URI)    treated /w OTC med.     Past Surgical History:  Procedure Laterality  Date  . ABDOMINAL HYSTERECTOMY     partial-uterus removed  . CHOLECYSTECTOMY     2011  . COCCYX REMOVAL     [facility]  . EYE SURGERY     states both lens replaced. Not sure if cataract surgery or not.  [name] HEEL SPUR SURGERY    . I&D EXTREMITY  08/27/2011   Procedure: IRRIGATION AND DEBRIDEMENT EXTREMITY;  Surgeon: [name], MD;  Location: [place] [facility];  Service: Orthopedics;  Laterality: Right;  . JOINT REPLACEMENT Right 2012   Right TKR  . KNEE ARTHROPLASTY  07/28/2011   Procedure: COMPUTER ASSISTED TOTAL KNEE ARTHROPLASTY;  Surgeon: [name], MD;  Location: [place];  Service: Orthopedics;  Laterality: Right;  right total knee arthroplasty, computer assisted  . KNEE ARTHROSCOPY     right knee  . TMJ ARTHROPLASTY     [facility]- [facility]    Family History  Problem Relation Age of Onset  . Heart disease Mother   . Hypertension Mother   . Hypertension Father   . Heart disease Sister 48       stent   . Heart attack Sister   . Hyperlipidemia Sister   . Hypertension Sister   . Heart attack Maternal Uncle 64  . Heart disease Maternal Uncle        CABG    Social History   Social History  .  Marital status: Married    Spouse name: N/A  . Number of children: N/A  . Years of education: N/A   Occupational History  . Not on file.   Social History Main Topics  . Smoking status: Current Every Day Smoker    Packs/day: 0.25    Years: 50.00    Types: Cigarettes  . Smokeless tobacco: Never Used  . Alcohol use No  . Drug use: No  . Sexual activity: Not Currently   Other Topics Concern  . Not on file   Social History Narrative   Lives in [place] with husband.  Does not routinely exercise.     Current Outpatient Prescriptions:  .  albuterol (PROVENTIL HFA;VENTOLIN HFA) 108 (90 Base) MCG/ACT inhaler, Inhale 2 puffs into the lungs every 6 (six) hours as needed for wheezing or shortness of breath., Disp: 1 Inhaler, Rfl: 0 .  alprazolam (XANAX) 2 MG tablet, Take 1 tablet  (2 mg total) by mouth 2 (two) times daily as needed for anxiety., Disp: 60 tablet, Rfl: 0 .  aspirin 81 MG EC tablet, Take 1 tablet (81 mg total) by mouth daily., Disp: 30 tablet, Rfl: 0 .  atenolol (TENORMIN) 100 MG tablet, TAKE 1 TABLET (100 MG TOTAL) BY MOUTH DAILY., Disp: 90 tablet, Rfl: 0 .  citalopram (CELEXA) 10 MG tablet, Take 1 tablet (10 mg total) by mouth daily., Disp: 90 tablet, Rfl: 0 .  cyclobenzaprine (FLEXERIL) 5 MG tablet, TAKE ONE AND ONE-HALF TABLETS BY MOUTH EVERY NIGHT AT BEDTIME, Disp: 12 tablet, Rfl: 0 .  furosemide (LASIX) 40 MG tablet, Take 2 tablets (80 mg total) by mouth daily. In the morning, Disp: 60 tablet, Rfl: 2 .  hydrOXYzine (ATARAX/VISTARIL) 25 MG tablet, Take 1 tablet (25 mg total) by mouth every 8 (eight) hours as needed., Disp: 270 tablet, Rfl: 0 .  lactulose (CHRONULAC) 10 GM/15ML solution, Take 45 mLs (30 g total) by mouth 2 (two) times daily., Disp: 2700 mL, Rfl: 0 .  oxyCODONE (OXY IR/ROXICODONE) 5 MG immediate release tablet, Take 1 tablet (5 mg total) by mouth every 8 (eight) hours as needed for moderate pain., Disp: 90 tablet, Rfl: 0 .  potassium chloride SA (K-DUR,KLOR-CON) 20 MEQ tablet, , Disp: , Rfl:  .  spironolactone (ALDACTONE) 100 MG tablet, Take 1.5 tablets (150 mg total) by mouth daily., Disp: 45 tablet, Rfl: 2 .  Vitamin D, Ergocalciferol, (DRISDOL) 50000 units CAPS capsule, Take 1 capsule (50,000 Units total) by mouth every 7 (seven) days., Disp: 16 capsule, Rfl: 0  Allergies  Allergen Reactions  . Bacitracin-Neomycin-Polymyxin Other (See Comments)  . Codeine Other (See Comments)    Itching "she can take some forms of it" Itching "she can take some forms of it"  . Gabapentin Nausea Only  . Neosporin  [Neomycin-Bacitracin Zn-Polymyx]   . Pregabalin Other (See Comments)    stomach issues stomach issues     Review of Systems  Gastrointestinal: Negative for bowel incontinence.  Genitourinary: Negative for bladder incontinence.    Musculoskeletal: Positive for back pain.  Psychiatric/Behavioral: The patient is nervous/anxious and has insomnia.       Objective  Vitals:   03/04/17 1414  BP: 127/80  Pulse: 77  Resp: 16  Temp: 97.9 F (36.6 C)  TempSrc: Oral  SpO2: 95%  Weight: 275 lb 1.6 oz (124.8 kg)  Height: 5\' 8"  (1.727 m)    Physical Exam  Constitutional: She is well-developed, well-nourished, and in no distress.  HENT:  Head: Normocephalic and atraumatic.  Cardiovascular: Normal rate, regular rhythm and normal heart sounds.   No murmur heard. Pulmonary/Chest: Effort normal and breath sounds normal. She has no wheezes.  Musculoskeletal:       Lumbar back: She exhibits tenderness and pain.       Back:  Psychiatric: Mood, memory, affect and judgment normal.  Nursing note and vitals reviewed.     Assessment & Plan  1. Anxiety Stable and responsive to alprazolam taken twice daily as needed, refills provided - alprazolam (XANAX) 2 MG tablet; Take 1 tablet (2 mg total) by mouth 2 (two) times daily as needed for anxiety.  Dispense: 60 tablet; Refill: 0  2. Chronic midline low back pain without sciatica Pain and associated muscle spasm are responsive to cyclobenzaprine and oxycodone taken every 8 hours as needed, patient compliant with controlled substances agreement and understands the dependence potential, side effects and drug interactions of opioids, refills provided and follow-up in one month - cyclobenzaprine (FLEXERIL) 5 MG tablet; TAKE ONE AND ONE-HALF TABLETS BY MOUTH EVERY NIGHT AT BEDTIME  Dispense: 90 tablet; Refill: 0 - oxyCODONE (OXY IR/ROXICODONE) 5 MG immediate release tablet; Take 1 tablet (5 mg total) by mouth every 8 (eight) hours as needed for moderate pain.  Dispense: 90 tablet; Refill: 0   3. Need for hepatitis B vaccination  - Hepatitis B vaccine adult IM  Nasiir Monts [facility] 03/04/2017 2:26 PM

## 2017-03-05 ENCOUNTER — Other Ambulatory Visit
Admission: RE | Admit: 2017-03-05 | Discharge: 2017-03-05 | Disposition: A | Discharge status: Home / Self Care | Payer: Medicare HMO | Source: Ambulatory Visit | Attending: Gastroenterology | Admitting: Gastroenterology

## 2017-03-05 DIAGNOSIS — R188 Other ascites: Secondary | ICD-10-CM

## 2017-03-05 DIAGNOSIS — K7460 Unspecified cirrhosis of liver: Secondary | ICD-10-CM | POA: Insufficient documentation

## 2017-03-05 LAB — HEPATIC FUNCTION PANEL
ALBUMIN: 2.9 g/dL — AB (ref 3.5–5.0)
ALT: 27 U/L (ref 14–54)
AST: 45 U/L — ABNORMAL HIGH (ref 15–41)
Alkaline Phosphatase: 150 U/L — ABNORMAL HIGH (ref 38–126)
BILIRUBIN TOTAL: 6.7 mg/dL — AB (ref 0.3–1.2)
Bilirubin, Direct: 1.6 mg/dL — ABNORMAL HIGH (ref 0.1–0.5)
Indirect Bilirubin: 5.1 mg/dL — ABNORMAL HIGH (ref 0.3–0.9)
Total Protein: 6.9 g/dL (ref 6.5–8.1)

## 2017-03-05 LAB — BASIC METABOLIC PANEL
Anion gap: 7 (ref 5–15)
BUN: 10 mg/dL (ref 6–20)
CHLORIDE: 103 mmol/L (ref 101–111)
CO2: 26 mmol/L (ref 22–32)
CREATININE: 0.79 mg/dL (ref 0.44–1.00)
Calcium: 8.7 mg/dL — ABNORMAL LOW (ref 8.9–10.3)
GFR calc Af Amer: >60 mL/min (ref >60)
GFR calc non Af Amer: >60 mL/min (ref >60)
GLUCOSE: 165 mg/dL — AB (ref 65–99)
POTASSIUM: 4.2 mmol/L (ref 3.5–5.1)
SODIUM: 136 mmol/L (ref 135–145)

## 2017-03-05 LAB — MAGNESIUM: MAGNESIUM: 1.7 mg/dL (ref 1.7–2.4)

## 2017-03-10 ENCOUNTER — Encounter: Payer: Self-pay | Admitting: Gastroenterology

## 2017-03-10 LAB — MISC LABCORP TEST (SEND OUT): LABCORP TEST CODE: 3343

## 2017-03-11 ENCOUNTER — Encounter
Admission: RE | Disposition: A | Discharge status: Home / Self Care | Payer: Self-pay | Source: Ambulatory Visit | Attending: Gastroenterology

## 2017-03-11 ENCOUNTER — Other Ambulatory Visit: Payer: Self-pay

## 2017-03-11 ENCOUNTER — Ambulatory Visit
Admission: RE | Admit: 2017-03-11 | Discharge: 2017-03-11 | Disposition: A | Discharge status: Home / Self Care | Payer: Medicare HMO | Source: Ambulatory Visit | Attending: Gastroenterology | Admitting: Gastroenterology

## 2017-03-11 ENCOUNTER — Ambulatory Visit: Payer: Medicare HMO | Admitting: Anesthesiology

## 2017-03-11 ENCOUNTER — Encounter: Payer: Self-pay | Admitting: *Deleted

## 2017-03-11 DIAGNOSIS — K279 Peptic ulcer, site unspecified, unspecified as acute or chronic, without hemorrhage or perforation: Secondary | ICD-10-CM | POA: Diagnosis not present

## 2017-03-11 DIAGNOSIS — Z79899 Other long term (current) drug therapy: Secondary | ICD-10-CM | POA: Insufficient documentation

## 2017-03-11 DIAGNOSIS — J449 Chronic obstructive pulmonary disease, unspecified: Secondary | ICD-10-CM | POA: Diagnosis not present

## 2017-03-11 DIAGNOSIS — E785 Hyperlipidemia, unspecified: Secondary | ICD-10-CM | POA: Insufficient documentation

## 2017-03-11 DIAGNOSIS — K209 Esophagitis, unspecified without bleeding: Secondary | ICD-10-CM

## 2017-03-11 DIAGNOSIS — K269 Duodenal ulcer, unspecified as acute or chronic, without hemorrhage or perforation: Secondary | ICD-10-CM | POA: Insufficient documentation

## 2017-03-11 DIAGNOSIS — K766 Portal hypertension: Secondary | ICD-10-CM | POA: Insufficient documentation

## 2017-03-11 DIAGNOSIS — K7460 Unspecified cirrhosis of liver: Secondary | ICD-10-CM | POA: Diagnosis not present

## 2017-03-11 DIAGNOSIS — Z96651 Presence of right artificial knee joint: Secondary | ICD-10-CM | POA: Diagnosis not present

## 2017-03-11 DIAGNOSIS — R7879 Finding of abnormal level of heavy metals in blood: Secondary | ICD-10-CM

## 2017-03-11 DIAGNOSIS — K3189 Other diseases of stomach and duodenum: Secondary | ICD-10-CM | POA: Diagnosis not present

## 2017-03-11 DIAGNOSIS — Z8249 Family history of ischemic heart disease and other diseases of the circulatory system: Secondary | ICD-10-CM | POA: Diagnosis not present

## 2017-03-11 DIAGNOSIS — F329 Major depressive disorder, single episode, unspecified: Secondary | ICD-10-CM | POA: Insufficient documentation

## 2017-03-11 DIAGNOSIS — K635 Polyp of colon: Secondary | ICD-10-CM | POA: Diagnosis not present

## 2017-03-11 DIAGNOSIS — I864 Gastric varices: Secondary | ICD-10-CM | POA: Diagnosis not present

## 2017-03-11 DIAGNOSIS — K644 Residual hemorrhoidal skin tags: Secondary | ICD-10-CM | POA: Diagnosis not present

## 2017-03-11 DIAGNOSIS — Z1211 Encounter for screening for malignant neoplasm of colon: Secondary | ICD-10-CM | POA: Insufficient documentation

## 2017-03-11 DIAGNOSIS — K621 Rectal polyp: Secondary | ICD-10-CM | POA: Diagnosis not present

## 2017-03-11 DIAGNOSIS — M170 Bilateral primary osteoarthritis of knee: Secondary | ICD-10-CM | POA: Diagnosis not present

## 2017-03-11 DIAGNOSIS — Z888 Allergy status to other drugs, medicaments and biological substances status: Secondary | ICD-10-CM | POA: Insufficient documentation

## 2017-03-11 DIAGNOSIS — Z885 Allergy status to narcotic agent status: Secondary | ICD-10-CM | POA: Insufficient documentation

## 2017-03-11 DIAGNOSIS — I110 Hypertensive heart disease with heart failure: Secondary | ICD-10-CM | POA: Diagnosis not present

## 2017-03-11 DIAGNOSIS — Z6841 Body Mass Index (BMI) 40.0 and over, adult: Secondary | ICD-10-CM | POA: Insufficient documentation

## 2017-03-11 DIAGNOSIS — I10 Essential (primary) hypertension: Secondary | ICD-10-CM | POA: Diagnosis not present

## 2017-03-11 DIAGNOSIS — K7581 Nonalcoholic steatohepatitis (NASH): Secondary | ICD-10-CM

## 2017-03-11 DIAGNOSIS — D128 Benign neoplasm of rectum: Secondary | ICD-10-CM | POA: Diagnosis not present

## 2017-03-11 DIAGNOSIS — K259 Gastric ulcer, unspecified as acute or chronic, without hemorrhage or perforation: Secondary | ICD-10-CM | POA: Diagnosis not present

## 2017-03-11 DIAGNOSIS — I340 Nonrheumatic mitral (valve) insufficiency: Secondary | ICD-10-CM | POA: Insufficient documentation

## 2017-03-11 DIAGNOSIS — K210 Gastro-esophageal reflux disease with esophagitis: Secondary | ICD-10-CM | POA: Diagnosis not present

## 2017-03-11 DIAGNOSIS — F419 Anxiety disorder, unspecified: Secondary | ICD-10-CM | POA: Diagnosis not present

## 2017-03-11 DIAGNOSIS — I5032 Chronic diastolic (congestive) heart failure: Secondary | ICD-10-CM | POA: Insufficient documentation

## 2017-03-11 DIAGNOSIS — R826 Abnormal urine levels of substances chiefly nonmedicinal as to source: Secondary | ICD-10-CM

## 2017-03-11 DIAGNOSIS — F17210 Nicotine dependence, cigarettes, uncomplicated: Secondary | ICD-10-CM | POA: Diagnosis not present

## 2017-03-11 HISTORY — PX: COLONOSCOPY WITH PROPOFOL: SHX5780

## 2017-03-11 HISTORY — PX: ESOPHAGOGASTRODUODENOSCOPY (EGD) WITH PROPOFOL: SHX5813

## 2017-03-11 SURGERY — COLONOSCOPY WITH PROPOFOL
Anesthesia: General

## 2017-03-11 MED ORDER — LIDOCAINE HCL (PF) 2 % IJ SOLN
INTRAMUSCULAR | Status: AC
  Filled 2017-03-11: qty 2

## 2017-03-11 MED ORDER — OMEPRAZOLE 40 MG PO CPDR: 40.0000 mg | DELAYED_RELEASE_CAPSULE | Freq: Two times a day (BID) | ORAL | 0 refills | Status: AC

## 2017-03-11 MED ORDER — ALBUTEROL SULFATE HFA 108 (90 BASE) MCG/ACT IN AERS
INHALATION_SPRAY | RESPIRATORY_TRACT | Status: DC | PRN
  Administered 2017-03-11: 2 via RESPIRATORY_TRACT

## 2017-03-11 MED ORDER — GLYCOPYRROLATE 0.2 MG/ML IJ SOLN
INTRAMUSCULAR | Status: DC | PRN
  Administered 2017-03-11: 0.1 mg via INTRAVENOUS

## 2017-03-11 MED ORDER — GLYCOPYRROLATE 0.2 MG/ML IJ SOLN
INTRAMUSCULAR | Status: AC
  Filled 2017-03-11: qty 1

## 2017-03-11 MED ORDER — FENTANYL CITRATE (PF) 100 MCG/2ML IJ SOLN
INTRAMUSCULAR | Status: AC
  Filled 2017-03-11: qty 2

## 2017-03-11 MED ORDER — SODIUM CHLORIDE 0.9 % IV SOLN
INTRAVENOUS | Status: DC
  Administered 2017-03-11 (×2): via INTRAVENOUS

## 2017-03-11 MED ORDER — FENTANYL CITRATE (PF) 100 MCG/2ML IJ SOLN
INTRAMUSCULAR | Status: DC | PRN
  Administered 2017-03-11 (×2): 25 ug via INTRAVENOUS
  Administered 2017-03-11: 50 ug via INTRAVENOUS

## 2017-03-11 MED ORDER — EPHEDRINE SULFATE 50 MG/ML IJ SOLN
INTRAMUSCULAR | Status: AC
  Filled 2017-03-11: qty 1

## 2017-03-11 MED ORDER — MIDAZOLAM HCL 2 MG/2ML IJ SOLN
INTRAMUSCULAR | Status: AC
  Filled 2017-03-11: qty 2

## 2017-03-11 MED ORDER — EPHEDRINE SULFATE 50 MG/ML IJ SOLN
INTRAMUSCULAR | Status: DC | PRN
  Administered 2017-03-11: 5 mg via INTRAVENOUS

## 2017-03-11 MED ORDER — PROPOFOL 10 MG/ML IV BOLUS
INTRAVENOUS | Status: AC
  Filled 2017-03-11: qty 20

## 2017-03-11 MED ORDER — MIDAZOLAM HCL 2 MG/2ML IJ SOLN
INTRAMUSCULAR | Status: DC | PRN
  Administered 2017-03-11: 2 mg via INTRAVENOUS

## 2017-03-11 MED ORDER — LIDOCAINE HCL (PF) 2 % IJ SOLN
INTRAMUSCULAR | Status: AC
Start: 2017-03-11 — End: 2017-03-11
  Filled 2017-03-11: qty 2

## 2017-03-11 MED ORDER — LIDOCAINE HCL (CARDIAC) 20 MG/ML IV SOLN
INTRAVENOUS | Status: DC | PRN
  Administered 2017-03-11: 30 mg via INTRAVENOUS

## 2017-03-11 MED ORDER — PROPOFOL 500 MG/50ML IV EMUL
INTRAVENOUS | Status: DC | PRN
  Administered 2017-03-11: 120 ug/kg/min via INTRAVENOUS

## 2017-03-11 MED ORDER — PROPOFOL 500 MG/50ML IV EMUL
INTRAVENOUS | Status: AC
  Filled 2017-03-11: qty 50

## 2017-03-11 MED ORDER — ALBUTEROL SULFATE HFA 108 (90 BASE) MCG/ACT IN AERS
INHALATION_SPRAY | RESPIRATORY_TRACT | Status: AC
  Filled 2017-03-11: qty 6.7

## 2017-03-11 NOTE — Transfer of Care (Signed)
Immediate Anesthesia Transfer of Care Note  Patient: Leah Shah  Procedure(s) Performed: Procedure(s): COLONOSCOPY WITH PROPOFOL (N/A) ESOPHAGOGASTRODUODENOSCOPY (EGD) WITH PROPOFOL (N/A)  Patient Location: PACU  Anesthesia Type:General  Level of Consciousness: awake and sedated  Airway & Oxygen Therapy: Patient Spontanous Breathing and Patient connected to nasal cannula oxygen  Post-op Assessment: Report given to RN and Post -op Vital signs reviewed and stable  Post vital signs: Reviewed and stable  Last Vitals:  Vitals:   03/11/17 0809  BP: 139/67  Pulse: 74  Resp: 18  Temp: 36.4 C  SpO2: 98%    Last Pain:  Vitals:   03/11/17 0809  TempSrc: Tympanic  PainSc: 5          Complications: No apparent anesthesia complications

## 2017-03-11 NOTE — Anesthesia Post-op Follow-up Note (Signed)
Anesthesia QCDR form completed.        

## 2017-03-11 NOTE — Anesthesia Preprocedure Evaluation (Signed)
Anesthesia Evaluation  Patient identified by MRN, date of birth, ID band Patient awake    Reviewed: Allergy & Precautions, NPO status , Patient's Chart, lab work & pertinent test results  History of Anesthesia Complications Negative for: history of anesthetic complications  Airway Mallampati: III       Dental   Pulmonary COPD,  COPD inhaler, Current Smoker,           Cardiovascular hypertension, Pt. on medications and Pt. on home beta blockers +CHF       Neuro/Psych neg Seizures Anxiety Depression    GI/Hepatic Neg liver ROS, GERD  ,  Endo/Other  neg diabetes  Renal/GU negative Renal ROS     Musculoskeletal   Abdominal   Peds  Hematology   Anesthesia Other Findings   Reproductive/Obstetrics                             Anesthesia Physical Anesthesia Plan  ASA: III  Anesthesia Plan: General   Post-op Pain Management:    Induction:   PONV Risk Score and Plan: Propofol infusion  Airway Management Planned: Nasal Cannula  Additional Equipment:   Intra-op Plan:   Post-operative Plan:   Informed Consent: I have reviewed the patients History and Physical, chart, labs and discussed the procedure including the risks, benefits and alternatives for the proposed anesthesia with the patient or authorized representative who has indicated his/her understanding and acceptance.     Plan Discussed with:   Anesthesia Plan Comments:         Anesthesia Quick Evaluation

## 2017-03-11 NOTE — Op Note (Signed)
[facility] Gastroenterology Patient Name: Leah Shah Procedure Date: 03/11/2017 8:52 AM MRN: [id number] Account #: [id number] Date of Birth: 04-22-1956 Admit Type: Outpatient Age: 61 Room: [facility] ENDO ROOM 3 Gender: Female Note Status: Finalized Procedure:            Upper GI endoscopy Indications:          Variceal screening (no known varices or prior                        bleeding), Exclusion of gastric varices Providers:            Lin Landsman MD, MD Referring MD:         Otila Back. Manuella Ghazi (Referring MD) Medicines:            Monitored Anesthesia Care Complications:        No immediate complications. Estimated blood loss: None. Procedure:            Pre-Anesthesia Assessment:                       - Prior to the procedure, a History and Physical was                        performed, and patient medications and allergies were                        reviewed. The patient is competent. The risks and                        benefits of the procedure and the sedation options and                        risks were discussed with the patient. All questions                        were answered and informed consent was obtained.                        Patient identification and proposed procedure were                        verified by the physician, the nurse, the                        anesthesiologist, the anesthetist and the technician in                        the pre-procedure area in the procedure room. Mental                        Status Examination: alert and oriented. Airway                        Examination: normal oropharyngeal airway and neck                        mobility. Respiratory Examination: clear to                        auscultation. CV Examination: normal. Prophylactic  Antibiotics: The patient does not require prophylactic                        antibiotics. Prior Anticoagulants: The patient has   taken no previous anticoagulant or antiplatelet agents.                        ASA Grade Assessment: III - A patient with severe                        systemic disease. After reviewing the risks and                        benefits, the patient was deemed in satisfactory                        condition to undergo the procedure. The anesthesia plan                        was to use monitored anesthesia care (MAC). Immediately                        prior to administration of medications, the patient was                        re-assessed for adequacy to receive sedatives. The                        heart rate, respiratory rate, oxygen saturations, blood                        pressure, adequacy of pulmonary ventilation, and                        response to care were monitored throughout the                        procedure. The physical status of the patient was                        re-assessed after the procedure.                       After obtaining informed consent, the endoscope was                        passed under direct vision. Throughout the procedure,                        the patient's blood pressure, pulse, and oxygen                        saturations were monitored continuously. The Endoscope                        was introduced through the mouth, and advanced to the                        third part of duodenum. The upper GI endoscopy was  accomplished without difficulty. The patient tolerated                        the procedure well. Findings:      One non-bleeding cratered duodenal ulcer with a clean ulcer base       (Forrest Class III) was found in the duodenal bulb. The lesion was 5 mm       in largest dimension.      Severe portal hypertensive gastropathy was found in the entire examined       stomach.      Type 1 isolated gastric varices (IGV1, varices located in the fundus)       with no bleeding were found in the gastric fundus. There  were no       stigmata of recent bleeding. They were large in largest diameter.      LA Grade A (one or more mucosal breaks less than 5 mm, not extending       between tops of 2 mucosal folds) esophagitis with no bleeding was found.       No evidence of esophageal varices Impression:           - One non-bleeding duodenal ulcer with a clean ulcer                        base (Forrest Class III).                       - Portal hypertensive gastropathy.                       - Type 1 isolated gastric varices (IGV1, varices                        located in the fundus), without bleeding.                       - LA Grade A reflux esophagitis.                       - No specimens collected. Recommendation:       - Use Prilosec (omeprazole) 40 mg PO BID for 3 months.                       - Check H Pylori IgG and treat if positive                       - Imaging to evalute for splenic vein thrombosis due to                        presence of isolated gastric varices                       - Repeat upper endoscopy in 1 year for variceal                        surveillance or sooner if needed. Procedure Code(s):    --- Professional ---                       [phone], Esophagogastroduodenoscopy, flexible, transoral;  diagnostic, including collection of specimen(s) by                        brushing or washing, when performed (separate procedure) Diagnosis Code(s):    --- Professional ---                       K26.9, Duodenal ulcer, unspecified as acute or chronic,                        without hemorrhage or perforation                       K76.6, Portal hypertension                       K31.89, Other diseases of stomach and duodenum                       I86.4, Gastric varices                       K21.0, Gastro-esophageal reflux disease with esophagitis                       Z13.810, Encounter for screening for upper                        gastrointestinal disorder CPT  copyright 2016 American Medical Association. All rights reserved. The codes documented in this report are preliminary and upon coder review may  be revised to meet current compliance requirements. Dr. Ulyess Mort Lin Landsman MD, MD 03/11/2017 71:69:67 AM This report has been signed electronically. Number of Addenda: 0 Note Initiated On: 03/11/2017 8:52 AM      [facility]

## 2017-03-11 NOTE — Anesthesia Procedure Notes (Signed)
Performed by: COOK-MARTIN, Buford Gayler Pre-anesthesia Checklist: Patient identified, Emergency Drugs available, Suction available, Patient being monitored and Timeout performed Patient Re-evaluated:Patient Re-evaluated prior to induction Oxygen Delivery Method: Nasal cannula Preoxygenation: Pre-oxygenation with 100% oxygen Induction Type: IV induction Airway Equipment and Method: Bite block Placement Confirmation: CO2 detector and positive ETCO2       

## 2017-03-11 NOTE — H&P (Signed)
Cephas Darby, MD [address]  [place]  [place],  [address]  Main: [phone]  Fax: [phone] Pager: [phone]  Primary Care Physician:  Roselee Nova, MD Primary Gastroenterologist:  Dr. Cephas Darby  Pre-Procedure History & Physical: HPI:  NAZIAH PORTEE is a 61 y.o. female is here for an EGD and colonoscopy.   Past Medical History:  Diagnosis Date  . Anxiety   . Arthritis    R knee, L knee - OA  . Chronic diastolic CHF (congestive heart failure) ([place])    a. 06/2016 Echo: EF 60-65%, no rwma, mod MR, mildly dil LA, nl RV fxn, PASP 22mmHg.  [name] Dysrhythmia    "mild arrythmia after taking Redux diet med years ago"no cardio fi  . Headache(784.0)   . History of stress test    a. 06/2016 MV: EF 67%, no ischemia/infarct.  . Hyperlipidemia   . Hypertension    echoJefm Bryant, wnl 2009? , had taken Redux for diet management  but now  off the market. pt, told that echo wnl.    . Moderate mitral regurgitation    a. 06/2016 Echo: EF 60-65%, mod MR.  . Morbid obesity ([place])   . Osteoarthritis   . Recurrent upper respiratory infection (URI)    treated /w OTC med.     Past Surgical History:  Procedure Laterality Date  . ABDOMINAL HYSTERECTOMY     partial-uterus removed  . CHOLECYSTECTOMY     2011  . COCCYX REMOVAL     2004[facility]  . EYE SURGERY     states both lens replaced. Not sure if cataract surgery or not.  [name] HEEL SPUR SURGERY    . I&D EXTREMITY  08/27/2011   Procedure: IRRIGATION AND DEBRIDEMENT EXTREMITY;  Surgeon: [name], MD;  Location: [place];  Service: Orthopedics;  Laterality: Right;  . JOINT REPLACEMENT Right 2012   Right TKR  . KNEE ARTHROPLASTY  07/28/2011   Procedure: COMPUTER ASSISTED TOTAL KNEE ARTHROPLASTY;  Surgeon: [name], MD;  Location: [place];  Service: Orthopedics;  Laterality: Right;  right total knee arthroplasty, computer assisted  . KNEE ARTHROSCOPY     right knee  . TMJ ARTHROPLASTY     [place]     Prior to Admission medications   Medication Sig Start Date End Date Taking? Authorizing Provider  albuterol (PROVENTIL HFA;VENTOLIN HFA) 108 (90 Base) MCG/ACT inhaler Inhale 2 puffs into the lungs every 6 (six) hours as needed for wheezing or shortness of breath. 08/29/16   Loletha Grayer, MD  alprazolam Duanne Moron) 2 MG tablet Take 1 tablet (2 mg total) by mouth 2 (two) times daily as needed for anxiety. 03/04/17   Roselee Nova, MD  aspirin 81 MG EC tablet Take 1 tablet (81 mg total) by mouth daily. 08/30/16   Loletha Grayer, MD  atenolol (TENORMIN) 100 MG tablet TAKE 1 TABLET (100 MG TOTAL) BY MOUTH DAILY. 12/28/16   Roselee Nova, MD  citalopram (CELEXA) 10 MG tablet Take 1 tablet (10 mg total) by mouth daily. 02/04/17   Roselee Nova, MD  cyclobenzaprine (FLEXERIL) 5 MG tablet TAKE ONE AND ONE-HALF TABLETS BY MOUTH EVERY NIGHT AT BEDTIME 03/04/17   Roselee Nova, MD  furosemide (LASIX) 40 MG tablet Take 2 tablets (80 mg total) by mouth daily. In the morning 02/16/17 03/18/17  Lin Landsman, MD  hydrOXYzine (ATARAX/VISTARIL) 25 MG tablet Take 1 tablet (25 mg total) by  mouth every 8 (eight) hours as needed. 01/07/17   Roselee Nova, MD  lactulose (CHRONULAC) 10 GM/15ML solution Take 45 mLs (30 g total) by mouth 2 (two) times daily. 02/16/17 03/18/17  Lin Landsman, MD  oxyCODONE (OXY IR/ROXICODONE) 5 MG immediate release tablet Take 1 tablet (5 mg total) by mouth every 8 (eight) hours as needed for moderate pain. 03/04/17   Roselee Nova, MD  potassium chloride SA (K-DUR,KLOR-CON) 20 MEQ tablet  11/09/16   [provider]  spironolactone (ALDACTONE) 100 MG tablet Take 1.5 tablets (150 mg total) by mouth daily. 02/16/17 03/18/17  Lin Landsman, MD  Vitamin D, Ergocalciferol, (DRISDOL) 50000 units CAPS capsule Take 1 capsule (50,000 Units total) by mouth every 7 (seven) days. 03/03/17 06/17/17  Lin Landsman, MD    Allergies as of 02/16/2017 - Review  Complete 02/04/2017  Allergen Reaction Noted  . Bacitracin-neomycin-polymyxin Other (See Comments) 03/14/2015  . Codeine Other (See Comments) 05/12/2011  . Gabapentin Nausea Only 12/13/2014  . Neosporin  [neomycin-bacitracin zn-polymyx]  12/13/2014  . Pregabalin Other (See Comments) 12/13/2014    Family History  Problem Relation Age of Onset  . Heart disease Mother   . Hypertension Mother   . Hypertension Father   . Heart disease Sister 26       stent   . Heart attack Sister   . Hyperlipidemia Sister   . Hypertension Sister   . Heart attack Maternal Uncle 64  . Heart disease Maternal Uncle        CABG    Social History   Social History  . Marital status: Married    Spouse name: N/A  . Number of children: N/A  . Years of education: N/A   Occupational History  . Not on file.   Social History Main Topics  . Smoking status: Current Every Day Smoker    Packs/day: 0.25    Years: 50.00    Types: Cigarettes  . Smokeless tobacco: Never Used  . Alcohol use No  . Drug use: No  . Sexual activity: Not Currently   Other Topics Concern  . Not on file   Social History Narrative   Lives in [place] with husband.  Does not routinely exercise.    Review of Systems: See HPI, otherwise negative ROS  Physical Exam: There were no vitals taken for this visit. General:   Alert,  pleasant and cooperative in NAD Head:  Normocephalic and atraumatic. Neck:  Supple; no masses or thyromegaly. Lungs:  Clear throughout to auscultation.    Heart:  Regular rate and rhythm. Abdomen:  Soft, nontender and distended. Normal bowel sounds, without guarding, and without rebound.   Neurologic:  Alert and  oriented x4;  grossly normal neurologically.  Impression/Plan: JONE PANEBIANCO is here for an EGD and a colonoscopy to be performed for variceal screening and colon cancer screening  Risks, benefits, limitations, and alternatives regarding  endoscopy and colonoscopy have been reviewed  with the patient.  Questions have been answered.  All parties agreeable.   Sherri Sear, MD  03/11/2017, 8:07 AM

## 2017-03-11 NOTE — Anesthesia Postprocedure Evaluation (Signed)
Anesthesia Post Note  Patient: Leah Shah  Procedure(s) Performed: Procedure(s) (LRB): COLONOSCOPY WITH PROPOFOL (N/A) ESOPHAGOGASTRODUODENOSCOPY (EGD) WITH PROPOFOL (N/A)  Patient location during evaluation: Endoscopy Anesthesia Type: General Level of consciousness: awake and alert Pain management: pain level controlled Vital Signs Assessment: post-procedure vital signs reviewed and stable Respiratory status: spontaneous breathing and respiratory function stable Cardiovascular status: stable Anesthetic complications: no     Last Vitals:  Vitals:   03/11/17 0809 03/11/17 0950  BP: 139/67 121/63  Pulse: 74 76  Resp: 18 18  Temp: 36.4 C (!) 36.1 C  SpO2: 98% 100%    Last Pain:  Vitals:   03/11/17 0950  TempSrc: Tympanic  PainSc:                  KEPHART,WILLIAM K

## 2017-03-11 NOTE — Op Note (Signed)
[facility] Gastroenterology Patient Name: Leah Shah Procedure Date: 03/11/2017 8:51 AM MRN: [id number] Account #: [id number] Date of Birth: 04-29-1956 Admit Type: Outpatient Age: 61 Room: [facility] ENDO ROOM 3 Gender: Female Note Status: Finalized Procedure:            Colonoscopy Indications:          Screening for colorectal malignant neoplasm, This is                        the patient's first colonoscopy Providers:            Lin Landsman MD, MD Referring MD:         Otila Back. Manuella Ghazi (Referring MD) Medicines:            Monitored Anesthesia Care Complications:        No immediate complications. Estimated blood loss:                        Minimal. Procedure:            Pre-Anesthesia Assessment:                       - Prior to the procedure, a History and Physical was                        performed, and patient medications and allergies were                        reviewed. The patient is competent. The risks and                        benefits of the procedure and the sedation options and                        risks were discussed with the patient. All questions                        were answered and informed consent was obtained.                        Patient identification and proposed procedure were                        verified by the physician, the nurse, the                        anesthesiologist, the anesthetist and the technician in                        the pre-procedure area in the procedure room. Mental                        Status Examination: alert and oriented. Airway                        Examination: normal oropharyngeal airway and neck                        mobility. Respiratory Examination: clear to  auscultation. CV Examination: normal. Prophylactic                        Antibiotics: The patient does not require prophylactic                        antibiotics. Prior Anticoagulants: The patient has                   taken no previous anticoagulant or antiplatelet agents.                        ASA Grade Assessment: III - A patient with severe                        systemic disease. After reviewing the risks and                        benefits, the patient was deemed in satisfactory                        condition to undergo the procedure. The anesthesia plan                        was to use monitored anesthesia care (MAC). Immediately                        prior to administration of medications, the patient was                        re-assessed for adequacy to receive sedatives. The                        heart rate, respiratory rate, oxygen saturations, blood                        pressure, adequacy of pulmonary ventilation, and                        response to care were monitored throughout the                        procedure. The physical status of the patient was                        re-assessed after the procedure.                       After obtaining informed consent, the colonoscope was                        passed under direct vision. Throughout the procedure,                        the patient's blood pressure, pulse, and oxygen                        saturations were monitored continuously. The                        Colonoscope was introduced through the anus and  advanced to the the cecum, identified by appendiceal                        orifice and ileocecal valve. The colonoscopy was                        performed without difficulty. The patient tolerated the                        procedure well. The quality of the bowel preparation                        was evaluated using the BBPS [facility] Bowel Preparation                        Scale) with scores of: Right Colon = 3 (entire mucosa                        seen well with no residual staining, small fragments of                        stool or opaque liquid), Transverse Colon = 3  (entire                        mucosa seen well with no residual staining, small                        fragments of stool or opaque liquid) and Left Colon = 2                        (minor amount of residual staining, small fragments of                        stool and/or opaque liquid, but mucosa seen well). The                        total BBPS score equals 8. Findings:      The perianal exam findings include non-thrombosed external hemorrhoids.      A moderate amount of liquid stool was found in the sigmoid colon,       precluding visualization. Lavage of the area was performed using greater       than 500 mL of normal saline, resulting in clearance with adequate       visualization.      A 6 mm polyp was found in the rectum. The polyp was sessile. The polyp       was removed with a cold snare. Resection and retrieval were complete.      The retroflexed view of the distal rectum and anal verge was normal and       showed no anal or rectal abnormalities. Impression:           - Non-thrombosed external hemorrhoids found on perianal                        exam.                       - Stool in the sigmoid colon.                       -  One 6 mm polyp in the rectum, removed with a cold                        snare. Resected and retrieved.                       - The distal rectum and anal verge are normal on                        retroflexion view. Recommendation:       - Discharge patient to home.                       - Low sodium diet today.                       - Continue present medications.                       - Await pathology results.                       - Repeat colonoscopy in 5 years for surveillance based                        on pathology results.                       - Return to GI clinic as previously scheduled. Procedure Code(s):    --- Professional ---                       [phone], Colonoscopy, flexible; with removal of tumor(s),                        polyp(s),  or other lesion(s) by snare technique Diagnosis Code(s):    --- Professional ---                       Z12.11, Encounter for screening for malignant neoplasm                        of colon                       K64.4, Residual hemorrhoidal skin tags                       K62.1, Rectal polyp CPT copyright 2016 American Medical Association. All rights reserved. The codes documented in this report are preliminary and upon coder review may  be revised to meet current compliance requirements. Dr. Ulyess Mort Lin Landsman MD, MD 03/11/2017 10:12:33 AM This report has been signed electronically. Number of Addenda: 0 Note Initiated On: 03/11/2017 8:51 AM Scope Withdrawal Time: 0 hours 28 minutes 12 seconds  Total Procedure Duration: 0 hours 33 minutes 50 seconds       [facility]

## 2017-03-12 ENCOUNTER — Encounter: Payer: Self-pay | Admitting: Gastroenterology

## 2017-03-12 ENCOUNTER — Other Ambulatory Visit: Payer: Self-pay | Admitting: Gastroenterology

## 2017-03-12 ENCOUNTER — Other Ambulatory Visit: Payer: Self-pay

## 2017-03-12 LAB — H. PYLORI ANTIBODY, IGG: H PYLORI IGG: 2.07 {index_val} — AB (ref 0.00–0.79)

## 2017-03-12 LAB — SURGICAL PATHOLOGY

## 2017-03-12 MED ORDER — AMOXICILLIN 500 MG PO TABS: 1000.0000 mg | ORAL_TABLET | Freq: Two times a day (BID) | ORAL | 0 refills | Status: AC

## 2017-03-12 MED ORDER — CLARITHROMYCIN 500 MG PO TABS: 500.0000 mg | ORAL_TABLET | Freq: Two times a day (BID) | ORAL | 0 refills | Status: AC

## 2017-03-17 ENCOUNTER — Ambulatory Visit
Admission: RE | Admit: 2017-03-17 | Discharge: 2017-03-17 | Disposition: A | Discharge status: Home / Self Care | Payer: Medicare HMO | Source: Ambulatory Visit | Attending: Gastroenterology | Admitting: Gastroenterology

## 2017-03-17 DIAGNOSIS — K760 Fatty (change of) liver, not elsewhere classified: Secondary | ICD-10-CM | POA: Diagnosis present

## 2017-03-17 DIAGNOSIS — K7460 Unspecified cirrhosis of liver: Secondary | ICD-10-CM | POA: Diagnosis not present

## 2017-03-17 DIAGNOSIS — I864 Gastric varices: Secondary | ICD-10-CM | POA: Diagnosis not present

## 2017-03-17 DIAGNOSIS — Z961 Presence of intraocular lens: Secondary | ICD-10-CM | POA: Diagnosis not present

## 2017-03-17 DIAGNOSIS — Z9689 Presence of other specified functional implants: Secondary | ICD-10-CM | POA: Diagnosis not present

## 2017-03-17 MED ORDER — GADOBENATE DIMEGLUMINE 529 MG/ML IV SOLN
20.0000 mL | Freq: Once | INTRAVENOUS | Status: AC | PRN
  Administered 2017-03-17: 20 mL via INTRAVENOUS

## 2017-03-19 ENCOUNTER — Telehealth: Payer: Self-pay

## 2017-03-19 ENCOUNTER — Telehealth: Payer: Self-pay | Admitting: Gastroenterology

## 2017-03-19 NOTE — Telephone Encounter (Signed)
Patient left a voice message that she is returning your call °

## 2017-03-19 NOTE — Telephone Encounter (Signed)
Dr. Marius Ditch, patients insurance is not authorizing rx for the clarithromycin because pt is on simvastatin.  The pharmacist said she tried to override yesterday when I gave her the ok from you to give it. Insurance is not allowing it.   I will contact the insurance company to override.  Thanks [employer]

## 2017-03-22 NOTE — Telephone Encounter (Signed)
LVM informing patient what was going on with her rx for clarithromycin- still waiting for approval from insurance. Thanks [employer]

## 2017-03-23 ENCOUNTER — Telehealth: Payer: Self-pay

## 2017-03-23 ENCOUNTER — Telehealth: Payer: Self-pay | Admitting: Gastroenterology

## 2017-03-23 DIAGNOSIS — K7460 Unspecified cirrhosis of liver: Secondary | ICD-10-CM

## 2017-03-23 DIAGNOSIS — R188 Other ascites: Principal | ICD-10-CM

## 2017-03-23 NOTE — Telephone Encounter (Signed)
Leah Shah has stopped taking simvastatin since July 2018 when she found out that she has cirrhosis. And, Dr. Manuella Ghazi, stopped her simvastatin in July 2018. She hasn't taken furosemide in last couple of days as she has been traveling with her husband as her father-in-law passed away. She reports cramps in her legs. She hasn't started treatment for H. pylori infection. She is taking omeprazole 2 times daily. She had a recent appointment with ophthalmology and there was no evidence of KF Ring. Therefore, I recommend ultrasound-guided liver biopsy for copper quantification to confirm if she has Wilson's disease.  Leah Darby, MD [address]  [place]  [place], [place] [address]  Main: [phone]  Fax: [phone] Pager: [phone]

## 2017-03-23 NOTE — Telephone Encounter (Signed)
Patient picked up rx for Clarithromycin yesterday.  She said she is having leg cramps and wanted to know if its from the potassium being low.  She takes 1 1/2 tablet of the potassium.  She would like to know the results of her MRI..  I told her that you would be calling her today.  Call before 1 pm because she has a funeral to attend. Thanks [employer]

## 2017-03-26 ENCOUNTER — Other Ambulatory Visit: Payer: Self-pay | Admitting: Gastroenterology

## 2017-03-27 ENCOUNTER — Other Ambulatory Visit: Payer: Self-pay | Admitting: Family Medicine

## 2017-03-27 DIAGNOSIS — E785 Hyperlipidemia, unspecified: Secondary | ICD-10-CM

## 2017-03-27 DIAGNOSIS — I10 Essential (primary) hypertension: Secondary | ICD-10-CM

## 2017-03-30 ENCOUNTER — Ambulatory Visit: Payer: Medicare HMO | Admitting: Internal Medicine

## 2017-04-02 ENCOUNTER — Other Ambulatory Visit
Admission: RE | Admit: 2017-04-02 | Discharge: 2017-04-02 | Disposition: A | Discharge status: Home / Self Care | Payer: Medicare HMO | Source: Ambulatory Visit | Attending: Gastroenterology | Admitting: Gastroenterology

## 2017-04-02 ENCOUNTER — Ambulatory Visit (INDEPENDENT_AMBULATORY_CARE_PROVIDER_SITE_OTHER): Payer: Medicare HMO | Admitting: Gastroenterology

## 2017-04-02 ENCOUNTER — Other Ambulatory Visit: Payer: Self-pay

## 2017-04-02 ENCOUNTER — Encounter: Payer: Self-pay | Admitting: Gastroenterology

## 2017-04-02 VITALS — BP 110/71 | HR 68 | Temp 97.8°F | Ht 68.0 in | Wt 282.0 lb

## 2017-04-02 DIAGNOSIS — K279 Peptic ulcer, site unspecified, unspecified as acute or chronic, without hemorrhage or perforation: Secondary | ICD-10-CM | POA: Insufficient documentation

## 2017-04-02 DIAGNOSIS — I70209 Unspecified atherosclerosis of native arteries of extremities, unspecified extremity: Secondary | ICD-10-CM | POA: Insufficient documentation

## 2017-04-02 DIAGNOSIS — M25476 Effusion, unspecified foot: Secondary | ICD-10-CM | POA: Diagnosis not present

## 2017-04-02 DIAGNOSIS — I864 Gastric varices: Secondary | ICD-10-CM | POA: Insufficient documentation

## 2017-04-02 DIAGNOSIS — M25475 Effusion, left foot: Secondary | ICD-10-CM

## 2017-04-02 DIAGNOSIS — K766 Portal hypertension: Secondary | ICD-10-CM | POA: Diagnosis not present

## 2017-04-02 DIAGNOSIS — K7469 Other cirrhosis of liver: Secondary | ICD-10-CM

## 2017-04-02 DIAGNOSIS — K3189 Other diseases of stomach and duodenum: Secondary | ICD-10-CM | POA: Diagnosis not present

## 2017-04-02 DIAGNOSIS — L98499 Non-pressure chronic ulcer of skin of other sites with unspecified severity: Secondary | ICD-10-CM

## 2017-04-02 DIAGNOSIS — M25473 Effusion, unspecified ankle: Secondary | ICD-10-CM | POA: Diagnosis not present

## 2017-04-02 LAB — CBC
HEMATOCRIT: 34.2 % — AB (ref 35.0–47.0)
HEMOGLOBIN: 11.9 g/dL — AB (ref 12.0–16.0)
MCH: 42.6 pg — AB (ref 26.0–34.0)
MCHC: 34.8 g/dL (ref 32.0–36.0)
MCV: 122.2 fL — AB (ref 80.0–100.0)
Platelets: 60 10*3/uL — ABNORMAL LOW (ref 150–440)
RBC: 2.8 MIL/uL — AB (ref 3.80–5.20)
RDW: 19.5 % — ABNORMAL HIGH (ref 11.5–14.5)
WBC: 4.9 10*3/uL (ref 3.6–11.0)

## 2017-04-02 LAB — COMPREHENSIVE METABOLIC PANEL
ALK PHOS: 161 U/L — AB (ref 38–126)
ALT: 30 U/L (ref 14–54)
AST: 49 U/L — AB (ref 15–41)
Albumin: 2.9 g/dL — ABNORMAL LOW (ref 3.5–5.0)
Anion gap: 7 (ref 5–15)
BILIRUBIN TOTAL: 5.3 mg/dL — AB (ref 0.3–1.2)
BUN: 16 mg/dL (ref 6–20)
CO2: 26 mmol/L (ref 22–32)
Calcium: 9.2 mg/dL (ref 8.9–10.3)
Chloride: 103 mmol/L (ref 101–111)
Creatinine, Ser: 1.07 mg/dL — ABNORMAL HIGH (ref 0.44–1.00)
GFR calc Af Amer: >60 mL/min (ref >60)
GFR calc non Af Amer: 54 mL/min — ABNORMAL LOW (ref >60)
GLUCOSE: 136 mg/dL — AB (ref 65–99)
POTASSIUM: 5 mmol/L (ref 3.5–5.1)
Sodium: 136 mmol/L (ref 135–145)
Total Protein: 6.7 g/dL (ref 6.5–8.1)

## 2017-04-02 LAB — MAGNESIUM: Magnesium: 1.7 mg/dL (ref 1.7–2.4)

## 2017-04-02 MED ORDER — FUROSEMIDE 40 MG PO TABS: 80.0000 mg | ORAL_TABLET | Freq: Every day | ORAL | 0 refills | Status: DC

## 2017-04-02 MED ORDER — SPIRONOLACTONE 100 MG PO TABS: 200.0000 mg | ORAL_TABLET | Freq: Every day | ORAL | 0 refills | Status: DC

## 2017-04-02 NOTE — Progress Notes (Signed)
Cephas Darby, MD [address]  [place]  [place],  [address]  Main: [phone]  Fax: [phone]    Gastroenterology Consultation  Referring Provider:     Roselee Nova, MD Primary Care Physician:  Roselee Nova, MD Primary Gastroenterologist:  Dr. Cephas Darby Reason for Consultation:    Cirrhosis        HPI:   Leah Shah is a 61 y.o. y/o female referred for consultation & management  by Dr. Manuella Ghazi, Talbert Forest, MD for recent diagnosis of cirrhosis of liver. She is morbidly obese, and has been gaining excessive weight secondary to progressive swelling of legs which started beginning of 2018 for which she was admitted to the hospital and diuresed with IV diuretics. She was discharged home on furosemide. She also has been experiencing a lot of mood changes and closely followed by Dr. Donneta Romberg. She is diagnosed with anxiety and depression and started her on antidepressants. She has not been able to get rid of fluid on current dose of diuretics. She consumes high sodium in her diet. She reports insomnia as she wakes up almost every hour during night since taking Lasix at bedtime. She is accompanied by her niece today. Her imaging studies suggest fatty liver and hepatomegaly from prior, and recently from ultrasound in July 2018 revealed nodular liver and portal hypertension. Therefore she is sent here for further management.  She otherwise denies fever, chills, nausea, vomiting, hematemesis, melena, rectal bleeding, shortness of breath, chest pain, headache, tingling or numbness. She reports easy bruising. She does report constipation which is relieved with stool softeners. She reports undergoing EGD several years ago but never had a colonoscopy.   First noted abnormality in LFT's in 06/2010 .  Alcohol use in the past, socially, quit 60yrs ago  Drug use none Over the counter herbal supplements Azo yeast supplements New medications n/a Abdominal pain lower abdomen  when constipated Tattoos yes, on left LE, lateral side of lower leg Military service none Prior blood transfusion none Incarceration none History of travel none Family history of liver disease none Recent change in weight gained from swelling of legs  Follow up visit 03/03/2017: She reports significant improvement in her abdominal distention, swelling of legs, her weight has decreased from 305lbs to 277.6lbs in 2 weeks. Her potassium and magnesium were low over the weekend, and a repeated them. Repeat labs came back normal. She is currently on furosemide 80 mg and spironolactone 200 mg daily she continues to have large urine output, up to 10 times a day. She is able to sleep better at night, less daytime somnolence. Her labs also revealed that she has elevated total bilirubin and predominantly indirect bilirubin. She denies abdominal pain, fever, chills, dark urine. She is adherent to low-salt diet. She is accompanied by her sister and daughter-in-law today. The workup for secondary liver disease showed that her ceruloplasmin levels are mildly low. Her AFP levels are elevated. She continues to smoke cigarettes.  Follow up visit 04/02/2017: Since her last follow-up, she underwent EGD and a colonoscopy. Results as mentioned below. Her H. pylori serologies were positive. Therefore, I started triple therapy given the the presence of duodenal ulcer. She has not started amoxicillin and clarithromycin both at the same time because there was daily for approval of clarithromycin from her insurance company. She reports taking Prilosec 2 times daily. With regards to her cirrhosis, she has been taking furosemide 60 mg and spironolactone 150 mg in  the morning. Her swelling of legs has worsened since I last saw her as she has been eating pickles. She also skipped doses on furosemide due to travel as her father-in-law passed away recently. She gained about 7 pounds since last visit. She has stopped smoking cigarettes  as her shortness of breath got worse. She is accompanied by her sister today. She has not undergone a liver biopsy yet itching ordered to evaluate for copper quantification as her 24-hour urine copper levels were elevated. She was seen by the [facility] and reportedly there was no evidence of KF ring. She also underwent MRI liver mass protocol and there was no evidence of hepatocellular carcinoma. She also received first dose of hepatitis B vaccine series. She otherwise denies any complaints today with regards to her chronic liver disease.   GI procedures: EGD 03/11/2017 - One non-bleeding duodenal ulcer with a clean ulcer base (Forrest Class III). - Portal hypertensive gastropathy. - Type 1 isolated gastric varices (IGV1, varices located in the fundus), without bleeding. - LA Grade A reflux esophagitis. - No specimens collected.  Colonoscopy 03/11/2017  - Non-thrombosed external hemorrhoids found on perianal exam. - Stool in the sigmoid colon. - One 6 mm polyp in the rectum, removed with a cold snare. Resected and retrieved. - The distal rectum and anal verge are normal on retroflexion view.  DIAGNOSIS:  A. RECTUM POLYP; COLD SNARE:  - TUBULOVILLOUS ADENOMA.  - NEGATIVE FOR HIGH-GRADE DYSPLASIA AND MALIGNANCY.   Past Medical History:  Diagnosis Date  . Anxiety   . Arthritis    R knee, L knee - OA  . Chronic diastolic CHF (congestive heart failure) ([place])    a. 06/2016 Echo: EF 60-65%, no rwma, mod MR, mildly dil LA, nl RV fxn, PASP 5mmHg.  [name] Dysrhythmia    "mild arrythmia after taking Redux diet med years ago"no cardio fi  . Headache(784.0)   . History of stress test    a. 06/2016 MV: EF 67%, no ischemia/infarct.  . Hyperlipidemia   . Hypertension    echoJefm Bryant, wnl 2009? , had taken Redux for diet management  but now  off the market. pt, told that echo wnl.    . Moderate mitral regurgitation    a. 06/2016 Echo: EF 60-65%, mod MR.  . Morbid obesity ([place])   .  Osteoarthritis   . Recurrent upper respiratory infection (URI)    treated /w OTC med.     Past Surgical History:  Procedure Laterality Date  . ABDOMINAL HYSTERECTOMY     partial-uterus removed  . CHOLECYSTECTOMY     2011  . COCCYX REMOVAL     [facility]  . COLONOSCOPY WITH PROPOFOL N/A 03/11/2017   Procedure: COLONOSCOPY WITH PROPOFOL;  Surgeon: Lin Landsman, MD;  Location: [facility] ENDOSCOPY;  Service: Gastroenterology;  Laterality: N/A;  . ESOPHAGOGASTRODUODENOSCOPY (EGD) WITH PROPOFOL N/A 03/11/2017   Procedure: ESOPHAGOGASTRODUODENOSCOPY (EGD) WITH PROPOFOL;  Surgeon: Lin Landsman, MD;  Location[facility] END[facility]COPY;  Service: Gastroenterology;  Laterality: N/A;  . EYE SURGERY     states both lens replaced. Not sure if cataract surgery or not.  [name] HEEL SPUR SURGERY    . I&D EXTREMITY  08/27/2011   Procedure: IRRIGATION AND DEBRIDEMENT EXTREMITY;  Surgeon: [name], MD;  Location: [place];  Service: Orthopedics;  Laterality: Right;  . JOINT REPLACEMENT Right 2012   Right TKR  . KNEE ARTHROPLASTY  07/28/2011   Procedure: COMPUTER ASSISTED TOTAL KNEE ARTHROPLASTY;  Surgeon: Tonna Corner  Marlou Sa, MD;  Location: [place];  Service: Orthopedics;  Laterality: Right;  right total knee arthroplasty, computer assisted  . KNEE ARTHROSCOPY     right knee  . TMJ ARTHROPLASTY     [place]    Prior to Admission medications   Medication Sig Start Date End Date Taking? Authorizing Provider  albuterol (PROVENTIL HFA;VENTOLIN HFA) 108 (90 Base) MCG/ACT inhaler Inhale 2 puffs into the lungs every 6 (six) hours as needed for wheezing or shortness of breath. 08/29/16   Loletha Grayer, MD  alprazolam Duanne Moron) 2 MG tablet Take 1 tablet (2 mg total) by mouth 2 (two) times daily as needed for anxiety. 02/04/17   Roselee Nova, MD  aspirin 81 MG EC tablet Take 1 tablet (81 mg total) by mouth daily. 08/30/16   Loletha Grayer, MD  atenolol (TENORMIN) 100 MG tablet TAKE 1 TABLET (100 MG TOTAL) BY  MOUTH DAILY. 12/28/16   Roselee Nova, MD  citalopram (CELEXA) 10 MG tablet Take 1 tablet (10 mg total) by mouth daily. 02/04/17   Roselee Nova, MD  cyclobenzaprine (FLEXERIL) 5 MG tablet TAKE ONE AND ONE-HALF TABLETS BY MOUTH EVERY NIGHT AT BEDTIME 02/09/17   Lada, Satira Anis, MD  furosemide (LASIX) 40 MG tablet Take 1 tablet (40 mg total) by mouth 2 (two) times daily. Take 60 mg in morning, 40 mg at night Patient taking differently: Take 40 mg by mouth daily. Take 60 mg in morning, 40 mg at night 10/12/16   Roselee Nova, MD  hydrOXYzine (ATARAX/VISTARIL) 25 MG tablet Take 1 tablet (25 mg total) by mouth every 8 (eight) hours as needed. 01/07/17   Roselee Nova, MD  losartan (COZAAR) 50 MG tablet TAKE 1 TABLET (50 MG TOTAL) BY MOUTH DAILY. 11/09/16   Roselee Nova, MD  oxyCODONE (OXY IR/ROXICODONE) 5 MG immediate release tablet Take 1 tablet (5 mg total) by mouth every 8 (eight) hours as needed for moderate pain. 02/04/17   Roselee Nova, MD  potassium chloride SA (K-DUR,KLOR-CON) 20 MEQ tablet Take 1 tablet (20 mEq total) by mouth 2 (two) times daily. Patient taking differently: Take 20 mEq by mouth once.  11/09/16   Roselee Nova, MD  simvastatin (ZOCOR) 40 MG tablet TAKE 1 TABLET (40 MG TOTAL) BY MOUTH DAILY AT 6 PM. 12/28/16   [provider]    Family History  Problem Relation Age of Onset  . Heart disease Mother   . Hypertension Mother   . Hypertension Father   . Heart disease Sister 29       stent   . Heart attack Sister   . Hyperlipidemia Sister   . Hypertension Sister   . Heart attack Maternal Uncle 64  . Heart disease Maternal Uncle        CABG     Social History  Substance Use Topics  . Smoking status: Current Every Day Smoker    Packs/day: 0.25    Years: 50.00    Types: Cigarettes  . Smokeless tobacco: Never Used  . Alcohol use No    Allergies as of 04/02/2017 - Review Complete 04/02/2017  Allergen Reaction Noted  .  Bacitracin-neomycin-polymyxin Other (See Comments) 03/14/2015  . Codeine Other (See Comments) 05/12/2011  . Gabapentin Nausea Only 12/13/2014  . Neosporin  [neomycin-bacitracin zn-polymyx]  12/13/2014  . Pregabalin Other (See Comments) 12/13/2014    Review of Systems:    All systems reviewed and negative  except where noted in HPI.   Physical Exam:  BP 110/71   Pulse 68   Temp 97.8 F (36.6 C) (Oral)   Ht 5\' 8"  (1.727 m)   Wt 282 lb (127.9 kg)   BMI 42.88 kg/m  No LMP recorded. Patient has had a hysterectomy. Psych:  Alert and cooperative. Normal mood and affect, obese. General:   Alert,  Well-developed, well-nourished, pleasant and cooperative in NAD Head:  Normocephalic and atraumatic. Eyes:  Sclera clear, positive icterus.   Conjunctiva pink. Ears:  Normal auditory acuity. Nose:  No deformity, discharge, or lesions Mouth:  No deformity or lesions,oropharynx pink & moist. Neck:  Supple; no masses or thyromegaly, short and thick, double chin Lungs:  Respirations even and unlabored.  Clear throughout to auscultation.   No wheezes, crackles, or rhonchi. No acute distress. Heart:  Regular rate and rhythm; no murmurs, clicks, rubs, or gallops. Abdomen:  Normal bowel sounds.  No bruits.  Soft, non-tender and non-distended without masses, hepatosplenomegaly or hernias noted.  No guarding or rebound tenderness, redundant skin in lower abdomen.    Msk:  Symmetrical without gross deformities. Good, equal movement & strength bilaterally. Pulses:  Normal pulses noted. Extremities:  No clubbing, 3+ edema in bilateral lower extremities, has 3+ pedal edema.  No cyanosis. Neurologic:  Alert and oriented x3;  grossly normal neurologically. Skin:  Intact without significant lesions, large purpurea in upper extremities Psych:  Alert and cooperative. Normal mood and affect.  Imaging Studies: Ultrasound abdomen right upper quadrant 01/08/2017 IMPRESSION: Diffuse increased echotexture of the  liver which can be fatty infiltration of liver. There is nodular contour of liver with hepatofugal flow in the portal plain suggesting changes of cirrhosis and portal hypertension.  MRI liver mass protocol 03/17/2017 IMPRESSION: 1. Morphologic changes of hepatic cirrhosis.  No liver masses. 2. Large splenorenal shunt. Focal gastric wall varices in the posterior gastric cardia region. 3. No ascites.  Top-normal size spleen. 4. Lobulated nonenhancing 1.9 x 0.9 x 0.9 cm posterior pancreatic head cystic lesion. No high risk MRI features. Follow-up MRI abdomen without and with IV contrast recommended in 6 months.  Assessment and Plan:   Leah Shah  61 y.o. 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 Ceruloplasmin levels are mildly low at 16.8, 24-hour urine copper revealed elevated copper levels Copper,Urine 24 Hr       474     [facility] ] ug/24 hr BN    Reference Range: 3-35                  There was no evidence of KF King per ophthalmologist Ultrasound-guided liver biopsy is scheduled for 04/06/17 to evaluate for copper quantification  Other secondary causes of liver disease revealed normal  iron & ferritin, Hepatitis total A Ab positive, Hepatitis B surface Ag negative, Hepatitis B surface Ab mildly positive,& Hepatitis C Ab negative,HIV negative, ANA negative, AMA & anti-SMA negative, LKM ab negative, Immunoglobulins normal, A-1 antitrypsin normal, celiac serology negative.   Portal hypertension:  Volume overload/Ascites: Hypervolemic, Worsened bilateral swelling of legs secondary to high sodium intake, pickles. Recommend to continue 2gm sodium diet and avoid pickles. I will increase furosemide to 80 mg in the morning and spironolactone to 200 mg in the morning.  She will continue oral potassium 40 mEq daily and magnesium daily.  I will check BMP and magnesium today and in 1 week.   PSE: Mild, but no evidence of asterixis. Continue lactulose with a goal to have 1 to 2 formed stools daily Anemia/Thrombocytopenia/Coagulopathy: Has severe portal hypertensive gastropathy and duodenal ulcer. 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 Mild coagulopathy. Counts are stable Varices: EGD 03/2017 revealed it to severe portal hypertensive gastropathy, isolated gastric varix 1. Repeat EGD in 1 year  HRS: Normal creatinine HCC screening: Up to date with ultrasound in July 2018, AFP elevated to 13.3. MRI liver mass protocol performed on 03/17/2017, no evidence of HCC. CT none   Health maintenance: Hepatitis A/B vaccine status: Immune to hepatitis A, received first dose of hepatitis B vaccine series From Dr. Audelia Hives office. Influenza vaccine: Received in 05/2016, recommend annual influenza vaccine  Pneumonia vaccine: Received PPSV-23 in 08/2016 Colonoscopy: subcentimeter tubulovillous adenoma in rectosigmoid, recommend colonoscopy in 3 years  Vitamin D levels: Very low at 7, replaced with 50,000 units weekly for 3 months and recheck levels in 3 months Abstinence from ETOH: Encouraged to stay away from alcohol, quit more than 10 years ago Avoid NSAIDs 2D Echo: Performed on 74/25/9563, mild diastolic dysfunction, mild MR, left atrium moderately dilated, PA pressure elevated Smoking:  She quit smoking at present Peptic ulcer disease: Clean based duodenal ulcer, H. pylori serology positive status post triple therapy  Reflux esophagitis: LA grade A, continue PPI twice a day  Evaluate for liver transplant: Pending above workup   Follow up in 4 weeks  Cephas Darby, MD

## 2017-04-03 ENCOUNTER — Emergency Department: Payer: Medicare HMO

## 2017-04-03 ENCOUNTER — Inpatient Hospital Stay
Admission: EM | Admit: 2017-04-03 | Discharge: 2017-04-07 | DRG: 291 | Disposition: A | Discharge status: Skilled Nursing Facility | Payer: Medicare HMO | Attending: Internal Medicine | Admitting: Internal Medicine

## 2017-04-03 ENCOUNTER — Encounter: Payer: Self-pay | Admitting: Intensive Care

## 2017-04-03 DIAGNOSIS — Z8249 Family history of ischemic heart disease and other diseases of the circulatory system: Secondary | ICD-10-CM

## 2017-04-03 DIAGNOSIS — M6281 Muscle weakness (generalized): Secondary | ICD-10-CM | POA: Diagnosis not present

## 2017-04-03 DIAGNOSIS — R278 Other lack of coordination: Secondary | ICD-10-CM | POA: Diagnosis not present

## 2017-04-03 DIAGNOSIS — Z90710 Acquired absence of both cervix and uterus: Secondary | ICD-10-CM | POA: Diagnosis not present

## 2017-04-03 DIAGNOSIS — N12 Tubulo-interstitial nephritis, not specified as acute or chronic: Secondary | ICD-10-CM

## 2017-04-03 DIAGNOSIS — Z9114 Patient's other noncompliance with medication regimen: Secondary | ICD-10-CM

## 2017-04-03 DIAGNOSIS — J432 Centrilobular emphysema: Secondary | ICD-10-CM | POA: Diagnosis present

## 2017-04-03 DIAGNOSIS — Z96651 Presence of right artificial knee joint: Secondary | ICD-10-CM | POA: Diagnosis present

## 2017-04-03 DIAGNOSIS — K7460 Unspecified cirrhosis of liver: Secondary | ICD-10-CM | POA: Diagnosis not present

## 2017-04-03 DIAGNOSIS — D6959 Other secondary thrombocytopenia: Secondary | ICD-10-CM | POA: Diagnosis present

## 2017-04-03 DIAGNOSIS — Z885 Allergy status to narcotic agent status: Secondary | ICD-10-CM

## 2017-04-03 DIAGNOSIS — M1990 Unspecified osteoarthritis, unspecified site: Secondary | ICD-10-CM | POA: Diagnosis present

## 2017-04-03 DIAGNOSIS — I110 Hypertensive heart disease with heart failure: Principal | ICD-10-CM | POA: Diagnosis present

## 2017-04-03 DIAGNOSIS — E785 Hyperlipidemia, unspecified: Secondary | ICD-10-CM | POA: Diagnosis present

## 2017-04-03 DIAGNOSIS — F419 Anxiety disorder, unspecified: Secondary | ICD-10-CM | POA: Diagnosis present

## 2017-04-03 DIAGNOSIS — Z9911 Dependence on respirator [ventilator] status: Secondary | ICD-10-CM | POA: Diagnosis not present

## 2017-04-03 DIAGNOSIS — D696 Thrombocytopenia, unspecified: Secondary | ICD-10-CM | POA: Diagnosis not present

## 2017-04-03 DIAGNOSIS — Z87891 Personal history of nicotine dependence: Secondary | ICD-10-CM | POA: Diagnosis not present

## 2017-04-03 DIAGNOSIS — R0602 Shortness of breath: Secondary | ICD-10-CM | POA: Diagnosis not present

## 2017-04-03 DIAGNOSIS — R109 Unspecified abdominal pain: Secondary | ICD-10-CM | POA: Diagnosis not present

## 2017-04-03 DIAGNOSIS — Z9111 Patient's noncompliance with dietary regimen: Secondary | ICD-10-CM

## 2017-04-03 DIAGNOSIS — Z9049 Acquired absence of other specified parts of digestive tract: Secondary | ICD-10-CM

## 2017-04-03 DIAGNOSIS — Z743 Need for continuous supervision: Secondary | ICD-10-CM | POA: Diagnosis not present

## 2017-04-03 DIAGNOSIS — R188 Other ascites: Secondary | ICD-10-CM | POA: Diagnosis not present

## 2017-04-03 DIAGNOSIS — Z7982 Long term (current) use of aspirin: Secondary | ICD-10-CM

## 2017-04-03 DIAGNOSIS — I5032 Chronic diastolic (congestive) heart failure: Secondary | ICD-10-CM | POA: Diagnosis not present

## 2017-04-03 DIAGNOSIS — J810 Acute pulmonary edema: Secondary | ICD-10-CM

## 2017-04-03 DIAGNOSIS — E875 Hyperkalemia: Secondary | ICD-10-CM | POA: Diagnosis not present

## 2017-04-03 DIAGNOSIS — Z881 Allergy status to other antibiotic agents status: Secondary | ICD-10-CM

## 2017-04-03 DIAGNOSIS — Z8349 Family history of other endocrine, nutritional and metabolic diseases: Secondary | ICD-10-CM

## 2017-04-03 DIAGNOSIS — K3189 Other diseases of stomach and duodenum: Secondary | ICD-10-CM | POA: Diagnosis present

## 2017-04-03 DIAGNOSIS — R100 Acute abdomen: Secondary | ICD-10-CM | POA: Diagnosis not present

## 2017-04-03 DIAGNOSIS — R0600 Dyspnea, unspecified: Secondary | ICD-10-CM | POA: Diagnosis not present

## 2017-04-03 DIAGNOSIS — Z6841 Body Mass Index (BMI) 40.0 and over, adult: Secondary | ICD-10-CM | POA: Diagnosis not present

## 2017-04-03 DIAGNOSIS — J80 Acute respiratory distress syndrome: Secondary | ICD-10-CM | POA: Diagnosis not present

## 2017-04-03 DIAGNOSIS — J9601 Acute respiratory failure with hypoxia: Secondary | ICD-10-CM | POA: Diagnosis not present

## 2017-04-03 DIAGNOSIS — Z888 Allergy status to other drugs, medicaments and biological substances status: Secondary | ICD-10-CM

## 2017-04-03 DIAGNOSIS — K766 Portal hypertension: Secondary | ICD-10-CM | POA: Diagnosis present

## 2017-04-03 DIAGNOSIS — I5033 Acute on chronic diastolic (congestive) heart failure: Secondary | ICD-10-CM | POA: Diagnosis not present

## 2017-04-03 DIAGNOSIS — R9431 Abnormal electrocardiogram [ECG] [EKG]: Secondary | ICD-10-CM | POA: Diagnosis not present

## 2017-04-03 DIAGNOSIS — I4891 Unspecified atrial fibrillation: Secondary | ICD-10-CM | POA: Diagnosis present

## 2017-04-03 DIAGNOSIS — Z833 Family history of diabetes mellitus: Secondary | ICD-10-CM

## 2017-04-03 DIAGNOSIS — J811 Chronic pulmonary edema: Secondary | ICD-10-CM | POA: Diagnosis not present

## 2017-04-03 DIAGNOSIS — R161 Splenomegaly, not elsewhere classified: Secondary | ICD-10-CM | POA: Diagnosis present

## 2017-04-03 DIAGNOSIS — N390 Urinary tract infection, site not specified: Secondary | ICD-10-CM | POA: Diagnosis present

## 2017-04-03 DIAGNOSIS — M545 Low back pain: Secondary | ICD-10-CM

## 2017-04-03 DIAGNOSIS — M549 Dorsalgia, unspecified: Secondary | ICD-10-CM | POA: Diagnosis not present

## 2017-04-03 DIAGNOSIS — R488 Other symbolic dysfunctions: Secondary | ICD-10-CM | POA: Diagnosis not present

## 2017-04-03 DIAGNOSIS — G8929 Other chronic pain: Secondary | ICD-10-CM

## 2017-04-03 HISTORY — DX: Unspecified cirrhosis of liver: K74.60

## 2017-04-03 LAB — COMPREHENSIVE METABOLIC PANEL
ALBUMIN: 2.7 g/dL — AB (ref 3.5–5.0)
ALK PHOS: 139 U/L — AB (ref 38–126)
ALT: 30 U/L (ref 14–54)
ANION GAP: 8 (ref 5–15)
AST: 55 U/L — AB (ref 15–41)
BILIRUBIN TOTAL: 6.4 mg/dL — AB (ref 0.3–1.2)
BUN: 20 mg/dL (ref 6–20)
CALCIUM: 8.7 mg/dL — AB (ref 8.9–10.3)
CO2: 24 mmol/L (ref 22–32)
CREATININE: 0.92 mg/dL (ref 0.44–1.00)
Chloride: 104 mmol/L (ref 101–111)
GFR calc Af Amer: >60 mL/min (ref >60)
GFR calc non Af Amer: >60 mL/min (ref >60)
GLUCOSE: 152 mg/dL — AB (ref 65–99)
Potassium: 5.5 mmol/L — ABNORMAL HIGH (ref 3.5–5.1)
SODIUM: 136 mmol/L (ref 135–145)
TOTAL PROTEIN: 6.4 g/dL — AB (ref 6.5–8.1)

## 2017-04-03 LAB — URINALYSIS, COMPLETE (UACMP) WITH MICROSCOPIC
GLUCOSE, UA: NEGATIVE mg/dL
Hgb urine dipstick: NEGATIVE
Ketones, ur: 5 mg/dL — AB
Nitrite: NEGATIVE
PH: 5 (ref 5.0–8.0)
Protein, ur: 100 mg/dL — AB
SPECIFIC GRAVITY, URINE: 1.028 (ref 1.005–1.030)

## 2017-04-03 LAB — LIPASE, BLOOD: Lipase: 33 U/L (ref 11–51)

## 2017-04-03 LAB — AMMONIA: Ammonia: 51 umol/L — ABNORMAL HIGH (ref 9–35)

## 2017-04-03 LAB — TROPONIN I

## 2017-04-03 LAB — CBC
HCT: 35.2 % (ref 35.0–47.0)
HEMOGLOBIN: 12.7 g/dL (ref 12.0–16.0)
MCH: 42.2 pg — AB (ref 26.0–34.0)
MCHC: 36 g/dL (ref 32.0–36.0)
MCV: 117.3 fL — AB (ref 80.0–100.0)
PLATELETS: 105 10*3/uL — AB (ref 150–440)
RBC: 3.01 MIL/uL — AB (ref 3.80–5.20)
RDW: 20.4 % — ABNORMAL HIGH (ref 11.5–14.5)
WBC: 5.9 10*3/uL (ref 3.6–11.0)

## 2017-04-03 MED ORDER — CEFTRIAXONE SODIUM IN DEXTROSE 20 MG/ML IV SOLN
1.0000 g | Freq: Once | INTRAVENOUS | Status: AC
  Administered 2017-04-03: 1 g via INTRAVENOUS
  Filled 2017-04-03: qty 50

## 2017-04-03 MED ORDER — FLUCONAZOLE 50 MG PO TABS
150.0000 mg | ORAL_TABLET | Freq: Once | ORAL | Status: AC
  Administered 2017-04-03: 18:00:00 150 mg via ORAL
  Filled 2017-04-03: qty 1

## 2017-04-03 MED ORDER — ONDANSETRON HCL 4 MG PO TABS
4.0000 mg | ORAL_TABLET | Freq: Four times a day (QID) | ORAL | Status: DC | PRN
  Administered 2017-04-05: 4 mg via ORAL
  Filled 2017-04-03: qty 1

## 2017-04-03 MED ORDER — SODIUM POLYSTYRENE SULFONATE 15 GM/60ML PO SUSP
30.0000 g | Freq: Once | ORAL | Status: DC
  Filled 2017-04-03: qty 120

## 2017-04-03 MED ORDER — IOPAMIDOL (ISOVUE-370) INJECTION 76%
100.0000 mL | Freq: Once | INTRAVENOUS | Status: AC | PRN
  Administered 2017-04-03: 100 mL via INTRAVENOUS

## 2017-04-03 MED ORDER — ONDANSETRON HCL 4 MG/2ML IJ SOLN: 4.0000 mg | Freq: Four times a day (QID) | INTRAMUSCULAR | Status: DC | PRN

## 2017-04-03 MED ORDER — ONDANSETRON HCL 4 MG/2ML IJ SOLN
4.0000 mg | Freq: Once | INTRAMUSCULAR | Status: AC
  Administered 2017-04-03: 4 mg via INTRAVENOUS
  Filled 2017-04-03: qty 2

## 2017-04-03 MED ORDER — ALBUTEROL SULFATE (2.5 MG/3ML) 0.083% IN NEBU
2.5000 mg | INHALATION_SOLUTION | Freq: Four times a day (QID) | RESPIRATORY_TRACT | Status: DC | PRN
  Administered 2017-04-05: 2.5 mg via RESPIRATORY_TRACT

## 2017-04-03 MED ORDER — FUROSEMIDE 10 MG/ML IJ SOLN
20.0000 mg | Freq: Once | INTRAMUSCULAR | Status: AC
  Administered 2017-04-03: 20 mg via INTRAVENOUS
  Filled 2017-04-03: qty 4

## 2017-04-03 MED ORDER — ASPIRIN EC 81 MG PO TBEC
81.0000 mg | DELAYED_RELEASE_TABLET | Freq: Every day | ORAL | Status: DC
  Administered 2017-04-04 – 2017-04-07 (×4): 81 mg via ORAL
  Filled 2017-04-03 (×4): qty 1

## 2017-04-03 MED ORDER — SPIRONOLACTONE 25 MG PO TABS: 200.0000 mg | ORAL_TABLET | Freq: Every day | ORAL | Status: DC

## 2017-04-03 MED ORDER — MORPHINE SULFATE (PF) 2 MG/ML IV SOLN
2.0000 mg | Freq: Once | INTRAVENOUS | Status: AC
  Administered 2017-04-03: 2 mg via INTRAVENOUS
  Filled 2017-04-03: qty 1

## 2017-04-03 MED ORDER — ALPRAZOLAM 1 MG PO TABS: 2.0000 mg | ORAL_TABLET | Freq: Two times a day (BID) | ORAL | Status: DC | PRN

## 2017-04-03 MED ORDER — DEXTROSE 5 % IV SOLN
1.0000 g | INTRAVENOUS | Status: DC
  Administered 2017-04-04: 1 g via INTRAVENOUS
  Filled 2017-04-03 (×2): qty 10

## 2017-04-03 MED ORDER — PANTOPRAZOLE SODIUM 40 MG PO TBEC
40.0000 mg | DELAYED_RELEASE_TABLET | Freq: Every day | ORAL | Status: DC
  Administered 2017-04-04 – 2017-04-07 (×4): 40 mg via ORAL
  Filled 2017-04-03 (×4): qty 1

## 2017-04-03 MED ORDER — FUROSEMIDE 10 MG/ML IJ SOLN
40.0000 mg | Freq: Two times a day (BID) | INTRAMUSCULAR | Status: DC
  Administered 2017-04-03 – 2017-04-06 (×4): 40 mg via INTRAVENOUS
  Filled 2017-04-03 (×4): qty 4

## 2017-04-03 MED ORDER — MORPHINE SULFATE (PF) 2 MG/ML IV SOLN: 1.0000 mg | INTRAVENOUS | Status: DC | PRN

## 2017-04-03 MED ORDER — ALBUTEROL SULFATE (2.5 MG/3ML) 0.083% IN NEBU
2.5000 mg | INHALATION_SOLUTION | Freq: Four times a day (QID) | RESPIRATORY_TRACT | Status: DC | PRN
  Filled 2017-04-03: qty 3

## 2017-04-03 MED ORDER — OXYCODONE HCL 5 MG PO TABS: 5.0000 mg | ORAL_TABLET | Freq: Three times a day (TID) | ORAL | Status: DC | PRN

## 2017-04-03 MED ORDER — MAGNESIUM OXIDE 400 (241.3 MG) MG PO TABS
400.0000 mg | ORAL_TABLET | Freq: Two times a day (BID) | ORAL | Status: DC
  Administered 2017-04-03 – 2017-04-07 (×8): 400 mg via ORAL
  Filled 2017-04-03 (×8): qty 1

## 2017-04-03 MED ORDER — IOPAMIDOL (ISOVUE-300) INJECTION 61%
30.0000 mL | Freq: Once | INTRAVENOUS | Status: AC | PRN
  Administered 2017-04-03: 30 mL via ORAL

## 2017-04-03 MED ORDER — ATENOLOL 100 MG PO TABS
100.0000 mg | ORAL_TABLET | Freq: Every day | ORAL | Status: DC
  Filled 2017-04-03: qty 1

## 2017-04-03 MED ORDER — OXYCODONE HCL 5 MG PO TABS
5.0000 mg | ORAL_TABLET | ORAL | Status: DC | PRN
  Administered 2017-04-03 – 2017-04-06 (×9): 5 mg via ORAL
  Filled 2017-04-03 (×9): qty 1

## 2017-04-03 MED ORDER — SODIUM POLYSTYRENE SULFONATE 15 GM/60ML PO SUSP
30.0000 g | Freq: Once | ORAL | Status: AC
  Administered 2017-04-03: 30 g via ORAL
  Filled 2017-04-03: qty 120

## 2017-04-03 MED ORDER — CITALOPRAM HYDROBROMIDE 20 MG PO TABS
10.0000 mg | ORAL_TABLET | Freq: Every day | ORAL | Status: DC
  Administered 2017-04-04 – 2017-04-07 (×4): 10 mg via ORAL
  Filled 2017-04-03 (×4): qty 1

## 2017-04-03 MED ORDER — POLYETHYLENE GLYCOL 3350 17 G PO PACK: 17.0000 g | PACK | Freq: Every day | ORAL | Status: DC | PRN

## 2017-04-03 MED ORDER — HYDROXYZINE HCL 25 MG PO TABS
25.0000 mg | ORAL_TABLET | Freq: Four times a day (QID) | ORAL | Status: DC | PRN
  Administered 2017-04-03 – 2017-04-05 (×5): 25 mg via ORAL
  Filled 2017-04-03 (×6): qty 1

## 2017-04-03 NOTE — ED Triage Notes (Signed)
Patient arrived by EMS due to Right sided flank pain that started last night. Was seen by GI yesterday at [facility] and had blood drawn. HX cirrhosis. A&O x4.

## 2017-04-03 NOTE — H&P (Signed)
[facility] [place] NAME: Leah Shah    MR#:  [id number]  DATE OF BIRTH:  1956-05-31  DATE OF ADMISSION:  04/03/2017  PRIMARY CARE PHYSICIAN: Roselee Nova, MD   REQUESTING/REFERRING PHYSICIAN: DR Cherylann Banas  CHIEF COMPLAINT:  Right-sided flank pain since yesterday  HISTORY OF PRESENT ILLNESS:  Leah Shah  is a 61 y.o. female with a known history of cirrhosis of liver with portal hypertension and thrombocytopenia, chronic diastolic congestive heart failure, arthritis, anxiety comes to the emergency room accompanied by daughter and son with complaints of right flank pain. According to the daughter patient had reddish tinge to her urine. She denies any history of kidney stone. In the emergency room patient was found to have UTI received IV Rocephin. Patient has history of cirrhosis of liver with ascites and recently has been noncompliant with her Lasix. She was seen by Dr. Marius Ditch GI who increased her Lasix to 80 mg daily and spironolactone 200 mg daily. Patient has been noncompliant with her diet and consumes a lot of salt. She has had some weight gain and appears to be edematous as well. She was found to be hypoxic in the 80s and \\sats  95 on2 L nasal canal oxygen.  Patient is being admitted with UTI and acute on chronic diastolic congestive heart failure/volume overload secondary to noncompliance with medications.  PAST MEDICAL HISTORY:   Past Medical History:  Diagnosis Date  . Anxiety   . Arthritis    R knee, L knee - OA  . Chronic diastolic CHF (congestive heart failure) ([place])    a. 06/2016 Echo: EF 60-65%, no rwma, mod MR, mildly dil LA, nl RV fxn, PASP 44mmHg.  [name] Cirrhosis ([place])   . Dysrhythmia    "mild arrythmia after taking Redux diet med years ago"no cardio fi  . Headache(784.0)   . History of stress test    a. 06/2016 MV: EF 67%, no ischemia/infarct.  . Hyperlipidemia   . Hypertension    echoJefm Bryant, wnl 2009? ,  had taken Redux for diet management  but now  off the market. pt, told that echo wnl.    . Moderate mitral regurgitation    a. 06/2016 Echo: EF 60-65%, mod MR.  . Morbid obesity ([place])   . Osteoarthritis   . Recurrent upper respiratory infection (URI)    treated /w OTC med.     PAST SURGICAL HISTOIRY:   Past Surgical History:  Procedure Laterality Date  . ABDOMINAL HYSTERECTOMY     partial-uterus removed  . CHOLECYSTECTOMY     2011  . COCCYX REMOVAL     [facility]  . COLONOSCOPY WITH PROPOFOL N/A 03/11/2017   Procedure: COLONOSCOPY WITH PROPOFOL;  Surgeon: Lin Landsman, MD;  Location[facility];  Service: Gastroenterology;  Laterality: N/A;  . ESOPHAGOGASTRODUODENOSCOPY (EGD) WITH PROPOFOL N/A 03/11/2017   Procedure: ESOPHAGOGASTRODUODENOSCOPY (EGD) WITH PROPOFOL;  Surgeon: Lin Landsman, MD;  Location: [facility] ENDOSCOPY;  Service: Gastroenterology;  Laterality: N/A;  . EYE SURGERY     states both lens replaced. Not sure if cataract surgery or not.  [name] HEEL SPUR SURGERY    . I&D EXTREMITY  08/27/2011   Procedure: IRRIGATION AND DEBRIDEMENT EXTREMITY;  Surgeon: [name], MD;  Location: [place];  Service: Orthopedics;  Laterality: Right;  . JOINT REPLACEMENT Right 2012   Right TKR  . KNEE ARTHROPLASTY  07/28/2011   Procedure: COMPUTER ASSISTED TOTAL KNEE ARTHROPLASTY;  Surgeon: Tonna Corner  Marlou Sa, MD;  Location: [place];  Service: Orthopedics;  Laterality: Right;  right total knee arthroplasty, computer assisted  . KNEE ARTHROSCOPY     right knee  . TMJ ARTHROPLASTY     [facility]- 1997    SOCIAL HISTORY:   Social History  Substance Use Topics  . Smoking status: Former Smoker    Packs/day: 0.25    Years: 50.00    Types: Cigarettes    Quit date: 03/20/2017  . Smokeless tobacco: Never Used  . Alcohol use No    FAMILY HISTORY:   Family History  Problem Relation Age of Onset  . Heart disease Mother   . Hypertension Mother   . Hypertension Father   . Heart  disease Sister 94       stent   . Heart attack Sister   . Hyperlipidemia Sister   . Hypertension Sister   . Heart attack Maternal Uncle 64  . Heart disease Maternal Uncle        CABG    DRUG ALLERGIES:   Allergies  Allergen Reactions  . Bacitracin-Neomycin-Polymyxin Other (See Comments)  . Codeine Other (See Comments)    Itching "she can take some forms of it" Itching "she can take some forms of it"  . Gabapentin Nausea Only  . Neosporin  [Neomycin-Bacitracin Zn-Polymyx]   . Pregabalin Other (See Comments)    stomach issues stomach issues    REVIEW OF SYSTEMS:  Review of Systems  Constitutional: Negative for chills, fever and weight loss.  HENT: Negative for ear discharge, ear pain and nosebleeds.   Eyes: Negative for blurred vision, pain and discharge.  Respiratory: Negative for sputum production, shortness of breath, wheezing and stridor.   Cardiovascular: Positive for orthopnea, leg swelling and PND. Negative for chest pain and palpitations.  Gastrointestinal: Negative for abdominal pain, diarrhea, nausea and vomiting.  Genitourinary: Negative for frequency and urgency.  Musculoskeletal: Negative for back pain and joint pain.  Neurological: Positive for weakness. Negative for sensory change, speech change and focal weakness.  Psychiatric/Behavioral: Negative for depression and hallucinations. The patient is not nervous/anxious.      MEDICATIONS AT HOME:   Prior to Admission medications   Medication Sig Start Date End Date Taking? Authorizing Provider  albuterol (PROVENTIL HFA;VENTOLIN HFA) 108 (90 Base) MCG/ACT inhaler Inhale 2 puffs into the lungs every 6 (six) hours as needed for wheezing or shortness of breath. 08/29/16   Loletha Grayer, MD  alprazolam Duanne Moron) 2 MG tablet Take 1 tablet (2 mg total) by mouth 2 (two) times daily as needed for anxiety. 03/04/17   Roselee Nova, MD  amoxicillin (AMOXIL) 500 MG capsule  03/12/17   [provider]  aspirin 81  MG EC tablet Take 1 tablet (81 mg total) by mouth daily. 08/30/16   Loletha Grayer, MD  atenolol (TENORMIN) 100 MG tablet TAKE 1 TABLET (100 MG TOTAL) BY MOUTH DAILY. 03/29/17   Roselee Nova, MD  citalopram (CELEXA) 10 MG tablet Take 1 tablet (10 mg total) by mouth daily. 02/04/17   Roselee Nova, MD  cyclobenzaprine (FLEXERIL) 5 MG tablet TAKE ONE AND ONE-HALF TABLETS BY MOUTH EVERY NIGHT AT BEDTIME 03/04/17   Roselee Nova, MD  fluconazole (DIFLUCAN) 200 MG tablet  03/18/17   [provider]  furosemide (LASIX) 40 MG tablet Take 2 tablets (80 mg total) by mouth daily. In the morning 04/02/17 07/01/17  Lin Landsman, MD  hydrOXYzine (ATARAX/VISTARIL) 25 MG tablet Take 1  tablet (25 mg total) by mouth every 8 (eight) hours as needed. 01/07/17   Roselee Nova, MD  Magnesium Oxide 400 (240 Mg) MG TABS TAKE 1 TABLET BY MOUTH TWICE A DAY 03/30/17   Lin Landsman, MD  omeprazole (PRILOSEC) 40 MG capsule Take 1 capsule (40 mg total) by mouth 2 (two) times daily before a meal. 03/11/17 06/09/17  Vanga, Tally Due, MD  oxyCODONE (OXY IR/ROXICODONE) 5 MG immediate release tablet Take 1 tablet (5 mg total) by mouth every 8 (eight) hours as needed for moderate pain. 03/04/17   Roselee Nova, MD  potassium chloride SA (K-DUR,KLOR-CON) 20 MEQ tablet  11/09/16   [provider]  spironolactone (ALDACTONE) 100 MG tablet Take 2 tablets (200 mg total) by mouth daily. 04/02/17 07/01/17  Lin Landsman, MD  Vitamin D, Ergocalciferol, (DRISDOL) 50000 units CAPS capsule Take 1 capsule (50,000 Units total) by mouth every 7 (seven) days. 03/03/17 06/17/17  Lin Landsman, MD      VITAL SIGNS:  Blood pressure (!) 83/59, pulse 76, temperature 98.1 F (36.7 C), temperature source Oral, resp. rate 20, height 5\' 8"  (1.727 m), weight 127.9 kg (282 lb), SpO2 (!) 85 %.  PHYSICAL EXAMINATION:  GENERAL:  61 y.o.-year-old patient lying in the bed with no acute distress. obese EYES:  Pupils equal, round, reactive to light and accommodation. No scleral icterus. Extraocular muscles intact.  HEENT: Head atraumatic, normocephalic. Oropharynx and nasopharynx clear.  NECK:  Supple, no jugular venous distention. No thyroid enlargement, no tenderness.  LUNGS: decreased breath sounds bilaterally, no wheezing, rales,rhonchi or crepitation. No use of accessory muscles of respiration.  CARDIOVASCULAR: S1, S2 normal. No murmurs, rubs, or gallops.  ABDOMEN: Soft, diffuselytender, nondistended. Bowel sounds present. No organomegaly or mass.  EXTREMITIES: +++ pedal edema, no cyanosis, or clubbing.  NEUROLOGIC: Cranial nerves II through XII are intact. Muscle strength 5/5 in all extremities. Sensation intact. Gait not checked.  PSYCHIATRIC: The patient is alert and oriented x 3.  SKIN: No obvious rash, lesion, or ulcer.  Bruising plus LABORATORY PANEL:   CBC  Recent Labs Lab 04/03/17 1309  WBC 5.9  HGB 12.7  HCT 35.2  PLT 105*   ------------------------------------------------------------------------------------------------------------------  Chemistries   Recent Labs Lab 04/02/17 1220 04/03/17 1413  NA 136 136  K 5.0 5.5*  CL 103 104  CO2 26 24  GLUCOSE 136* 152*  BUN 16 20  CREATININE 1.07* 0.92  CALCIUM 9.2 8.7*  MG 1.7  --   AST 49* 55*  ALT 30 30  ALKPHOS 161* 139*  BILITOT 5.3* 6.4*   ------------------------------------------------------------------------------------------------------------------  Cardiac Enzymes  Recent Labs Lab 04/03/17 1413  TROPONINI <0.03   ------------------------------------------------------------------------------------------------------------------  RADIOLOGY:  Dg Chest 2 View  Result Date: 04/03/2017 CLINICAL DATA:  Acute shortness of breath for 1 day. EXAM: CHEST  2 VIEW COMPARISON:  08/27/2016 FINDINGS: The cardiomediastinal silhouette is unremarkable. Increasing interstitial opacities likely representing edema noted.  No pleural effusion or pneumothorax. No acute bony abnormalities are present. IMPRESSION: Increasing interstitial opacities likely representing interstitial edema. Electronically Signed   By: Margarette Canada M.D.   On: 04/03/2017 15:13   Ct Abdomen Pelvis W Contrast  Result Date: 04/03/2017 CLINICAL DATA:  Right lower quadrant abdominal pain and flank pain. EXAM: CT ABDOMEN AND PELVIS WITH CONTRAST TECHNIQUE: Multidetector CT imaging of the abdomen and pelvis was performed using the standard protocol following bolus administration of intravenous contrast. CONTRAST:  100 cc of Isovue-300 COMPARISON:  03/17/2017 FINDINGS:  Lower chest: No acute abnormality. Hepatobiliary: Previous cholecystectomy. Mild increase caliber of the common bile duct which measures 9 mm. No intrahepatic bile duct dilatation. Pancreas: Unremarkable. No pancreatic ductal dilatation or surrounding inflammatory changes. Spleen: The spleen is enlarged measuring 14.5 x 10.9 x 5.8 cm (volume = 480 cm^3). Adrenals/Urinary Tract: Normal appearance of the adrenal glands. The kidneys are unremarkable. No mass or hydronephrosis identified. The urinary bladder appears normal. Stomach/Bowel: Stomach is normal. Small bowel loops are mildly prominent measuring up to 3.2 cm. The appendix is visualized and appears normal. There is mild wall thickening involving the cecum and ascending colon. Nonspecific in the setting of cirrhosis and portal venous hypertension with ascites. Vascular/Lymphatic: Aortic atherosclerosis. No aneurysm. 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 Prominent upper abdominal lymph nodes are nonspecific in the setting of cirrhosis and portal venous hypertension. No pelvic or inguinal adenopathy. Reproductive: Status post hysterectomy. No adnexal masses. Other: There is a small volume of ascites within the abdomen and pelvis. No focal fluid collections identified.  Musculoskeletal: No aggressive lytic or sclerotic bone lesions. Multi level degenerative disc disease noted. IMPRESSION: 1. Morphologic features liver compatible with cirrhosis. 2. Stigmata of portal venous hypertension including: Splenomegaly, varicosities, and abdominal ascites. 3.  Aortic Atherosclerosis (ICD10-I70.0). Electronically Signed   By: Kerby Moors M.D.   On: 04/03/2017 16:35    EKG:    IMPRESSION AND PLAN:    Leah Shah  is a 61 y.o. female with a known history of cirrhosis of liver with portal hypertension and thrombocytopenia, chronic diastolic congestive heart failure, arthritis, anxiety comes to the emergency room accompanied by daughter and son with complaints of right flank pain. According to the daughter patient had reddish tinge to her urine. She denies any history of kidney stone.   1. Right-sided flank pain withabnormal UA suggestive of acute cystitis -IV Rocephin -Follow up urine culture -CT abdomen negative for nephrolithiasis  2. Acute hypoxic respiratory failure secondary to acute on chronic test congestive heart failure/volume overload due to medication noncompliance -Patient sats were in the upper 80s on arrival to the ER. Currently 95% on 2 L -IV Lasix 40 twice a day monitor I's and O's -I will hold off on spironolactone given elevated K. Once potassium normalizes resume spironolactone 200 mg daily. This was the dose suggested by patient's GI  3. 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 Marius Ditch -Continue Lasix IV 40 twice a day -Resumes for electron once potassium normalizes  4. Portal hypertensive gastropathy and splenomegaly with thrombocytopenia secondary to #3 -Symptomatic management  5.morbid obesity  6. DVT prophylaxis SCDs and teds  Above was discussed with patient's daughter and son were present in the ED   All the records are reviewed and case discussed with ED provider. Management  plans discussed with the patient, family and they are in agreement.  CODE STATUS: full  TOTAL TIME TAKING CARE OF THIS PATIENT: *50* minutes.    Aryaan Persichetti M.D on 04/03/2017 at 6:49 PM  Between 7am to 6pm - Pager - [phone]  After 6pm go to [email or web address] - password [email or web address] [facility]  [facility]  Office  [phone]  CC: Primary care physician; Roselee Nova, MD

## 2017-04-03 NOTE — Progress Notes (Signed)
Pharmacy Antibiotic Note  Leah Shah is a 61 y.o. female admitted on 04/03/2017 with UTI.  Pharmacy has been consulted for Ceftriaxone dosing.  Plan: Ceftriaxone 1g IV q24h  Height: 5\' 8"  (172.7 cm) Weight: 282 lb (127.9 kg) IBW/kg (Calculated) : 63.9  Temp (24hrs), Avg:98.1 F (36.7 C), Min:98.1 F (36.7 C), Max:98.1 F (36.7 C)   Recent Labs Lab 04/02/17 1220 04/03/17 1309 04/03/17 1413  WBC 4.9 5.9  --   CREATININE 1.07*  --  0.92    Estimated Creatinine Clearance: 89.6 mL/min (by C-G formula based on SCr of 0.92 mg/dL).    Allergies  Allergen Reactions  . Bacitracin-Neomycin-Polymyxin Other (See Comments)  . Codeine Other (See Comments)    Itching "she can take some forms of it" Itching "she can take some forms of it"  . Gabapentin Nausea Only  . Neosporin  [Neomycin-Bacitracin Zn-Polymyx]   . Pregabalin Other (See Comments)    stomach issues stomach issues    Antimicrobials this admission: Ceftriaxone 9/29 >>   Thank you for allowing pharmacy to be a part of this patient's care.  Paulina Fusi, PharmD, BCPS 04/03/2017 7:56 PM

## 2017-04-03 NOTE — ED Provider Notes (Signed)
[facility] Emergency Department Provider Note ____________________________________________   First MD Initiated Contact with Patient 04/03/17 1334     (approximate)  I have reviewed the triage vital signs and the nursing notes.   HISTORY  Chief Complaint Flank Pain    HPI Leah Shah is a 61 y.o. female With a history of hypertension, diastolic CHF, liver cirrhosis, who presents with right mid abdominal pain, relatively acute onset last night, constant since then, and associated with nausea but no vomiting. Patient denies associated fever or chills, diarrhea, or urinary symptoms. No significant abdominal distention. No prior history of this specific pain. Patient also reports shortness of breath over the last several days.  Past Medical History:  Diagnosis Date  . Anxiety   . Arthritis    R knee, L knee - OA  . Chronic diastolic CHF (congestive heart failure) ([place])    a. 06/2016 Echo: EF 60-65%, no rwma, mod MR, mildly dil LA, nl RV fxn, PASP 56mmHg.  [name] Cirrhosis ([place])   . Dysrhythmia    "mild arrythmia after taking Redux diet med years ago"no cardio fi  . Headache(784.0)   . History of stress test    a. 06/2016 MV: EF 67%, no ischemia/infarct.  . Hyperlipidemia   . Hypertension    echoJefm Bryant, wnl 2009? , had taken Redux for diet management  but now  off the market. pt, told that echo wnl.    . Moderate mitral regurgitation    a. 06/2016 Echo: EF 60-65%, mod MR.  . Morbid obesity ([place])   . Osteoarthritis   . Recurrent upper respiratory infection (URI)    treated /w OTC med.     Patient Active Problem List   Diagnosis Date Noted  . UTI (urinary tract infection) 04/03/2017  . PUD (peptic ulcer disease) 04/02/2017  . 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 Portal hypertensive gastropathy ([place]) 04/02/2017  . Abnormal ECG 02/16/2017  . Edema extremities 02/16/2017  . Family history of diabetes mellitus 02/16/2017  . Melanocytic  nevus of skin 02/16/2017  . Spasm 02/16/2017  . Screening for thyroid disorder 02/16/2017  . Flu vaccine need 02/16/2017  . Hyperbilirubinemia 01/11/2017  . Diffuse nodular cirrhosis of liver ([place]) 01/11/2017  . Alkaline phosphatase elevation 01/11/2017  . COPD exacerbation ([place]) 09/08/2016  . Centrilobular emphysema ([place])   . Chronic diastolic CHF (congestive heart failure) ([place])   . Extreme obesity   . Hypertensive heart disease with heart failure ([place])   . Atrial fibrillation ([place]) 08/27/2016  . Congestive heart failure (CHF) ([place]) 08/27/2016  . Respiratory crackles at right lung base 04/29/2016  . Acute confusion following injury ([place]) 02/24/2016  . Hyperglycemia 12/12/2015  . Bilateral swelling of feet and ankles 11/13/2015  . 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 Dyslipidemia 06/16/2015  . MI (mitral incompetence) 06/16/2015  . DDD (degenerative disc disease), lumbar 06/07/2015  . Primary osteoarthritis of left knee 06/07/2015  . Status post right knee replacement 06/07/2015  . Acute right hip pain 05/16/2015  . Leg pain 04/22/2015  . Abnormal LFTs 04/15/2015  . BP (high blood pressure) 04/15/2015  . Reduced mobility 04/15/2015  . Chronic urticaria 04/15/2015  . Hyperlipidemia 03/14/2015  . Chronic pain of left knee 01/31/2015  . Gonalgia 01/10/2015  . Clinical depression 01/10/2015  . Benign hypertension 01/10/2015  . Cannot sleep 01/10/2015  . Current tobacco use 01/10/2015  . LBP (low back pain) 12/13/2014  .  Continuous opioid dependence ([place]) 12/13/2014  . Anxiety 12/13/2014  . Cervical spine pain 12/13/2014  . Pain and swelling of left knee 12/13/2014    Past Surgical History:  Procedure Laterality Date  . ABDOMINAL HYSTERECTOMY     partial-uterus removed  . CHOLECYSTECTOMY     2011  . COCCYX REMOVAL     [facility]  . COLONOSCOPY WITH PROPOFOL N/A 03/11/2017   Procedure: COLONOSCOPY WITH PROPOFOL;   Surgeon: Lin Landsman, MD;  Location: [facility] ENDOSCOPY;  Service: Gastroenterology;  Laterality: N/A;  . ESOPHAGOGASTRODUODENOSCOPY (EGD) WITH PROPOFOL N/A 03/11/2017   Procedure: ESOPHAGOGASTRODUODENOSCOPY (EGD) WITH PROPOFOL;  Surgeon: Lin Landsman, MD;  Location: [facility] ENDOSCOPY;  Service: Gastroenterology;  Laterality: N/A;  . EYE SURGERY     states both lens replaced. Not sure if cataract surgery or not.  [name] HEEL SPUR SURGERY    . I&D EXTREMITY  08/27/2011   Procedure: IRRIGATION AND DEBRIDEMENT EXTREMITY;  Surgeon: [name], MD;  Location: [place];  Service: Orthopedics;  Laterality: Right;  . JOINT REPLACEMENT Right 2012   Right TKR  . KNEE ARTHROPLASTY  07/28/2011   Procedure: COMPUTER ASSISTED TOTAL KNEE ARTHROPLASTY;  Surgeon: [name], MD;  Location: [place];  Service: Orthopedics;  Laterality: Right;  right total knee arthroplasty, computer assisted  . KNEE ARTHROSCOPY     right knee  . TMJ ARTHROPLASTY     [place]    Prior to Admission medications   Medication Sig Start Date End Date Taking? Authorizing Provider  aspirin 81 MG EC tablet Take 1 tablet (81 mg total) by mouth daily. 08/30/16  Yes Wieting, Richard, MD  atenolol (TENORMIN) 100 MG tablet TAKE 1 TABLET (100 MG TOTAL) BY MOUTH DAILY. 03/29/17  Yes Roselee Nova, MD  citalopram (CELEXA) 10 MG tablet Take 1 tablet (10 mg total) by mouth daily. 02/04/17  Yes Roselee Nova, MD  cyclobenzaprine (FLEXERIL) 5 MG tablet TAKE ONE AND ONE-HALF TABLETS BY MOUTH EVERY NIGHT AT BEDTIME 03/04/17  Yes Roselee Nova, MD  furosemide (LASIX) 40 MG tablet Take 2 tablets (80 mg total) by mouth daily. In the morning 04/02/17 07/01/17 Yes Vanga, Tally Due, MD  hydrOXYzine (ATARAX/VISTARIL) 25 MG tablet Take 1 tablet (25 mg total) by mouth every 8 (eight) hours as needed. 01/07/17  Yes Roselee Nova, MD  Magnesium Oxide 400 (240 Mg) MG TABS TAKE 1 TABLET BY MOUTH TWICE A DAY 03/30/17  Yes Vanga, Tally Due, MD  omeprazole (PRILOSEC) 40 MG capsule Take 1 capsule (40 mg total) by mouth 2 (two) times daily before a meal. 03/11/17 06/09/17 Yes Vanga, Tally Due, MD  spironolactone (ALDACTONE) 100 MG tablet Take 2 tablets (200 mg total) by mouth daily. 04/02/17 07/01/17 Yes Vanga, Tally Due, MD  Vitamin D, Ergocalciferol, (DRISDOL) 50000 units CAPS capsule Take 1 capsule (50,000 Units total) by mouth every 7 (seven) days. 03/03/17 06/17/17 Yes Vanga, Tally Due, MD  albuterol (PROVENTIL HFA;VENTOLIN HFA) 108 (90 Base) MCG/ACT inhaler Inhale 2 puffs into the lungs every 6 (six) hours as needed for wheezing or shortness of breath. 08/29/16   Loletha Grayer, MD  alprazolam Duanne Moron) 2 MG tablet Take 1 tablet (2 mg total) by mouth 2 (two) times daily as needed for anxiety. 03/04/17   Roselee Nova, MD  amoxicillin (AMOXIL) 500 MG capsule  03/12/17   [provider]  fluconazole (DIFLUCAN) 200 MG tablet  03/18/17   [provider]  oxyCODONE (OXY IR/ROXICODONE) 5 MG immediate release tablet Take 1 tablet (5 mg total) by mouth every 8 (eight) hours as needed for moderate pain. 03/04/17   Roselee Nova, MD  potassium chloride SA (K-DUR,KLOR-CON) 20 MEQ tablet  11/09/16   [provider]    Allergies Bacitracin-neomycin-polymyxin; Codeine; Gabapentin; Neosporin  [neomycin-bacitracin zn-polymyx]; and Pregabalin  Family History  Problem Relation Age of Onset  . Heart disease Mother   . Hypertension Mother   . Hypertension Father   . Heart disease Sister 72       stent   . Heart attack Sister   . Hyperlipidemia Sister   . Hypertension Sister   . Heart attack Maternal Uncle 64  . Heart disease Maternal Uncle        CABG    Social History Social History  Substance Use Topics  . Smoking status: Former Smoker    Packs/day: 0.25    Years: 50.00    Types: Cigarettes    Quit date: 03/20/2017  . Smokeless tobacco: Never Used  . Alcohol use No    Review of  Systems  Constitutional: No fever. Eyes: No visual changes. ENT: No sore throat. Cardiovascular: Denies chest pain. Respiratory: Positive for shortness of breath. Gastrointestinal: Positive for nausea, no vomiting.  Genitourinary: Negative for dysuria.  Musculoskeletal: Negative for back pain. Skin: Negative for rash. Neurological: Negative for headaches, focal weakness or numbness.   ____________________________________________   PHYSICAL EXAM:  VITAL SIGNS: ED Triage Vitals  Enc Vitals Group     BP 04/03/17 1307 (!) 126/55     Pulse Rate 04/03/17 1307 79     Resp 04/03/17 1307 20     Temp 04/03/17 1307 98.1 F (36.7 C)     Temp Source 04/03/17 1307 Oral     SpO2 04/03/17 1307 97 %     Weight 04/03/17 1308 282 lb (127.9 kg)     Height 04/03/17 1308 5\' 8"  (1.727 m)     Head Circumference --      Peak Flow --      Pain Score 04/03/17 1307 10     Pain Loc --      Pain Edu? --      Excl. in [place]? --     Constitutional: Alert and oriented. Uncomfortable appearing; in no acute distress. Eyes: Conjunctivae are normal.  No scleral icterus.  Head: Atraumatic. Nose: No congestion/rhinnorhea. Mouth/Throat: Mucous membranes are moist.   Neck: Normal range of motion.  Cardiovascular: Normal rate, regular rhythm. Grossly normal heart sounds.  Good peripheral circulation. Respiratory: Normal respiratory effort.  No retractions. Lungs CTAB. Gastrointestinal: Obese abdomen.  Soft with moderate RUQ and R mid abd tenderness.  Nondistended.  No fluid wave.   Genitourinary: No CVA tenderness. Musculoskeletal: No lower extremity edema.  Extremities warm and well perfused.  Neurologic:  Normal speech and language. No gross focal neurologic deficits are appreciated.  Skin:  Skin is warm and dry. No rash noted. Psychiatric: Mood and affect are normal. Speech and behavior are normal.  ____________________________________________   LABS (all labs ordered are listed, but only abnormal  results are displayed)  Labs Reviewed  CBC - Abnormal; Notable for the following:       Result Value   RBC 3.01 (*)    MCV 117.3 (*)    MCH 42.2 (*)    RDW 20.4 (*)    Platelets 105 (*)    All other components within normal limits  URINALYSIS, COMPLETE (UACMP)  WITH MICROSCOPIC - Abnormal; Notable for the following:    Color, Urine AMBER (*)    APPearance CLOUDY (*)    Bilirubin Urine SMALL (*)    Ketones, ur 5 (*)    Protein, ur 100 (*)    Leukocytes, UA TRACE (*)    Bacteria, UA RARE (*)    Squamous Epithelial / LPF TOO NUMEROUS TO COUNT (*)    All other components within normal limits  AMMONIA - Abnormal; Notable for the following:    Ammonia 51 (*)    All other components within normal limits  COMPREHENSIVE METABOLIC PANEL - Abnormal; Notable for the following:    Potassium 5.5 (*)    Glucose, Bld 152 (*)    Calcium 8.7 (*)    Total Protein 6.4 (*)    Albumin 2.7 (*)    AST 55 (*)    Alkaline Phosphatase 139 (*)    Total Bilirubin 6.4 (*)    All other components within normal limits  LIPASE, BLOOD  TROPONIN I  BASIC METABOLIC PANEL   ____________________________________________  EKG  ED ECG REPORT I, Arta Silence, the attending physician, personally viewed and interpreted this ECG.  Date: 04/03/2017 EKG Time: 1313 Rate: 88 Rhythm: sinus arrhythmia (computer read is "unknown rhythm" but there appear to be P waves before each QRS QRS Axis: borderline R axis Intervals: normal ST/T Wave abnormalities: normal Narrative Interpretation: no evidence of acute ischemia  ____________________________________________  RADIOLOGY  CXR: increasing interstitial opacities consistent with pulm edema   CT abd: cirrhosis with no acute findings.   ____________________________________________   PROCEDURES  Procedure(s) performed: No    Critical Care performed: No ____________________________________________   INITIAL IMPRESSION / ASSESSMENT AND PLAN / ED  COURSE  Pertinent labs & imaging results that were available during my care of the patient were reviewed by me and considered in my medical decision making (see chart for details).  61 year old female with history of liver cirrhosis and other PMH as noted presents with right-sided abdominal and flank pain since last night, associated with nausea. In the ED patient's vital signs are normal, she is uncomfortable but not acutely ill appearing, and she has moderate tenderness in the right upper quadrant. Patient had routine visit with her GI specialist yesterday but was not having these symptoms at that time. Patient is status post cholecystectomy. Differential includes colitis, diverticulitis, ureteral stone, pyelonephritis, less likely hepatobiliary cause. Patient has no significant ascites on exam and given the focal nature of the pain this is not consistent with SBP.  Patient also with relatively new onset shortness of breath and borderline O2 sat in the ED. This may be due to splinting and atelectasis from her pain, but will obtain chest x-ray and troponin to further assess.    ----------------------------------------- 6:20 PM on 04/03/2017 -----------------------------------------  Patient's workup reveals likely mild pulmonary edema, and CT is negative.  UA is consistent with acute UTI.   Given no other findings on CT, the patient's right sided flank pain is most consistent with pyelonephritis, so abx ordered.  (Pt also given diflucan as she states she was dx'ed with yeast infection and got antibiotic rx but didn't take it yet).  Lab workup reveals elevated ammonia although she is not altered at this time, and slightly elevated potassium from prior, although she has no EKG changes at this time or indication for emergent intervention.  Plan for admission for pyelonephritis especially due to the concomitant pulmonary edema and patient's hypoxia to high 80s off  of  O2.  ____________________________________________   FINAL CLINICAL IMPRESSION(S) / ED DIAGNOSES  Final diagnoses:  Pyelonephritis  Acute pulmonary edema ([place])      NEW MEDICATIONS STARTED DURING THIS VISIT:  Current Discharge Medication List       Note:  This document was prepared using Dragon voice recognition software and may include unintentional dictation errors.     Arta Silence, MD 04/03/17 2039

## 2017-04-04 LAB — BASIC METABOLIC PANEL
ANION GAP: 7 (ref 5–15)
BUN: 27 mg/dL — AB (ref 6–20)
CALCIUM: 8.5 mg/dL — AB (ref 8.9–10.3)
CO2: 23 mmol/L (ref 22–32)
Chloride: 102 mmol/L (ref 101–111)
Creatinine, Ser: 1.05 mg/dL — ABNORMAL HIGH (ref 0.44–1.00)
GFR calc Af Amer: >60 mL/min (ref >60)
GFR, EST NON AFRICAN AMERICAN: 56 mL/min — AB (ref >60)
GLUCOSE: 144 mg/dL — AB (ref 65–99)
Potassium: 6.7 mmol/L (ref 3.5–5.1)
SODIUM: 132 mmol/L — AB (ref 135–145)

## 2017-04-04 LAB — POTASSIUM: Potassium: 4.7 mmol/L (ref 3.5–5.1)

## 2017-04-04 MED ORDER — SODIUM POLYSTYRENE SULFONATE 15 GM/60ML PO SUSP
45.0000 g | Freq: Once | ORAL | Status: AC
  Administered 2017-04-04: 45 g via ORAL
  Filled 2017-04-04: qty 180

## 2017-04-04 MED ORDER — DEXTROSE 50 % IV SOLN
25.0000 mL | Freq: Once | INTRAVENOUS | Status: AC
  Administered 2017-04-04: 25 mL via INTRAVENOUS
  Filled 2017-04-04: qty 50

## 2017-04-04 MED ORDER — INSULIN ASPART 100 UNIT/ML IV SOLN
10.0000 [IU] | Freq: Once | INTRAVENOUS | Status: AC
  Administered 2017-04-04: 10 [IU] via INTRAVENOUS
  Filled 2017-04-04: qty 0.1

## 2017-04-04 NOTE — Progress Notes (Signed)
TEXT PAGED DR. Darvin Neighbours TO MAKE AWARE CURRENT POTASSIUM 6.7

## 2017-04-04 NOTE — Progress Notes (Signed)
Spoke with dr. Darvin Neighbours regard low bp and ekg results. Per md hold am dose of lasix, discontinued atenolol and aware of ekg results

## 2017-04-04 NOTE — Progress Notes (Signed)
TEXT PAGED DR. Darvin Neighbours TO MAKE AWARE EKG SHOWED JUNCTIONAL RHYTHM AND CURRENT BP 95/59 HAS 40MG  IV LASIX GIVE OR HOLD AM DOSE. WAITING FOR MD TO RETURN PAGE

## 2017-04-04 NOTE — Progress Notes (Signed)
[place] [facility] [place] NAME: Leah Shah    MR#:  [id number]  DATE OF BIRTH:  1955/11/13  SUBJECTIVE:  CHIEF COMPLAINT:   Chief Complaint  Patient presents with  . Flank Pain   History of shortness of breath, abdominal pain and fatigue.  REVIEW OF SYSTEMS:    Review of Systems  Constitutional: Positive for malaise/fatigue. Negative for chills and fever.  HENT: Negative for sore throat.   Eyes: Negative for blurred vision, double vision and pain.  Respiratory: Positive for shortness of breath. Negative for cough, hemoptysis and wheezing.   Cardiovascular: Negative for chest pain, palpitations, orthopnea and leg swelling.  Gastrointestinal: Positive for abdominal pain. Negative for constipation, diarrhea, heartburn, nausea and vomiting.  Genitourinary: Negative for dysuria and hematuria.  Musculoskeletal: Negative for back pain and joint pain.  Skin: Negative for rash.  Neurological: Positive for weakness. Negative for sensory change, speech change, focal weakness and headaches.  Endo/Heme/Allergies: Does not bruise/bleed easily.  Psychiatric/Behavioral: Negative for depression. The patient is not nervous/anxious.     DRUG ALLERGIES:   Allergies  Allergen Reactions  . Bacitracin-Neomycin-Polymyxin Other (See Comments)  . Codeine Other (See Comments)    Itching "she can take some forms of it" Itching "she can take some forms of it"  . Gabapentin Nausea Only  . Neosporin  [Neomycin-Bacitracin Zn-Polymyx]   . Pregabalin Other (See Comments)    stomach issues stomach issues    VITALS:  Blood pressure (!) 99/37, pulse 72, temperature 97.6 F (36.4 C), resp. rate 18, height 5\' 8"  (1.727 m), weight 127.9 kg (282 lb), SpO2 95 %.  PHYSICAL EXAMINATION:   Physical Exam  GENERAL:  61 y.o.-year-old patient lying in the bed . EYES: Pupils equal, round, reactive to light and accommodation. No scleral icterus. Extraocular muscles intact.   HEENT: Head atraumatic, normocephalic. Oropharynx and nasopharynx clear.  NECK:  Supple, no jugular venous distention. No thyroid enlargement, no tenderness.  LUNGS Poor air entry bilaterally CARDIOVASCULAR: S1, S2 normal. No murmurs, rubs, or gallops.  ABDOMEN: Soft, tender, nondistended. Bowel sounds present. No organomegaly or mass.  EXTREMITIES: No cyanosis, clubbing or edema b/l.    NEUROLOGIC: Cranial nerves II through XII are intact. No focal Motor or sensory deficits b/l.   PSYCHIATRIC: The patient is alert and oriented x 3.  SKIN: No obvious rash, lesion, or ulcer.   LABORATORY PANEL:   CBC  Recent Labs Lab 04/03/17 1309  WBC 5.9  HGB 12.7  HCT 35.2  PLT 105*   ------------------------------------------------------------------------------------------------------------------ Chemistries   Recent Labs Lab 04/02/17 1220 04/03/17 1413 04/04/17 0630 04/04/17 1145  NA 136 136 132*  --   K 5.0 5.5* 6.7* 4.7  CL 103 104 102  --   CO2 26 24 23   --   GLUCOSE 136* 152* 144*  --   BUN 16 20 27*  --   CREATININE 1.07* 0.92 1.05*  --   CALCIUM 9.2 8.7* 8.5*  --   MG 1.7  --   --   --   AST 49* 55*  --   --   ALT 30 30  --   --   ALKPHOS 161* 139*  --   --   BILITOT 5.3* 6.4*  --   --    ------------------------------------------------------------------------------------------------------------------  Cardiac Enzymes  Recent Labs Lab 04/03/17 1413  TROPONINI <0.03   ------------------------------------------------------------------------------------------------------------------  RADIOLOGY:  Dg Chest 2 View  Result Date: 04/03/2017 CLINICAL DATA:  Acute shortness  of breath for 1 day. EXAM: CHEST  2 VIEW COMPARISON:  08/27/2016 FINDINGS: The cardiomediastinal silhouette is unremarkable. Increasing interstitial opacities likely representing edema noted. No pleural effusion or pneumothorax. No acute bony abnormalities are present. IMPRESSION: Increasing  interstitial opacities likely representing interstitial edema. Electronically Signed   By: Margarette Canada M.D.   On: 04/03/2017 15:13   Ct Abdomen Pelvis W Contrast  Result Date: 04/03/2017 CLINICAL DATA:  Right lower quadrant abdominal pain and flank pain. EXAM: CT ABDOMEN AND PELVIS WITH CONTRAST TECHNIQUE: Multidetector CT imaging of the abdomen and pelvis was performed using the standard protocol following bolus administration of intravenous contrast. CONTRAST:  100 cc of Isovue-300 COMPARISON:  03/17/2017 FINDINGS: Lower chest: No acute abnormality. Hepatobiliary: Previous cholecystectomy. Mild increase caliber of the common bile duct which measures 9 mm. No intrahepatic bile duct dilatation. Pancreas: Unremarkable. No pancreatic ductal dilatation or surrounding inflammatory changes. Spleen: The spleen is enlarged measuring 14.5 x 10.9 x 5.8 cm (volume = 480 cm^3). Adrenals/Urinary Tract: Normal appearance of the adrenal glands. The kidneys are unremarkable. No mass or hydronephrosis identified. The urinary bladder appears normal. Stomach/Bowel: Stomach is normal. Small bowel loops are mildly prominent measuring up to 3.2 cm. The appendix is visualized and appears normal. There is mild wall thickening involving the cecum and ascending colon. Nonspecific in the setting of cirrhosis and portal venous hypertension with ascites. Vascular/Lymphatic: Aortic atherosclerosis. No aneurysm. 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 Prominent upper abdominal lymph nodes are nonspecific in the setting of cirrhosis and portal venous hypertension. No pelvic or inguinal adenopathy. Reproductive: Status post hysterectomy. No adnexal masses. Other: There is a small volume of ascites within the abdomen and pelvis. No focal fluid collections identified. Musculoskeletal: No aggressive lytic or sclerotic bone lesions. Multi level degenerative disc disease  noted. IMPRESSION: 1. Morphologic features liver compatible with cirrhosis. 2. Stigmata of portal venous hypertension including: Splenomegaly, varicosities, and abdominal ascites. 3.  Aortic Atherosclerosis (ICD10-I70.0). Electronically Signed   By: Kerby Moors M.D.   On: 04/03/2017 16:35     ASSESSMENT AND PLAN:   Leah Shah  is a 62 y.o. female with a known history of cirrhosis of liver with portal hypertension and thrombocytopenia, chronic diastolic congestive heart failure, arthritis, anxiety comes to the emergency room accompanied by daughter and son with complaints of right flank pain. According to the daughter patient had reddish tinge to her urine. She denies any history of kidney stone.  * Hyperkalemia with EKG changes. Stat dose of D50 and insulin given. Will give Lasix. Continue telemetry monitoring. Repeat potassium levels in 4 hours. Critically ill. Stop patient's Aldactone and potassium supplements that she was taking at home.  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 Marius Ditch -Continue Lasix IV 40 twice a day  * Hypertension. Presently patient's blood pressure is low normal. Stop blood pressure medications.  * Portal hypertensive gastropathy and splenomegaly with thrombocytopenia secondary to #3 -Symptomatic management  * morbid obesity  * DVT prophylaxis SCDs and teds  All the records are  reviewed and case discussed with Care Management/Social Workerr. Management plans discussed with the patient, family and they are in  agreement.  CODE STATUS: FULL CODE  DVT Prophylaxis: SCDs  TOTAL CC TIME TAKING CARE OF THIS PATIENT: 35 minutes.   POSSIBLE D/C IN 2-3 DAYS, DEPENDING ON CLINICAL CONDITION.  Hillary Bow R M.D on 04/04/2017 at 1:27 PM  Between 7am to 6pm - Pager - [phone]  After 6pm go to [email or web address] - password [email or web address] [facility]  Office  [phone]  CC: Primary care physician; Roselee Nova, MD  Note: This dictation was prepared with Dragon dictation along with smaller phrase technology. Any transcriptional errors that result from this process are unintentional.

## 2017-04-04 NOTE — Progress Notes (Signed)
PT Cancellation Note  Patient Details Name: Leah Shah MRN: [id number] DOB: 05/26/1956   Cancelled Treatment:    Reason Eval/Treat Not Completed: Medical issues which prohibited therapy.  Extremely low BP and has very high K+.  Will check later to see how pt is progressing.   Ramond Dial 04/04/2017, 9:32 AM   Mee Hives, PT MS Acute Rehab Dept. Number: [place] and [employer]

## 2017-04-05 ENCOUNTER — Ambulatory Visit: Payer: Medicare HMO | Admitting: Family Medicine

## 2017-04-05 ENCOUNTER — Inpatient Hospital Stay: Payer: Medicare HMO

## 2017-04-05 LAB — CBC WITH DIFFERENTIAL/PLATELET
BASOS ABS: 0 10*3/uL (ref 0–0.1)
BASOS PCT: 0 %
EOS ABS: 0.1 10*3/uL (ref 0–0.7)
Eosinophils Relative: 2 %
HCT: 29.6 % — ABNORMAL LOW (ref 35.0–47.0)
HEMOGLOBIN: 10.5 g/dL — AB (ref 12.0–16.0)
LYMPHS ABS: 1.3 10*3/uL (ref 1.0–3.6)
Lymphocytes Relative: 17 %
MCH: 43.5 pg — ABNORMAL HIGH (ref 26.0–34.0)
MCHC: 35.6 g/dL (ref 32.0–36.0)
MCV: 122.1 fL — ABNORMAL HIGH (ref 80.0–100.0)
Monocytes Absolute: 0.8 10*3/uL (ref 0.2–0.9)
Monocytes Relative: 12 %
NEUTROS PCT: 69 %
Neutro Abs: 5.1 10*3/uL (ref 1.4–6.5)
PLATELETS: 47 10*3/uL — AB (ref 150–440)
RBC: 2.42 MIL/uL — AB (ref 3.80–5.20)
RDW: 19.2 % — ABNORMAL HIGH (ref 11.5–14.5)
WBC: 7.3 10*3/uL (ref 3.6–11.0)

## 2017-04-05 LAB — COMPREHENSIVE METABOLIC PANEL
ALBUMIN: 2.4 g/dL — AB (ref 3.5–5.0)
ALK PHOS: 103 U/L (ref 38–126)
ALT: 25 U/L (ref 14–54)
AST: 38 U/L (ref 15–41)
Anion gap: 4 — ABNORMAL LOW (ref 5–15)
BUN: 26 mg/dL — ABNORMAL HIGH (ref 6–20)
CALCIUM: 8 mg/dL — AB (ref 8.9–10.3)
CO2: 31 mmol/L (ref 22–32)
CREATININE: 0.91 mg/dL (ref 0.44–1.00)
Chloride: 101 mmol/L (ref 101–111)
GFR calc Af Amer: >60 mL/min (ref >60)
GFR calc non Af Amer: >60 mL/min (ref >60)
GLUCOSE: 156 mg/dL — AB (ref 65–99)
Potassium: 4.4 mmol/L (ref 3.5–5.1)
SODIUM: 136 mmol/L (ref 135–145)
Total Bilirubin: 5.2 mg/dL — ABNORMAL HIGH (ref 0.3–1.2)
Total Protein: 5.6 g/dL — ABNORMAL LOW (ref 6.5–8.1)

## 2017-04-05 MED ORDER — LACTULOSE 10 GM/15ML PO SOLN
10.0000 g | Freq: Two times a day (BID) | ORAL | Status: DC
  Administered 2017-04-05 – 2017-04-07 (×4): 10 g via ORAL
  Filled 2017-04-05 (×4): qty 30

## 2017-04-05 MED ORDER — CEPHALEXIN 500 MG PO CAPS
500.0000 mg | ORAL_CAPSULE | Freq: Two times a day (BID) | ORAL | Status: DC
  Administered 2017-04-05 – 2017-04-07 (×5): 500 mg via ORAL
  Filled 2017-04-05 (×5): qty 1

## 2017-04-05 NOTE — Progress Notes (Addendum)
[facility] [place] NAME: Leah Shah    MR#:  [id number]  DATE OF BIRTH:  1955-11-18  SUBJECTIVE:   Came in with increasing shortness of breath. Patient feels a lot better.  good urine output. Some abdominal bloating. REVIEW OF SYSTEMS:   Review of Systems  Constitutional: Negative for chills, fever and weight loss.  HENT: Negative for ear discharge, ear pain and nosebleeds.   Eyes: Negative for blurred vision, pain and discharge.  Respiratory: Negative for sputum production, shortness of breath, wheezing and stridor.   Cardiovascular: Negative for chest pain, palpitations, orthopnea and PND.  Gastrointestinal: Negative for abdominal pain, diarrhea, nausea and vomiting.  Genitourinary: Negative for frequency and urgency.  Musculoskeletal: Negative for back pain and joint pain.  Neurological: Negative for sensory change, speech change, focal weakness and weakness.  Psychiatric/Behavioral: Negative for depression and hallucinations. The patient is not nervous/anxious.    Tolerating Diet:yes Tolerating PT: SNF  DRUG ALLERGIES:   Allergies  Allergen Reactions  . Bacitracin-Neomycin-Polymyxin Other (See Comments)  . Codeine Other (See Comments)    Itching "she can take some forms of it" Itching "she can take some forms of it"  . Gabapentin Nausea Only  . Neosporin  [Neomycin-Bacitracin Zn-Polymyx]   . Pregabalin Other (See Comments)    stomach issues stomach issues    VITALS:  Blood pressure (!) 141/50, pulse 83, temperature 97.8 F (36.6 C), temperature source Oral, resp. rate 18, height 5\' 8"  (1.727 m), weight 127.9 kg (282 lb), SpO2 (!) 89 %.  PHYSICAL EXAMINATION:   Physical Exam  GENERAL:  61 y.o.-year-old patient lying in the bed with no acute distress.  EYES: Pupils equal, round, reactive to light and accommodation. No scleral icterus. Extraocular muscles intact.  HEENT: Head atraumatic, normocephalic. Oropharynx  and nasopharynx clear.  NECK:  Supple, no jugular venous distention. No thyroid enlargement, no tenderness.  LUNGS: Normal breath sounds bilaterally, no wheezing, rales, rhonchi. No use of accessory muscles of respiration.  CARDIOVASCULAR: S1, S2 normal. No murmurs, rubs, or gallops.  ABDOMEN: Soft, nontender, nondistended. Bowel sounds present. No organomegaly or mass.  EXTREMITIES: No cyanosis, clubbing or edema b/l.    NEUROLOGIC: Cranial nerves II through XII are intact. No focal Motor or sensory deficits b/l.   PSYCHIATRIC:  patient is alert and oriented x 3.  SKIN: No obvious rash, lesion, or ulcer.   LABORATORY PANEL:  CBC  Recent Labs Lab 04/05/17 0358  WBC 7.3  HGB 10.5*  HCT 29.6*  PLT 47*    Chemistries   Recent Labs Lab 04/02/17 1220  04/05/17 0358  NA 136  < > 136  K 5.0  < > 4.4  CL 103  < > 101  CO2 26  < > 31  GLUCOSE 136*  < > 156*  BUN 16  < > 26*  CREATININE 1.07*  < > 0.91  CALCIUM 9.2  < > 8.0*  MG 1.7  --   --   AST 49*  < > 38  ALT 30  < > 25  ALKPHOS 161*  < > 103  BILITOT 5.3*  < > 5.2*  < > = values in this interval not displayed. Cardiac Enzymes  Recent Labs Lab 04/03/17 1413  TROPONINI <0.03   RADIOLOGY:  Dg Chest 2 View  Result Date: 04/03/2017 CLINICAL DATA:  Acute shortness of breath for 1 day. EXAM: CHEST  2 VIEW COMPARISON:  08/27/2016 FINDINGS: The cardiomediastinal silhouette is  unremarkable. Increasing interstitial opacities likely representing edema noted. No pleural effusion or pneumothorax. No acute bony abnormalities are present. IMPRESSION: Increasing interstitial opacities likely representing interstitial edema. Electronically Signed   By: Margarette Canada M.D.   On: 04/03/2017 15:13   Ct Abdomen Pelvis W Contrast  Result Date: 04/03/2017 CLINICAL DATA:  Right lower quadrant abdominal pain and flank pain. EXAM: CT ABDOMEN AND PELVIS WITH CONTRAST TECHNIQUE: Multidetector CT imaging of the abdomen and pelvis was performed  using the standard protocol following bolus administration of intravenous contrast. CONTRAST:  100 cc of Isovue-300 COMPARISON:  03/17/2017 FINDINGS: Lower chest: No acute abnormality. Hepatobiliary: Previous cholecystectomy. Mild increase caliber of the common bile duct which measures 9 mm. No intrahepatic bile duct dilatation. Pancreas: Unremarkable. No pancreatic ductal dilatation or surrounding inflammatory changes. Spleen: The spleen is enlarged measuring 14.5 x 10.9 x 5.8 cm (volume = 480 cm^3). Adrenals/Urinary Tract: Normal appearance of the adrenal glands. The kidneys are unremarkable. No mass or hydronephrosis identified. The urinary bladder appears normal. Stomach/Bowel: Stomach is normal. Small bowel loops are mildly prominent measuring up to 3.2 cm. The appendix is visualized and appears normal. There is mild wall thickening involving the cecum and ascending colon. Nonspecific in the setting of cirrhosis and portal venous hypertension with ascites. Vascular/Lymphatic: Aortic atherosclerosis. No aneurysm. 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 Prominent upper abdominal lymph nodes are nonspecific in the setting of cirrhosis and portal venous hypertension. No pelvic or inguinal adenopathy. Reproductive: Status post hysterectomy. No adnexal masses. Other: There is a small volume of ascites within the abdomen and pelvis. No focal fluid collections identified. Musculoskeletal: No aggressive lytic or sclerotic bone lesions. Multi level degenerative disc disease noted. IMPRESSION: 1. Morphologic features liver compatible with cirrhosis. 2. Stigmata of portal venous hypertension including: Splenomegaly, varicosities, and abdominal ascites. 3.  Aortic Atherosclerosis (ICD10-I70.0). Electronically Signed   By: Kerby Moors M.D.   On: 04/03/2017 16:35   US Abdomen Limited  Result Date: 04/05/2017 CLINICAL DATA:  Cirrhotic liver  disease and ascites. Evaluation of ascites performed prior to possible paracentesis. EXAM: LIMITED ABDOMEN ULTRASOUND FOR ASCITES TECHNIQUE: Limited ultrasound survey for ascites was performed in all four abdominal quadrants. COMPARISON:  CT of the abdomen on 04/03/2017 FINDINGS: Ultrasound demonstrates only a trace amount of ascites scattered between bowel loops in the peritoneal cavity. There is not enough fluid to allow paracentesis. IMPRESSION: Trace ascites in the peritoneal cavity. There was not enough fluid present to allow for paracentesis. Electronically Signed   By: Aletta Edouard M.D.   On: 04/05/2017 13:36   ASSESSMENT AND PLAN:  Leah Smithis a 61 y.o. femalewith a known history of cirrhosis of liver with portal hypertension and thrombocytopenia, chronic diastolic congestive heart failure, arthritis, anxiety comes to the emergency room accompanied by daughter and son with complaints of right flank pain. According to the daughter patient had reddish tinge to her urine. She denies any history of kidney stone.  * Hyperkalemia with EKG changes. Received Stat dose of D50 and insulin ,Lasix.  -Stop patient's Aldactone and potassium supplements that she was taking at home. 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 Abundio Miu Liver biospy is held due to acute illness. Pt is advised to reschedule it -Continue Lasix IV 40 twice a day  * Hypertension. Presently patient's blood pressure is low normal. Stop  blood pressure medications.  * Portal hypertensive gastropathy and splenomegaly with thrombocytopenia secondary to #3 -Symptomatic management  * morbid obesity  * DVT prophylaxis SCDs and teds  Case discussed with Care Management/Social Worker. Management plans discussed with the patient, family and they are in agreement.  CODE STATUS: FULL   TOTAL TIME TAKING CARE OF THIS PATIENT: *30 minutes.  >50% time spent on counselling and coordination of care  POSSIBLE D/C IN *1-2* DAYS, DEPENDING ON CLINICAL CONDITION.  Note: This dictation was prepared with Dragon dictation along with smaller phrase technology. Any transcriptional errors that result from this process are unintentional.  Eluterio Seymour M.D on 04/05/2017 at 1:52 PM  Between 7am to 6pm - Pager - [phone]  After 6pm go to [email or web address] - password [email or web address] [facility]  Office  [phone]  CC: Primary care physician; Roselee Nova, MDPatient ID: Leah Shah, female   DOB: October 02, 1955, 61 y.o.   MRN: [id number]

## 2017-04-05 NOTE — Progress Notes (Signed)
OT Cancellation Note  Patient Details Name: Leah Shah MRN: [id number] DOB: 10-04-1955   Cancelled Treatment:    Reason Eval/Treat Not Completed: Patient at procedure or test/ unavailable. Order received, chart reviewed. Spoke with RN who confirmed pt is out of the room for paracentesis. Will hold OT evaluation this date and re-attempt next date as pt is medically appropriate and available.  Jeni Salles, MPH, MS, OTR/L ascom [phone] 04/05/17, 1:15 PM

## 2017-04-05 NOTE — Clinical Social Work Note (Signed)
CSW met with patient and she agreed to go to SNF for short term rehab, patient has been faxed out awaiting bed offers.  Patient will need insurance authorization from [place].  Formal assessment to follow.  Jones Broom. [place], MSW, [place]  04/05/2017 5:16 PM

## 2017-04-05 NOTE — Discharge Instructions (Signed)
Heart Failure Clinic appointment on April 12 2017 at 9:20am with Darylene Price, [place]. Please call [phone] to reschedule.

## 2017-04-05 NOTE — Progress Notes (Signed)
SATURATION QUALIFICATIONS: (This note is used to comply with regulatory documentation for home oxygen)  Patient Saturations on Room Air at Rest =81 %  Patient Saturations on Room Air while Ambulating = %  Patient Saturations on 4 Liters of oxygen while Ambulating = %  Please briefly explain why patient needs home oxygen: Pt. Dyspneic at rest and exertion

## 2017-04-05 NOTE — Evaluation (Signed)
Physical Therapy Evaluation Patient Details Name: Leah Shah MRN: [id number] DOB: October 12, 1955 Today's Date: 04/05/2017   History of Present Illness  Pt admitted for UTI. History includes liver cirrhosis, CHF, and anxiety. Pt currently on 3L of O2 at this time.  Clinical Impression  Pt is a pleasant 61 year old female who was admitted for UTI. Pt performs transfers and ambulation with min assist and use of SPC. Pt demonstrates deficits with endurance/mobility/strength. Mobility performed on 3L of O2; O2 sats decrease with exertion, RN aware. Left RW in room for further ambulation trials to improve energy conservation. Also recommend OT referral. Would benefit from skilled PT to address above deficits and promote optimal return to PLOF; recommend transition to STR upon discharge from acute hospitalization.       Follow Up Recommendations SNF    Equipment Recommendations  Rolling walker with 5" wheels    Recommendations for Other Services       Precautions / Restrictions Precautions Precautions: Fall Restrictions Weight Bearing Restrictions: No      Mobility  Bed Mobility               General bed mobility comments: not performed as pt received seated in recliner  Transfers Overall transfer level: Needs assistance Equipment used: Straight cane Transfers: Sit to/from Stand Sit to Stand: Min assist         General transfer comment: stands with heavy assist of B arms on recliner. Pt reports increased pain with transition to standing. Upright posture noted with use of SPC. All mobility performed on 3L of O2.  Ambulation/Gait Ambulation/Gait assistance: Min assist Ambulation Distance (Feet): 20 Feet Assistive device: Straight cane Gait Pattern/deviations: Step-to pattern     General Gait Details: Pt ambulated using slow step to gait pattern, reaching out for furniture during ambulation. Slow gait speed noted with cues for pursed lip breathing during ambulation. 1  standing rest break needed secodnary to SOB symptoms. Pt on 3L of O2 with sats decreasing to 81%  Stairs            Wheelchair Mobility    Modified Rankin (Stroke Patients Only)       Balance Overall balance assessment: Needs assistance Sitting-balance support: Feet supported Sitting balance-[name] Scale: Good     Standing balance support: Single extremity supported Standing balance-[name] Scale: Fair                               Pertinent Vitals/Pain Pain Assessment: 0-10 Pain Score: 5  Pain Location: R side/flank Pain Descriptors / Indicators: Aching Pain Intervention(s): Limited activity within patient's tolerance;Premedicated before session    [place] expects to be discharged to:: Private residence Living Arrangements: Spouse/significant other Available Help at Discharge: Family Type of Home: House Home Access: Stairs to enter Entrance Stairs-Rails: Can reach both Entrance Stairs-Number of Steps: 3-4 Home Layout: One level Home Equipment: Cane - single point      Prior Function Level of Independence: Independent with assistive device(s)         Comments: Pt reports she was independent using SPC at baseline, able to ambulate household distances.     Hand Dominance        Extremity/Trunk Assessment   Upper Extremity Assessment Upper Extremity Assessment: Generalized weakness (B UE grossly 4/5)    Lower Extremity Assessment Lower Extremity Assessment: Generalized weakness (B LE grossly 3+/5)       Communication   Communication:  No difficulties  Cognition Arousal/Alertness: Awake/alert Behavior During Therapy: Flat affect Overall Cognitive Status: Within Functional Limits for tasks assessed                                        General Comments      Exercises Other Exercises Other Exercises: Post ambulation, pt takes significant time for O2 sats to improve with cues for pursed lip  breathing. Notified RN of decreased O2 sats, she entered room to assess.   Assessment/Plan    PT Assessment Patient needs continued PT services  PT Problem List Decreased strength;Decreased activity tolerance;Decreased balance;Decreased mobility;Decreased safety awareness;Decreased knowledge of use of DME;Pain       PT Treatment Interventions Gait training;DME instruction;Therapeutic exercise;Therapeutic activities;Balance training    PT Goals (Current goals can be found in the Care Plan section)  Acute Rehab PT Goals Patient Stated Goal: to get stronger PT Goal Formulation: With patient Time For Goal Achievement: 04/19/17 Potential to Achieve Goals: Good    Frequency Min 2X/week   Barriers to discharge Inaccessible home environment      Co-evaluation               AM-PAC PT "6 Clicks" Daily Activity  Outcome Measure Difficulty turning over in bed (including adjusting bedclothes, sheets and blankets)?: None Difficulty moving from lying on back to sitting on the side of the bed? : None Difficulty sitting down on and standing up from a chair with arms (e.g., wheelchair, bedside commode, etc,.)?: Unable Help needed moving to and from a bed to chair (including a wheelchair)?: A Little Help needed walking in hospital room?: A Little Help needed climbing 3-5 steps with a railing? : A Lot 6 Click Score: 17    End of Session Equipment Utilized During Treatment: Gait belt;Oxygen Activity Tolerance: Patient limited by fatigue Patient left: in chair;with chair alarm set;with nursing/sitter in room Nurse Communication: Mobility status PT Visit Diagnosis: Unsteadiness on feet (R26.81);Difficulty in walking, not elsewhere classified (R26.2);Pain Pain - Right/Left: Right Pain - part of body:  (abdomen)    Time: 4540-9811 PT Time Calculation (min) (ACUTE ONLY): 16 min   Charges:   PT Evaluation $PT Eval Moderate Complexity: 1 Mod PT Treatments $Therapeutic Activity: 8-22  mins   PT G Codes:   PT G-Codes **NOT FOR INPATIENT CLASS** Functional Assessment Tool Used: AM-PAC 6 Clicks Basic Mobility Functional Limitation: Mobility: Walking and moving around Mobility: Walking and Moving Around Current Status (B1478): At least 40 percent but less than 60 percent impaired, limited or restricted Mobility: Walking and Moving Around Goal Status [id number]): At least 20 percent but less than 40 percent impaired, limited or restricted    Greggory Stallion, PT, DPT [phone]   Leah Shah 04/05/2017, 11:04 AM

## 2017-04-05 NOTE — Plan of Care (Signed)
Problem: Food- and Nutrition-Related Knowledge Deficit (NB-1.1) Goal: Nutrition education Formal process to instruct or train a patient/client in a skill or to impart knowledge to help patients/clients voluntarily manage or modify food choices and eating behavior to maintain or improve health.  Outcome: Adequate for Discharge Nutrition Education Note  RD consulted for nutrition education regarding new onset CHF.  Patient reports usually not eating breakfast or lunch, will often eat out for dinner. She kind of mumbled when I asked her about places they normally go. States she has been told all about eating a low sodium diet before due to cirrhosis. States she is coming off of it now, ended up in the hospital due to eating "a bunch of dill pickles." Encouraged her to eat small meals for breakfast and lunch if possible, and to cook more rather than eat out due to the amount of sodium in prepared food at restaurants. She was open to this change. Also encouraged her to ask for no sodium when eating out.  RD provided "Low Sodium Nutrition Therapy" handout from the Academy of Nutrition and Dietetics. Reviewed patient's dietary recall. Provided examples on ways to decrease sodium intake in diet. Discouraged intake of processed foods and use of salt shaker. Encouraged fresh fruits and vegetables as well as whole grain sources of carbohydrates to maximize fiber intake.   RD discussed why it is important for patient to adhere to diet recommendations, and emphasized the role of fluids, foods to avoid, and importance of weighing self daily. Teach back method used.  Expect fair compliance.  Body mass index is 42.88 kg/m. Pt meets criteria for obese class III based on current BMI.  Current diet order is 2 gram sodium , patient is consuming approximately 50-100% of meals at this time. Labs and medications reviewed. No further nutrition interventions warranted at this time. RD contact information provided. If  additional nutrition issues arise, please re-consult RD.   Satira Anis. Marley Pakula, MS, RD LDN Inpatient Clinical Dietitian Pager [phone]

## 2017-04-05 NOTE — Progress Notes (Signed)
Pt. Drowsy with O2 dropping to 86-90% on 3 L O2 per nasal cannula, increased O2 to 4 L and O2 @ 93%, PRN breathing treatment ordered. Md Posey Pronto text paged with update will continue to monitor pt.

## 2017-04-05 NOTE — Progress Notes (Signed)
Pt. Due for IV lasix BP soft @ 136/51, text paged Dr. Marcille Blanco, to give or to hold the Lasix for now. Pt. Also experiencing minor nose bleed, applied lubricating jelly to nares, due to high O2 flow rate, and patient report of old scab in Left nare.

## 2017-04-05 NOTE — Progress Notes (Signed)
PT Cancellation Note  Patient Details Name: Leah Shah MRN: [id number] DOB: 10/10/1955   Cancelled Treatment:    Reason Eval/Treat Not Completed: Other (comment). Attempted evaluation, however pt currently eating breakfast and wishes therapy to return at later time. Will re-attempt, time permitting.   Jayven Naill 04/05/2017, 8:59 AM Greggory Stallion, PT, DPT [phone]

## 2017-04-05 NOTE — Progress Notes (Signed)
Patient ID: Leah Shah, female   DOB: 1956-04-02, 61 y.o.   MRN: [id number]  Interventional Radiology:  Ultrasound shows only trace ascites interspersed between bowel loops.  Not enough fluid to tap.  Venetia Night. Kathlene Cote, M.D Pager:  [phone]

## 2017-04-05 NOTE — Plan of Care (Signed)
Problem: Education: Goal: Knowledge of [facility] Education information/materials will improve Outcome: Progressing Pt with complaints of abdominal pain, treated with oxycodone, which gave relief. BP soft this morning. But pt has been low yesterday as well. Up with 1 assist and a cane to bathroom. On 3L O2.

## 2017-04-06 ENCOUNTER — Ambulatory Visit: Admission: RE | Admit: 2017-04-06 | Payer: Medicare HMO | Source: Ambulatory Visit

## 2017-04-06 MED ORDER — FUROSEMIDE 40 MG PO TABS
80.0000 mg | ORAL_TABLET | Freq: Every day | ORAL | Status: DC
  Administered 2017-04-07: 80 mg via ORAL
  Filled 2017-04-06: qty 2

## 2017-04-06 MED ORDER — FUROSEMIDE 10 MG/ML IJ SOLN
40.0000 mg | Freq: Two times a day (BID) | INTRAMUSCULAR | Status: AC
  Administered 2017-04-06: 40 mg via INTRAVENOUS
  Filled 2017-04-06: qty 4

## 2017-04-06 MED ORDER — SPIRONOLACTONE 25 MG PO TABS
100.0000 mg | ORAL_TABLET | Freq: Every day | ORAL | Status: DC
  Administered 2017-04-06 – 2017-04-07 (×2): 100 mg via ORAL
  Filled 2017-04-06 (×2): qty 4

## 2017-04-06 NOTE — Plan of Care (Signed)
Problem: Education: Goal: Knowledge of [facility] Education information/materials will improve Outcome: Progressing No complaints on pain this shift, pt still on 4L O2 which is acute, however pt is qualified for home O2. Up to bathroom with 1 assist and her cane, tolerating well.

## 2017-04-06 NOTE — Clinical Social Work Note (Signed)
Clinical Social Work Assessment  Patient Details  Name: Leah Shah MRN: [id number] Date of Birth: [birth year]  Date of referral:  04/06/17               Reason for consult:  Facility Placement                Permission sought to share information with:  Facility Sport and exercise psychologist, Family Supports Permission granted to share information::  Yes, Verbal Permission Granted  Name::     SNF admissions  Agency::  SNF admissions  Relationship::     Contact Information:     Housing/Transportation Living arrangements for the past 2 months:  Single Family Home Source of Information:  Patient Patient Interpreter Needed:  None Criminal Activity/Legal Involvement Pertinent to Current Situation/Hospitalization:  No - Comment as needed Significant Relationships:  Other Family Members, Spouse Lives with:  Self Do you feel safe going back to the place where you live?  No Need for family participation in patient care:  No (Coment)  Care giving concerns:  Patient feels she needs some short term rehab before she is able to return back home.   Social Worker assessment / plan:  Patient is a 61 year old female who is alert and oriented x4.  Patient lives alone, and plans to get some therapy first before returning back home.  Patient states she has not been to rehab before, CSW explained to patient what the process is for looking for placement and what to expect at SNF.  Patient was explained how insurance will pay for stay.  Patient was reminded it would be short term rehab only not long term.  CSW explained to patient what the next step is to get some rehab and then eventually return back home.  Patient gave CSW permission to begin bed search in [place] and [place] per her request.  Patient did not express any other questions or concerns  Employment status:  Retired Forensic scientist:  [employer] PT Recommendations:  [facility] / Referral to community  resources:  [facility]  Patient/Family's Response to care:  Patient in agreement to going to SNF for short term rehab.  Patient/Family's Understanding of and Emotional Response to Diagnosis, Current Treatment, and Prognosis:  Patient expressed that she is hopeful she will not have to be at [facility] for very long.  Emotional Assessment Appearance:  Appears older than stated age Attitude/Demeanor/Rapport:    Affect (typically observed):  Appropriate, Calm, Blunt Orientation:  Oriented to Self, Oriented to Place, Oriented to  Time, Oriented to Situation Alcohol / Substance use:  Not Applicable Psych involvement (Current and /or in the community):  No (Comment)  Discharge Needs  Concerns to be addressed:  Lack of Support Readmission within the last 30 days:  No Current discharge risk:  Lack of support system, Lives alone Barriers to Discharge:  Ship broker, Continued Medical Work up   [name] 04/06/2017, 3:40 PM

## 2017-04-06 NOTE — Progress Notes (Signed)
PT Cancellation Note  Patient Details Name: Leah Shah MRN: [id number] DOB: 29-Jan-1956   Cancelled Treatment:    Reason Eval/Treat Not Completed: Other (comment)   Session attempted.  Pt had just finished drinking a laxative and did not feel confident with mobility skills at this time.  Will continue as appropriate.   Chesley Noon 04/06/2017, 10:52 AM

## 2017-04-06 NOTE — Clinical Social Work Note (Signed)
CSW presented bed offers to patient and she chose [facility].  CSW contacted [facility] who said they can accept patient once insurance has been approved.  [facility] has begun Ship broker.  Jones Broom. [place], MSW, [place]  04/06/2017 2:17 PM

## 2017-04-06 NOTE — Evaluation (Signed)
Occupational Therapy Evaluation Patient Details Name: Leah Shah MRN: [id number] DOB: Feb 15, 1956 Today's Date: 04/06/2017    History of Present Illness Pt admitted for UTI. History includes liver cirrhosis, CHF, and anxiety.    Clinical Impression   Pt seen for OT evaluation this date. Prior to hospital admission, pt was modified independent using a SPC and endorses being a furniture walker. Was indep with ADL and bladder mgt (does get up hourly to use the bathroom overnight). Also endorses 1 fall in past 12 months (became lightheaded after sitting for prolonged period of time and had BLE swelling).  Pt lives with her spouse and dog in a rented home with 3-4 steps to enter with bilateral hand rails. Pt notes that she is a smoker but stopped approx 2 weeks ago. Currently pt is currently significantly limited by cardiopulmonary status. On 2L O2 in the recliner at rest, pt O2 sats at 84% with pt endorsing lightheadedness/feeling a little whoozy and a  5/10 headache. Educated in pursed lip breathing, sats did not improve within 1 minute. Increased to 3L O2, notified nursing to assess. O2 sats after approx 2 min increased to 90%. Pt required min G-min assist for sit to stand transfer with use of RW and VC for hand placement to maximize safety. Took approx 3 steps to [facility]. O2 sats dropped further to 76% on 3L O2. Increased to 4L O2 and VC for pursed lip breathing while seated on BSC with O2 sats improving to 90% within approx 2 minutes. Pt educated on need to take rest breaks for all activity (mobility, talking, drinking, eating, etc) and incorporate pursed lip breathing with pt verbalizing understanding. Handout on energy conservation provided.  RN notified of pt's status and O2 at end of session. Pt would benefit from additional training/education in energy conservation strategies, falls prevention, pursed lip breathing, home O2 mgt, and home/routines modifications to address noted impairments and functional  limitations (see below for any additional details) in order to maximize return to PLOF and minimize falls risk.  Upon hospital discharge, recommend pt discharge to STR prior to safe return home.    Follow Up Recommendations  SNF    Equipment Recommendations  3 in 1 bedside commode;Other (comment) (reacher, tub rail)    Recommendations for Other Services       Precautions / Restrictions Precautions Precautions: Fall Restrictions Weight Bearing Restrictions: No      Mobility Bed Mobility               General bed mobility comments: deferred, pt received up in recliner  Transfers Overall transfer level: Needs assistance Equipment used: Rolling walker (2 wheeled) Transfers: Sit to/from Stand Sit to Stand: Min assist         General transfer comment: stands with heavy assist of B arms on recliner. Initial mobility performed on 3L O2, increased to 4L O2 due to drop in O2 sats during toileting task (down to 76% on 3L, increased to 90% on 4L O2 within 1 minute with cues for PLB).     Balance Overall balance assessment: Needs assistance Sitting-balance support: Feet supported Sitting balance-[name] Scale: Good     Standing balance support: Bilateral upper extremity supported Standing balance-[name] Scale: Fair                             ADL either performed or assessed with clinical judgement   ADL Overall ADL's : Needs assistance/impaired Eating/Feeding: Sitting;Set up  Grooming: Sitting;Set up   Upper Body Bathing: Sitting;Set up;Minimal assistance   Lower Body Bathing: Sitting/lateral leans;Minimal assistance   Upper Body Dressing : Sitting;Min guard;Set up   Lower Body Dressing: Sitting/lateral leans;Sit to/from stand;Minimal assistance   Toilet Transfer: RW;Ambulation;BSC;Cueing for safety;Min guard;Minimal assistance Toilet Transfer Details (indicate cue type and reason): VC for pursed lip breathing and hand placement to maximize  safety Toileting- Clothing Manipulation and Hygiene: Set up;Sit to/from stand;Min guard         General ADL Comments: pt very limited due to drop in O2 sats during functional tasks     Vision Baseline Vision/History: Wears glasses Wears Glasses: Reading only Patient Visual Report: No change from baseline Vision Assessment?: No apparent visual deficits     Perception     Praxis      Pertinent Vitals/Pain Pain Assessment: 0-10 Pain Score: 5  Pain Location: headache Pain Descriptors / Indicators: Aching Pain Intervention(s): Limited activity within patient's tolerance;Monitored during session;Patient requesting pain meds-RN notified     Hand Dominance     Extremity/Trunk Assessment Upper Extremity Assessment Upper Extremity Assessment: Generalized weakness (grossly 4/5 bilaterally)   Lower Extremity Assessment Lower Extremity Assessment: Defer to PT evaluation;Generalized weakness (grossly 3+/5 bilaterally)   Cervical / Trunk Assessment Cervical / Trunk Assessment: Normal   Communication Communication Communication: No difficulties   Cognition Arousal/Alertness: Awake/alert Behavior During Therapy: WFL for tasks assessed/performed Overall Cognitive Status: Within Functional Limits for tasks assessed                                     General Comments       Exercises Other Exercises Other Exercises: Pt educated in pursed lip breathing and compensatory strategies to minimize SOB. Pt able to return demo technique with occasional verbal cues. RN in room to assess O2 sats after notified of decrease to 76% on 3L O2. Initially in recliner at rest while talking to OT on 2L O2, pt's sats dropped to 84% so increased to 3L O2 prior to mobility and toileting. Other Exercises: provided pt with handout on energy conservation strategies to maximize safety and functional independence. Briefly reviewed, would benefit from additional training/education to support  recall and carryover.   Shoulder Instructions      Home Living Family/patient expects to be discharged to:: Private residence Living Arrangements: Spouse/significant other Available Help at Discharge: Family Type of Home: House Home Access: Stairs to enter [employer] of Steps: 3-4 Entrance Stairs-Rails: Can reach both Home Layout: One level     Bathroom Shower/Tub: Tub/shower unit;Curtain   Biochemist, clinical: Standard     Home Equipment: Cane - single point;Walker - 2 wheels;Wheelchair - manual;Shower seat          Prior Functioning/Environment Level of Independence: Independent with assistive device(s)        Comments: Pt reports she was independent using SPC at baseline, able to ambulate household distances (endorses "furniture walking"). Indep with bladder mgt (endorses getting up almost every hour to use the bathroom overnight). Endorses 1 fall in past 12 months        OT Problem List: Cardiopulmonary status limiting activity;Decreased strength;Decreased safety awareness;Decreased activity tolerance;Impaired balance (sitting and/or standing);Decreased knowledge of use of DME or AE      OT Treatment/Interventions: Self-care/ADL training;Therapeutic exercise;Therapeutic activities;Energy conservation;DME and/or AE instruction;Patient/family education    OT Goals(Current goals can be found in the care plan section) Acute Rehab OT  Goals Patient Stated Goal: to get stronger OT Goal Formulation: With patient Time For Goal Achievement: 04/20/17 Potential to Achieve Goals: Good  OT Frequency: Min 1X/week   Barriers to D/C: Inaccessible home environment;Decreased caregiver support          Co-evaluation              AM-PAC PT "6 Clicks" Daily Activity     Outcome Measure Help from another person eating meals?: None Help from another person taking care of personal grooming?: A Little Help from another person toileting, which includes using toliet,  bedpan, or urinal?: A Little Help from another person bathing (including washing, rinsing, drying)?: A Lot Help from another person to put on and taking off regular upper body clothing?: A Little Help from another person to put on and taking off regular lower body clothing?: A Lot 6 Click Score: 17   End of Session Equipment Utilized During Treatment: Gait belt;Rolling walker;Oxygen Nurse Communication: Patient requests pain meds;Other (comment) (O2 sats throughout session)  Activity Tolerance: Other (comment) (limited by cardiopulmonary status) Patient left: in chair;with call bell/phone within reach;with chair alarm set  OT Visit Diagnosis: Other abnormalities of gait and mobility (R26.89);Muscle weakness (generalized) (M62.81);History of falling (Z91.81)                Time: 3976-7341 OT Time Calculation (min): 35 min Charges:  OT General Charges $OT Visit: 1 Visit OT Evaluation $OT Eval Low Complexity: 1 Low OT Treatments $Self Care/Home Management : 8-22 mins G-Codes: OT G-codes **NOT FOR INPATIENT CLASS** Functional Assessment Tool Used: AM-PAC 6 Clicks Daily Activity;Clinical judgement Functional Limitation: Self care Self Care Current Status (P3790): At least 40 percent but less than 60 percent impaired, limited or restricted Self Care Goal Status (W4097): At least 20 percent but less than 40 percent impaired, limited or restricted   Jeni Salles, MPH, MS, OTR/L ascom [phone] 04/06/17, 11:58 AM

## 2017-04-06 NOTE — Care Management Important Message (Signed)
Important Message  Patient Details  Name: Leah Shah MRN: [id number] Date of Birth: [birth year]   Medicare Important Message Given:  Yes    Beverly Sessions, RN 04/06/2017, 4:15 PM

## 2017-04-06 NOTE — NC FL2 (Signed)
[place] LEVEL OF CARE SCREENING TOOL     IDENTIFICATION  Patient Name: Leah Shah Birthdate: 29-Jun-1956 Sex: female Admission Date (Current Location): 04/03/2017  [place] and [place] Number:  Engineering geologist and Address:  [facility], [address], [place], [place] [address]      Provider Number: [id number]  Attending Physician Name and Address:  Fritzi Mandes, MD  Relative Name and Phone Number:  Derek [place] Sister   [phone] [address] [phone]  [phone] o     Current Level of Care: Hospital Recommended Level of Care: [place] Prior Approval Number:    Date Approved/Denied:   PASRR Number: [id number]  Discharge Plan: SNF    Current Diagnoses: Patient Active Problem List   Diagnosis Date Noted  . UTI (urinary tract infection) 04/03/2017  . PUD (peptic ulcer disease) 04/02/2017  . 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 Portal hypertensive gastropathy ([place]) 04/02/2017  . Abnormal ECG 02/16/2017  . Edema extremities 02/16/2017  . Family history of diabetes mellitus 02/16/2017  . Melanocytic nevus of skin 02/16/2017  . Spasm 02/16/2017  . Screening for thyroid disorder 02/16/2017  . Flu vaccine need 02/16/2017  . Hyperbilirubinemia 01/11/2017  . Diffuse nodular cirrhosis of liver ([place]) 01/11/2017  . Alkaline phosphatase elevation 01/11/2017  . COPD exacerbation ([place]) 09/08/2016  . Centrilobular emphysema ([place])   . Chronic diastolic CHF (congestive heart failure) ([place])   . Extreme obesity   . Hypertensive heart disease with heart failure ([place])   . Atrial fibrillation ([place]) 08/27/2016  . Congestive heart failure (CHF) ([place]) 08/27/2016  . Respiratory crackles at right lung base 04/29/2016  . Acute confusion following injury ([place]) 02/24/2016  . Hyperglycemia 12/12/2015  . Bilateral swelling of feet and ankles 11/13/2015  . 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 Dyslipidemia 06/16/2015  . MI (mitral incompetence) 06/16/2015  . DDD (degenerative disc disease), lumbar 06/07/2015  . Primary osteoarthritis of left knee 06/07/2015  . Status post right knee replacement 06/07/2015  . Acute right hip pain 05/16/2015  . Leg pain 04/22/2015  . Abnormal LFTs 04/15/2015  . BP (high blood pressure) 04/15/2015  . Reduced mobility 04/15/2015  . Chronic urticaria 04/15/2015  . Hyperlipidemia 03/14/2015  . Chronic pain of left knee 01/31/2015  . Gonalgia 01/10/2015  . Clinical depression 01/10/2015  . Benign hypertension 01/10/2015  . Cannot sleep 01/10/2015  . Current tobacco use 01/10/2015  . LBP (low back pain) 12/13/2014  . Continuous opioid dependence ([place]) 12/13/2014  . Anxiety 12/13/2014  . Cervical spine pain 12/13/2014  . Pain and swelling of left knee 12/13/2014    Orientation RESPIRATION BLADDER Height & Weight     Self, Time, Situation, Place  O2 (4L) Continent Weight: 273 lb 4.8 oz (124 kg) Height:  5\' 8"  (172.7 cm)  BEHAVIORAL SYMPTOMS/MOOD NEUROLOGICAL BOWEL NUTRITION STATUS      Continent Diet (2g sodium diet)  AMBULATORY STATUS COMMUNICATION OF NEEDS Skin   Limited Assist Verbally Normal                       Personal Care Assistance Level of Assistance  Feeding, Dressing, Bathing Bathing Assistance: Limited assistance Feeding assistance: Limited assistance Dressing Assistance: Limited assistance     Functional Limitations Info  Sight, Hearing, Speech Sight Info: Adequate Hearing Info: Adequate Speech Info: Adequate    SPECIAL CARE FACTORS FREQUENCY  PT (By licensed PT),  OT (By licensed OT)     PT Frequency: 5x a week OT Frequency: 5x a week            Contractures Contractures Info: Not present    Additional Factors Info  Code Status, Allergies, Psychotropic Code Status Info: Full Code Allergies Info: BACITRACIN-NEOMYCIN-POLYMYXIN, CODEINE,  GABAPENTIN, Neosporin  NEOMYCIN-BACITRACIN ZN-POLYMYX, PREGABALIN Psychotropic Info: citalopram (CELEXA) tablet 10 mg          Current Medications (04/06/2017):  This is the current hospital active medication list Current Facility-Administered Medications  Medication Dose Route Frequency Provider Last Rate Last Dose  . albuterol (PROVENTIL) (2.5 MG/3ML) 0.083% nebulizer solution 2.5 mg  2.5 mg Nebulization Q6H PRN Fritzi Mandes, MD      . albuterol (PROVENTIL) (2.5 MG/3ML) 0.083% nebulizer solution 2.5 mg  2.5 mg Inhalation Q6H PRN Claria Dice, Debby, MD   2.5 mg at 04/05/17 1426  . ALPRAZolam Duanne Moron) tablet 2 mg  2 mg Oral BID PRN Quintella Baton, MD      . aspirin EC tablet 81 mg  81 mg Oral Daily Crosley, Debby, MD   81 mg at 04/06/17 1036  . cephALEXin (KEFLEX) capsule 500 mg  500 mg Oral Q12H Fritzi Mandes, MD   500 mg at 04/06/17 1036  . citalopram (CELEXA) tablet 10 mg  10 mg Oral Daily Crosley, Debby, MD   10 mg at 04/06/17 1036  . furosemide (LASIX) injection 40 mg  40 mg Intravenous BID Fritzi Mandes, MD      . Derrill Memo ON 04/07/2017] furosemide (LASIX) tablet 80 mg  80 mg Oral Daily Fritzi Mandes, MD      . hydrOXYzine (ATARAX/VISTARIL) tablet 25 mg  25 mg Oral Q6H PRN Quintella Baton, MD   25 mg at 04/05/17 1549  . lactulose (CHRONULAC) 10 GM/15ML solution 10 g  10 g Oral BID Fritzi Mandes, MD   10 g at 04/06/17 1036  . magnesium oxide (MAG-OX) tablet 400 mg  400 mg Oral BID Claria Dice, Debby, MD   400 mg at 04/06/17 1036  . morphine 2 MG/ML injection 1 mg  1 mg Intravenous Q4H PRN Fritzi Mandes, MD      . ondansetron [facility]) tablet 4 mg  4 mg Oral Q6H PRN Fritzi Mandes, MD   4 mg at 04/05/17 1422   Or  . ondansetron (ZOFRAN) injection 4 mg  4 mg Intravenous Q6H PRN Fritzi Mandes, MD      . oxyCODONE (Oxy IR/ROXICODONE) immediate release tablet 5 mg  5 mg Oral Q4H PRN Fritzi Mandes, MD   5 mg at 04/06/17 1332  . pantoprazole (PROTONIX) EC tablet 40 mg  40 mg Oral Daily Crosley, Debby, MD   40 mg at 04/06/17  1036  . polyethylene glycol (MIRALAX / GLYCOLAX) packet 17 g  17 g Oral Daily PRN Fritzi Mandes, MD      . spironolactone (ALDACTONE) tablet 100 mg  100 mg Oral Daily Fritzi Mandes, MD   100 mg at 04/06/17 1036     Discharge Medications: Please see discharge summary for a list of discharge medications.  Relevant Imaging Results:  Relevant Lab Results:   Additional Information SSN [id number]  Ross Ludwig, [place]

## 2017-04-06 NOTE — Progress Notes (Signed)
[place] [facility] [place] NAME: Leah Shah    MR#:  [id number]  DATE OF BIRTH:  1955-08-15  SUBJECTIVE:   Came in with increasing shortness of breath. Patient feels a lot better.  good urine output. Some abdominal bloating. REVIEW OF SYSTEMS:   Review of Systems  Constitutional: Negative for chills, fever and weight loss.  HENT: Negative for ear discharge, ear pain and nosebleeds.   Eyes: Negative for blurred vision, pain and discharge.  Respiratory: Negative for sputum production, shortness of breath, wheezing and stridor.   Cardiovascular: Negative for chest pain, palpitations, orthopnea and PND.  Gastrointestinal: Negative for abdominal pain, diarrhea, nausea and vomiting.  Genitourinary: Negative for frequency and urgency.  Musculoskeletal: Negative for back pain and joint pain.  Neurological: Negative for sensory change, speech change, focal weakness and weakness.  Psychiatric/Behavioral: Negative for depression and hallucinations. The patient is not nervous/anxious.    Tolerating Diet:yes Tolerating PT: SNF  DRUG ALLERGIES:   Allergies  Allergen Reactions  . Bacitracin-Neomycin-Polymyxin Other (See Comments)  . Codeine Other (See Comments)    Itching "she can take some forms of it" Itching "she can take some forms of it"  . Gabapentin Nausea Only  . Neosporin  [Neomycin-Bacitracin Zn-Polymyx]   . Pregabalin Other (See Comments)    stomach issues stomach issues    VITALS:  Blood pressure (!) 152/59, pulse 88, temperature 97.8 F (36.6 C), temperature source Oral, resp. rate 16, height 5\' 8"  (1.727 m), weight 124 kg (273 lb 4.8 oz), SpO2 92 %.  PHYSICAL EXAMINATION:   Physical Exam  GENERAL:  61 y.o.-year-old patient lying in the bed with no acute distress.  EYES: Pupils equal, round, reactive to light and accommodation. No scleral icterus. Extraocular muscles intact.  HEENT: Head atraumatic, normocephalic.  Oropharynx and nasopharynx clear.  NECK:  Supple, no jugular venous distention. No thyroid enlargement, no tenderness.  LUNGS: Normal breath sounds bilaterally, no wheezing, rales, rhonchi. No use of accessory muscles of respiration.  CARDIOVASCULAR: S1, S2 normal. No murmurs, rubs, or gallops.  ABDOMEN: Soft, nontender, nondistended. Bowel sounds present. No organomegaly or mass.  EXTREMITIES: No cyanosis, clubbing or edema b/l.    NEUROLOGIC: Cranial nerves II through XII are intact. No focal Motor or sensory deficits b/l.   PSYCHIATRIC:  patient is alert and oriented x 3.  SKIN: No obvious rash, lesion, or ulcer.   LABORATORY PANEL:  CBC  Recent Labs Lab 04/05/17 0358  WBC 7.3  HGB 10.5*  HCT 29.6*  PLT 47*    Chemistries   Recent Labs Lab 04/02/17 1220  04/05/17 0358  NA 136  < > 136  K 5.0  < > 4.4  CL 103  < > 101  CO2 26  < > 31  GLUCOSE 136*  < > 156*  BUN 16  < > 26*  CREATININE 1.07*  < > 0.91  CALCIUM 9.2  < > 8.0*  MG 1.7  --   --   AST 49*  < > 38  ALT 30  < > 25  ALKPHOS 161*  < > 103  BILITOT 5.3*  < > 5.2*  < > = values in this interval not displayed. Cardiac Enzymes  Recent Labs Lab 04/03/17 1413  TROPONINI <0.03   RADIOLOGY:  US Abdomen Limited  Result Date: 04/05/2017 CLINICAL DATA:  Cirrhotic liver disease and ascites. Evaluation of ascites performed prior to possible paracentesis. EXAM: LIMITED ABDOMEN ULTRASOUND FOR ASCITES TECHNIQUE:  Limited ultrasound survey for ascites was performed in all four abdominal quadrants. COMPARISON:  CT of the abdomen on 04/03/2017 FINDINGS: Ultrasound demonstrates only a trace amount of ascites scattered between bowel loops in the peritoneal cavity. There is not enough fluid to allow paracentesis. IMPRESSION: Trace ascites in the peritoneal cavity. There was not enough fluid present to allow for paracentesis. Electronically Signed   By: Aletta Edouard M.D.   On: 04/05/2017 13:36   ASSESSMENT AND PLAN:  Leah  Shah a 61 y.o. femalewith a known history of cirrhosis of liver with portal hypertension and thrombocytopenia, chronic diastolic congestive heart failure, arthritis, anxiety comes to the emergency room accompanied by daughter and son with complaints of right flank pain. According to the daughter patient had reddish tinge to her urine. She denies any history of kidney stone.  * Hyperkalemia with EKG changes. Received Stat dose of D50 and insulin ,Lasix.  -Held patient's Aldactone and potassium supplements that she was taking at home. 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 Abundio Miu Liver biospy is held due to acute illness. Pt is advised to reschedule it -Continue Lasix IV 40 twice a day---change to oral  * Hypertension. Presently patient's blood pressure is low normal. Stop blood pressure medications.  * Portal hypertensive gastropathy and splenomegaly with thrombocytopenia secondary to #3 -Symptomatic management  * morbid obesity  * DVT prophylaxis SCDs and teds  * PT recommends rehab. Pt is clinically stable at present. Awaiting insurance authorization for rehab.  Case discussed with Care Management/Social Worker. Management plans discussed with the patient, family and they are in agreement.  CODE STATUS: FULL   TOTAL TIME TAKING CARE OF THIS PATIENT: *30 minutes.  >50% time spent on counselling and coordination of  care  POSSIBLE D/C IN *1-2* DAYS, DEPENDING ON CLINICAL CONDITION.  Note: This dictation was prepared with Dragon dictation along with smaller phrase technology. Any transcriptional errors that result from this process are unintentional.  Avelino Herren M.D on 04/06/2017 at 11:12 AM  Between 7am to 6pm - Pager - [phone]  After 6pm go to [email or web address] - password [email or web address] [facility]  Office  [phone]  CC: Primary care physician; Roselee Nova, MDPatient ID: Leah Shah, female   DOB: April 04, 1956, 61 y.o.   MRN: [id number]

## 2017-04-06 NOTE — Progress Notes (Signed)
SATURATION QUALIFICATIONS: (This note is used to comply with regulatory documentation for home oxygen)  Patient Saturations on Room Air at Rest = 84%  Patient Saturations on Room Air while Ambulating = n/a%  Patient Saturations on 3 Liters of oxygen while at rest = 92%  Please briefly explain why patient needs home oxygen:

## 2017-04-07 DIAGNOSIS — I5032 Chronic diastolic (congestive) heart failure: Secondary | ICD-10-CM | POA: Diagnosis not present

## 2017-04-07 DIAGNOSIS — I10 Essential (primary) hypertension: Secondary | ICD-10-CM | POA: Diagnosis not present

## 2017-04-07 DIAGNOSIS — I509 Heart failure, unspecified: Secondary | ICD-10-CM | POA: Diagnosis not present

## 2017-04-07 DIAGNOSIS — E875 Hyperkalemia: Secondary | ICD-10-CM | POA: Diagnosis not present

## 2017-04-07 DIAGNOSIS — J811 Chronic pulmonary edema: Secondary | ICD-10-CM | POA: Diagnosis not present

## 2017-04-07 DIAGNOSIS — K7460 Unspecified cirrhosis of liver: Secondary | ICD-10-CM | POA: Diagnosis not present

## 2017-04-07 DIAGNOSIS — Z9911 Dependence on respirator [ventilator] status: Secondary | ICD-10-CM | POA: Diagnosis not present

## 2017-04-07 DIAGNOSIS — Z743 Need for continuous supervision: Secondary | ICD-10-CM | POA: Diagnosis not present

## 2017-04-07 DIAGNOSIS — R278 Other lack of coordination: Secondary | ICD-10-CM | POA: Diagnosis not present

## 2017-04-07 DIAGNOSIS — N12 Tubulo-interstitial nephritis, not specified as acute or chronic: Secondary | ICD-10-CM | POA: Diagnosis not present

## 2017-04-07 DIAGNOSIS — R488 Other symbolic dysfunctions: Secondary | ICD-10-CM | POA: Diagnosis not present

## 2017-04-07 DIAGNOSIS — E876 Hypokalemia: Secondary | ICD-10-CM | POA: Diagnosis not present

## 2017-04-07 DIAGNOSIS — M6281 Muscle weakness (generalized): Secondary | ICD-10-CM | POA: Diagnosis not present

## 2017-04-07 DIAGNOSIS — N390 Urinary tract infection, site not specified: Secondary | ICD-10-CM | POA: Diagnosis not present

## 2017-04-07 DIAGNOSIS — I5033 Acute on chronic diastolic (congestive) heart failure: Secondary | ICD-10-CM | POA: Diagnosis not present

## 2017-04-07 DIAGNOSIS — R0600 Dyspnea, unspecified: Secondary | ICD-10-CM | POA: Diagnosis not present

## 2017-04-07 MED ORDER — POLYETHYLENE GLYCOL 3350 17 G PO PACK: 17.0000 g | PACK | Freq: Every day | ORAL | 0 refills | Status: AC | PRN

## 2017-04-07 MED ORDER — ALPRAZOLAM 2 MG PO TABS: 2.0000 mg | ORAL_TABLET | Freq: Two times a day (BID) | ORAL | 0 refills | Status: DC | PRN

## 2017-04-07 MED ORDER — OXYCODONE HCL 5 MG PO TABS: 5.0000 mg | ORAL_TABLET | Freq: Three times a day (TID) | ORAL | 0 refills | Status: DC | PRN

## 2017-04-07 MED ORDER — CEPHALEXIN 500 MG PO CAPS: 500.0000 mg | ORAL_CAPSULE | Freq: Two times a day (BID) | ORAL | 0 refills | Status: AC

## 2017-04-07 MED ORDER — LACTULOSE 10 GM/15ML PO SOLN
10.0000 g | Freq: Two times a day (BID) | ORAL | 0 refills | Status: AC
Start: 2017-04-07 — End: ?

## 2017-04-07 MED ORDER — SPIRONOLACTONE 100 MG PO TABS: 100.0000 mg | ORAL_TABLET | Freq: Every day | ORAL | 0 refills | Status: AC

## 2017-04-07 NOTE — Progress Notes (Signed)
Physical Therapy Treatment Patient Details Name: Leah Shah MRN: [id number] DOB: Apr 27, 1956 Today's Date: 04/07/2017    History of Present Illness Pt admitted for UTI. History includes liver cirrhosis, CHF, and anxiety. Pt currently on 3L of O2 at this time.    PT Comments    Pt requires min guard assist with bed mobility, transfers, and ambulation.  SpO2 drops as low as 78% on 3L O2 when ambulating and she requires ~ 2 minutes with standing rest break for SpO2 to improve.  Pt educated in proper posture, pursed lip breathing, and therapeutic exercise to improve her pulmonary function.  SNF remains appropriate d/c plan.     Follow Up Recommendations  SNF     Equipment Recommendations  Rolling walker with 5" wheels    Recommendations for Other Services       Precautions / Restrictions Precautions Precautions: Fall;Other (comment) Precaution Comments: Monitor O2 Restrictions Weight Bearing Restrictions: No    Mobility  Bed Mobility Overal bed mobility: Needs Assistance Bed Mobility: Supine to Sit     Supine to sit: Min guard Sit to supine: Supervision   General bed mobility comments: Increased time and effort.  No physical assist or cues needed.  Transfers Overall transfer level: Needs assistance Equipment used: Rolling walker (2 wheeled) Transfers: Sit to/from Stand Sit to Stand: Min guard         General transfer comment: Cues for proper hand placement and safe technique.  Min guard as pt demonstrates mild instability.   Ambulation/Gait Ambulation/Gait assistance: Min guard Ambulation Distance (Feet): 30 Feet Assistive device: Rolling walker (2 wheeled) Gait Pattern/deviations: Step-through pattern;Wide base of support Gait velocity: decreased Gait velocity interpretation: Below normal speed for age/gender General Gait Details: Pt ambulates with wide BOS but with slow and steady gait. Pt ambualted into bathroom to void and then ambulated to the chair.   When ambulating to the chair the pt's SpO2 dropped to 78% on 3L O2 and pt required ~2 minute standing rest break for SpO2 to improve to 85%.  SpO2 up to 89% on 3L O2 at end of session with pt sitting in chair.    Stairs            Wheelchair Mobility    Modified Rankin (Stroke Patients Only)       Balance Overall balance assessment: Needs assistance Sitting-balance support: Feet supported;No upper extremity supported Sitting balance-[name] Scale: Good     Standing balance support: Bilateral upper extremity supported;During functional activity Standing balance-[name] Scale: Fair Standing balance comment: Pt able to stand statically without UE support but requires UE support with dynamic activities                            Cognition Arousal/Alertness: Awake/alert Behavior During Therapy: Flat affect;WFL for tasks assessed/performed Overall Cognitive Status: Within Functional Limits for tasks assessed                                        Exercises Other Exercises Other Exercises: Pt encouraged to ambulate in room with nursing staff at least 3x/day Other Exercises: Cues and demonstration for pursed lip breathing throughout session.   Other Exercises: Encouraged pt to sit up during the day for improved pulmonary function.  Other Exercises: Encouraged pt to continue practicing pursed lip breathing throughout the day.  Other Exercises: Seated scapular squeezes in  sitting x10.     General Comments General comments (skin integrity, edema, etc.): 3L O2 on at all times      Pertinent Vitals/Pain Pain Assessment: No/denies pain    Home Living                      Prior Function            PT Goals (current goals can now be found in the care plan section) Acute Rehab PT Goals Patient Stated Goal: to get stronger PT Goal Formulation: With patient Time For Goal Achievement: 04/19/17 Potential to Achieve Goals: Good Progress towards  PT goals: Progressing toward goals    Frequency    Min 2X/week      PT Plan Current plan remains appropriate    Co-evaluation              AM-PAC PT "6 Clicks" Daily Activity  Outcome Measure  Difficulty turning over in bed (including adjusting bedclothes, sheets and blankets)?: None Difficulty moving from lying on back to sitting on the side of the bed? : A Little Difficulty sitting down on and standing up from a chair with arms (e.g., wheelchair, bedside commode, etc,.)?: A Lot Help needed moving to and from a bed to chair (including a wheelchair)?: A Little Help needed walking in hospital room?: A Little Help needed climbing 3-5 steps with a railing? : A Little 6 Click Score: 18    End of Session Equipment Utilized During Treatment: Gait belt;Oxygen Activity Tolerance: Treatment limited secondary to medical complications (Comment) (hypoxia) Patient left: in chair;with chair alarm set;with call bell/phone within reach Nurse Communication: Mobility status;Other (comment) (SpO2) PT Visit Diagnosis: Unsteadiness on feet (R26.81);Difficulty in walking, not elsewhere classified (R26.2);Pain Pain - Right/Left: Right Pain - part of body:  (abdomen)     Time: 0045-9977 PT Time Calculation (min) (ACUTE ONLY): 24 min  Charges:  $Gait Training: 8-22 mins $Therapeutic Activity: 8-22 mins                    G Codes:       Collie Siad PT, DPT 04/07/2017, 12:04 PM

## 2017-04-07 NOTE — Consult Note (Signed)
   [facility] Inpatient Consult   04/07/2017  Leah Shah August 06, 1955 [id number]   Patient screened for potential [place] Management services. Patient is on the [facility] registry as a benefit of their [employer] . Electronic medical record reveals patient's discharge plan is SNF. [facility] Care Management services not appropriate at this time. If patient's post hospital needs change please place a [facility] Care Management consult. For questions please contact:   Mayrani Khamis RN, [place] [facility] Liaison  [phone]) Business Mobile [phone]) Toll free office

## 2017-04-07 NOTE — Discharge Summary (Signed)
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 Anxiety   . Arthritis    R knee, L knee - OA  . Chronic diastolic CHF (congestive heart failure) ([place])    a. 06/2016 Echo: EF 60-65%, no rwma, mod MR, mildly dil LA, nl RV fxn, PASP 43mmHg.  [name] Cirrhosis ([place])   . Dysrhythmia    "mild arrythmia after taking Redux diet med years ago"no cardio fi  . Headache(784.0)   . History of stress test    a. 06/2016 MV: EF 67%, no ischemia/infarct.  . Hyperlipidemia   . Hypertension    echoJefm Bryant, wnl 2009? , had taken Redux for diet management  but now  off the market. pt, told that echo wnl.    . Moderate mitral regurgitation    a. 06/2016 Echo: EF 60-65%, mod MR.  . Morbid obesity ([place])   . Osteoarthritis   . Recurrent upper respiratory infection (URI)    treated /w OTC med.     HOSPITAL COURSE:   Leah Shah a 61 y.o. femalewith a known history of cirrhosis of liver with portal hypertension and thrombocytopenia, chronic diastolic congestive heart failure, arthritis, anxiety comes to the emergency room accompanied by daughter and son with complaints of right flank pain. According to the daughter patient had reddish tinge to her urine. She denies any history of kidney stone.  * Hyperkalemia with EKG changes. Received Stat dose of D50 and insulin ,Lasix.  -Held patient's Aldactone and potassium supplements that she was taking at home. 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 Abundio Miu Liver biospy is held due to acute illness. Pt is advised to reschedule it -Continue Lasix IV 40 twice a day---change to oral  * Hypertension. Presently patient's blood pressure is low normal. Stop blood pressure medications.  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 Pt is clinically stable at present. -d/c to rehab today CONSULTS OBTAINED:    DRUG ALLERGIES:   Allergies  Allergen Reactions  . Bacitracin-Neomycin-Polymyxin Other (See Comments)  . Codeine Other (See Comments)    Itching "she can take some forms of it" Itching "she can take some forms of it"  . Gabapentin Nausea Only  . Neosporin  [Neomycin-Bacitracin Zn-Polymyx]   . Pregabalin Other (See Comments)    stomach issues stomach issues    DISCHARGE MEDICATIONS:   Current Discharge  Medication List    START taking these medications   Details  cephALEXin (KEFLEX) 500 MG capsule Take 1 capsule (500 mg total) by mouth every 12 (twelve) hours. Qty: 8 capsule, Refills: 0    lactulose (CHRONULAC) 10 GM/15ML solution Take 15 mLs (10 g total) by mouth 2 (two) times daily. Qty: 240 mL, Refills: 0    polyethylene glycol (MIRALAX / GLYCOLAX) packet Take 17 g by mouth daily as needed for mild  constipation. Qty: 14 each, Refills: 0      CONTINUE these medications which have CHANGED   Details  spironolactone (ALDACTONE) 100 MG tablet Take 1 tablet (100 mg total) by mouth daily. Qty: 90 tablet, Refills: 0      CONTINUE these medications which have NOT CHANGED   Details  aspirin 81 MG EC tablet Take 1 tablet (81 mg total) by mouth daily. Qty: 30 tablet, Refills: 0    atenolol (TENORMIN) 100 MG tablet TAKE 1 TABLET (100 MG TOTAL) BY MOUTH DAILY. Qty: 90 tablet, Refills: 0   Associated Diagnoses: Benign hypertension    citalopram (CELEXA) 10 MG tablet Take 1 tablet (10 mg total) by mouth daily. Qty: 90 tablet, Refills: 0   Associated Diagnoses: Anxiety    cyclobenzaprine (FLEXERIL) 5 MG tablet TAKE ONE AND ONE-HALF TABLETS BY MOUTH EVERY NIGHT AT BEDTIME Qty: 90 tablet, Refills: 0   Associated Diagnoses: Chronic midline low back pain without sciatica    furosemide (LASIX) 40 MG tablet Take 2 tablets (80 mg total) by mouth daily. In the morning Qty: 180 tablet, Refills: 0   Associated Diagnoses: Bilateral swelling of feet and ankles    hydrOXYzine (ATARAX/VISTARIL) 25 MG tablet Take 1 tablet (25 mg total) by mouth every 8 (eight) hours as needed. Qty: 270 tablet, Refills: 0   Associated Diagnoses: H/O skin pruritus    Magnesium Oxide 400 (240 Mg) MG TABS TAKE 1 TABLET BY MOUTH TWICE A DAY Qty: 60 tablet, Refills: 0    omeprazole (PRILOSEC) 40 MG capsule Take 1 capsule (40 mg total) by mouth 2 (two) times daily before a meal. Qty: 180 capsule, Refills: 0    Vitamin D, Ergocalciferol, (DRISDOL) 50000 units CAPS capsule Take 1 capsule (50,000 Units total) by mouth every 7 (seven) days. Qty: 16 capsule, Refills: 0    albuterol (PROVENTIL HFA;VENTOLIN HFA) 108 (90 Base) MCG/ACT inhaler Inhale 2 puffs into the lungs every 6 (six) hours as needed for wheezing or shortness of breath. Qty: 1 Inhaler, Refills: 0    alprazolam (XANAX) 2 MG tablet Take 1 tablet (2 mg total) by  mouth 2 (two) times daily as needed for anxiety. Qty: 60 tablet, Refills: 0   Associated Diagnoses: Anxiety    oxyCODONE (OXY IR/ROXICODONE) 5 MG immediate release tablet Take 1 tablet (5 mg total) by mouth every 8 (eight) hours as needed for moderate pain. Qty: 90 tablet, Refills: 0   Associated Diagnoses: Chronic midline low back pain without sciatica      STOP taking these medications     amoxicillin (AMOXIL) 500 MG capsule      fluconazole (DIFLUCAN) 200 MG tablet      potassium chloride SA (K-DUR,KLOR-CON) 20 MEQ tablet         If you experience worsening of your admission symptoms, develop shortness of breath, life threatening emergency, suicidal or homicidal thoughts you must seek medical attention immediately by calling 911 or calling your MD immediately  if symptoms less severe.  You Must read complete instructions/literature along  with all the possible adverse reactions/side effects for all the Medicines you take and that have been prescribed to you. Take any new Medicines after you have completely understood and accept all the possible adverse reactions/side effects.   Please note  You were cared for by a hospitalist during your hospital stay. If you have any questions about your discharge medications or the care you received while you were in the hospital after you are discharged, you can call the unit and asked to speak with the hospitalist on call if the hospitalist that took care of you is not available. Once you are discharged, your primary care physician will handle any further medical issues. Please note that NO REFILLS for any discharge medications will be authorized once you are discharged, as it is imperative that you return to your primary care physician (or establish a relationship with a primary care physician if you do not have one) for your aftercare needs so that they can reassess your need for medications and monitor your lab values. Today   SUBJECTIVE    Doing well  VITAL SIGNS:  Blood pressure (!) 110/52, pulse 96, temperature 98.4 F (36.9 C), temperature source Oral, resp. rate 20, height 5\' 8"  (1.727 m), weight 129.5 kg (285 lb 9.6 oz), SpO2 95 %.  I/O:   Intake/Output Summary (Last 24 hours) at 04/07/17 0955 Last data filed at 04/07/17 0414  Gross per 24 hour  Intake              600 ml  Output             1200 ml  Net             -600 ml    PHYSICAL EXAMINATION:  GENERAL:  61 y.o.-year-old patient lying in the bed with no acute distress. obese EYES: Pupils equal, round, reactive to light and accommodation. No scleral icterus. Extraocular muscles intact.  HEENT: Head atraumatic, normocephalic. Oropharynx and nasopharynx clear.  NECK:  Supple, no jugular venous distention. No thyroid enlargement, no tenderness.  LUNGS: Normal breath sounds bilaterally, no wheezing, rales,rhonchi or crepitation. No use of accessory muscles of respiration.  CARDIOVASCULAR: S1, S2 normal. No murmurs, rubs, or gallops.  ABDOMEN: Soft, non-tender, non-distended. Bowel sounds present. No organomegaly or mass.  EXTREMITIES: + pedal edema,no cyanosis, or clubbing.  NEUROLOGIC: Cranial nerves II through XII are intact. Muscle strength 5/5 in all extremities. Sensation intact. Gait not checked.  PSYCHIATRIC: The patient is alert and oriented x 3.  SKIN: No obvious rash, lesion, or ulcer.   DATA REVIEW:   CBC   Recent Labs Lab 04/05/17 0358  WBC 7.3  HGB 10.5*  HCT 29.6*  PLT 47*    Chemistries   Recent Labs Lab 04/02/17 1220  04/05/17 0358  NA 136  < > 136  K 5.0  < > 4.4  CL 103  < > 101  CO2 26  < > 31  GLUCOSE 136*  < > 156*  BUN 16  < > 26*  CREATININE 1.07*  < > 0.91  CALCIUM 9.2  < > 8.0*  MG 1.7  --   --   AST 49*  < > 38  ALT 30  < > 25  ALKPHOS 161*  < > 103  BILITOT 5.3*  < > 5.2*  < > = values in this interval not displayed.  Microbiology Results   No results found for this or any previous visit (from the past  240 hour(s)).  RADIOLOGY:  US Abdomen Limited  Result Date: 04/05/2017 CLINICAL DATA:  Cirrhotic liver disease and ascites. Evaluation of ascites performed prior to possible paracentesis. EXAM: LIMITED ABDOMEN ULTRASOUND FOR ASCITES TECHNIQUE: Limited ultrasound survey for ascites was performed in all four abdominal quadrants. COMPARISON:  CT of the abdomen on 04/03/2017 FINDINGS: Ultrasound demonstrates only a trace amount of ascites scattered between bowel loops in the peritoneal cavity. There is not enough fluid to allow paracentesis. IMPRESSION: Trace ascites in the peritoneal cavity. There was not enough fluid present to allow for paracentesis. Electronically Signed   By: Aletta Edouard M.D.   On: 04/05/2017 13:36     Management plans discussed with the patient, family and they are in agreement.  CODE STATUS:     Code Status Orders        Start     Ordered   04/03/17 1845  Full code  Continuous     04/03/17 1846    Code Status History    Date Active Date Inactive Code Status Order ID Comments User Context   08/27/2016 11:09 PM 08/29/2016  3:22 PM Full Code [id number]  Lance Coon, MD ED   08/27/2011  9:08 PM 08/28/2011  1:39 PM Full Code [id number]  Lodema Hong, RN Inpatient   07/28/2011  3:28 PM 08/01/2011  6:16 PM Full Code [id number]  Aniceto Boss, RN Inpatient      TOTAL TIME TAKING CARE OF THIS PATIENT: 40  minutes.    Honesty Menta M.D on 04/07/2017 at 9:55 AM  Between 7am to 6pm - Pager - [phone] After 6pm go to [email or web address] - password [email or web address] [place] [facility]  Office  [phone]  CC: Primary care physician; Roselee Nova, MD

## 2017-04-07 NOTE — Progress Notes (Signed)
Occupational Therapy Treatment Patient Details Name: Leah Shah MRN: [id number] DOB: [birth year] Today's Date: 04/07/2017    History of present illness Pt admitted for UTI. History includes liver cirrhosis, CHF, and anxiety.    OT comments  Pt seen for OT treatment this date. Pt seated on toilet upon entry into room. Independent with hygiene, min guard for clothing mgt, transfers, and ambulation back to bed with SPC and 3L O2 on at all times. Pt slightly SOB and fatigued once seated EOB again. O2 sats 79%, HR 137. VC for pursed lip breathing with O2 sats improving within 1 minute to 91% on 3L O2, HR 125. Supervision for sit>supine back to bed with HOB elevated with additional verbal cues to utilize pursed lip breathing. Once back in bed, O2 sats 89-90% on 3L O2, HR 119. Pt reported no headache, lightheadedness, or SOB throughout session. Pt educated in AE/DME, benefits of a wedge pillow, energy conservation strategies to support functional independence and minimize falls risk and SOB. Pt verbalized understanding of all education provided via verbal instruction, visual demonstration, and handout. Pt making progress but continues to be limited by cardiopulmonary status with functional mobility and ADL tasks.   Follow Up Recommendations  SNF    Equipment Recommendations  3 in 1 bedside commode;Other (comment) (reacher, tub rail, wedge pillow)    Recommendations for Other Services      Precautions / Restrictions Precautions Precautions: Fall Precaution Comments: monitor heart rate and O2 sats Restrictions Weight Bearing Restrictions: No       Mobility Bed Mobility Overal bed mobility: Needs Assistance Bed Mobility: Sit to Supine       Sit to supine: Supervision      Transfers Overall transfer level: Needs assistance Equipment used: Straight cane Transfers: Sit to/from Stand Sit to Stand: Min guard         General transfer comment: min guard with SPC for transfer,  increased effort to perform    Balance Overall balance assessment: Needs assistance Sitting-balance support: Feet supported Sitting balance-[name] Scale: Good     Standing balance support: Single extremity supported Standing balance-[name] Scale: Good                             ADL either performed or assessed with clinical judgement   ADL Overall ADL's : Needs assistance/impaired Eating/Feeding: Set up;Sitting   Grooming: Standing;Min guard;Wash/dry Teacher, music: Min guard;Ambulation;Regular Toilet;Grab bars [facility]) Toilet Transfer Details (indicate cue type and reason): VC for pursed lip breathing and hand placement to maximize safety Toileting- Clothing Manipulation and Hygiene: Sitting/lateral lean;Independent       Functional mobility during ADLs: Min guard;Cane       Vision Baseline Vision/History: Wears glasses Wears Glasses: Reading only Patient Visual Report: No change from baseline Vision Assessment?: No apparent visual deficits   Perception     Praxis      Cognition Arousal/Alertness: Awake/alert Behavior During Therapy: WFL for tasks assessed/performed Overall Cognitive Status: Within Functional Limits for tasks assessed                                          Exercises Other Exercises Other Exercises: Pt educated in benefits of head elevation with wedge pillow to improve breathing and  minimize SOB.  Other Exercises: Reviewed ECS handout from previous date with emphasis on PLB, seated rest breaks, work simplification, and home/routines modifications to maximize safety and functional independence while minimizing SOB.   Shoulder Instructions       General Comments 3L O2 on at all times    Pertinent Vitals/ Pain       Pain Assessment: No/denies pain  Home Living                                          Prior Functioning/Environment              Frequency  Min  1X/week        Progress Toward Goals  OT Goals(current goals can now be found in the care plan section)  Progress towards OT goals: Progressing toward goals  Acute Rehab OT Goals Patient Stated Goal: to get stronger OT Goal Formulation: With patient Time For Goal Achievement: 04/20/17 Potential to Achieve Goals: Good  Plan Discharge plan remains appropriate;Frequency remains appropriate    Co-evaluation                 AM-PAC PT "6 Clicks" Daily Activity     Outcome Measure   Help from another person eating meals?: None Help from another person taking care of personal grooming?: A Little Help from another person toileting, which includes using toliet, bedpan, or urinal?: A Little Help from another person bathing (including washing, rinsing, drying)?: A Lot Help from another person to put on and taking off regular upper body clothing?: A Little Help from another person to put on and taking off regular lower body clothing?: A Little 6 Click Score: 18    End of Session Equipment Utilized During Treatment: Oxygen (3L O2)  OT Visit Diagnosis: Other abnormalities of gait and mobility (R26.89);Muscle weakness (generalized) (M62.81);History of falling (Z91.81)   Activity Tolerance Patient tolerated treatment well   Patient Left in bed;with call bell/phone within reach;with bed alarm set   Nurse Communication          Time: 7262-0355 OT Time Calculation (min): 26 min  Charges: OT General Charges $OT Visit: 1 Visit OT Treatments $Self Care/Home Management : 23-37 mins  Jeni Salles, MPH, MS, OTR/L ascom [phone] 04/07/17, 11:38 AM

## 2017-04-07 NOTE — Clinical Social Work Placement (Signed)
   CLINICAL SOCIAL WORK PLACEMENT  NOTE  Date:  04/07/2017  Patient Details  Name: Leah Shah MRN: [id number] Date of Birth: [birth year]  Clinical Social Work is seeking post-discharge placement for this patient at the [place] level of care (*CSW will initial, date and re-position this form in  chart as items are completed):  Yes   Patient/family provided with [facility]'s list of facilities offering this level of care within the geographic area requested by the patient (or if unable, by the patient's family).  Yes   Patient/family informed of their freedom to choose among providers that offer the needed level of care, that participate in Medicare, Medicaid or managed care program needed by the patient, have an available bed and are willing to accept the patient.  Yes   Patient/family informed of [facility]'s ownership interest in [facility] and [facility], as well as of the fact that they are under no obligation to receive care at these facilities.  PASRR submitted to EDS on 04/05/17     PASRR number received on 04/05/17     Existing PASRR number confirmed on       FL2 transmitted to all facilities in geographic area requested by pt/family on 04/05/17     FL2 transmitted to all facilities within larger geographic area on 04/05/17     Patient informed that his/her managed care company has contracts with or will negotiate with certain facilities, including the following:        Yes   Patient/family informed of bed offers received.  Patient chooses bed at [facility]     Physician recommends and patient chooses bed at      Patient to be transferred to [facility] on 04/07/17.  Patient to be transferred to facility by [facility]     Patient family notified on 04/07/17 of transfer.  Name of family member notified:  Patient notified her sister and husband.     PHYSICIAN Please sign FL2     Additional  Comment:    _______________________________________________ Ross Ludwig, LCSWA 04/07/2017, 5:24 PM

## 2017-04-07 NOTE — Progress Notes (Signed)
Pt is ready to be discharged to a skilled nursing care, but aren't could not find the heart prescription for oxycodone and Xanax, requested me to print them again; I have reprinted oxycodone 10 tablets and Xanax 10 tablets with no refills

## 2017-04-07 NOTE — Progress Notes (Signed)
Report given to Taffy, Rn at [facility], pt left via stretcher with EMS in stable condition on 3L of o2 via  with no s/s of distress or discomfort. IV catheters discontinued, tip intact, telemetry monitor removed. Loma Sousa Javin Nong,RN

## 2017-04-07 NOTE — Clinical Social Work Note (Signed)
Patient to be d/c'ed today to [facility].  Patient and family agreeable to plans will transport via ems RN to call report [phone].  Evette Cristal, MSW, [place]

## 2017-04-08 DIAGNOSIS — K7460 Unspecified cirrhosis of liver: Secondary | ICD-10-CM | POA: Diagnosis not present

## 2017-04-08 DIAGNOSIS — N12 Tubulo-interstitial nephritis, not specified as acute or chronic: Secondary | ICD-10-CM | POA: Diagnosis not present

## 2017-04-08 DIAGNOSIS — I10 Essential (primary) hypertension: Secondary | ICD-10-CM | POA: Diagnosis not present

## 2017-04-08 DIAGNOSIS — I509 Heart failure, unspecified: Secondary | ICD-10-CM | POA: Diagnosis not present

## 2017-04-12 ENCOUNTER — Telehealth: Payer: Self-pay | Admitting: Family

## 2017-04-12 ENCOUNTER — Ambulatory Visit: Payer: Medicare HMO | Admitting: Family

## 2017-04-12 DIAGNOSIS — I509 Heart failure, unspecified: Secondary | ICD-10-CM | POA: Diagnosis not present

## 2017-04-12 DIAGNOSIS — N12 Tubulo-interstitial nephritis, not specified as acute or chronic: Secondary | ICD-10-CM | POA: Diagnosis not present

## 2017-04-12 DIAGNOSIS — K7460 Unspecified cirrhosis of liver: Secondary | ICD-10-CM | POA: Diagnosis not present

## 2017-04-12 NOTE — Telephone Encounter (Signed)
Patient missed her initial appointment at the [facility] on 04/12/17. Will attempt to reschedule.

## 2017-04-14 ENCOUNTER — Encounter: Payer: Self-pay | Admitting: Gastroenterology

## 2017-04-14 DIAGNOSIS — N12 Tubulo-interstitial nephritis, not specified as acute or chronic: Secondary | ICD-10-CM | POA: Diagnosis not present

## 2017-04-14 DIAGNOSIS — K7460 Unspecified cirrhosis of liver: Secondary | ICD-10-CM | POA: Diagnosis not present

## 2017-04-14 DIAGNOSIS — I509 Heart failure, unspecified: Secondary | ICD-10-CM | POA: Diagnosis not present

## 2017-04-14 DIAGNOSIS — E876 Hypokalemia: Secondary | ICD-10-CM | POA: Diagnosis not present

## 2017-04-14 NOTE — Telephone Encounter (Signed)
error 

## 2017-04-16 DIAGNOSIS — I509 Heart failure, unspecified: Secondary | ICD-10-CM | POA: Diagnosis not present

## 2017-04-16 DIAGNOSIS — K7460 Unspecified cirrhosis of liver: Secondary | ICD-10-CM | POA: Diagnosis not present

## 2017-04-16 DIAGNOSIS — N12 Tubulo-interstitial nephritis, not specified as acute or chronic: Secondary | ICD-10-CM | POA: Diagnosis not present

## 2017-04-16 DIAGNOSIS — I10 Essential (primary) hypertension: Secondary | ICD-10-CM | POA: Diagnosis not present

## 2017-04-19 ENCOUNTER — Ambulatory Visit (INDEPENDENT_AMBULATORY_CARE_PROVIDER_SITE_OTHER): Payer: Medicare HMO | Admitting: Family Medicine

## 2017-04-19 ENCOUNTER — Encounter: Payer: Self-pay | Admitting: Family Medicine

## 2017-04-19 VITALS — BP 120/84 | HR 74 | Temp 97.6°F | Resp 16 | Ht 68.0 in | Wt 276.2 lb

## 2017-04-19 DIAGNOSIS — G8929 Other chronic pain: Secondary | ICD-10-CM

## 2017-04-19 DIAGNOSIS — K7460 Unspecified cirrhosis of liver: Secondary | ICD-10-CM

## 2017-04-19 DIAGNOSIS — I5033 Acute on chronic diastolic (congestive) heart failure: Secondary | ICD-10-CM | POA: Diagnosis not present

## 2017-04-19 DIAGNOSIS — F419 Anxiety disorder, unspecified: Secondary | ICD-10-CM

## 2017-04-19 DIAGNOSIS — Z23 Encounter for immunization: Secondary | ICD-10-CM

## 2017-04-19 DIAGNOSIS — N12 Tubulo-interstitial nephritis, not specified as acute or chronic: Secondary | ICD-10-CM | POA: Diagnosis not present

## 2017-04-19 DIAGNOSIS — M545 Low back pain: Secondary | ICD-10-CM | POA: Diagnosis not present

## 2017-04-19 MED ORDER — OXYCODONE HCL 5 MG PO TABS: 5.0000 mg | ORAL_TABLET | Freq: Three times a day (TID) | ORAL | 0 refills | Status: AC | PRN

## 2017-04-19 MED ORDER — ALPRAZOLAM 2 MG PO TABS: 2.0000 mg | ORAL_TABLET | Freq: Two times a day (BID) | ORAL | 0 refills | Status: DC | PRN

## 2017-04-19 MED ORDER — OXYCODONE HCL 5 MG PO TABS: 5.0000 mg | ORAL_TABLET | Freq: Three times a day (TID) | ORAL | 0 refills | Status: DC | PRN

## 2017-04-19 NOTE — Progress Notes (Signed)
Name: Leah Shah   MRN: [id number]    DOB: Mar 09, 1956   Date:04/19/2017       Progress Note  Subjective  Chief Complaint  Chief Complaint  Patient presents with  . Hospitalization Follow-up    Fluid on lungs and UTI. Pt denies any symptoms of UTI  . Immunizations    HEP B  . Flu Vaccine  . Medication Refill    pain meds and xanax med    Back Pain  This is a chronic problem. The problem is unchanged. The pain is present in the sacro-iliac and lumbar spine. The pain is at a severity of 6/10. The pain is moderate. The symptoms are aggravated by lying down and position. She has tried analgesics for the symptoms.  Anxiety  Presents for follow-up visit. Symptoms include depressed mood, excessive worry, nervous/anxious behavior and panic. The severity of symptoms is moderate and causing significant distress.     Pt. Presents for follow up from hospital where she was admitted for Pyelonephritis and acute on chronic Diastolic CHF in the setting of cirrhosis of the liver. She feels well today, had a follow up scheduled with Heart Failure clinic but canceled it and now needs to reschedule. She is on Lasix and Spironolactone.  In addition, pyelonephritis has resolved, she is urinating as usual.   Past Medical History:  Diagnosis Date  . Anxiety   . Arthritis    R knee, L knee - OA  . Chronic diastolic CHF (congestive heart failure) ([place])    a. 06/2016 Echo: EF 60-65%, no rwma, mod MR, mildly dil LA, nl RV fxn, PASP 78mHg.  .[place]rhosis [place]   . Dysrhythmia    "mild arrythmia after taking Redux diet med years ago"no cardio fi  . Headache(784.0)   . History of stress test    a. 06/2016 MV: EF 67%, no ischemia/infarct.  . Hyperlipidemia   . Hypertension    echo-[name] wnl 2009? , had taken Redux for diet management  but now  off the market. pt, told that echo wnl.    . Moderate mitral regurgitation    a. 06/2016 Echo: EF 60-65%, mod MR.  . Morbid obesity ([place]   .  Osteoarthritis   . Recurrent upper respiratory infection (URI)    treated /w OTC med.     Past Surgical History:  Procedure Laterality Date  . ABDOMINAL HYSTERECTOMY     partial-uterus removed  . CHOLECYSTECTOMY     2011  . COCCYX REMOVAL     [facility] . COLONOSCOPY WITH PROPOFOL N/A 03/11/2017   Procedure: COLONOSCOPY WITH PROPOFOL;  Surgeon: VLin Landsman MD;  [facility]  Service: Gastroenterology;  Laterality: N/A;  . ESOPHAGOGASTRODUODENOSCOPY (EGD) WITH PROPOFOL N/A 03/11/2017   Procedure: ESOPHAGOGASTRODUODENOSCOPY (EGD) WITH PROPOFOL;  Surgeon: VLin Landsman MD;  [facility];  Service: Gastroenterology;  Laterality: N/A;  . EYE SURGERY     states both lens replaced. Not sure if cataract surgery or not.  .Marland KitchenHEEL SPUR SURGERY    . I&D EXTREMITY  08/27/2011   Procedure: IRRIGATION AND DEBRIDEMENT EXTREMITY;  Surgeon: GMeredith Pel MD;  [place]  Service: Orthopedics;  Laterality: Right;  . JOINT REPLACEMENT Right 2012   Right TKR  . KNEE ARTHROPLASTY  07/28/2011   Procedure: COMPUTER ASSISTED TOTAL KNEE ARTHROPLASTY;  Surgeon: GMeredith Pel MD;  [place] [facility] [place]  Service: Orthopedics;  Laterality: Right;  right total knee arthroplasty, computer assisted  .  KNEE ARTHROSCOPY     right knee  . TMJ ARTHROPLASTY     [facility]- 1997    Family History  Problem Relation Age of Onset  . Heart disease Mother   . Hypertension Mother   . Hypertension Father   . Heart disease Sister 39       stent   . Heart attack Sister   . Hyperlipidemia Sister   . Hypertension Sister   . Heart attack Maternal Uncle 64  . Heart disease Maternal Uncle        CABG    Social History   Social History  . Marital status: Married    Spouse name: N/A  . Number of children: N/A  . Years of education: N/A   Occupational History  . Not on file.   Social History Main Topics  . Smoking status: Former Smoker    Packs/day: 0.25    Years: 50.00     Types: Cigarettes    Quit date: 03/20/2017  . Smokeless tobacco: Never Used  . Alcohol use No  . Drug use: No  . Sexual activity: Not Currently   Other Topics Concern  . Not on file   Social History Narrative   Lives in [place] with husband.  Does not routinely exercise.     Current Outpatient Prescriptions:  .  albuterol (PROVENTIL HFA;VENTOLIN HFA) 108 (90 Base) MCG/ACT inhaler, Inhale 2 puffs into the lungs every 6 (six) hours as needed for wheezing or shortness of breath., Disp: 1 Inhaler, Rfl: 0 .  alprazolam (XANAX) 2 MG tablet, Take 1 tablet (2 mg total) by mouth 2 (two) times daily as needed for anxiety., Disp: 10 tablet, Rfl: 0 .  aspirin 81 MG EC tablet, Take 1 tablet (81 mg total) by mouth daily., Disp: 30 tablet, Rfl: 0 .  atenolol (TENORMIN) 100 MG tablet, TAKE 1 TABLET (100 MG TOTAL) BY MOUTH DAILY., Disp: 90 tablet, Rfl: 0 .  citalopram (CELEXA) 10 MG tablet, Take 1 tablet (10 mg total) by mouth daily., Disp: 90 tablet, Rfl: 0 .  cyclobenzaprine (FLEXERIL) 5 MG tablet, TAKE ONE AND ONE-HALF TABLETS BY MOUTH EVERY NIGHT AT BEDTIME, Disp: 90 tablet, Rfl: 0 .  furosemide (LASIX) 40 MG tablet, Take 2 tablets (80 mg total) by mouth daily. In the morning, Disp: 180 tablet, Rfl: 0 .  hydrOXYzine (ATARAX/VISTARIL) 25 MG tablet, Take 1 tablet (25 mg total) by mouth every 8 (eight) hours as needed., Disp: 270 tablet, Rfl: 0 .  KLOR-CON M20 20 MEQ tablet, Take 60 mEq by mouth daily., Disp: , Rfl: 1 .  lactulose (CHRONULAC) 10 GM/15ML solution, Take 15 mLs (10 g total) by mouth 2 (two) times daily., Disp: 240 mL, Rfl: 0 .  Magnesium Oxide 400 (240 Mg) MG TABS, TAKE 1 TABLET BY MOUTH TWICE A DAY, Disp: 60 tablet, Rfl: 0 .  omeprazole (PRILOSEC) 40 MG capsule, Take 1 capsule (40 mg total) by mouth 2 (two) times daily before a meal., Disp: 180 capsule, Rfl: 0 .  oxyCODONE (OXY IR/ROXICODONE) 5 MG immediate release tablet, Take 1 tablet (5 mg total) by mouth every 8 (eight) hours  as needed for moderate pain., Disp: 10 tablet, Rfl: 0 .  spironolactone (ALDACTONE) 100 MG tablet, Take 1 tablet (100 mg total) by mouth daily., Disp: 90 tablet, Rfl: 0 .  Vitamin D, Ergocalciferol, (DRISDOL) 50000 units CAPS capsule, Take 1 capsule (50,000 Units total) by mouth every 7 (seven) days., Disp: 16 capsule, Rfl: 0 .  cephALEXin (  KEFLEX) 500 MG capsule, Take 1 capsule (500 mg total) by mouth every 12 (twelve) hours. (Patient not taking: Reported on 04/19/2017), Disp: 8 capsule, Rfl: 0 .  polyethylene glycol (MIRALAX / GLYCOLAX) packet, Take 17 g by mouth daily as needed for mild constipation. (Patient not taking: Reported on 04/19/2017), Disp: 14 each, Rfl: 0 .  traZODone (DESYREL) 150 MG tablet, Take 1 tablet by mouth daily as needed., Disp: , Rfl:   Allergies  Allergen Reactions  . Bacitracin-Neomycin-Polymyxin Other (See Comments)  . Codeine Other (See Comments)    Itching "she can take some forms of it" Itching "she can take some forms of it"  . Gabapentin Nausea Only  . Neosporin  [Neomycin-Bacitracin Zn-Polymyx]   . Pregabalin Other (See Comments)    stomach issues stomach issues     Review of Systems  Musculoskeletal: Positive for back pain.  Psychiatric/Behavioral: The patient is nervous/anxious.      Objective  Vitals:   04/19/17 1331  BP: 120/84  Pulse: 74  Resp: 16  Temp: 97.6 F (36.4 C)  TempSrc: Oral  SpO2: 98%  Weight: 276 lb 3.2 oz (125.3 kg)  Height: '5\' 8"'  (1.727 m)    Physical Exam  Constitutional: She is oriented to person, place, and time and well-developed, well-nourished, and in no distress.  HENT:  Head: Normocephalic and atraumatic.  Cardiovascular: Normal rate, regular rhythm and normal heart sounds.   No murmur heard. Pulmonary/Chest: Effort normal and breath sounds normal. She has no wheezes.  Abdominal: Soft. Bowel sounds are normal. There is no tenderness.  Musculoskeletal: She exhibits edema.       Lumbar back: She exhibits  tenderness, pain and spasm.       Back:  Neurological: She is alert and oriented to person, place, and time.  Psychiatric: Mood, memory, affect and judgment normal.  Nursing note and vitals reviewed.      Recent Results (from the past 2160 hour(s))  Ferritin     Status: None   Collection Time: 02/26/17  4:45 PM  Result Value Ref Range   Ferritin 124 11 - 307 ng/mL  Iron and TIBC     Status: Abnormal   Collection Time: 02/26/17  4:45 PM  Result Value Ref Range   Iron 212 (H) 28 - 170 ug/dL   TIBC 241 (L) 250 - 450 ug/dL   Saturation Ratios 88 (H) 10.4 - 31.8 %   UIBC 29 ug/dL  Mitochondrial antibodies     Status: None   Collection Time: 02/26/17  4:45 PM  Result Value Ref Range   Mitochondrial M2 Ab, IgG 10.2 0.0 - 20.0 Units    Comment: (NOTE)                                Negative    0.0 - 20.0                                Equivocal  20.1 - 24.9                                Positive         >24.9 Mitochondrial (M2) Antibodies are found in 90-96% of patients with primary biliary cirrhosis. Performed At: [facility] [place], [place] [id number] Lindon Romp MD  OX:[id number]   Anti-smooth muscle antibody, IgG     Status: None   Collection Time: 02/26/17  4:45 PM  Result Value Ref Range   F-Actin IgG 19 0 - 19 Units    Comment: (NOTE)                 Negative                     0 - 19                 Weak positive               20 - 30                 Moderate to strong positive     >30 Actin Antibodies are found in 52-85% of patients with autoimmune hepatitis or chronic active hepatitis and in 22% of patients with primary biliary cirrhosis. Performed At: [facility] [place], [place] [id number] Lindon Romp MD [id number]   VITAMIN D 25 Hydroxy (Vit-D Deficiency, Fractures)     Status: Abnormal   Collection Time: 02/26/17  4:45 PM  Result Value Ref Range   Vit D, 25-Hydroxy 6.1 (L) 30.0 - 100.0 ng/mL     Comment: (NOTE) Vitamin D deficiency has been defined by the [place] practice guideline as a level of serum 25-OH vitamin D less than 20 ng/mL (1,2). The Endocrine Society went on to further define vitamin D insufficiency as a level between 21 and 29 ng/mL (2). 1. IOM (Institute of Medicine). 2010. Dietary reference   intakes for calcium and D. [place]: The   [employer]. 2. Holick MF, Binkley , Bischoff-Ferrari HA, et al.   Evaluation, treatment, and prevention of vitamin D   deficiency: an Endocrine Society clinical practice   guideline. JCEM. 2011 Jul; 96(7):1911-30. Performed At: [facility] [place], [place] [id number] Lindon Romp MD EH:[id number]   Hepatitis B surface antibody     Status: Abnormal   Collection Time: 02/26/17  4:45 PM  Result Value Ref Range   Hepatitis B-Post <3.1 (L) Immunity>9.9 mIU/mL    Comment: (NOTE)  Status of Immunity                     Anti-HBs Level  ------------------                     -------------- Inconsistent with Immunity                   0.0 - 9.9 Consistent with Immunity                          >9.9 Performed At: [facility] [address] [place], [place] [id number] Lindon Romp MD [id number]   Hepatitis A antibody, total     Status: Abnormal   Collection Time: 02/26/17  4:45 PM  Result Value Ref Range   Hep A Total Ab Positive (A) Negative    Comment: (NOTE) Performed At: [facility] [place], [place] [id number] Lindon Romp MD ZM:[id number]   Tissue transglutaminase, IgA     Status: None   Collection Time: 02/26/17  4:45 PM  Result Value Ref Range   Tissue Transglutaminase Ab, IgA 2 0 - 3 U/mL    Comment: (NOTE)  Negative        0 -  3                              Weak Positive   4 - 10                              Positive           >10 Tissue Transglutaminase  (tTG) has been identified as the endomysial antigen.  Studies have demonstr- ated that endomysial IgA antibodies have over 99% specificity for gluten sensitive enteropathy. Performed At: [facility] [place], [place] [id number] Lindon Romp MD [id number]   Tissue transglutaminase, IgG     Status: None   Collection Time: 02/26/17  4:45 PM  Result Value Ref Range   Tissue Transglut Ab <2 0 - 5 U/mL    Comment: (NOTE)                              Negative        0 - 5                              Weak Positive   6 - 9                              Positive           >9 Performed At: [facility] [place], [place] [id number] Lindon Romp MD [id number]   ANA Comprehensive Panel     Status: None   Collection Time: 02/26/17  4:45 PM  Result Value Ref Range   ds DNA Ab 3 0 - 9 IU/mL    Comment: (NOTE)                                   Negative      <5                                   Equivocal  5 - 9                                   Positive      >9    Ribonucleic Protein <0.2 0.0 - 0.9 AI   ENA SM Ab Ser-aCnc <0.2 0.0 - 0.9 AI   Scleroderma (Scl-70) (ENA) Antibody, IgG <0.2 0.0 - 0.9 AI   SSA (Ro) (ENA) Antibody, IgG <0.2 0.0 - 0.9 AI   SSB (La) (ENA) Antibody, IgG <0.2 0.0 - 0.9 AI   Chromatin Ab SerPl-aCnc <0.2 0.0 - 0.9 AI   Anti JO-1 <0.2 0.0 - 0.9 AI   Centromere Ab Screen <0.2 0.0 - 0.9 AI   See below: Comment     Comment: (NOTE) Autoantibody                       Disease Association ------------------------------------------------------------  Condition                  Frequency ---------------------   ------------------------   --------- Antinuclear Antibody,    SLE, mixed connective Direct (ANA-D)           tissue diseases ---------------------   ------------------------   --------- dsDNA                    SLE                        40 - 60% ---------------------    ------------------------   --------- Chromatin                Drug induced SLE                90%                         SLE                        48 - 97% ---------------------   ------------------------   --------- SSA (Ro)                 SLE                        25 - 35%                         Sjogren's Syndrome         40 - 70%                         Neonatal Lupus                 100% ---------------------   ------------------------   --------- SSB (La)                 SLE                              10%                         Sjogren's Syndrome              30% ---------------------   -----------------------    --------- Sm (anti-Scheuring)          SLE                        15 - 30% ---------------------   -----------------------    --------- RNP                      Mixed Connective Tissue                         Disease                         95% (U1 nRNP,                SLE                        30 - 50% anti-ribonucleoprotein)  Polymyositis  and/or                         Dermatomyositis                 20% ---------------------   ------------------------   --------- Scl-70 (antiDNA          Scleroderma (diffuse)      20 - 35% topoisomerase)           Crest                           13% ---------------------   ------------------------   --------- Jo-1                     Polymyositis and/or                         Dermatomyositis            20 - 40% ---------------------   ------------------------   --------- Centromere B             Scleroderma -  Crest                         variant                         80% Performed At: [facility] [place], [place] [id number] Lindon Romp MD [id number]   Miscellaneous [facility] test (send-out)     Status: None   Collection Time: 02/26/17  4:45 PM  Result Value Ref Range   [facility] test code [id number]    [facility] test name ALPHA FETOPROTEIN    Misc [facility] result COMMENT     Comment: (NOTE) Test  Ordered: [id number] AFP with AFP-L3% AFP, Serum                     13.3        [H ] ng/mL    BN     Reference Range: 0.0-8.0                               AFP and AFP-L3% measured by [employer] liquid phase binding methodology. Results for this test should not be used as absolute evidence of presence or absence of malignant disease without confirmation of the diagnosis by another medically established diagnostic product or procedure.  Values obtained with different assay methods or kits cannot be used interchangeably. AFP-L3%, Serum                 12.2        [facility] [address] ] %        BN     Reference Range: 0.0-9.9                               Performed At: [facility] [place], [place] [id number] Lindon Romp MD [id number]   Ceruloplasmin     Status: Abnormal   Collection Time: 02/26/17  4:45 PM  Result Value Ref Range   Ceruloplasmin 16.8 (L) 19.0 - 39.0 mg/dL    Comment: (NOTE) Performed At: [facility] [place], [place] [id number] Lindon Romp MD [id number]  Alpha-1 antitrypsin phenotype     Status: None   Collection Time: 02/26/17  4:45 PM  Result Value Ref Range   A-1 Antitrypsin Pheno MM     Comment: (NOTE)       Phenotype   Population      A-1-AT Concentration                   Incidence %      Reference Interval       MM            86.5%                96 - 189       MS             8.0%                83 - 161       MZ             3.9%                60 - 111       FM             0.4%                93 - 191       SZ             0.3%                42 -  75       SS             0.1%                62 - 119       ZZ             0.05%               16 -  38       FS             0.05%               70 - 128       FZ            Unknown              44 -  88       FF            Unknown              Unknown Performed At: [facility] [place], [place] [id number] Lindon Romp MD  WU:[id number]    A-1 Antitrypsin, Ser 143 90 - 200 mg/dL  AntiMicrosomal Ab-Liver / Kidney     Status: None   Collection Time: 02/26/17  4:45 PM  Result Value Ref Range   LKM1 Ab 2.6 0.0 - 20.0 Units    Comment: (NOTE)                                Negative    0.0 - 20.0                                Equivocal  20.1 - 24.9  Positive         >24.9 LKM type 1 antibodies are detected in patients with autoimmune hepatitis type 2 and in up to 8% of patients with chronic HCV infection. Performed At: [facility] [place], [place] [id number] Lindon Romp MD [id number]   Miscellaneous [facility] test (send-out)     Status: None   Collection Time: 02/26/17  4:45 PM  Result Value Ref Range   [facility] test code [id number]    [facility] test name IMMUNOGLOBULINS,IgG,IgA,IgM    Misc [facility] result COMMENT     Comment: (NOTE) Test Ordered: [id number] Immunoglobulins A/G/M, Qn, Ser Immunoglobulin G, Qn, Serum    1727        [H ] mg/dL    BN     Reference Range: [id number]                              Immunoglobulin A, Qn, Serum    1108        [H ] mg/dL    BN     Reference Range: 87-352                                Results confirmed on dilution. Immunoglobulin M, Qn, Serum    75               mg/dL    BN     Reference Range: 26-217                                Performed At: [facility] [place], [place] [id number] Lindon Romp MD YI:[id number]   Magnesium     Status: Abnormal   Collection Time: 02/26/17  4:45 PM  Result Value Ref Range   Magnesium 1.3 (L) 1.7 - 2.4 mg/dL  Ferritin     Status: None   Collection Time: 02/26/17  4:45 PM  Result Value Ref Range   Ferritin 134 11 - 307 ng/mL  CBC     Status: Abnormal   Collection Time: 02/26/17  4:45 PM  Result Value Ref Range   WBC 5.6 3.6 - 11.0 K/uL   RBC 2.88 (L) 3.80 - 5.20 MIL/uL   Hemoglobin 11.9 (L) 12.0 - 16.0 g/dL   HCT 33.9 (L) 35.0 - 47.0 %   MCV  118.0 (H) 80.0 - 100.0 fL   MCH 41.3 (H) 26.0 - 34.0 pg   MCHC 35.0 32.0 - 36.0 g/dL   RDW 19.0 (H) 11.5 - 14.5 %   Platelets 66 (L) 150 - 440 K/uL  Hepatic function panel     Status: Abnormal   Collection Time: 02/26/17  4:45 PM  Result Value Ref Range   Total Protein 7.2 6.5 - 8.1 g/dL   Albumin 3.1 (L) 3.5 - 5.0 g/dL   AST 43 (H) 15 - 41 U/L   ALT 22 14 - 54 U/L   Alkaline Phosphatase 143 (H) 38 - 126 U/L   Total Bilirubin 8.1 (H) 0.3 - 1.2 mg/dL   Bilirubin, Direct 1.8 (H) 0.1 - 0.5 mg/dL   Indirect Bilirubin 6.3 (H) 0.3 - 0.9 mg/dL  Basic metabolic panel     Status: Abnormal   Collection Time: 02/26/17  4:45 PM  Result Value Ref Range   Sodium 136 135 - 145  mmol/L   Potassium 2.7 (LL) 3.5 - 5.1 mmol/L    Comment: CRITICAL RESULT CALLED TO, READ BACK BY AND VERIFIED WITH DR. Allen Norris @ 1827 02/26/17 BY [facility]    Chloride 97 (L) 101 - 111 mmol/L   CO2 29 22 - 32 mmol/L   Glucose, Bld 131 (H) 65 - 99 mg/dL   BUN 8 6 - 20 mg/dL   Creatinine, Ser 0.66 0.44 - 1.00 mg/dL   Calcium 8.5 (L) 8.9 - 10.3 mg/dL   GFR calc non Af Amer >60 >60 mL/min   GFR calc Af Amer >60 >60 mL/min    Comment: (NOTE) The eGFR has been calculated using the CKD EPI equation. This calculation has not been validated in all clinical situations. eGFR's persistently <60 mL/min signify possible Chronic Kidney Disease.    Anion gap 10 5 - 15  Protime-INR     Status: Abnormal   Collection Time: 02/26/17  4:45 PM  Result Value Ref Range   Prothrombin Time 21.3 (H) 11.4 - 15.2 seconds   INR 1.82   Hepatitis B surface antigen     Status: None   Collection Time: 02/26/17  4:47 PM  Result Value Ref Range   Hepatitis B Surface Ag Negative Negative    Comment: (NOTE) Performed At: [facility] [place], [place] [id number] Lindon Romp MD [id number]   Hepatic function panel     Status: Abnormal   Collection Time: 03/02/17  5:45 PM  Result Value Ref Range   Total Protein 7.5 6.5 -  8.1 g/dL   Albumin 3.2 (L) 3.5 - 5.0 g/dL   AST 48 (H) 15 - 41 U/L   ALT 26 14 - 54 U/L   Alkaline Phosphatase 195 (H) 38 - 126 U/L   Total Bilirubin 5.8 (H) 0.3 - 1.2 mg/dL   Bilirubin, Direct 1.7 (H) 0.1 - 0.5 mg/dL   Indirect Bilirubin 4.1 (H) 0.3 - 0.9 mg/dL  Basic metabolic panel     Status: None   Collection Time: 03/02/17  5:45 PM  Result Value Ref Range   Sodium 136 135 - 145 mmol/L   Potassium 3.9 3.5 - 5.1 mmol/L   Chloride 101 101 - 111 mmol/L   CO2 29 22 - 32 mmol/L   Glucose, Bld 92 65 - 99 mg/dL   BUN 8 6 - 20 mg/dL   Creatinine, Ser 0.78 0.44 - 1.00 mg/dL   Calcium 8.9 8.9 - 10.3 mg/dL   GFR calc non Af Amer >60 >60 mL/min   GFR calc Af Amer >60 >60 mL/min    Comment: (NOTE) The eGFR has been calculated using the CKD EPI equation. This calculation has not been validated in all clinical situations. eGFR's persistently <60 mL/min signify possible Chronic Kidney Disease.    Anion gap 6 5 - 15  Magnesium     Status: None   Collection Time: 03/02/17  5:45 PM  Result Value Ref Range   Magnesium 1.7 1.7 - 2.4 mg/dL  Miscellaneous [facility] test (send-out)     Status: None   Collection Time: 03/05/17  4:00 PM  Result Value Ref Range   [facility] test code [id number]    [facility] test name COPPER URINE 24 HOUR  TV 1500ML    Misc [facility] result COMMENT     Comment: (NOTE) Test Ordered: [id number] Copper, Urine Copper, Urine                  316  ug/L     BN     Reference Range: Not Estab.                                                           Detection Limit = 1 Creatinine(Crt),U              0.42             g/L      BN     Reference Range: 0.30-3.00                                                            Detection Limit = 0.10 Copper/Crt Ratio               752         [H ]          BN     Units of Measure: ug/g creat                         Reference Range: 0-49                                  Copper,Urine 24 Hr             474         [facility] ] ug/24 hr BN      Reference Range: 3-35                                  Performed At: [facility] [place], [place] [id number] Lindon Romp MD [id number]   Hepatic function panel     Status: Abnormal   Collection Time: 03/05/17  4:26 PM  Result Value Ref Range   Total Protein 6.9 6.5 - 8.1 g/dL   Albumin 2.9 (L) 3.5 - 5.0 g/dL   AST 45 (H) 15 - 41 U/L   ALT 27 14 - 54 U/L   Alkaline Phosphatase 150 (H) 38 - 126 U/L   Total Bilirubin 6.7 (H) 0.3 - 1.2 mg/dL   Bilirubin, Direct 1.6 (H) 0.1 - 0.5 mg/dL   Indirect Bilirubin 5.1 (H) 0.3 - 0.9 mg/dL  Basic metabolic panel     Status: Abnormal   Collection Time: 03/05/17  4:26 PM  Result Value Ref Range   Sodium 136 135 - 145 mmol/L   Potassium 4.2 3.5 - 5.1 mmol/L   Chloride 103 101 - 111 mmol/L   CO2 26 22 - 32 mmol/L   Glucose, Bld 165 (H) 65 - 99 mg/dL   BUN 10 6 - 20 mg/dL   Creatinine, Ser 0.79 0.44 - 1.00 mg/dL   Calcium 8.7 (L) 8.9 - 10.3 mg/dL   GFR calc non Af Amer >60 >60 mL/min   GFR calc Af Amer >60 >60 mL/min    Comment: (NOTE) The eGFR has been calculated using the CKD EPI equation. This calculation has not  been validated in all clinical situations. eGFR's persistently <60 mL/min signify possible Chronic Kidney Disease.    Anion gap 7 5 - 15  Magnesium     Status: None   Collection Time: 03/05/17  4:26 PM  Result Value Ref Range   Magnesium 1.7 1.7 - 2.4 mg/dL  Surgical pathology     Status: None   Collection Time: 03/11/17  9:46 AM  Result Value Ref Range   SURGICAL PATHOLOGY      Surgical Pathology CASE: [id number] PATIENT: Johnell Comings Surgical Pathology Report     SPECIMEN SUBMITTED: A. Rectum polyp; cold snare  CLINICAL HISTORY: None provided  PRE-OPERATIVE DIAGNOSIS: Cirrhosis of liver, NASH  POST-OPERATIVE DIAGNOSIS: Portal gastropathy, duodenal ulcer, gastric ulcers, esophagitis, inadequate colon prep, colon polyp     DIAGNOSIS: A.  RECTUM POLYP; COLD SNARE: -  TUBULOVILLOUS ADENOMA. - NEGATIVE FOR HIGH-GRADE DYSPLASIA AND MALIGNANCY.   GROSS DESCRIPTION:  A. Labeled: cold snare rectal polyp  Tissue fragment(s): 3  Size: 0.2-0.5 cm  Description: tan fragments  Entirely submitted in 1 cassette(s).    Final Diagnosis performed by Delorse Lek, MD.  Electronically signed 03/12/2017 11:10:58AM    The electronic signature indicates that the named Attending Pathologist has evaluated the specimen  Technical component performed at [facility], [address], [place], [place] [address] Lab: [phone] Dir: Darrick Penna. Gunnar Fusi, MD  Professional component performed at [facility], [facility], [place], [place], [place] [address] Lab: [phone] Dir: Dellia Nims. Rubinas, MD    H. pylori antibody, IgG     Status: Abnormal   Collection Time: 03/11/17 10:39 AM  Result Value Ref Range   H Pylori IgG 2.07 (H) 0.00 - 0.79 Index Value    Comment: (NOTE)                             Negative           <0.80                             Equivocal    0.80 - 0.89                             Positive           >0.89 Performed At: [facility] [place], [place] [id number] Lindon Romp MD EX:[id number]   Comprehensive metabolic panel     Status: Abnormal   Collection Time: 04/02/17 12:20 PM  Result Value Ref Range   Sodium 136 135 - 145 mmol/L   Potassium 5.0 3.5 - 5.1 mmol/L   Chloride 103 101 - 111 mmol/L   CO2 26 22 - 32 mmol/L   Glucose, Bld 136 (H) 65 - 99 mg/dL   BUN 16 6 - 20 mg/dL   Creatinine, Ser 1.07 (H) 0.44 - 1.00 mg/dL   Calcium 9.2 8.9 - 10.3 mg/dL   Total Protein 6.7 6.5 - 8.1 g/dL   Albumin 2.9 (L) 3.5 - 5.0 g/dL   AST 49 (H) 15 - 41 U/L   ALT 30 14 - 54 U/L   Alkaline Phosphatase 161 (H) 38 - 126 U/L   Total Bilirubin 5.3 (H) 0.3 - 1.2 mg/dL   GFR calc non Af Amer 54 (L) >60 mL/min   GFR calc Af Amer >60 >60 mL/min  Comment: (NOTE) The eGFR has been calculated using the CKD  EPI equation. This calculation has not been validated in all clinical situations. eGFR's persistently <60 mL/min signify possible Chronic Kidney Disease.    Anion gap 7 5 - 15  Magnesium     Status: None   Collection Time: 04/02/17 12:20 PM  Result Value Ref Range   Magnesium 1.7 1.7 - 2.4 mg/dL  CBC     Status: Abnormal   Collection Time: 04/02/17 12:20 PM  Result Value Ref Range   WBC 4.9 3.6 - 11.0 K/uL   RBC 2.80 (L) 3.80 - 5.20 MIL/uL   Hemoglobin 11.9 (L) 12.0 - 16.0 g/dL   HCT 34.2 (L) 35.0 - 47.0 %   MCV 122.2 (H) 80.0 - 100.0 fL   MCH 42.6 (H) 26.0 - 34.0 pg   MCHC 34.8 32.0 - 36.0 g/dL   RDW 19.5 (H) 11.5 - 14.5 %   Platelets 60 (L) 150 - 440 K/uL  CBC     Status: Abnormal   Collection Time: 04/03/17  1:09 PM  Result Value Ref Range   WBC 5.9 3.6 - 11.0 K/uL   RBC 3.01 (L) 3.80 - 5.20 MIL/uL   Hemoglobin 12.7 12.0 - 16.0 g/dL   HCT 35.2 35.0 - 47.0 %   MCV 117.3 (H) 80.0 - 100.0 fL   MCH 42.2 (H) 26.0 - 34.0 pg   MCHC 36.0 32.0 - 36.0 g/dL   RDW 20.4 (H) 11.5 - 14.5 %   Platelets 105 (L) 150 - 440 K/uL  Urinalysis, Complete w Microscopic     Status: Abnormal   Collection Time: 04/03/17  1:09 PM  Result Value Ref Range   Color, Urine AMBER (A) YELLOW    Comment: BIOCHEMICALS MAY BE AFFECTED BY COLOR   APPearance CLOUDY (A) CLEAR   Specific Gravity, Urine 1.028 1.005 - 1.030   pH 5.0 5.0 - 8.0   Glucose, UA NEGATIVE NEGATIVE mg/dL   Hgb urine dipstick NEGATIVE NEGATIVE   Bilirubin Urine SMALL (A) NEGATIVE   Ketones, ur 5 (A) NEGATIVE mg/dL   Protein, ur 100 (A) NEGATIVE mg/dL   Nitrite NEGATIVE NEGATIVE   Leukocytes, UA TRACE (A) NEGATIVE   RBC / HPF 0-5 0 - 5 RBC/hpf   WBC, UA 6-30 0 - 5 WBC/hpf   Bacteria, UA RARE (A) NONE SEEN   Squamous Epithelial / LPF TOO NUMEROUS TO COUNT (A) NONE SEEN   Mucus PRESENT    Budding Yeast PRESENT    Hyaline Casts, UA PRESENT   Ammonia     Status: Abnormal   Collection Time: 04/03/17  2:13 PM  Result Value Ref Range    Ammonia 51 (H) 9 - 35 umol/L  Comprehensive metabolic panel     Status: Abnormal   Collection Time: 04/03/17  2:13 PM  Result Value Ref Range   Sodium 136 135 - 145 mmol/L   Potassium 5.5 (H) 3.5 - 5.1 mmol/L   Chloride 104 101 - 111 mmol/L   CO2 24 22 - 32 mmol/L   Glucose, Bld 152 (H) 65 - 99 mg/dL   BUN 20 6 - 20 mg/dL   Creatinine, Ser 0.92 0.44 - 1.00 mg/dL   Calcium 8.7 (L) 8.9 - 10.3 mg/dL   Total Protein 6.4 (L) 6.5 - 8.1 g/dL   Albumin 2.7 (L) 3.5 - 5.0 g/dL   AST 55 (H) 15 - 41 U/L   ALT 30 14 - 54 U/L   Alkaline Phosphatase 139 (  H) 38 - 126 U/L   Total Bilirubin 6.4 (H) 0.3 - 1.2 mg/dL   GFR calc non Af Amer >60 >60 mL/min   GFR calc Af Amer >60 >60 mL/min    Comment: (NOTE) The eGFR has been calculated using the CKD EPI equation. This calculation has not been validated in all clinical situations. eGFR's persistently <60 mL/min signify possible Chronic Kidney Disease.    Anion gap 8 5 - 15  Lipase, blood     Status: None   Collection Time: 04/03/17  2:13 PM  Result Value Ref Range   Lipase 33 11 - 51 U/L  Troponin I     Status: None   Collection Time: 04/03/17  2:13 PM  Result Value Ref Range   Troponin I <0.03 <0.03 ng/mL  Basic metabolic panel     Status: Abnormal   Collection Time: 04/04/17  6:30 AM  Result Value Ref Range   Sodium 132 (L) 135 - 145 mmol/L   Potassium 6.7 (HH) 3.5 - 5.1 mmol/L    Comment: HEMOLYSIS AT THIS LEVEL MAY AFFECT RESULT CRITICAL RESULT CALLED TO, READ BACK BY AND VERIFIED WITH ERICA MORALES @ 0727 04/04/17 T[facility]    Chloride 102 101 - 111 mmol/L   CO2 23 22 - 32 mmol/L   Glucose, Bld 144 (H) 65 - 99 mg/dL   BUN 27 (H) 6 - 20 mg/dL   Creatinine, Ser 1.05 (H) 0.44 - 1.00 mg/dL   Calcium 8.5 (L) 8.9 - 10.3 mg/dL   GFR calc non Af Amer 56 (L) >60 mL/min   GFR calc Af Amer >60 >60 mL/min    Comment: (NOTE) The eGFR has been calculated using the CKD EPI equation. This calculation has not been validated in all clinical  situations. eGFR's persistently <60 mL/min signify possible Chronic Kidney Disease.    Anion gap 7 5 - 15  Potassium     Status: None   Collection Time: 04/04/17 11:45 AM  Result Value Ref Range   Potassium 4.7 3.5 - 5.1 mmol/L  CBC with Differential/Platelet     Status: Abnormal   Collection Time: 04/05/17  3:58 AM  Result Value Ref Range   WBC 7.3 3.6 - 11.0 K/uL   RBC 2.42 (L) 3.80 - 5.20 MIL/uL   Hemoglobin 10.5 (L) 12.0 - 16.0 g/dL   HCT 29.6 (L) 35.0 - 47.0 %   MCV 122.1 (H) 80.0 - 100.0 fL   MCH 43.5 (H) 26.0 - 34.0 pg   MCHC 35.6 32.0 - 36.0 g/dL   RDW 19.2 (H) 11.5 - 14.5 %   Platelets 47 (L) 150 - 440 K/uL   Neutrophils Relative % 69 %   Neutro Abs 5.1 1.4 - 6.5 K/uL   Lymphocytes Relative 17 %   Lymphs Abs 1.3 1.0 - 3.6 K/uL   Monocytes Relative 12 %   Monocytes Absolute 0.8 0.2 - 0.9 K/uL   Eosinophils Relative 2 %   Eosinophils Absolute 0.1 0 - 0.7 K/uL   Basophils Relative 0 %   Basophils Absolute 0.0 0 - 0.1 K/uL  Comprehensive metabolic panel     Status: Abnormal   Collection Time: 04/05/17  3:58 AM  Result Value Ref Range   Sodium 136 135 - 145 mmol/L   Potassium 4.4 3.5 - 5.1 mmol/L   Chloride 101 101 - 111 mmol/L   CO2 31 22 - 32 mmol/L   Glucose, Bld 156 (H) 65 - 99 mg/dL   BUN 26 (H) 6 -  20 mg/dL   Creatinine, Ser 0.91 0.44 - 1.00 mg/dL   Calcium 8.0 (L) 8.9 - 10.3 mg/dL   Total Protein 5.6 (L) 6.5 - 8.1 g/dL   Albumin 2.4 (L) 3.5 - 5.0 g/dL   AST 38 15 - 41 U/L   ALT 25 14 - 54 U/L   Alkaline Phosphatase 103 38 - 126 U/L   Total Bilirubin 5.2 (H) 0.3 - 1.2 mg/dL   GFR calc non Af Amer >60 >60 mL/min   GFR calc Af Amer >60 >60 mL/min    Comment: (NOTE) The eGFR has been calculated using the CKD EPI equation. This calculation has not been validated in all clinical situations. eGFR's persistently <60 mL/min signify possible Chronic Kidney Disease.    Anion gap 4 (L) 5 - 15     Assessment & Plan  1. Anxiety Stable, compliant with  controlled substances agreement, understands the dependence potential, side effects and drug interactions of benzodiazepines, especially with opioids, refills provided - alprazolam (XANAX) 2 MG tablet; Take 1 tablet (2 mg total) by mouth 2 (two) times daily as needed for anxiety.  Dispense: 60 tablet; Refill: 0  2. Chronic midline low back pain without sciatica Chronic low back pain, stable on opiate treatment, refills provided and have advised that preparation for my departure from the practice, she will need to be referred to a different provider for pain management or she will have to be tapered down. We'll discuss in detail at her upcoming appointment - oxyCODONE (OXY IR/ROXICODONE) 5 MG immediate release tablet; Take 1 tablet (5 mg total) by mouth every 8 (eight) hours as needed for moderate pain.  Dispense: 90 tablet; Refill: 0  3. Acute on chronic diastolic congestive heart failure ([place]) Reviewed notes from hospital, patient follows up with cardiology, on Lasix 40 mg twice a day  4. Cirrhosis of liver without ascites, unspecified hepatic cirrhosis type [facility]) Being followed by gastroenterology, she is on spironolactone, pending liver biopsy  5. Pyelonephritis Resolved, completed antibiotic course  6. Needs flu shot  - Flu Vaccine QUAD 6+ mos PF IM (Fluarix Quad PF)    Keondrick Dilks [facility] 04/19/2017 1:39 PM

## 2017-04-20 DIAGNOSIS — I5033 Acute on chronic diastolic (congestive) heart failure: Secondary | ICD-10-CM | POA: Diagnosis not present

## 2017-04-20 DIAGNOSIS — K3189 Other diseases of stomach and duodenum: Secondary | ICD-10-CM | POA: Diagnosis not present

## 2017-04-20 DIAGNOSIS — K7581 Nonalcoholic steatohepatitis (NASH): Secondary | ICD-10-CM | POA: Diagnosis not present

## 2017-04-20 DIAGNOSIS — I110 Hypertensive heart disease with heart failure: Secondary | ICD-10-CM | POA: Diagnosis not present

## 2017-04-20 DIAGNOSIS — M170 Bilateral primary osteoarthritis of knee: Secondary | ICD-10-CM | POA: Diagnosis not present

## 2017-04-20 DIAGNOSIS — R188 Other ascites: Secondary | ICD-10-CM | POA: Diagnosis not present

## 2017-04-21 ENCOUNTER — Telehealth: Payer: Self-pay

## 2017-04-21 ENCOUNTER — Other Ambulatory Visit: Payer: Self-pay | Admitting: Gastroenterology

## 2017-04-21 NOTE — Telephone Encounter (Signed)
Leah Shah, Pt asking for a verbal home health therapy 2 times a week for 4 weeks. Called back number is [phone]

## 2017-04-22 ENCOUNTER — Telehealth: Payer: Self-pay | Admitting: Gastroenterology

## 2017-04-22 ENCOUNTER — Telehealth: Payer: Self-pay

## 2017-04-22 ENCOUNTER — Telehealth: Payer: Self-pay | Admitting: Family Medicine

## 2017-04-22 DIAGNOSIS — I110 Hypertensive heart disease with heart failure: Secondary | ICD-10-CM | POA: Diagnosis not present

## 2017-04-22 DIAGNOSIS — K3189 Other diseases of stomach and duodenum: Secondary | ICD-10-CM | POA: Diagnosis not present

## 2017-04-22 DIAGNOSIS — M170 Bilateral primary osteoarthritis of knee: Secondary | ICD-10-CM | POA: Diagnosis not present

## 2017-04-22 DIAGNOSIS — I5033 Acute on chronic diastolic (congestive) heart failure: Secondary | ICD-10-CM | POA: Diagnosis not present

## 2017-04-22 DIAGNOSIS — R188 Other ascites: Secondary | ICD-10-CM | POA: Diagnosis not present

## 2017-04-22 DIAGNOSIS — K7581 Nonalcoholic steatohepatitis (NASH): Secondary | ICD-10-CM | POA: Diagnosis not present

## 2017-04-22 NOTE — Telephone Encounter (Signed)
Spoke with Lenna Sciara and Flora that Dr. Manuella Ghazi approve the orders for her to have a nurse 2 times a week for 4 weeks.

## 2017-04-22 NOTE — Telephone Encounter (Signed)
PLEASE GIVE MELISA AT [facility] A CALL AT [phone] ABOUT WANTING ORDERS FOR A NURSE AND A SOCIAL WORKER DUE TO GETTING THE PATIENT A LIFE ALERT SINCE PATIENT HAS FEAR OF FALLING AND HER HUSBAND WORKS DURING THE DAY. THE NURSE WOULD BE TO HELP THE PATIENT TO UNDERSTAND WHY SHE NEEDS TO BE COMPLIANT ON MEDS AND DIET .

## 2017-04-22 NOTE — Telephone Encounter (Signed)
Patient left a voice message that she is returning a call. Please call

## 2017-04-22 NOTE — Telephone Encounter (Signed)
Spoke with Dr. Manuella Ghazi and he has approved the orders verbally for the pt to have a nurse and a Education officer, museum for life alert. The nurse will also help with her meds and diet. Leah Shah has been informed of the approval

## 2017-04-22 NOTE — Telephone Encounter (Signed)
Patient said she did not have her liver biopsy.  Does she still need it-please advise.  Thanks [employer]

## 2017-04-23 DIAGNOSIS — I110 Hypertensive heart disease with heart failure: Secondary | ICD-10-CM | POA: Diagnosis not present

## 2017-04-23 DIAGNOSIS — K3189 Other diseases of stomach and duodenum: Secondary | ICD-10-CM | POA: Diagnosis not present

## 2017-04-23 DIAGNOSIS — M170 Bilateral primary osteoarthritis of knee: Secondary | ICD-10-CM | POA: Diagnosis not present

## 2017-04-23 DIAGNOSIS — R188 Other ascites: Secondary | ICD-10-CM | POA: Diagnosis not present

## 2017-04-23 DIAGNOSIS — I5033 Acute on chronic diastolic (congestive) heart failure: Secondary | ICD-10-CM | POA: Diagnosis not present

## 2017-04-23 DIAGNOSIS — K7581 Nonalcoholic steatohepatitis (NASH): Secondary | ICD-10-CM | POA: Diagnosis not present

## 2017-04-23 NOTE — Telephone Encounter (Signed)
[facility] has been informed that PT has been approved.

## 2017-04-25 DIAGNOSIS — K7581 Nonalcoholic steatohepatitis (NASH): Secondary | ICD-10-CM | POA: Diagnosis not present

## 2017-04-25 DIAGNOSIS — I5033 Acute on chronic diastolic (congestive) heart failure: Secondary | ICD-10-CM | POA: Diagnosis not present

## 2017-04-25 DIAGNOSIS — R188 Other ascites: Secondary | ICD-10-CM | POA: Diagnosis not present

## 2017-04-25 DIAGNOSIS — I110 Hypertensive heart disease with heart failure: Secondary | ICD-10-CM | POA: Diagnosis not present

## 2017-04-25 DIAGNOSIS — K3189 Other diseases of stomach and duodenum: Secondary | ICD-10-CM | POA: Diagnosis not present

## 2017-04-25 DIAGNOSIS — M170 Bilateral primary osteoarthritis of knee: Secondary | ICD-10-CM | POA: Diagnosis not present

## 2017-04-27 ENCOUNTER — Telehealth: Payer: Self-pay | Admitting: Family Medicine

## 2017-04-27 DIAGNOSIS — I5033 Acute on chronic diastolic (congestive) heart failure: Secondary | ICD-10-CM | POA: Diagnosis not present

## 2017-04-27 DIAGNOSIS — K7581 Nonalcoholic steatohepatitis (NASH): Secondary | ICD-10-CM | POA: Diagnosis not present

## 2017-04-27 DIAGNOSIS — I110 Hypertensive heart disease with heart failure: Secondary | ICD-10-CM | POA: Diagnosis not present

## 2017-04-27 DIAGNOSIS — M170 Bilateral primary osteoarthritis of knee: Secondary | ICD-10-CM | POA: Diagnosis not present

## 2017-04-27 DIAGNOSIS — K3189 Other diseases of stomach and duodenum: Secondary | ICD-10-CM | POA: Diagnosis not present

## 2017-04-27 DIAGNOSIS — R188 Other ascites: Secondary | ICD-10-CM | POA: Diagnosis not present

## 2017-04-27 NOTE — Telephone Encounter (Signed)
Copied from [place] #[id number]. Topic: General - Other >> Apr 27, 2017  9:39 AM Pricilla Handler wrote: Reason for CRM: Estill Bamberg of [facility] called requesting Home Health Orders ASAP. Please call Estill Bamberg at [phone], Fax # [phone]. Thank You!!!

## 2017-04-28 DIAGNOSIS — K3189 Other diseases of stomach and duodenum: Secondary | ICD-10-CM | POA: Diagnosis not present

## 2017-04-28 DIAGNOSIS — K7581 Nonalcoholic steatohepatitis (NASH): Secondary | ICD-10-CM | POA: Diagnosis not present

## 2017-04-28 DIAGNOSIS — M170 Bilateral primary osteoarthritis of knee: Secondary | ICD-10-CM | POA: Diagnosis not present

## 2017-04-28 DIAGNOSIS — R188 Other ascites: Secondary | ICD-10-CM | POA: Diagnosis not present

## 2017-04-28 DIAGNOSIS — I5033 Acute on chronic diastolic (congestive) heart failure: Secondary | ICD-10-CM | POA: Diagnosis not present

## 2017-04-28 DIAGNOSIS — I110 Hypertensive heart disease with heart failure: Secondary | ICD-10-CM | POA: Diagnosis not present

## 2017-04-29 ENCOUNTER — Telehealth: Payer: Self-pay

## 2017-04-29 ENCOUNTER — Telehealth: Payer: Self-pay | Admitting: General Surgery

## 2017-04-29 DIAGNOSIS — I5033 Acute on chronic diastolic (congestive) heart failure: Secondary | ICD-10-CM | POA: Diagnosis not present

## 2017-04-29 DIAGNOSIS — R188 Other ascites: Secondary | ICD-10-CM | POA: Diagnosis not present

## 2017-04-29 DIAGNOSIS — K3189 Other diseases of stomach and duodenum: Secondary | ICD-10-CM | POA: Diagnosis not present

## 2017-04-29 DIAGNOSIS — I5032 Chronic diastolic (congestive) heart failure: Secondary | ICD-10-CM

## 2017-04-29 DIAGNOSIS — I110 Hypertensive heart disease with heart failure: Secondary | ICD-10-CM | POA: Diagnosis not present

## 2017-04-29 DIAGNOSIS — K7581 Nonalcoholic steatohepatitis (NASH): Secondary | ICD-10-CM | POA: Diagnosis not present

## 2017-04-29 DIAGNOSIS — M170 Bilateral primary osteoarthritis of knee: Secondary | ICD-10-CM | POA: Diagnosis not present

## 2017-04-29 NOTE — Telephone Encounter (Signed)
rec'd phone call from Physical Therapist, Dianah Field, with [facility]. (Ph # [phone])  Reported a drop in O2 Sat with activity, during PT today.  Reported pt. had decrease in O2 Sat. to 85 % on room air, with sitting up.  Stated after 1 minute, 20 seconds, the O2 Sat increased to 91 %.  Then reported with standing, the pt's O2 sat decreased to 80 %, and c/o dizziness @ that time.  After 3 min.,  the O2 Sat. increased to 98 % on room air, and dizziness resolved.  Stated the pt. denied any chest pain.  Stated the pt. c/o some shortness of breath with activity, but did not show any signs of resp. Distress, or labored breathing.  Reported she did not check the BP today.  Stated she has leg swelling, bilaterally.  Will send note to Flow Coordinator to inform the MD.

## 2017-04-29 NOTE — Telephone Encounter (Signed)
Copied from [place]. Topic: Quick Communication - See Telephone Encounter >> Apr 29, 2017  3:36 PM Boyd Kerbs wrote: CRM for notification. See Telephone encounter for:  04/29/17. Oxygen reading drops down to 85. Goes up after 1 min. 20 sec. She is feeling light headed

## 2017-04-29 NOTE — Telephone Encounter (Signed)
LVM for patient to inform her that her liver biopsy has been scheduled for Tues October 30th report to [facility] at [facility] for 10am biopsy.  Thanks [employer]

## 2017-04-29 NOTE — Telephone Encounter (Signed)
Questioned the PT during previous call, if pt. Had an appt. To f/u with the Heart Failure Clinic.  Stated the pt. did not know anything about the diagnosis of Heart Failure, until she saw Dr. Manuella Ghazi recently.  Encouraged that the PT will advise the pt. To schedule the Heart Failure Clinic Appt.  Will make Dr. Manuella Ghazi aware of symptoms reported previously, PT.

## 2017-04-30 NOTE — Telephone Encounter (Signed)
Referral needed for heart failure clinic.

## 2017-04-30 NOTE — Telephone Encounter (Signed)
Agree with above, patient needs to be seen by heart failure clinic as soon as possible.

## 2017-05-01 DIAGNOSIS — I110 Hypertensive heart disease with heart failure: Secondary | ICD-10-CM | POA: Diagnosis not present

## 2017-05-01 DIAGNOSIS — R188 Other ascites: Secondary | ICD-10-CM | POA: Diagnosis not present

## 2017-05-01 DIAGNOSIS — I5033 Acute on chronic diastolic (congestive) heart failure: Secondary | ICD-10-CM | POA: Diagnosis not present

## 2017-05-01 DIAGNOSIS — M170 Bilateral primary osteoarthritis of knee: Secondary | ICD-10-CM | POA: Diagnosis not present

## 2017-05-01 DIAGNOSIS — K7581 Nonalcoholic steatohepatitis (NASH): Secondary | ICD-10-CM | POA: Diagnosis not present

## 2017-05-01 DIAGNOSIS — K3189 Other diseases of stomach and duodenum: Secondary | ICD-10-CM | POA: Diagnosis not present

## 2017-05-03 ENCOUNTER — Ambulatory Visit: Payer: Medicare HMO

## 2017-05-03 ENCOUNTER — Other Ambulatory Visit (INDEPENDENT_AMBULATORY_CARE_PROVIDER_SITE_OTHER): Payer: Medicare HMO

## 2017-05-03 ENCOUNTER — Other Ambulatory Visit: Payer: Self-pay | Admitting: General Surgery

## 2017-05-03 ENCOUNTER — Ambulatory Visit (INDEPENDENT_AMBULATORY_CARE_PROVIDER_SITE_OTHER): Payer: Medicare HMO | Admitting: Gastroenterology

## 2017-05-03 ENCOUNTER — Other Ambulatory Visit: Payer: Self-pay

## 2017-05-03 ENCOUNTER — Ambulatory Visit: Payer: Medicare HMO | Admitting: Gastroenterology

## 2017-05-03 DIAGNOSIS — Z23 Encounter for immunization: Secondary | ICD-10-CM

## 2017-05-04 ENCOUNTER — Other Ambulatory Visit: Payer: Self-pay | Admitting: Gastroenterology

## 2017-05-04 ENCOUNTER — Ambulatory Visit
Admission: RE | Admit: 2017-05-04 | Discharge: 2017-05-04 | Disposition: A | Discharge status: Home / Self Care | Payer: Medicare HMO | Source: Ambulatory Visit | Attending: Gastroenterology | Admitting: Gastroenterology

## 2017-05-04 ENCOUNTER — Other Ambulatory Visit: Payer: Self-pay

## 2017-05-04 DIAGNOSIS — R188 Other ascites: Secondary | ICD-10-CM | POA: Insufficient documentation

## 2017-05-04 DIAGNOSIS — I5032 Chronic diastolic (congestive) heart failure: Secondary | ICD-10-CM | POA: Diagnosis not present

## 2017-05-04 DIAGNOSIS — Z87891 Personal history of nicotine dependence: Secondary | ICD-10-CM | POA: Insufficient documentation

## 2017-05-04 DIAGNOSIS — K7460 Unspecified cirrhosis of liver: Secondary | ICD-10-CM

## 2017-05-04 DIAGNOSIS — E785 Hyperlipidemia, unspecified: Secondary | ICD-10-CM | POA: Insufficient documentation

## 2017-05-04 DIAGNOSIS — Z9049 Acquired absence of other specified parts of digestive tract: Secondary | ICD-10-CM | POA: Insufficient documentation

## 2017-05-04 DIAGNOSIS — Z79899 Other long term (current) drug therapy: Secondary | ICD-10-CM | POA: Insufficient documentation

## 2017-05-04 DIAGNOSIS — K739 Chronic hepatitis, unspecified: Secondary | ICD-10-CM | POA: Diagnosis not present

## 2017-05-04 DIAGNOSIS — M1711 Unilateral primary osteoarthritis, right knee: Secondary | ICD-10-CM | POA: Insufficient documentation

## 2017-05-04 DIAGNOSIS — I110 Hypertensive heart disease with heart failure: Secondary | ICD-10-CM | POA: Diagnosis not present

## 2017-05-04 DIAGNOSIS — F419 Anxiety disorder, unspecified: Secondary | ICD-10-CM | POA: Diagnosis not present

## 2017-05-04 DIAGNOSIS — Z7982 Long term (current) use of aspirin: Secondary | ICD-10-CM | POA: Insufficient documentation

## 2017-05-04 LAB — PROTIME-INR
INR: 1.63
PROTHROMBIN TIME: 19.2 s — AB (ref 11.4–15.2)

## 2017-05-04 LAB — CBC
HEMATOCRIT: 35.2 % (ref 35.0–47.0)
HEMOGLOBIN: 11.9 g/dL — AB (ref 12.0–16.0)
MCH: 42.3 pg — ABNORMAL HIGH (ref 26.0–34.0)
MCHC: 33.9 g/dL (ref 32.0–36.0)
MCV: 124.8 fL — ABNORMAL HIGH (ref 80.0–100.0)
Platelets: 72 10*3/uL — ABNORMAL LOW (ref 150–440)
RBC: 2.82 MIL/uL — ABNORMAL LOW (ref 3.80–5.20)
RDW: 18.7 % — AB (ref 11.5–14.5)
WBC: 7.4 10*3/uL (ref 3.6–11.0)

## 2017-05-04 MED ORDER — MIDAZOLAM HCL 2 MG/2ML IJ SOLN
INTRAMUSCULAR | Status: AC
  Filled 2017-05-04: qty 4

## 2017-05-04 MED ORDER — FENTANYL CITRATE (PF) 100 MCG/2ML IJ SOLN
INTRAMUSCULAR | Status: AC
  Filled 2017-05-04: qty 2

## 2017-05-04 MED ORDER — SODIUM CHLORIDE 0.9 % IV SOLN
INTRAVENOUS | Status: DC
  Administered 2017-05-04: 10:00:00 via INTRAVENOUS

## 2017-05-04 NOTE — H&P (Signed)
Chief Complaint: Patient was seen in consultation today for cirrhosis  Referring Physician(s): Lin Landsman  Supervising Physician: Markus Daft  Patient Status: [facility] - Out-pt  History of Present Illness: Leah Shah is a 61 y.o. female with past medical history of anxiety, CHF, HLD, HTN, osteoarthritis, and cirrhosis presents with persistently elevated bilirubin and copper in her urine.  IR consulted for random liver biopsy at the request of Dr. Marius Ditch.   Patient presents for procedure today in her usual state of health.  She has been NPO.  She does not take blood thinners.   Past Medical History:  Diagnosis Date  . Anxiety   . Arthritis    R knee, L knee - OA  . Chronic diastolic CHF (congestive heart failure) ([place])    a. 06/2016 Echo: EF 60-65%, no rwma, mod MR, mildly dil LA, nl RV fxn, PASP 51mmHg.  [name] Cirrhosis ([place])   . Dysrhythmia    "mild arrythmia after taking Redux diet med years ago"no cardio fi  . Headache(784.0)   . History of stress test    a. 06/2016 MV: EF 67%, no ischemia/infarct.  . Hyperlipidemia   . Hypertension    echoJefm Bryant, wnl 2009? , had taken Redux for diet management  but now  off the market. pt, told that echo wnl.    . Moderate mitral regurgitation    a. 06/2016 Echo: EF 60-65%, mod MR.  . Morbid obesity ([place])   . Osteoarthritis   . Recurrent upper respiratory infection (URI)    treated /w OTC med.     Past Surgical History:  Procedure Laterality Date  . ABDOMINAL HYSTERECTOMY     partial-uterus removed  . CHOLECYSTECTOMY     2011  . COCCYX REMOVAL     2004[facility]  . COLONOSCOPY WITH PROPOFOL N/A 03/11/2017   Procedure: COLONOSCOPY WITH PROPOFOL;  Surgeon: Lin Landsman, MD;  Location: [facility] ENDOSCOPY;  Service: Gastroenterology;  Laterality: N/A;  . ESOPHAGOGASTRODUODENOSCOPY (EGD) WITH PROPOFOL N/A 03/11/2017   Procedure: ESOPHAGOGASTRODUODENOSCOPY (EGD) WITH PROPOFOL;  Surgeon: Lin Landsman, MD;  Location:  [facility] ENDOSCOPY;  Service: Gastroenterology;  Laterality: N/A;  . EYE SURGERY     states both lens replaced. Not sure if cataract surgery or not.  [name] HEEL SPUR SURGERY    . I&D EXTREMITY  08/27/2011   Procedure: IRRIGATION AND DEBRIDEMENT EXTREMITY;  Surgeon: [name], MD;  Location: [place];  Service: Orthopedics;  Laterality: Right;  . JOINT REPLACEMENT Right 2012   Right TKR  . KNEE ARTHROPLASTY  07/28/2011   Procedure: COMPUTER ASSISTED TOTAL KNEE ARTHROPLASTY;  Surgeon: [name], MD;  Location: [place];  Service: Orthopedics;  Laterality: Right;  right total knee arthroplasty, computer assisted  . KNEE ARTHROSCOPY     right knee  . TMJ ARTHROPLASTY     [facility]- 1997    Allergies: Bacitracin-neomycin-polymyxin; Codeine; Gabapentin; Neosporin  [neomycin-bacitracin zn-polymyx]; and Pregabalin  Medications: Prior to Admission medications   Medication Sig Start Date End Date Taking? Authorizing Provider  albuterol (PROVENTIL HFA;VENTOLIN HFA) 108 (90 Base) MCG/ACT inhaler Inhale 2 puffs into the lungs every 6 (six) hours as needed for wheezing or shortness of breath. 08/29/16   Loletha Grayer, MD  alprazolam Duanne Moron) 2 MG tablet Take 1 tablet (2 mg total) by mouth 2 (two) times daily as needed for anxiety. 04/19/17 05/19/17  Roselee Nova, MD  aspirin 81 MG EC tablet Take 1 tablet (81 mg total) by mouth daily.  08/30/16   Loletha Grayer, MD  atenolol (TENORMIN) 100 MG tablet TAKE 1 TABLET (100 MG TOTAL) BY MOUTH DAILY. 03/29/17   Roselee Nova, MD  cephALEXin (KEFLEX) 500 MG capsule Take 1 capsule (500 mg total) by mouth every 12 (twelve) hours. Patient not taking: Reported on 04/19/2017 04/07/17   Fritzi Mandes, MD  citalopram (CELEXA) 10 MG tablet Take 1 tablet (10 mg total) by mouth daily. 02/04/17   Roselee Nova, MD  cyclobenzaprine (FLEXERIL) 5 MG tablet TAKE ONE AND ONE-HALF TABLETS BY MOUTH EVERY NIGHT AT BEDTIME 03/04/17   Roselee Nova, MD  furosemide (LASIX)  40 MG tablet Take 2 tablets (80 mg total) by mouth daily. In the morning 04/02/17 07/01/17  Lin Landsman, MD  furosemide (LASIX) 80 MG tablet Take 80 mg by mouth daily. 04/19/17   [provider]  hydrOXYzine (ATARAX/VISTARIL) 25 MG tablet Take 1 tablet (25 mg total) by mouth every 8 (eight) hours as needed. 01/07/17   Roselee Nova, MD  KLOR-CON M20 20 MEQ tablet Take 60 mEq by mouth daily. 03/25/17   [provider]  lactulose (CHRONULAC) 10 GM/15ML solution Take 15 mLs (10 g total) by mouth 2 (two) times daily. 04/07/17   Fritzi Mandes, MD  Magnesium Oxide 400 (240 Mg) MG TABS TAKE 1 TABLET BY MOUTH TWICE A DAY 03/30/17   Lin Landsman, MD  omeprazole (PRILOSEC) 40 MG capsule Take 1 capsule (40 mg total) by mouth 2 (two) times daily before a meal. 03/11/17 06/09/17  Vanga, Tally Due, MD  oxyCODONE (OXY IR/ROXICODONE) 5 MG immediate release tablet Take 1 tablet (5 mg total) by mouth every 8 (eight) hours as needed for moderate pain. 04/19/17 05/19/17  Roselee Nova, MD  polyethylene glycol [facility] / Floria Raveling) packet Take 17 g by mouth daily as needed for mild constipation. Patient not taking: Reported on 04/19/2017 04/07/17   Fritzi Mandes, MD  spironolactone (ALDACTONE) 100 MG tablet Take 1 tablet (100 mg total) by mouth daily. 04/07/17 07/06/17  Fritzi Mandes, MD  traZODone (DESYREL) 150 MG tablet Take 1 tablet by mouth daily as needed. 10/13/13   [provider]  Vitamin D, Ergocalciferol, (DRISDOL) 50000 units CAPS capsule Take 1 capsule (50,000 Units total) by mouth every 7 (seven) days. 03/03/17 06/17/17  Lin Landsman, MD     Family History  Problem Relation Age of Onset  . Heart disease Mother   . Hypertension Mother   . Hypertension Father   . Heart disease Sister 73       stent   . Heart attack Sister   . Hyperlipidemia Sister   . Hypertension Sister   . Heart attack Maternal Uncle 64  . Heart disease Maternal Uncle        CABG    Social  History   Social History  . Marital status: Married    Spouse name: N/A  . Number of children: N/A  . Years of education: N/A   Social History Main Topics  . Smoking status: Former Smoker    Packs/day: 0.25    Years: 50.00    Types: Cigarettes    Quit date: 03/20/2017  . Smokeless tobacco: Never Used  . Alcohol use No  . Drug use: No  . Sexual activity: Not Currently   Other Topics Concern  . Not on file   Social History Narrative   Lives in [place] with husband.  Does not routinely exercise.  Review of Systems  Constitutional: Negative for fatigue and fever.  Respiratory: Negative for cough and shortness of breath.   Cardiovascular: Negative for chest pain.  Gastrointestinal: Negative for abdominal pain.  Musculoskeletal: Negative for back pain.  Psychiatric/Behavioral: Negative for behavioral problems and confusion.    Vital Signs: BP (!) 145/50   Pulse 84   Temp 98.4 F (36.9 C) (Oral)   Resp 18   Wt 266 lb (120.7 kg)   SpO2 96%   BMI 40.45 kg/m   Physical Exam  Constitutional: She is oriented to person, place, and time. She appears well-developed.  Eyes: Scleral icterus is present.  Cardiovascular: Normal rate, regular rhythm and normal heart sounds.   Pulmonary/Chest: Effort normal and breath sounds normal. No respiratory distress.  Abdominal: Soft.  Neurological: She is alert and oriented to person, place, and time.  Skin: Skin is warm and dry.  jaundice  Psychiatric: She has a normal mood and affect. Her behavior is normal. Judgment and thought content normal.  Nursing note and vitals reviewed.   Imaging: US Abdomen Limited  Result Date: 04/05/2017 CLINICAL DATA:  Cirrhotic liver disease and ascites. Evaluation of ascites performed prior to possible paracentesis. EXAM: LIMITED ABDOMEN ULTRASOUND FOR ASCITES TECHNIQUE: Limited ultrasound survey for ascites was performed in all four abdominal quadrants. COMPARISON:  CT of the abdomen on  04/03/2017 FINDINGS: Ultrasound demonstrates only a trace amount of ascites scattered between bowel loops in the peritoneal cavity. There is not enough fluid to allow paracentesis. IMPRESSION: Trace ascites in the peritoneal cavity. There was not enough fluid present to allow for paracentesis. Electronically Signed   By: Aletta Edouard M.D.   On: 04/05/2017 13:36    Labs:  CBC:  Recent Labs  02/26/17 1645 04/02/17 1220 04/03/17 1309 04/05/17 0358  WBC 5.6 4.9 5.9 7.3  HGB 11.9* 11.9* 12.7 10.5*  HCT 33.9* 34.2* 35.2 29.6*  PLT 66* 60* 105* 47*    COAGS:  Recent Labs  02/26/17 1645  INR 1.82    BMP:  Recent Labs  04/02/17 1220 04/03/17 1413 04/04/17 0630 04/04/17 1145 04/05/17 0358  NA 136 136 132*  --  136  K 5.0 5.5* 6.7* 4.7 4.4  CL 103 104 102  --  101  CO2 26 24 23   --  31  GLUCOSE 136* 152* 144*  --  156*  BUN 16 20 27*  --  26*  CALCIUM 9.2 8.7* 8.5*  --  8.0*  CREATININE 1.07* 0.92 1.05*  --  0.91  GFRNONAA 54* >60 56*  --  >60  GFRAA >60 >60 >60  --  >60    LIVER FUNCTION TESTS:  Recent Labs  03/05/17 1626 04/02/17 1220 04/03/17 1413 04/05/17 0358  BILITOT 6.7* 5.3* 6.4* 5.2*  AST 45* 49* 55* 38  ALT 27 30 30 25   ALKPHOS 150* 161* 139* 103  PROT 6.9 6.7 6.4* 5.6*  ALBUMIN 2.9* 2.9* 2.7* 2.4*    TUMOR MARKERS: No results for input(s): AFPTM, CEA, CA199, CHROMGRNA in the last 8760 hours.  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  IR consulted for random liver biopsy at the request of Dr. Marius Ditch. Patient presents today in their usual state of health.  She has been NPO and is not currently on blood thinners.  Risks and benefits discussed with the patient including, but not limited to bleeding, infection, damage  to adjacent structures or low yield requiring additional tests. All of the  patient's questions were answered, patient is agreeable to proceed. Consent signed and in chart.   Thank you for this interesting consult.  I greatly enjoyed meeting RACHAL DVORSKY and look forward to participating in their care.  A copy of this report was sent to the requesting provider on this date.  Electronically Signed: Docia Barrier, PA 05/04/2017, 9:50 AM   I spent a total of  30 Minutes   in face to face in clinical consultation, greater than 50% of which was counseling/coordinating care for cirrhosis

## 2017-05-04 NOTE — Discharge Instructions (Signed)
Liver Biopsy, Care After  Refer to this sheet in the next few weeks. These instructions provide you with information on caring for yourself after your procedure. Your health care provider may also give you more specific instructions. Your treatment has been planned according to current medical practices, but problems sometimes occur. Call your health care provider if you have any problems or questions after your procedure.  What can I expect after the procedure?  After your procedure, it is typical to have the following:  · A small amount of discomfort in the area where the biopsy was done and in the right shoulder or shoulder blade.  · A small amount of bruising around the area where the biopsy was done and on the skin over the liver.  · Sleepiness and fatigue for the rest of the day.    Follow these instructions at home:  · Rest at home for 1-2 days or as directed by your health care provider.  · Have a friend or family member stay with you for at least 24 hours.  · Because of the medicines used during the procedure, you should not do the following things in the first 24 hours:  ? Drive.  ? Use machinery.  ? Be responsible for the care of other people.  ? Sign legal documents.  ? Take a bath or shower.  · There are many different ways to close and cover an incision, including stitches, skin glue, and adhesive strips. Follow your health care provider's instructions on:  ? Incision care.  ? Bandage (dressing) changes and removal.  ? Incision closure removal.  · Do not drink alcohol in the first week.  · Do not lift more than 5 pounds or play contact sports for 2 weeks after this test.  · Take medicines only as directed by your health care provider. Do not take medicine containing aspirin or non-steroidal anti-inflammatory medicines such as ibuprofen for 1 week after this test.  · It is your responsibility to get your test results.  Contact a health care provider if:  · You have increased bleeding from an incision  that results in more than a small spot of blood.  · You have redness, swelling, or increasing pain in any incisions.  · You notice a discharge or a bad smell coming from any of your incisions.  · You have a fever or chills.  Get help right away if:  · You develop swelling, bloating, or pain in your abdomen.  · You become dizzy or faint.  · You develop a rash.  · You are nauseous or vomit.  · You have difficulty breathing, feel short of breath, or feel faint.  · You develop chest pain.  · You have problems with your speech or vision.  · You have trouble balancing or moving your arms or legs.  This information is not intended to replace advice given to you by your health care provider. Make sure you discuss any questions you have with your health care provider.  Document Released: 01/09/2005 Document Revised: 11/28/2015 Document Reviewed: 08/18/2013  Elsevier Interactive Patient Education © 2018 [employer]

## 2017-05-04 NOTE — Telephone Encounter (Signed)
Referral for  CHF clinic has been entered

## 2017-05-04 NOTE — OR Nursing (Signed)
Procedure to be rescheduled due to risk factors, pt to return Friday Nov 2nd at 11 am, NPO but told by Dr Anselm Pancoast to take lasix that am. Hold Lactulose

## 2017-05-05 ENCOUNTER — Telehealth: Payer: Self-pay | Admitting: Family Medicine

## 2017-05-05 ENCOUNTER — Other Ambulatory Visit: Payer: Self-pay | Admitting: Radiology

## 2017-05-05 DIAGNOSIS — I5033 Acute on chronic diastolic (congestive) heart failure: Secondary | ICD-10-CM | POA: Diagnosis not present

## 2017-05-05 DIAGNOSIS — I110 Hypertensive heart disease with heart failure: Secondary | ICD-10-CM | POA: Diagnosis not present

## 2017-05-05 DIAGNOSIS — K3189 Other diseases of stomach and duodenum: Secondary | ICD-10-CM | POA: Diagnosis not present

## 2017-05-05 DIAGNOSIS — K7581 Nonalcoholic steatohepatitis (NASH): Secondary | ICD-10-CM | POA: Diagnosis not present

## 2017-05-05 DIAGNOSIS — M170 Bilateral primary osteoarthritis of knee: Secondary | ICD-10-CM | POA: Diagnosis not present

## 2017-05-05 DIAGNOSIS — R188 Other ascites: Secondary | ICD-10-CM | POA: Diagnosis not present

## 2017-05-05 NOTE — Telephone Encounter (Signed)
Copied from [place] [id number]. Topic: Quick Communication - See Telephone Encounter >> May 05, 2017  4:28 PM Bea Graff, NT wrote: Reason for CRM: Home Health is needing a rx filled out for the pt to have oxygen in her home, a ramp built in her home, and to apply to get a small scooter.

## 2017-05-05 NOTE — Telephone Encounter (Signed)
Will need proper documentation for the items, usually its done by having the medical supply store or other agency sending [place] a statement of need etc, then we can fill out the paperwork.

## 2017-05-06 ENCOUNTER — Telehealth: Payer: Self-pay | Admitting: Family Medicine

## 2017-05-06 DIAGNOSIS — R188 Other ascites: Secondary | ICD-10-CM | POA: Diagnosis not present

## 2017-05-06 DIAGNOSIS — I5033 Acute on chronic diastolic (congestive) heart failure: Secondary | ICD-10-CM | POA: Diagnosis not present

## 2017-05-06 DIAGNOSIS — K3189 Other diseases of stomach and duodenum: Secondary | ICD-10-CM | POA: Diagnosis not present

## 2017-05-06 DIAGNOSIS — I110 Hypertensive heart disease with heart failure: Secondary | ICD-10-CM | POA: Diagnosis not present

## 2017-05-06 DIAGNOSIS — M170 Bilateral primary osteoarthritis of knee: Secondary | ICD-10-CM | POA: Diagnosis not present

## 2017-05-06 DIAGNOSIS — K7581 Nonalcoholic steatohepatitis (NASH): Secondary | ICD-10-CM | POA: Diagnosis not present

## 2017-05-06 NOTE — Telephone Encounter (Unsigned)
Copied from [place] [id number]. Topic: Inquiry >> May 06, 2017  4:49 PM Neva Seat wrote: Pt dropped off paperwork> Pt is still waiting for paperwork so she can get the items below  oxygen in her home, a ramp built in her home, and to apply to get a small scooter  The paperwork needs a signature from Dr. Manuella Ghazi

## 2017-05-07 ENCOUNTER — Encounter: Payer: Self-pay | Admitting: Interventional Radiology

## 2017-05-07 ENCOUNTER — Ambulatory Visit
Admission: RE | Admit: 2017-05-07 | Discharge: 2017-05-07 | Disposition: A | Discharge status: Home / Self Care | Payer: Medicare HMO | Source: Ambulatory Visit | Attending: Gastroenterology | Admitting: Gastroenterology

## 2017-05-07 DIAGNOSIS — K7469 Other cirrhosis of liver: Secondary | ICD-10-CM | POA: Diagnosis not present

## 2017-05-07 DIAGNOSIS — R188 Other ascites: Secondary | ICD-10-CM | POA: Diagnosis not present

## 2017-05-07 DIAGNOSIS — F419 Anxiety disorder, unspecified: Secondary | ICD-10-CM | POA: Diagnosis not present

## 2017-05-07 DIAGNOSIS — E785 Hyperlipidemia, unspecified: Secondary | ICD-10-CM | POA: Diagnosis not present

## 2017-05-07 DIAGNOSIS — M170 Bilateral primary osteoarthritis of knee: Secondary | ICD-10-CM | POA: Insufficient documentation

## 2017-05-07 DIAGNOSIS — Z6841 Body Mass Index (BMI) 40.0 and over, adult: Secondary | ICD-10-CM | POA: Insufficient documentation

## 2017-05-07 DIAGNOSIS — Z7982 Long term (current) use of aspirin: Secondary | ICD-10-CM | POA: Diagnosis not present

## 2017-05-07 DIAGNOSIS — I110 Hypertensive heart disease with heart failure: Secondary | ICD-10-CM | POA: Diagnosis not present

## 2017-05-07 DIAGNOSIS — Z8249 Family history of ischemic heart disease and other diseases of the circulatory system: Secondary | ICD-10-CM | POA: Diagnosis not present

## 2017-05-07 DIAGNOSIS — K7460 Unspecified cirrhosis of liver: Secondary | ICD-10-CM | POA: Insufficient documentation

## 2017-05-07 DIAGNOSIS — Z87891 Personal history of nicotine dependence: Secondary | ICD-10-CM | POA: Diagnosis not present

## 2017-05-07 DIAGNOSIS — I5032 Chronic diastolic (congestive) heart failure: Secondary | ICD-10-CM | POA: Insufficient documentation

## 2017-05-07 HISTORY — PX: IR TRANSCATHETER BX: IMG713

## 2017-05-07 MED ORDER — MIDAZOLAM HCL 2 MG/2ML IJ SOLN
INTRAMUSCULAR | Status: AC | PRN
  Administered 2017-05-07 (×3): 1 mg via INTRAVENOUS

## 2017-05-07 MED ORDER — IOPAMIDOL (ISOVUE-300) INJECTION 61%
30.0000 mL | Freq: Once | INTRAVENOUS | Status: AC | PRN
  Administered 2017-05-07: 5 mL

## 2017-05-07 MED ORDER — FENTANYL CITRATE (PF) 100 MCG/2ML IJ SOLN
INTRAMUSCULAR | Status: AC | PRN
  Administered 2017-05-07: 25 ug via INTRAVENOUS
  Administered 2017-05-07: 50 ug via INTRAVENOUS
  Administered 2017-05-07 (×3): 25 ug via INTRAVENOUS

## 2017-05-07 MED ORDER — SODIUM CHLORIDE 0.9 % IV SOLN
INTRAVENOUS | Status: DC
  Administered 2017-05-07: 12:00:00 via INTRAVENOUS

## 2017-05-07 MED ORDER — LIDOCAINE HCL (PF) 1 % IJ SOLN
INTRAMUSCULAR | Status: AC | PRN
  Administered 2017-05-07: 5 mL

## 2017-05-07 MED ORDER — FENTANYL CITRATE (PF) 100 MCG/2ML IJ SOLN
INTRAMUSCULAR | Status: AC
  Filled 2017-05-07: qty 2

## 2017-05-07 MED ORDER — MIDAZOLAM HCL 2 MG/2ML IJ SOLN
INTRAMUSCULAR | Status: AC
  Filled 2017-05-07: qty 4

## 2017-05-07 NOTE — H&P (Signed)
Chief Complaint: Here for transjugular liver biopsy.  Referring Physician(s): Lin Landsman  Patient Status: [facility] - Out-pt  History of Present Illness: [facility] is a 61 y.o. female was seen on 05/04/17 by Dr. Anselm Pancoast for possible percutaneous random ultrasound guided liver biopsy for assessment of cirrhosis.  Due to elevated INR of 1.63, ascites and platelet count of 72 K, decision made to convert to a transjugular biopsy to reduce bleeding risk.  Past Medical History:  Diagnosis Date  . Anxiety   . Arthritis    R knee, L knee - OA  . Chronic diastolic CHF (congestive heart failure) ([place])    a. 06/2016 Echo: EF 60-65%, no rwma, mod MR, mildly dil LA, nl RV fxn, PASP 31mmHg.  [name] Cirrhosis ([place])   . Dysrhythmia    "mild arrythmia after taking Redux diet med years ago"no cardio fi  . Headache(784.0)   . History of stress test    a. 06/2016 MV: EF 67%, no ischemia/infarct.  . Hyperlipidemia   . Hypertension    echoJefm [facility], wnl 2009? , had taken Redux for diet management  but now  off the market. pt, told that echo wnl.    . Moderate mitral regurgitation    a. 06/2016 Echo: EF 60-65%, mod MR.  . Morbid obesity ([place])   . Osteoarthritis   . Recurrent upper respiratory infection (URI)    treated /w OTC med.     Past Surgical History:  Procedure Laterality Date  . ABDOMINAL HYSTERECTOMY     partial-uterus removed  . CHOLECYSTECTOMY     2011  . COCCYX REMOVAL     [facility]  . COLONOSCOPY WITH PROPOFOL N/A 03/11/2017   Procedure: COLONOSCOPY WITH PROPOFOL;  Surgeon: Lin Landsman, MD;  Location: [facility];  Service: Gastroenterology;  Laterality: N/A;  . ESOPHAGOGASTRODUODENOSCOPY (EGD) WITH PROPOFOL N/A 03/11/2017   Procedure: ESOPHAGOGASTRODUODENOSCOPY (EGD) WITH PROPOFOL;  Surgeon: Lin Landsman, MD;  Location: [facility];  Service: Gastroenterology;  Laterality: N/A;  . EYE SURGERY     states both lens replaced. Not sure if cataract surgery  or not.  [name] HEEL SPUR SURGERY    . I&D EXTREMITY  08/27/2011   Procedure: IRRIGATION AND DEBRIDEMENT EXTREMITY;  Surgeon: [name], MD;  Location: [facility];  Service: Orthopedics;  Laterality: Right;  . JOINT REPLACEMENT Right 2012   Right TKR  . KNEE ARTHROPLASTY  07/28/2011   Procedure: COMPUTER ASSISTED TOTAL KNEE ARTHROPLASTY;  Surgeon: [name], MD;  Location: [facility];  Service: Orthopedics;  Laterality: Right;  right total knee arthroplasty, computer assisted  . KNEE ARTHROSCOPY     right knee  . TMJ ARTHROPLASTY     [facility]- 1997    Allergies: Bacitracin-neomycin-polymyxin; Codeine; Gabapentin; Neosporin  [neomycin-bacitracin zn-polymyx]; and Pregabalin  Medications: Prior to Admission medications   Medication Sig Start Date End Date Taking? Authorizing Provider  albuterol (PROVENTIL HFA;VENTOLIN HFA) 108 (90 Base) MCG/ACT inhaler Inhale 2 puffs into the lungs every 6 (six) hours as needed for wheezing or shortness of breath. 08/29/16  Yes Wieting, Richard, MD  alprazolam Duanne Moron) 2 MG tablet Take 1 tablet (2 mg total) by mouth 2 (two) times daily as needed for anxiety. 04/19/17 05/19/17 Yes Roselee Nova, MD  aspirin 81 MG EC tablet Take 1 tablet (81 mg total) by mouth daily. 08/30/16  Yes Wieting, Richard, MD  atenolol (TENORMIN) 100 MG tablet TAKE 1 TABLET (100 MG TOTAL) BY MOUTH  DAILY. 03/29/17  Yes Roselee Nova, MD  cephALEXin (KEFLEX) 500 MG capsule Take 1 capsule (500 mg total) by mouth every 12 (twelve) hours. 04/07/17  Yes Fritzi Mandes, MD  citalopram (CELEXA) 10 MG tablet Take 1 tablet (10 mg total) by mouth daily. 02/04/17  Yes Roselee Nova, MD  cyclobenzaprine (FLEXERIL) 5 MG tablet TAKE ONE AND ONE-HALF TABLETS BY MOUTH EVERY NIGHT AT BEDTIME 03/04/17  Yes Roselee Nova, MD  furosemide (LASIX) 40 MG tablet Take 2 tablets (80 mg total) by mouth daily. In the morning 04/02/17 07/01/17 Yes Vanga, Tally Due, MD  furosemide (LASIX) 80 MG tablet Take 80  mg by mouth daily. 04/19/17  Yes [provider]  hydrOXYzine (ATARAX/VISTARIL) 25 MG tablet Take 1 tablet (25 mg total) by mouth every 8 (eight) hours as needed. 01/07/17  Yes Keith Rake Asad A, MD  KLOR-CON M20 20 MEQ tablet Take 60 mEq by mouth daily. 03/25/17  Yes [provider]  lactulose (CHRONULAC) 10 GM/15ML solution Take 15 mLs (10 g total) by mouth 2 (two) times daily. 04/07/17  Yes Fritzi Mandes, MD  Magnesium Oxide 400 (240 Mg) MG TABS TAKE 1 TABLET BY MOUTH TWICE A DAY 03/30/17  Yes Vanga, Tally Due, MD  oxyCODONE (OXY IR/ROXICODONE) 5 MG immediate release tablet Take 1 tablet (5 mg total) by mouth every 8 (eight) hours as needed for moderate pain. 04/19/17 05/19/17 Yes Roselee Nova, MD  spironolactone (ALDACTONE) 100 MG tablet Take 1 tablet (100 mg total) by mouth daily. 04/07/17 07/06/17 Yes Fritzi Mandes, MD  Vitamin D, Ergocalciferol, (DRISDOL) 50000 units CAPS capsule Take 1 capsule (50,000 Units total) by mouth every 7 (seven) days. 03/03/17 06/17/17 Yes Vanga, Tally Due, MD  omeprazole (PRILOSEC) 40 MG capsule Take 1 capsule (40 mg total) by mouth 2 (two) times daily before a meal. Patient not taking: Reported on 05/04/2017 03/11/17 06/09/17  Lin Landsman, MD  polyethylene glycol (MIRALAX / Floria Raveling) packet Take 17 g by mouth daily as needed for mild constipation. Patient not taking: Reported on 04/19/2017 04/07/17   Fritzi Mandes, MD  traZODone (DESYREL) 150 MG tablet Take 1 tablet by mouth daily as needed. 10/13/13   [provider]     Family History  Problem Relation Age of Onset  . Heart disease Mother   . Hypertension Mother   . Hypertension Father   . Heart disease Sister 28       stent   . Heart attack Sister   . Hyperlipidemia Sister   . Hypertension Sister   . Heart attack Maternal Uncle 64  . Heart disease Maternal Uncle        CABG    Social History   Social History  . Marital status: Married    Spouse name: N/A  . Number of  children: N/A  . Years of education: N/A   Social History Main Topics  . Smoking status: Former Smoker    Packs/day: 0.25    Years: 50.00    Types: Cigarettes    Quit date: 03/20/2017  . Smokeless tobacco: Never Used  . Alcohol use No  . Drug use: No  . Sexual activity: Not Currently   Other Topics Concern  . Not on file   Social History Narrative   Lives in [place] with husband.  Does not routinely exercise.    Review of Systems: A 12 point ROS discussed and pertinent positives are indicated in the HPI above.  All other systems are negative.  Review of Systems  Constitutional: Negative.   Respiratory: Negative.   Cardiovascular: Negative.   Gastrointestinal: Negative.   Genitourinary: Negative.   Musculoskeletal: Negative.   Neurological: Negative.     Vital Signs: BP (!) 127/57   Pulse 74   Temp 98.4 F (36.9 C) (Oral)   Wt 272 lb (123.4 kg)   BMI 41.36 kg/m   Physical Exam  Constitutional: She is oriented to person, place, and time. No distress.  Eyes: Scleral icterus is present.  Neck: Neck supple. No JVD present.  Cardiovascular: Normal rate, regular rhythm and normal heart sounds.  Exam reveals no gallop and no friction rub.   No murmur heard. Pulmonary/Chest: Effort normal and breath sounds normal. No respiratory distress. She has no wheezes. She has no rales.  Abdominal: Soft. Bowel sounds are normal. There is no tenderness. There is no rebound and no guarding.  Musculoskeletal: She exhibits no edema.  Neurological: She is alert and oriented to person, place, and time.  Skin: Skin is warm and dry. She is not diaphoretic.  Jaundiced    Imaging: No results found.  Labs:  CBC:  Recent Labs  04/02/17 1220 04/03/17 1309 04/05/17 0358 05/04/17 0936  WBC 4.9 5.9 7.3 7.4  HGB 11.9* 12.7 10.5* 11.9*  HCT 34.2* 35.2 29.6* 35.2  PLT 60* 105* 47* 72*    COAGS:  Recent Labs  02/26/17 1645 05/04/17 0936  INR 1.82 1.63    BMP:  Recent  Labs  04/02/17 1220 04/03/17 1413 04/04/17 0630 04/04/17 1145 04/05/17 0358  NA 136 136 132*  --  136  K 5.0 5.5* 6.7* 4.7 4.4  CL 103 104 102  --  101  CO2 26 24 23   --  31  GLUCOSE 136* 152* 144*  --  156*  BUN 16 20 27*  --  26*  CALCIUM 9.2 8.7* 8.5*  --  8.0*  CREATININE 1.07* 0.92 1.05*  --  0.91  GFRNONAA 54* >60 56*  --  >60  GFRAA >60 >60 >60  --  >60    LIVER FUNCTION TESTS:  Recent Labs  03/05/17 1626 04/02/17 1220 04/03/17 1413 04/05/17 0358  BILITOT 6.7* 5.3* 6.4* 5.2*  AST 45* 49* 55* 38  ALT 27 30 30 25   ALKPHOS 150* 161* 139* 103  PROT 6.9 6.7 6.4* 5.6*  ALBUMIN 2.9* 2.9* 2.7* 2.4*    Assessment and Plan:  For transjugular hepatic venography and transcatheter biopsy of liver today.  Risks and benefits discussed with the patient including, but not limited to bleeding, infection, damage to adjacent structures or low yield requiring additional tests. All of the patient's questions were answered, patient is agreeable to proceed.Consent signed and in chart.  Electronically Signed: Azzie Roup, MD 05/07/2017, 12:54 PM   I spent a total of 15 Minutes in face to face in clinical consultation, greater than 50% of which was counseling/coordinating care for transjugular liver biopsy.

## 2017-05-07 NOTE — Procedures (Signed)
Interventional Radiology Procedure Note  Procedure: Hepatic venography and transcatheter liver biopsy   Complications: None  Estimated Blood Loss: < 10 mL  Middle hepatic vein normally patent.  Transjugular liver biopsy system advanced through 10 Fr angled TIPS sheath into hepatic vein. 47 G core biopsy x 3 performed in right lobe.  Solid tissue obtained.  Venetia Night. Kathlene Cote, M.D Pager:  [phone]

## 2017-05-07 NOTE — OR Nursing (Signed)
Dr Kathlene Cote reviewed labs from Tuesday, no new labs needed

## 2017-05-07 NOTE — Telephone Encounter (Signed)
Left a detail messaged explaining what Dr. Manuella Ghazi needs in order for the pt to have the items requested.

## 2017-05-10 ENCOUNTER — Ambulatory Visit: Payer: Medicare HMO | Admitting: Gastroenterology

## 2017-05-10 DIAGNOSIS — K7581 Nonalcoholic steatohepatitis (NASH): Secondary | ICD-10-CM | POA: Diagnosis not present

## 2017-05-10 DIAGNOSIS — M170 Bilateral primary osteoarthritis of knee: Secondary | ICD-10-CM | POA: Diagnosis not present

## 2017-05-10 DIAGNOSIS — R188 Other ascites: Secondary | ICD-10-CM | POA: Diagnosis not present

## 2017-05-10 DIAGNOSIS — I110 Hypertensive heart disease with heart failure: Secondary | ICD-10-CM | POA: Diagnosis not present

## 2017-05-10 DIAGNOSIS — I5033 Acute on chronic diastolic (congestive) heart failure: Secondary | ICD-10-CM | POA: Diagnosis not present

## 2017-05-10 DIAGNOSIS — K3189 Other diseases of stomach and duodenum: Secondary | ICD-10-CM | POA: Diagnosis not present

## 2017-05-11 ENCOUNTER — Telehealth: Payer: Self-pay

## 2017-05-11 NOTE — Telephone Encounter (Signed)
Drop in patient's oxygen levels could likely be a result of congestive heart failure, she is being followed by the CHF clinic. Any concerns should be directed towards the responsible provider.

## 2017-05-11 NOTE — Telephone Encounter (Signed)
Copied from [place] [id number]. Topic: Inquiry >> May 11, 2017 11:17 AM Aurelio Brash B wrote: Reason for CRM:   [place]    A PT from [facility] called concerning pts 02 levels     Oxygen reading drops down to 85. Goes up after 1 min. 20 sec. She is feeling light headed  She needs to know if there is a plan to have her on 02   She called last week and is still waiting to find out  Also Pt took a fall over the weekend

## 2017-05-13 ENCOUNTER — Other Ambulatory Visit: Payer: Self-pay

## 2017-05-13 ENCOUNTER — Encounter: Payer: Self-pay | Admitting: Gastroenterology

## 2017-05-13 ENCOUNTER — Ambulatory Visit (INDEPENDENT_AMBULATORY_CARE_PROVIDER_SITE_OTHER): Payer: Medicare HMO | Admitting: Gastroenterology

## 2017-05-13 VITALS — BP 81/51 | HR 97 | Ht 68.0 in | Wt 271.6 lb

## 2017-05-13 DIAGNOSIS — K7290 Hepatic failure, unspecified without coma: Secondary | ICD-10-CM

## 2017-05-13 DIAGNOSIS — K7682 Hepatic encephalopathy: Secondary | ICD-10-CM

## 2017-05-13 DIAGNOSIS — K7469 Other cirrhosis of liver: Secondary | ICD-10-CM | POA: Diagnosis not present

## 2017-05-13 MED ORDER — RIFAXIMIN 550 MG PO TABS: 550.0000 mg | ORAL_TABLET | Freq: Two times a day (BID) | ORAL | 2 refills | Status: AC

## 2017-05-13 NOTE — Progress Notes (Signed)
Cephas Darby, MD [address]  [place]  [place], [place] [address]  Main: [phone]  Fax: [phone]    Gastroenterology Consultation  Referring Provider:     Roselee Nova, MD Primary Care Physician:  Roselee Nova, MD Primary Gastroenterologist:  Dr. Cephas Darby Reason for Consultation:    Cirrhosis        HPI:   Leah Shah is a 61 y.o. y/o female referred for consultation & management  by Dr. Manuella Ghazi, [place], MD for recent diagnosis of cirrhosis of liver. She is morbidly obese, and has been gaining excessive weight secondary to progressive swelling of legs which started beginning of 2018 for which she was admitted to the hospital and diuresed with IV diuretics. She was discharged home on furosemide. She also has been experiencing a lot of mood changes and closely followed by Dr. Donneta Romberg. She is diagnosed with anxiety and depression and started her on antidepressants. She has not been able to get rid of fluid on current dose of diuretics. She consumes high sodium in her diet. She reports insomnia as she wakes up almost every hour during night since taking Lasix at bedtime. She is accompanied by her niece today. Her imaging studies suggest fatty liver and hepatomegaly from prior, and recently from ultrasound in July 2018 revealed nodular liver and portal hypertension. Therefore she is sent here for further management.  She otherwise denies fever, chills, nausea, vomiting, hematemesis, melena, rectal bleeding, shortness of breath, chest pain, headache, tingling or numbness. She reports easy bruising. She does report constipation which is relieved with stool softeners. She reports undergoing EGD several years ago but never had a colonoscopy.   First noted abnormality in LFT's in 06/2010 .  Alcohol use in the past, socially, quit 75yrs ago  Drug use none Over the counter herbal supplements Azo yeast supplements New medications n/a Abdominal pain lower abdomen  when constipated Tattoos yes, on left LE, lateral side of lower leg Military service none Prior blood transfusion none Incarceration none History of travel none Family history of liver disease none Recent change in weight gained from swelling of legs  Follow up visit 03/03/2017: She reports significant improvement in her abdominal distention, swelling of legs, her weight has decreased from 305lbs to 277.6lbs in 2 weeks. Her potassium and magnesium were low over the weekend, and a repeated them. Repeat labs came back normal. She is currently on furosemide 80 mg and spironolactone 200 mg daily she continues to have large urine output, up to 10 times a day. She is able to sleep better at night, less daytime somnolence. Her labs also revealed that she has elevated total bilirubin and predominantly indirect bilirubin. She denies abdominal pain, fever, chills, dark urine. She is adherent to low-salt diet. She is accompanied by her sister and daughter-in-law today. The workup for secondary liver disease showed that her ceruloplasmin levels are mildly low. Her AFP levels are elevated. She continues to smoke cigarettes.  Follow up visit 04/02/2017: Since her last follow-up, she underwent EGD and a colonoscopy. Results as mentioned below. Her H. pylori serologies were positive. Therefore, I started triple therapy given the the presence of duodenal ulcer. She has not started amoxicillin and clarithromycin both at the same time because there was daily for approval of clarithromycin from her insurance company. She reports taking Prilosec 2 times daily. With regards to her cirrhosis, she has been taking furosemide 60 mg and spironolactone 150 mg in  the morning. Her swelling of legs has worsened since I last saw her as she has been eating pickles. She also skipped doses on furosemide due to travel as her father-in-law passed away recently. She gained about 7 pounds since last visit. She has stopped smoking cigarettes  as her shortness of breath got worse. She is accompanied by her sister today. She has not undergone a liver biopsy yet itching ordered to evaluate for copper quantification as her 24-hour urine copper levels were elevated. She was seen by the [facility] and reportedly there was no evidence of KF ring. She also underwent MRI liver mass protocol and there was no evidence of hepatocellular carcinoma. She also received first dose of hepatitis B vaccine series. She otherwise denies any complaints today with regards to her chronic liver disease.   Follow-up visit 05/13/2017: She was recently admitted to the hospital about a month ago secondary to hyperkalemia with EKG changes as well as acute cystitis. She was discharged to acute rehabilitation which did not like and that was later switched to home PT. She recently had a liver biopsy and followed by that she fell at home which resulted in large bruise in her right knee. She denied loss of consciousness or head injury. She said she fell once even before this since hospital discharge. However, she has been lethargic during the day and daytime somnolence. She continues to take Lasix 80 mg and spironolactone 100 mg a plan with potassium 20 mg daily. She continues to take lactulose 15 mg twice daily. She denies hematemesis, melena. She denies abdominal pain, distention, fever, chills, nausea, vomiting. She does have bilateral swelling of legs which is unchanged. She denies any weight gain, in fact lost about 5 pounds since last clinic visit.  GI procedures: EGD 03/11/2017 - One non-bleeding duodenal ulcer with a clean ulcer base (Forrest Class III). - Portal hypertensive gastropathy. - Type 1 isolated gastric varices (IGV1, varices located in the fundus), without bleeding. - LA Grade A reflux esophagitis. - No specimens collected.  Colonoscopy 03/11/2017  - Non-thrombosed external hemorrhoids found on perianal exam. - Stool in the sigmoid colon. - One  6 mm polyp in the rectum, removed with a cold snare. Resected and retrieved. - The distal rectum and anal verge are normal on retroflexion view.  DIAGNOSIS:  A. RECTUM POLYP; COLD SNARE:  - TUBULOVILLOUS ADENOMA.  - NEGATIVE FOR HIGH-GRADE DYSPLASIA AND MALIGNANCY.   Transjugular liver biopsy 05/07/2017 DIAGNOSIS:  A. LIVER; TRANSJUGULAR BIOPSY:  - CHRONIC HEPATITIS, ETIOLOGY UNKNOWN, GRADE 2, STAGE 4 (CIRRHOSIS).  Quantitative copper analysis is pending Sections demonstrate cirrhosis with areas of parenchymal extinction.  Trichrome stain confirms cirrhosis and highlights focal areas of  pericellular fibrosis. Macrovesicular steatosis involves approximately  5% of the parenchyma without evidence of active steatohepatitis. Bile  ducts are unremarkable and cholestasis is not identified. Mild grade 1  iron deposition is present within focal hepatocytes. PASD  intracytoplasmic globules are not identified. Stain controls worked  appropriately.   Past Medical History:  Diagnosis Date  . Anxiety   . Arthritis    R knee, L knee - OA  . Chronic diastolic CHF (congestive heart failure) ([place])    a. 06/2016 Echo: EF 60-65%, no rwma, mod MR, mildly dil LA, nl RV fxn, PASP 51mmHg.  [name] Cirrhosis ([place])   . Dysrhythmia    "mild arrythmia after taking Redux diet med years ago"no cardio fi  . Headache(784.0)   . History of stress test  a. 06/2016 MV: EF 67%, no ischemia/infarct.  . Hyperlipidemia   . Hypertension    echoJefm Bryant, wnl 2009? , had taken Redux for diet management  but now  off the market. pt, told that echo wnl.    . Moderate mitral regurgitation    a. 06/2016 Echo: EF 60-65%, mod MR.  . Morbid obesity ([place])   . Osteoarthritis   . Recurrent upper respiratory infection (URI)    treated /w OTC med.     Past Surgical History:  Procedure Laterality Date  . ABDOMINAL HYSTERECTOMY     partial-uterus removed  . CHOLECYSTECTOMY     2011  . [facility]X REMOVAL     [facility]  .  EYE SURGERY     states both lens replaced. Not sure if cataract surgery or not.  [name] HEEL SPUR SURGERY    . IR TRANSCATHETER BX  05/07/2017  . JOINT REPLACEMENT Right 2012   Right TKR  . KNEE ARTHROSCOPY     right knee  . TMJ ARTHROPLASTY     [place]    Prior to Admission medications   Medication Sig Start Date End Date Taking? Authorizing Provider  albuterol (PROVENTIL HFA;VENTOLIN HFA) 108 (90 Base) MCG/ACT inhaler Inhale 2 puffs into the lungs every 6 (six) hours as needed for wheezing or shortness of breath. 08/29/16   Loletha Grayer, MD  alprazolam Duanne Moron) 2 MG tablet Take 1 tablet (2 mg total) by mouth 2 (two) times daily as needed for anxiety. 02/04/17   Roselee Nova, MD  aspirin 81 MG EC tablet Take 1 tablet (81 mg total) by mouth daily. 08/30/16   Loletha Grayer, MD  atenolol (TENORMIN) 100 MG tablet TAKE 1 TABLET (100 MG TOTAL) BY MOUTH DAILY. 12/28/16   Roselee Nova, MD  citalopram (CELEXA) 10 MG tablet Take 1 tablet (10 mg total) by mouth daily. 02/04/17   Roselee Nova, MD  cyclobenzaprine (FLEXERIL) 5 MG tablet TAKE ONE AND ONE-HALF TABLETS BY MOUTH EVERY NIGHT AT BEDTIME 02/09/17   Lada, Satira Anis, MD  furosemide (LASIX) 40 MG tablet Take 1 tablet (40 mg total) by mouth 2 (two) times daily. Take 60 mg in morning, 40 mg at night Patient taking differently: Take 40 mg by mouth daily. Take 60 mg in morning, 40 mg at night 10/12/16   Roselee Nova, MD  hydrOXYzine (ATARAX/VISTARIL) 25 MG tablet Take 1 tablet (25 mg total) by mouth every 8 (eight) hours as needed. 01/07/17   Roselee Nova, MD  losartan (COZAAR) 50 MG tablet TAKE 1 TABLET (50 MG TOTAL) BY MOUTH DAILY. 11/09/16   Roselee Nova, MD  oxyCODONE (OXY IR/ROXICODONE) 5 MG immediate release tablet Take 1 tablet (5 mg total) by mouth every 8 (eight) hours as needed for moderate pain. 02/04/17   Roselee Nova, MD  potassium chloride SA (K-DUR,KLOR-CON) 20 MEQ tablet Take 1 tablet (20 mEq total) by mouth 2  (two) times daily. Patient taking differently: Take 20 mEq by mouth once.  11/09/16   Roselee Nova, MD  simvastatin (ZOCOR) 40 MG tablet TAKE 1 TABLET (40 MG TOTAL) BY MOUTH DAILY AT 6 PM. 12/28/16   [provider]    Family History  Problem Relation Age of Onset  . Heart disease Mother   . Hypertension Mother   . Hypertension Father   . Heart disease Sister 110       stent   .  Heart attack Sister   . Hyperlipidemia Sister   . Hypertension Sister   . Heart attack Maternal Uncle 64  . Heart disease Maternal Uncle        CABG     Social History   Tobacco Use  . Smoking status: Former Smoker    Packs/day: 0.25    Years: 50.00    Pack years: 12.50    Types: Cigarettes    Last attempt to quit: 03/20/2017    Years since quitting: 0.1  . Smokeless tobacco: Never Used  Substance Use Topics  . Alcohol use: No  . Drug use: No    Allergies as of 05/13/2017 - Review Complete 05/13/2017  Allergen Reaction Noted  . Bacitracin-neomycin-polymyxin Other (See Comments) 03/14/2015  . Codeine Other (See Comments) 05/12/2011  . Gabapentin Nausea Only 12/13/2014  . Neosporin  [neomycin-bacitracin zn-polymyx]  12/13/2014  . Pregabalin Other (See Comments) 12/13/2014    Review of Systems:    All systems reviewed and negative except where noted in HPI.   Physical Exam:  BP (!) 81/51   Pulse 97   Ht 5\' 8"  (1.727 m)   Wt 271 lb 9.6 oz (123.2 kg)   BMI 41.30 kg/m  No LMP recorded. Patient has had a hysterectomy. Psych:  Alert and cooperative. Normal mood and affect, obese. General:   Alert,  Well-developed, well-nourished, pleasant and cooperative in NAD Head:  Normocephalic and atraumatic. Eyes:  Sclera clear, positive icterus.   Conjunctiva pink. Ears:  Normal auditory acuity. Nose:  No deformity, discharge, or lesions Mouth:  No deformity or lesions,oropharynx pink & moist. Neck:  Supple; no masses or thyromegaly, short and thick, double chin Lungs:  Respirations even  and unlabored.  Clear throughout to auscultation.   No wheezes, crackles, or rhonchi. No acute distress. Heart:  Regular rate and rhythm; no murmurs, clicks, rubs, or gallops. Abdomen:  Normal bowel sounds.  No bruits.  Soft, non-tender and non-distended without masses, hepatosplenomegaly or hernias noted.  No guarding or rebound tenderness, redundant skin in lower abdomen.    Msk:  Symmetrical without gross deformities. Good, equal movement & strength bilaterally. Pulses:  Normal pulses noted. Extremities:  No clubbing, 3+ edema in bilateral lower extremities, has 3+ pedal edema.  No cyanosis, large bruise in her right leg involving knee joint as well as above and below the knee joint. Both legs appears symmetrical, and nontender Neurologic:  Alert and oriented x3;  grossly normal neurologically. Skin:  Intact without significant lesions, large purpurea in upper extremities and right lower extremity Psych:  Alert and cooperative. Normal mood and affect.  Imaging Studies: Ultrasound abdomen right upper quadrant 01/08/2017 IMPRESSION: Diffuse increased echotexture of the liver which can be fatty infiltration of liver. There is nodular contour of liver with hepatofugal flow in the portal plain suggesting changes of cirrhosis and portal hypertension.  MRI liver mass protocol 03/17/2017 IMPRESSION: 1. Morphologic changes of hepatic cirrhosis.  No liver masses. 2. Large splenorenal shunt. Focal gastric wall varices in the posterior gastric cardia region. 3. No ascites.  Top-normal size spleen. 4. Lobulated nonenhancing 1.9 x 0.9 x 0.9 cm posterior pancreatic head cystic lesion. No high risk MRI features. Follow-up MRI abdomen without and with IV contrast recommended in 6 months.  Assessment and Plan:   Leah Shah  61 y.o. 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, copper quantification on liver biopsy pending Ceruloplasmin levels are mildly low at 16.8, 24-hour urine copper revealed elevated copper levels Copper,Urine 24 Hr       474     [H ] ug/24 hr BN    Reference Range: 3-35                  There was no evidence of KF King per ophthalmologist  Other secondary causes of liver disease revealed normal  iron & ferritin, Hepatitis total A Ab positive, Hepatitis B surface Ag negative, Hepatitis B surface Ab mildly positive,& Hepatitis C Ab negative,HIV negative, ANA negative, AMA & anti-SMA negative, LKM ab negative, Immunoglobulins normal, A-1 antitrypsin normal, celiac serology negative.   Portal hypertension:  Volume overload/Ascites: Hypervolemic, Worsened bilateral swelling of legs secondary to high sodium intake, pickles. Recommend to continue 2gm sodium diet and avoid pickles. She will continue furosemide to 80 mg in the morning and spironolactone 100 mg in the morning. She will continue oral potassium and magnesium daily. I will check BMP and magnesium in 4 weeks   PSE: Concern for worsening of encephalopathy due to frequent falls. Continue lactulose with a goal to have 1 to 2 formed stools daily. Start rifaximin 550 mg twice a day Anemia/Thrombocytopenia/Coagulopathy: Has severe portal hypertensive gastropathy and duodenal ulcer. 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 Mild coagulopathy. Counts are stable Varices: EGD 03/2017 revealed it to severe portal hypertensive gastropathy, isolated gastric varix 1. Repeat EGD in 1 year  HRS: Normal creatinine HCC screening: Up to date with ultrasound in July 2018, AFP elevated to 13.3. MRI liver mass protocol performed on 03/17/2017, no evidence of HCC. CT none   Health maintenance: Hepatitis A/B vaccine status: Immune to hepatitis A, received 2 doses of hepatitis B  vaccine series From Dr. Audelia Hives office. Influenza vaccine: Received in 05/2016, recommend annual influenza vaccine  Pneumonia vaccine: Received PPSV-23 in 08/2016 Colonoscopy: subcentimeter tubulovillous adenoma in rectosigmoid, recommend colonoscopy in 3 years  Vitamin D levels: Very low at 7, replaced with 50,000 units weekly for 3 months and recheck levels in 3 months Abstinence from ETOH: Encouraged to stay away from alcohol, quit more than 10 years ago Avoid NSAIDs 2D Echo: Performed on 21/22/4825, mild diastolic dysfunction, mild MR, left atrium moderately dilated, PA pressure elevated Smoking:  She quit smoking at present Peptic ulcer disease: Clean based duodenal ulcer, H. pylori serology positive status post triple therapy  Reflux esophagitis: LA grade A, she completed omeprazole 40 mg twice a day for 2 months  Evaluate for liver transplant: Pending above workup   Follow up in 4 weeks  Cephas Darby, MD

## 2017-05-13 NOTE — Telephone Encounter (Signed)
Tried to reach out to Leah Shah, PT from [facility] with he number that is provided but I keep getting a message stating this is not a working number.

## 2017-05-14 ENCOUNTER — Ambulatory Visit: Payer: Medicare HMO | Admitting: Gastroenterology

## 2017-05-17 ENCOUNTER — Encounter: Payer: Self-pay | Admitting: Gastroenterology

## 2017-05-17 ENCOUNTER — Other Ambulatory Visit: Payer: Self-pay

## 2017-05-17 DIAGNOSIS — K3189 Other diseases of stomach and duodenum: Secondary | ICD-10-CM | POA: Diagnosis not present

## 2017-05-17 DIAGNOSIS — K7581 Nonalcoholic steatohepatitis (NASH): Secondary | ICD-10-CM | POA: Diagnosis not present

## 2017-05-17 DIAGNOSIS — I110 Hypertensive heart disease with heart failure: Secondary | ICD-10-CM | POA: Diagnosis not present

## 2017-05-17 DIAGNOSIS — M170 Bilateral primary osteoarthritis of knee: Secondary | ICD-10-CM | POA: Diagnosis not present

## 2017-05-17 DIAGNOSIS — I5033 Acute on chronic diastolic (congestive) heart failure: Secondary | ICD-10-CM | POA: Diagnosis not present

## 2017-05-17 DIAGNOSIS — R188 Other ascites: Secondary | ICD-10-CM | POA: Diagnosis not present

## 2017-05-17 LAB — SURGICAL PATHOLOGY

## 2017-05-18 ENCOUNTER — Encounter: Payer: Self-pay | Admitting: Interventional Radiology

## 2017-05-19 ENCOUNTER — Telehealth: Payer: Self-pay | Admitting: Gastroenterology

## 2017-05-19 NOTE — Telephone Encounter (Signed)
Please call patient as she is confused on what medications to take.

## 2017-05-19 NOTE — Telephone Encounter (Signed)
Hi Dr. Marius Ditch,  Zulma said she doesn't recall the medication changes you asked her to make during the phone call that took place the other evening.  I told her I would check with you to see what med adjustments she needs to make.  She said Xifaxan is too expensive for her.  Please advise.  Thanks [employer]

## 2017-05-20 ENCOUNTER — Emergency Department
Admission: EM | Admit: 2017-05-20 | Discharge: 2017-05-20 | Disposition: A | Discharge status: Home / Self Care | Payer: Medicare HMO | Attending: Emergency Medicine | Admitting: Emergency Medicine

## 2017-05-20 ENCOUNTER — Encounter: Payer: Self-pay | Admitting: Family Medicine

## 2017-05-20 ENCOUNTER — Emergency Department: Payer: Medicare HMO

## 2017-05-20 ENCOUNTER — Ambulatory Visit: Payer: Medicare HMO | Admitting: Family Medicine

## 2017-05-20 ENCOUNTER — Encounter: Payer: Self-pay | Admitting: Emergency Medicine

## 2017-05-20 VITALS — BP 106/62 | HR 95 | Temp 97.7°F | Resp 16 | Wt 279.8 lb

## 2017-05-20 DIAGNOSIS — S8011XA Contusion of right lower leg, initial encounter: Secondary | ICD-10-CM | POA: Diagnosis not present

## 2017-05-20 DIAGNOSIS — Y999 Unspecified external cause status: Secondary | ICD-10-CM | POA: Diagnosis not present

## 2017-05-20 DIAGNOSIS — G8929 Other chronic pain: Secondary | ICD-10-CM

## 2017-05-20 DIAGNOSIS — Y939 Activity, unspecified: Secondary | ICD-10-CM | POA: Insufficient documentation

## 2017-05-20 DIAGNOSIS — J449 Chronic obstructive pulmonary disease, unspecified: Secondary | ICD-10-CM | POA: Insufficient documentation

## 2017-05-20 DIAGNOSIS — M7989 Other specified soft tissue disorders: Secondary | ICD-10-CM

## 2017-05-20 DIAGNOSIS — F419 Anxiety disorder, unspecified: Secondary | ICD-10-CM

## 2017-05-20 DIAGNOSIS — Z87891 Personal history of nicotine dependence: Secondary | ICD-10-CM | POA: Diagnosis not present

## 2017-05-20 DIAGNOSIS — I110 Hypertensive heart disease with heart failure: Secondary | ICD-10-CM | POA: Diagnosis not present

## 2017-05-20 DIAGNOSIS — M79661 Pain in right lower leg: Secondary | ICD-10-CM | POA: Diagnosis not present

## 2017-05-20 DIAGNOSIS — M79604 Pain in right leg: Secondary | ICD-10-CM

## 2017-05-20 DIAGNOSIS — W19XXXA Unspecified fall, initial encounter: Secondary | ICD-10-CM | POA: Insufficient documentation

## 2017-05-20 DIAGNOSIS — Y929 Unspecified place or not applicable: Secondary | ICD-10-CM | POA: Diagnosis not present

## 2017-05-20 DIAGNOSIS — R600 Localized edema: Secondary | ICD-10-CM | POA: Diagnosis not present

## 2017-05-20 DIAGNOSIS — Z79899 Other long term (current) drug therapy: Secondary | ICD-10-CM | POA: Insufficient documentation

## 2017-05-20 DIAGNOSIS — M545 Low back pain: Secondary | ICD-10-CM

## 2017-05-20 DIAGNOSIS — Z872 Personal history of diseases of the skin and subcutaneous tissue: Secondary | ICD-10-CM

## 2017-05-20 DIAGNOSIS — T148XXA Other injury of unspecified body region, initial encounter: Secondary | ICD-10-CM

## 2017-05-20 DIAGNOSIS — Z7982 Long term (current) use of aspirin: Secondary | ICD-10-CM | POA: Diagnosis not present

## 2017-05-20 DIAGNOSIS — I5032 Chronic diastolic (congestive) heart failure: Secondary | ICD-10-CM | POA: Insufficient documentation

## 2017-05-20 MED ORDER — HYDROXYZINE HCL 25 MG PO TABS: 25.0000 mg | ORAL_TABLET | Freq: Three times a day (TID) | ORAL | 0 refills | Status: AC | PRN

## 2017-05-20 MED ORDER — ALPRAZOLAM 2 MG PO TABS: 2.0000 mg | ORAL_TABLET | Freq: Two times a day (BID) | ORAL | 0 refills | Status: AC | PRN

## 2017-05-20 MED ORDER — OXYCODONE HCL 5 MG PO TABS: 5.0000 mg | ORAL_TABLET | Freq: Three times a day (TID) | ORAL | 0 refills | Status: AC | PRN

## 2017-05-20 NOTE — Progress Notes (Signed)
Name: Leah Shah   MRN: [id number]    DOB: 06-07-1956   Date:05/20/2017       Progress Note  Subjective  Chief Complaint  Chief Complaint  Patient presents with  . Follow-up    1 month   . Fall    Pt fell 2 weeks ago and hit the bath tub on her bad knee. Tried to use the restroom and fell down and hit the tub, Pt right leg is swollen, red and bruised and hot to the touch   . Medication Refill    xanax, pain meds, and hydroxyine     Back Pain  This is a chronic problem. The problem occurs constantly. The pain is present in the sacro-iliac. The pain is at a severity of 5/10. The pain is moderate. The symptoms are aggravated by bending.  Anxiety  Presents for follow-up visit. Symptoms include excessive worry, insomnia and nervous/anxious behavior. Patient reports no panic. Primary symptoms comment: worsening anxiety becasue of office visit for liver specialist.. The severity of symptoms is moderate and causing significant distress.     Pt. Presents with acutely swollen and painful right lower leg, started after she sustained a fall at home 2 weeks ago, endorses pain redness warmth and swelling to her right leg. She has baseline swelling of both legs because of cirrhosis but the swelling in right leg is worse along with warmth and pain. She denies any fevers or chills.  She is able to walk with assistance   Past Medical History:  Diagnosis Date  . Anxiety   . Arthritis    R knee, L knee - OA  . Chronic diastolic CHF (congestive heart failure) ([place])    a. 06/2016 Echo: EF 60-65%, no rwma, mod MR, mildly dil LA, nl RV fxn, PASP 78mmHg.  [name] Cirrhosis ([place])   . Dysrhythmia    "mild arrythmia after taking Redux diet med years ago"no cardio fi  . Headache(784.0)   . History of stress test    a. 06/2016 MV: EF 67%, no ischemia/infarct.  . Hyperlipidemia   . Hypertension    echoJefm Bryant, wnl 2009? , had taken Redux for diet management  but now  off the market. pt, told that echo  wnl.    . Moderate mitral regurgitation    a. 06/2016 Echo: EF 60-65%, mod MR.  . Morbid obesity ([place])   . Osteoarthritis   . Recurrent upper respiratory infection (URI)    treated /w OTC med.     Past Surgical History:  Procedure Laterality Date  . ABDOMINAL HYSTERECTOMY     partial-uterus removed  . CHOLECYSTECTOMY     2011  . COCCYX REMOVAL     [facility]  . COLONOSCOPY WITH PROPOFOL N/A 03/11/2017   Procedure: COLONOSCOPY WITH PROPOFOL;  Surgeon: Lin Landsman, MD;  Location: [facility] ENDOSCOPY;  Service: Gastroenterology;  Laterality: N/A;  . ESOPHAGOGASTRODUODENOSCOPY (EGD) WITH PROPOFOL N/A 03/11/2017   Procedure: ESOPHAGOGASTRODUODENOSCOPY (EGD) WITH PROPOFOL;  Surgeon: Lin Landsman, MD;  Location: [facility] ENDOSCOPY;  Service: Gastroenterology;  Laterality: N/A;  . EYE SURGERY     states both lens replaced. Not sure if cataract surgery or not.  [name] HEEL SPUR SURGERY    . I&D EXTREMITY  08/27/2011   Procedure: IRRIGATION AND DEBRIDEMENT EXTREMITY;  Surgeon: [name], MD;  Location: [place];  Service: Orthopedics;  Laterality: Right;  . IR TRANSCATHETER BX  05/07/2017  . JOINT REPLACEMENT Right 2012   Right TKR  .  KNEE ARTHROPLASTY  07/28/2011   Procedure: COMPUTER ASSISTED TOTAL KNEE ARTHROPLASTY;  Surgeon: [name], MD;  Location: [place];  Service: Orthopedics;  Laterality: Right;  right total knee arthroplasty, computer assisted  . KNEE ARTHROSCOPY     right knee  . TMJ ARTHROPLASTY     [facility]- 1997    Family History  Problem Relation Age of Onset  . Heart disease Mother   . Hypertension Mother   . Hypertension Father   . Heart disease Sister 69       stent   . Heart attack Sister   . Hyperlipidemia Sister   . Hypertension Sister   . Heart attack Maternal Uncle 64  . Heart disease Maternal Uncle        CABG    Social History   Socioeconomic History  . Marital status: Married    Spouse name: Not on file  . Number of children: Not on file    . Years of education: Not on file  . Highest education level: Not on file  Social Needs  . Financial resource strain: Not on file  . Food insecurity - worry: Not on file  . Food insecurity - inability: Not on file  . Transportation needs - medical: Not on file  . Transportation needs - non-medical: Not on file  Occupational History  . Not on file  Tobacco Use  . Smoking status: Former Smoker    Packs/day: 0.25    Years: 50.00    Pack years: 12.50    Types: Cigarettes    Last attempt to quit: 03/20/2017    Years since quitting: 0.1  . Smokeless tobacco: Never Used  Substance and Sexual Activity  . Alcohol use: No  . Drug use: No  . Sexual activity: Not Currently  Other Topics Concern  . Not on file  Social History Narrative   Lives in [place] with husband.  Does not routinely exercise.     Current Outpatient Medications:  .  albuterol (PROVENTIL HFA;VENTOLIN HFA) 108 (90 Base) MCG/ACT inhaler, Inhale 2 puffs into the lungs every 6 (six) hours as needed for wheezing or shortness of breath., Disp: 1 Inhaler, Rfl: 0 .  alprazolam (XANAX) 2 MG tablet, Take 1 tablet (2 mg total) by mouth 2 (two) times daily as needed for anxiety., Disp: 60 tablet, Rfl: 0 .  amoxicillin (AMOXIL) 500 MG capsule, Take 1,000 mg 2 (two) times daily by mouth., Disp: , Rfl: 0 .  aspirin 81 MG EC tablet, Take 1 tablet (81 mg total) by mouth daily., Disp: 30 tablet, Rfl: 0 .  atenolol (TENORMIN) 100 MG tablet, TAKE 1 TABLET (100 MG TOTAL) BY MOUTH DAILY., Disp: 90 tablet, Rfl: 0 .  cephALEXin (KEFLEX) 500 MG capsule, Take 1 capsule (500 mg total) by mouth every 12 (twelve) hours., Disp: 8 capsule, Rfl: 0 .  citalopram (CELEXA) 10 MG tablet, Take 1 tablet (10 mg total) by mouth daily., Disp: 90 tablet, Rfl: 0 .  cyclobenzaprine (FLEXERIL) 5 MG tablet, TAKE ONE AND ONE-HALF TABLETS BY MOUTH EVERY NIGHT AT BEDTIME, Disp: 90 tablet, Rfl: 0 .  fluconazole (DIFLUCAN) 200 MG tablet, Take 200 mg daily by  mouth., Disp: , Rfl: 0 .  furosemide (LASIX) 40 MG tablet, TAKE 2 TABLETS (80 MG TOTAL) BY MOUTH DAILY. IN THE MORNING, Disp: , Rfl: 0 .  hydrOXYzine (ATARAX/VISTARIL) 25 MG tablet, Take 1 tablet (25 mg total) by mouth every 8 (eight) hours as needed., Disp: 270 tablet, Rfl: 0 .  KLOR-CON M20 20 MEQ tablet, Take 60 mEq by mouth daily., Disp: , Rfl: 1 .  lactulose (CHRONULAC) 10 GM/15ML solution, Take 15 mLs (10 g total) by mouth 2 (two) times daily., Disp: 240 mL, Rfl: 0 .  magnesium oxide (MAG-OX) 400 (241.3 Mg) MG tablet, Take 1 tablet 2 (two) times daily by mouth., Disp: , Rfl: 0 .  Magnesium Oxide 400 (240 Mg) MG TABS, TAKE 1 TABLET BY MOUTH TWICE A DAY, Disp: 60 tablet, Rfl: 0 .  omeprazole (PRILOSEC) 40 MG capsule, Take 1 capsule (40 mg total) by mouth 2 (two) times daily before a meal., Disp: 180 capsule, Rfl: 0 .  polyethylene glycol (MIRALAX / GLYCOLAX) packet, Take 17 g by mouth daily as needed for mild constipation., Disp: 14 each, Rfl: 0 .  rifaximin (XIFAXAN) 550 MG TABS tablet, Take 1 tablet (550 mg total) 2 (two) times daily by mouth., Disp: 60 tablet, Rfl: 2 .  spironolactone (ALDACTONE) 100 MG tablet, Take 1 tablet (100 mg total) by mouth daily., Disp: 90 tablet, Rfl: 0 .  traZODone (DESYREL) 150 MG tablet, Take 1 tablet by mouth daily as needed., Disp: , Rfl:  .  Vitamin D, Ergocalciferol, (DRISDOL) 50000 units CAPS capsule, Take 1 capsule (50,000 Units total) by mouth every 7 (seven) days., Disp: 16 capsule, Rfl: 0  Allergies  Allergen Reactions  . Bacitracin-Neomycin-Polymyxin Other (See Comments)  . Codeine Other (See Comments)    Itching "she can take some forms of it" Itching "she can take some forms of it"  . Gabapentin Nausea Only  . Neosporin  [Neomycin-Bacitracin Zn-Polymyx]   . Pregabalin Other (See Comments)    stomach issues stomach issues     Review of Systems  Musculoskeletal: Positive for back pain.  Psychiatric/Behavioral: The patient is nervous/anxious  and has insomnia.       Objective  Vitals:   05/20/17 1327  BP: 106/62  Pulse: 95  Resp: 16  Temp: 97.7 F (36.5 C)  TempSrc: Oral  SpO2: 95%  Weight: 279 lb 12.8 oz (126.9 kg)    Physical Exam  Constitutional: She is oriented to person, place, and time and well-developed, well-nourished, and in no distress.  Cardiovascular: Normal rate, regular rhythm and normal heart sounds.  No murmur heard. Pulmonary/Chest: Effort normal and breath sounds normal. She has no wheezes.  Musculoskeletal:       Lumbar back: She exhibits tenderness, pain and spasm.       Back:  Right leg is tender, erythematous, swollen and has hematoma around the knee, no open drainage,   Neurological: She is alert and oriented to person, place, and time.  Psychiatric: Mood, memory, affect and judgment normal.  Nursing note and vitals reviewed.     Assessment & Plan  1. Pain and swelling of right lower leg Differential includes DVT, plus possible cellulitis, unlikely to be fracture or other acute bony injury, patient has baseline lower extremity swelling, we'll refer to ER for further management  2. Anxiety Table, responsive to alprazolam taken twice a day when necessary, advised of my impending departure from practice and the need to see a psychiatrist for further management. Patient verbalized agreement - alprazolam Duanne Moron) 2 MG tablet; Take 1 tablet (2 mg total) 2 (two) times daily as needed by mouth for anxiety.  Dispense: 60 tablet; Refill: 0  3. H/O skin pruritus  - hydrOXYzine (ATARAX/VISTARIL) 25 MG tablet; Take 1 tablet (25 mg total) every 8 (eight) hours as needed by mouth.  Dispense: 270  tablet; Refill: 0  4. Chronic midline low back pain without sciatica Pain is responsive to opioid treatment, but advised of my impending departure from practice and the need to see a pain clinic for further management. - oxyCODONE (OXY IR/ROXICODONE) 5 MG immediate release tablet; Take 1 tablet (5 mg  total) every 8 (eight) hours as needed by mouth for severe pain.  Dispense: 90 tablet; Refill: 0   Tea Collums [facility] 05/20/2017 1:51 PM

## 2017-05-20 NOTE — ED Notes (Signed)
Strong pedal pulse to right foot via doppler.

## 2017-05-20 NOTE — ED Provider Notes (Signed)
[facility] Emergency Department Provider Note  ____________________________________________   First MD Initiated Contact with Patient 05/20/17 1619     (approximate)  I have reviewed the triage vital signs and the nursing notes.   HISTORY  Chief Complaint Leg Pain   HPI Leah Shah is a 61 y.o. female who was sent to the emergency department by her primary care physician for possible deep vein thrombosis to her right leg.  2 weeks ago the patient fell landed on her right knee and suffered immediate severe aching pain in her right leg.  The pain has been slowly progressive.  She has been able to ambulate without difficulty.  She has noted increasing ecchymosis and today her primary care physician was concerned that she may have a blood clot.  She has no chest pain or shortness of breath.  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  She declines pain medication at this time.  She is able to ambulate although ambulation makes her pain somewhat worse.  The pain is improved with rest.  Past Medical History:  Diagnosis Date  . Anxiety   . Arthritis    R knee, L knee - OA  . Chronic diastolic CHF (congestive heart failure) ([place])    a. 06/2016 Echo: EF 60-65%, no rwma, mod MR, mildly dil LA, nl RV fxn, PASP 58mmHg.  [name] Cirrhosis ([place])   . Dysrhythmia    "mild arrythmia after taking Redux diet med years ago"no cardio fi  . Headache(784.0)   . History of stress test    a. 06/2016 MV: EF 67%, no ischemia/infarct.  . Hyperlipidemia   . Hypertension    echoJefm Bryant, wnl 2009? , had taken Redux for diet management  but now  off the market. pt, told that echo wnl.    . Moderate mitral regurgitation    a. 06/2016 Echo: EF 60-65%, mod MR.  . Morbid obesity ([place])   . Osteoarthritis   . Recurrent upper respiratory infection (URI)    treated /w OTC med.     Patient Active Problem List   Diagnosis Date Noted  .  UTI (urinary tract infection) 04/03/2017  . PUD (peptic ulcer disease) 04/02/2017  . 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 Portal hypertensive gastropathy ([place]) 04/02/2017  . Abnormal ECG 02/16/2017  . Edema extremities 02/16/2017  . Family history of diabetes mellitus 02/16/2017  . Melanocytic nevus of skin 02/16/2017  . Spasm 02/16/2017  . Screening for thyroid disorder 02/16/2017  . Flu vaccine need 02/16/2017  . Hyperbilirubinemia 01/11/2017  . Cirrhosis ([place]) 01/11/2017  . Alkaline phosphatase elevation 01/11/2017  . COPD exacerbation ([place]) 09/08/2016  . Centrilobular emphysema ([place])   . Chronic diastolic CHF (congestive heart failure) ([place])   . Extreme obesity   . Hypertensive heart disease with heart failure ([place])   . Atrial fibrillation ([place]) 08/27/2016  . Congestive heart failure (CHF) ([place]) 08/27/2016  . Respiratory crackles at right lung base 04/29/2016  . Acute confusion following injury ([place]) 02/24/2016  . Hyperglycemia 12/12/2015  . Bilateral swelling of feet and ankles 11/13/2015  . 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 Dyslipidemia 06/16/2015  . MI (mitral incompetence) 06/16/2015  . DDD (degenerative disc disease), lumbar 06/07/2015  . Primary osteoarthritis of left knee 06/07/2015  . Status post right knee replacement 06/07/2015  . Acute right hip pain 05/16/2015  . Leg pain 04/22/2015  .  Abnormal LFTs 04/15/2015  . BP (high blood pressure) 04/15/2015  . Reduced mobility 04/15/2015  . Chronic urticaria 04/15/2015  . Hyperlipidemia 03/14/2015  . Chronic pain of left knee 01/31/2015  . Gonalgia 01/10/2015  . Clinical depression 01/10/2015  . Benign hypertension 01/10/2015  . Cannot sleep 01/10/2015  . Current tobacco use 01/10/2015  . LBP (low back pain) 12/13/2014  . Continuous opioid dependence ([place]) 12/13/2014  . Anxiety 12/13/2014  . Cervical spine pain 12/13/2014  . Pain and swelling  of left knee 12/13/2014    Past Surgical History:  Procedure Laterality Date  . ABDOMINAL HYSTERECTOMY     partial-uterus removed  . CHOLECYSTECTOMY     2011  . COCCYX REMOVAL     [facility]  . COLONOSCOPY WITH PROPOFOL N/A 03/11/2017   Procedure: COLONOSCOPY WITH PROPOFOL;  Surgeon: Lin Landsman, MD;  Location: [facility] ENDOSCOPY;  Service: Gastroenterology;  Laterality: N/A;  . ESOPHAGOGASTRODUODENOSCOPY (EGD) WITH PROPOFOL N/A 03/11/2017   Procedure: ESOPHAGOGASTRODUODENOSCOPY (EGD) WITH PROPOFOL;  Surgeon: Lin Landsman, MD;  Location: [facility] ENDOSCOPY;  Service: Gastroenterology;  Laterality: N/A;  . EYE SURGERY     states both lens replaced. Not sure if cataract surgery or not.  [name] HEEL SPUR SURGERY    . I&D EXTREMITY  08/27/2011   Procedure: IRRIGATION AND DEBRIDEMENT EXTREMITY;  Surgeon: [name], MD;  Location: [place];  Service: Orthopedics;  Laterality: Right;  . IR TRANSCATHETER BX  05/07/2017  . JOINT REPLACEMENT Right 2012   Right TKR  . KNEE ARTHROPLASTY  07/28/2011   Procedure: COMPUTER ASSISTED TOTAL KNEE ARTHROPLASTY;  Surgeon: [name], MD;  Location: [place];  Service: Orthopedics;  Laterality: Right;  right total knee arthroplasty, computer assisted  . KNEE ARTHROSCOPY     right knee  . TMJ ARTHROPLASTY     [place]    Prior to Admission medications   Medication Sig Start Date End Date Taking? Authorizing Provider  albuterol (PROVENTIL HFA;VENTOLIN HFA) 108 (90 Base) MCG/ACT inhaler Inhale 2 puffs into the lungs every 6 (six) hours as needed for wheezing or shortness of breath. 08/29/16   Loletha Grayer, MD  alprazolam Duanne Moron) 2 MG tablet Take 1 tablet (2 mg total) 2 (two) times daily as needed by mouth for anxiety. 05/20/17 06/19/17  Roselee Nova, MD  amoxicillin (AMOXIL) 500 MG capsule Take 1,000 mg 2 (two) times daily by mouth. 03/12/17   [provider]  aspirin 81 MG EC tablet Take 1 tablet (81 mg total) by mouth daily.  08/30/16   Loletha Grayer, MD  atenolol (TENORMIN) 100 MG tablet TAKE 1 TABLET (100 MG TOTAL) BY MOUTH DAILY. 03/29/17   Roselee Nova, MD  cephALEXin (KEFLEX) 500 MG capsule Take 1 capsule (500 mg total) by mouth every 12 (twelve) hours. 04/07/17   Fritzi Mandes, MD  citalopram (CELEXA) 10 MG tablet Take 1 tablet (10 mg total) by mouth daily. 02/04/17   Roselee Nova, MD  cyclobenzaprine (FLEXERIL) 5 MG tablet TAKE ONE AND ONE-HALF TABLETS BY MOUTH EVERY NIGHT AT BEDTIME 03/04/17   Rochel Brome A, MD  fluconazole (DIFLUCAN) 200 MG tablet Take 200 mg daily by mouth. 03/18/17   [provider]  furosemide (LASIX) 40 MG tablet TAKE 2 TABLETS (80 MG TOTAL) BY MOUTH DAILY. IN THE MORNING 04/17/17   [provider]  hydrOXYzine (ATARAX/VISTARIL) 25 MG tablet Take 1 tablet (25 mg total) every 8 (eight) hours as needed by mouth.  05/20/17   Roselee Nova, MD  KLOR-CON M20 20 MEQ tablet Take 60 mEq by mouth daily. 03/25/17   [provider]  lactulose (CHRONULAC) 10 GM/15ML solution Take 15 mLs (10 g total) by mouth 2 (two) times daily. 04/07/17   Fritzi Mandes, MD  magnesium oxide (MAG-OX) 400 (241.3 Mg) MG tablet Take 1 tablet 2 (two) times daily by mouth. 04/29/17   [provider]  Magnesium Oxide 400 (240 Mg) MG TABS TAKE 1 TABLET BY MOUTH TWICE A DAY 03/30/17   Vanga, Tally Due, MD  omeprazole (PRILOSEC) 40 MG capsule Take 1 capsule (40 mg total) by mouth 2 (two) times daily before a meal. 03/11/17 06/09/17  Vanga, Tally Due, MD  oxyCODONE (OXY IR/ROXICODONE) 5 MG immediate release tablet Take 1 tablet (5 mg total) every 8 (eight) hours as needed by mouth for severe pain. 05/20/17   Roselee Nova, MD  polyethylene glycol [facility] / Floria Raveling) packet Take 17 g by mouth daily as needed for mild constipation. 04/07/17   Fritzi Mandes, MD  rifaximin (XIFAXAN) 550 MG TABS tablet Take 1 tablet (550 mg total) 2 (two) times daily by mouth. 05/13/17 06/12/17  Lin Landsman, MD  spironolactone (ALDACTONE) 100 MG tablet Take 1 tablet (100 mg total) by mouth daily. 04/07/17 07/06/17  Fritzi Mandes, MD  traZODone (DESYREL) 150 MG tablet Take 1 tablet by mouth daily as needed. 10/13/13   [provider]  Vitamin D, Ergocalciferol, (DRISDOL) 50000 units CAPS capsule Take 1 capsule (50,000 Units total) by mouth every 7 (seven) days. 03/03/17 06/17/17  Lin Landsman, MD    Allergies Bacitracin-neomycin-polymyxin; Codeine; Gabapentin; Neosporin  [neomycin-bacitracin zn-polymyx]; and Pregabalin  Family History  Problem Relation Age of Onset  . Heart disease Mother   . Hypertension Mother   . Hypertension Father   . Heart disease Sister 74       stent   . Heart attack Sister   . Hyperlipidemia Sister   . Hypertension Sister   . Heart attack Maternal Uncle 64  . Heart disease Maternal Uncle        CABG    Social History Social History   Tobacco Use  . Smoking status: Former Smoker    Packs/day: 0.25    Years: 50.00    Pack years: 12.50    Types: Cigarettes    Last attempt to quit: 03/20/2017    Years since quitting: 0.1  . Smokeless tobacco: Never Used  Substance Use Topics  . Alcohol use: No  . Drug use: No    Review of Systems Constitutional: No fever/chills ENT: No sore throat. Cardiovascular: Denies chest pain. Respiratory: Denies shortness of breath. Gastrointestinal: No abdominal pain.  No nausea, no vomiting.  No diarrhea.  No constipation. Musculoskeletal: Negative for back pain. Neurological: Negative for headaches   ____________________________________________   PHYSICAL EXAM:  VITAL SIGNS: ED Triage Vitals  Enc Vitals Group     BP 05/20/17 1510 (!) 127/52     Pulse Rate 05/20/17 1510 83     Resp 05/20/17 1510 16     Temp 05/20/17 1510 97.7 F (36.5 C)     Temp Source 05/20/17 1510 Oral     SpO2 05/20/17 1510 99 %     Weight 05/20/17 1511 278 lb (126.1 kg)     Height 05/20/17 1511 5\' 8"  (1.727 m)     Head  Circumference --      Peak Flow --  Pain Score 05/20/17 1510 5     Pain Loc --      Pain Edu? --      Excl. in [place]? --     Constitutional: Alert and oriented x4 joking laughing well-appearing nontoxic no diaphoresis speaks full clear sentences Head: Atraumatic. Nose: No congestion/rhinnorhea. Mouth/Throat: No trismus Neck: No stridor.   Cardiovascular: Regular rate and rhythm Respiratory: Normal respiratory effort.  No retractions. MSK: Bilateral lower extremities significantly edematous although equal in size.  Right lower extremity with old appearing bruising Neurologic:  Normal speech and language. No gross focal neurologic deficits are appreciated.  Skin:  Skin is warm, dry and intact.     ____________________________________________  LABS (all labs ordered are listed, but only abnormal results are displayed)  Labs Reviewed - No data to display   __________________________________________  EKG   ____________________________________________  RADIOLOGY  Ultrasound reviewed by me shows no evidence of deep vein thrombosis ____________________________________________   DIFFERENTIAL includes but not limited to  Deep vein thrombosis, fracture, cellulitis   PROCEDURES  Procedure(s) performed: no  Procedures  Critical Care performed: no  Observation: no ____________________________________________   INITIAL IMPRESSION / ASSESSMENT AND PLAN / ED COURSE  Pertinent labs & imaging results that were available during my care of the patient were reviewed by me and considered in my medical decision making (see chart for details).  Patient is very well-appearing and neurologically intact.  Ultrasound is negative for clot.  She is ambulating without difficulty and I doubt fracture.  At this point she is medically stable for outpatient management with referral back to her primary care physician.  The patient verbalizes understanding and agreement the plan.       ____________________________________________   FINAL CLINICAL IMPRESSION(S) / ED DIAGNOSES  Final diagnoses:  Right leg pain  Bruising      NEW MEDICATIONS STARTED DURING THIS VISIT:  This SmartLink is deprecated. Use AVSMEDLIST instead to display the medication list for a patient.   Note:  This document was prepared using Dragon voice recognition software and may include unintentional dictation errors.      Darel Hong, MD 05/20/17 [phone]

## 2017-05-20 NOTE — ED Notes (Signed)
Patient in ultrasound. Will be placed in room after exam is finished.

## 2017-05-20 NOTE — Discharge Instructions (Signed)
Fortunately today your ultrasound was negative for blood clot.  Please follow-up with your primary care physician as scheduled and return to the emergency department for any concerns.  It was a pleasure to take care of you today, and thank you for coming to our emergency department.  If you have any questions or concerns before leaving please ask the nurse to grab me and I'm more than happy to go through your aftercare instructions again.  If you were prescribed any opioid pain medication today such as Norco, Vicodin, Percocet, morphine, hydrocodone, or oxycodone please make sure you do not drive when you are taking this medication as it can alter your ability to drive safely.  If you have any concerns once you are home that you are not improving or are in fact getting worse before you can make it to your follow-up appointment, please do not hesitate to call 911 and come back for further evaluation.  Darel Hong, MD    US Venous Img Lower Unilateral Right  Result Date: 05/20/2017 CLINICAL DATA:  Right lower extremity pain and edema since falling 2 weeks ago. Evaluate for DVT. EXAM: RIGHT LOWER EXTREMITY VENOUS DOPPLER ULTRASOUND TECHNIQUE: Gray-scale sonography with graded compression, as well as color Doppler and duplex ultrasound were performed to evaluate the lower extremity deep venous systems from the level of the common femoral vein and including the common femoral, femoral, profunda femoral, popliteal and calf veins including the posterior tibial, peroneal and gastrocnemius veins when visible. The superficial great saphenous vein was also interrogated. Spectral Doppler was utilized to evaluate flow at rest and with distal augmentation maneuvers in the common femoral, femoral and popliteal veins. COMPARISON:  None. FINDINGS: Contralateral Common Femoral Vein: Respiratory phasicity is normal and symmetric with the symptomatic side. No evidence of thrombus. Normal compressibility. Common  Femoral Vein: No evidence of thrombus. Normal compressibility, respiratory phasicity and response to augmentation. Saphenofemoral Junction: No evidence of thrombus. Normal compressibility and flow on color Doppler imaging. Profunda Femoral Vein: No evidence of thrombus. Normal compressibility and flow on color Doppler imaging. Femoral Vein: No evidence of thrombus. Normal compressibility, respiratory phasicity and response to augmentation. Popliteal Vein: No evidence of thrombus. Normal compressibility, respiratory phasicity and response to augmentation. Calf Veins: Not well visualized due to subcutaneous edema. Other Findings:  None. IMPRESSION: No evidence of DVT within right lower extremity. Electronically Signed   By: Sandi Mariscal M.D.   On: 05/20/2017 16:14

## 2017-05-20 NOTE — ED Triage Notes (Signed)
Pt in via POV with complaints of right leg pain x approximately 2 weeks.  Pt fell 2 weeks ago, landing on that leg, pt seen per PCP today and advised to be evaluated here for possible DVT.  Bruising, redness, warmth noted to right leg with increased swelling.  Pt ambulatory to triage room with cane.  Vitals WDL.  NAD noted at this time.

## 2017-05-21 DIAGNOSIS — R188 Other ascites: Secondary | ICD-10-CM | POA: Diagnosis not present

## 2017-05-21 DIAGNOSIS — I110 Hypertensive heart disease with heart failure: Secondary | ICD-10-CM | POA: Diagnosis not present

## 2017-05-21 DIAGNOSIS — M170 Bilateral primary osteoarthritis of knee: Secondary | ICD-10-CM | POA: Diagnosis not present

## 2017-05-21 DIAGNOSIS — K7581 Nonalcoholic steatohepatitis (NASH): Secondary | ICD-10-CM | POA: Diagnosis not present

## 2017-05-21 DIAGNOSIS — I5033 Acute on chronic diastolic (congestive) heart failure: Secondary | ICD-10-CM | POA: Diagnosis not present

## 2017-05-21 DIAGNOSIS — K3189 Other diseases of stomach and duodenum: Secondary | ICD-10-CM | POA: Diagnosis not present

## 2017-05-24 ENCOUNTER — Telehealth: Payer: Self-pay

## 2017-05-24 DIAGNOSIS — K7291 Hepatic failure, unspecified with coma: Secondary | ICD-10-CM | POA: Diagnosis not present

## 2017-05-24 DIAGNOSIS — R790 Abnormal level of blood mineral: Secondary | ICD-10-CM | POA: Diagnosis not present

## 2017-05-24 DIAGNOSIS — Z6841 Body Mass Index (BMI) 40.0 and over, adult: Secondary | ICD-10-CM | POA: Diagnosis not present

## 2017-05-24 DIAGNOSIS — K7460 Unspecified cirrhosis of liver: Secondary | ICD-10-CM | POA: Diagnosis not present

## 2017-05-24 DIAGNOSIS — K766 Portal hypertension: Secondary | ICD-10-CM | POA: Diagnosis not present

## 2017-05-24 DIAGNOSIS — I5032 Chronic diastolic (congestive) heart failure: Secondary | ICD-10-CM | POA: Diagnosis not present

## 2017-05-24 NOTE — Telephone Encounter (Signed)
Pt has been notified rx for Xifaxan has been approved.  She has been asked to picck up rx from pharmacy and begin right away.   She stated she had an ER visit this weekend for DVT, leg swelling is increasing-ER did not do much for the swelling. The swelling is really worrying her.    She has an appt with the liver specialist this afternoon at 1pm.  I am forwarding to message to Dr. Manuella Ghazi as well.

## 2017-05-25 ENCOUNTER — Telehealth: Payer: Self-pay

## 2017-05-25 NOTE — Telephone Encounter (Signed)
Received fax notification "Liver Disease Referral Received" from [facility] and they will contact pt with appt information and notify our office with date and time.  Thanks [employer]

## 2017-05-26 DIAGNOSIS — I5033 Acute on chronic diastolic (congestive) heart failure: Secondary | ICD-10-CM | POA: Diagnosis not present

## 2017-05-26 DIAGNOSIS — M170 Bilateral primary osteoarthritis of knee: Secondary | ICD-10-CM | POA: Diagnosis not present

## 2017-05-26 DIAGNOSIS — I110 Hypertensive heart disease with heart failure: Secondary | ICD-10-CM | POA: Diagnosis not present

## 2017-05-26 DIAGNOSIS — K3189 Other diseases of stomach and duodenum: Secondary | ICD-10-CM | POA: Diagnosis not present

## 2017-05-26 DIAGNOSIS — R188 Other ascites: Secondary | ICD-10-CM | POA: Diagnosis not present

## 2017-05-26 DIAGNOSIS — K7581 Nonalcoholic steatohepatitis (NASH): Secondary | ICD-10-CM | POA: Diagnosis not present

## 2017-05-31 NOTE — Telephone Encounter (Signed)
Copied from [place] [id number]. Topic: Inquiry >> May 31, 2017 12:11 PM Leah Shah wrote: Pt needs to know what she needs to do to get: Oxygen, Scooter, and Ramp  Pt want's the written Rx given to her husband when he comes for his appt. Tomorrow at 10:40

## 2017-06-02 ENCOUNTER — Other Ambulatory Visit: Payer: Self-pay | Admitting: Family Medicine

## 2017-06-03 DIAGNOSIS — K7581 Nonalcoholic steatohepatitis (NASH): Secondary | ICD-10-CM | POA: Diagnosis not present

## 2017-06-03 DIAGNOSIS — K3189 Other diseases of stomach and duodenum: Secondary | ICD-10-CM | POA: Diagnosis not present

## 2017-06-03 DIAGNOSIS — I110 Hypertensive heart disease with heart failure: Secondary | ICD-10-CM | POA: Diagnosis not present

## 2017-06-03 DIAGNOSIS — I5033 Acute on chronic diastolic (congestive) heart failure: Secondary | ICD-10-CM | POA: Diagnosis not present

## 2017-06-03 DIAGNOSIS — R188 Other ascites: Secondary | ICD-10-CM | POA: Diagnosis not present

## 2017-06-03 DIAGNOSIS — M170 Bilateral primary osteoarthritis of knee: Secondary | ICD-10-CM | POA: Diagnosis not present

## 2017-06-04 ENCOUNTER — Other Ambulatory Visit: Payer: Self-pay

## 2017-06-04 ENCOUNTER — Inpatient Hospital Stay (HOSPITAL_COMMUNITY): Payer: Medicare HMO

## 2017-06-04 ENCOUNTER — Inpatient Hospital Stay (HOSPITAL_COMMUNITY)
Admission: EM | Admit: 2017-06-04 | Discharge: 2017-07-06 | DRG: 871 | Disposition: E | Payer: Medicare HMO | Attending: Emergency Medicine | Admitting: Emergency Medicine

## 2017-06-04 ENCOUNTER — Emergency Department (HOSPITAL_COMMUNITY): Payer: Medicare HMO

## 2017-06-04 ENCOUNTER — Encounter (HOSPITAL_COMMUNITY): Payer: Self-pay

## 2017-06-04 DIAGNOSIS — K7291 Hepatic failure, unspecified with coma: Secondary | ICD-10-CM | POA: Diagnosis not present

## 2017-06-04 DIAGNOSIS — L03119 Cellulitis of unspecified part of limb: Secondary | ICD-10-CM | POA: Diagnosis present

## 2017-06-04 DIAGNOSIS — I509 Heart failure, unspecified: Secondary | ICD-10-CM | POA: Diagnosis not present

## 2017-06-04 DIAGNOSIS — K7460 Unspecified cirrhosis of liver: Secondary | ICD-10-CM | POA: Diagnosis not present

## 2017-06-04 DIAGNOSIS — R570 Cardiogenic shock: Secondary | ICD-10-CM | POA: Diagnosis present

## 2017-06-04 DIAGNOSIS — R571 Hypovolemic shock: Secondary | ICD-10-CM | POA: Diagnosis present

## 2017-06-04 DIAGNOSIS — G8929 Other chronic pain: Secondary | ICD-10-CM | POA: Diagnosis present

## 2017-06-04 DIAGNOSIS — N170 Acute kidney failure with tubular necrosis: Secondary | ICD-10-CM | POA: Diagnosis not present

## 2017-06-04 DIAGNOSIS — Y92009 Unspecified place in unspecified non-institutional (private) residence as the place of occurrence of the external cause: Secondary | ICD-10-CM | POA: Diagnosis not present

## 2017-06-04 DIAGNOSIS — Z66 Do not resuscitate: Secondary | ICD-10-CM | POA: Diagnosis present

## 2017-06-04 DIAGNOSIS — R918 Other nonspecific abnormal finding of lung field: Secondary | ICD-10-CM | POA: Diagnosis not present

## 2017-06-04 DIAGNOSIS — K659 Peritonitis, unspecified: Secondary | ICD-10-CM | POA: Diagnosis not present

## 2017-06-04 DIAGNOSIS — J811 Chronic pulmonary edema: Secondary | ICD-10-CM

## 2017-06-04 DIAGNOSIS — E872 Acidosis: Secondary | ICD-10-CM | POA: Diagnosis not present

## 2017-06-04 DIAGNOSIS — Z87891 Personal history of nicotine dependence: Secondary | ICD-10-CM

## 2017-06-04 DIAGNOSIS — Z9049 Acquired absence of other specified parts of digestive tract: Secondary | ICD-10-CM

## 2017-06-04 DIAGNOSIS — Z452 Encounter for adjustment and management of vascular access device: Secondary | ICD-10-CM | POA: Diagnosis not present

## 2017-06-04 DIAGNOSIS — A419 Sepsis, unspecified organism: Principal | ICD-10-CM | POA: Diagnosis present

## 2017-06-04 DIAGNOSIS — E875 Hyperkalemia: Secondary | ICD-10-CM | POA: Diagnosis not present

## 2017-06-04 DIAGNOSIS — Z79899 Other long term (current) drug therapy: Secondary | ICD-10-CM

## 2017-06-04 DIAGNOSIS — E870 Hyperosmolality and hypernatremia: Secondary | ICD-10-CM | POA: Diagnosis present

## 2017-06-04 DIAGNOSIS — E162 Hypoglycemia, unspecified: Secondary | ICD-10-CM | POA: Diagnosis present

## 2017-06-04 DIAGNOSIS — K7290 Hepatic failure, unspecified without coma: Secondary | ICD-10-CM | POA: Diagnosis present

## 2017-06-04 DIAGNOSIS — R680 Hypothermia, not associated with low environmental temperature: Secondary | ICD-10-CM | POA: Diagnosis not present

## 2017-06-04 DIAGNOSIS — I481 Persistent atrial fibrillation: Secondary | ICD-10-CM | POA: Diagnosis not present

## 2017-06-04 DIAGNOSIS — Z8349 Family history of other endocrine, nutritional and metabolic diseases: Secondary | ICD-10-CM

## 2017-06-04 DIAGNOSIS — R601 Generalized edema: Secondary | ICD-10-CM | POA: Diagnosis not present

## 2017-06-04 DIAGNOSIS — R9431 Abnormal electrocardiogram [ECG] [EKG]: Secondary | ICD-10-CM | POA: Diagnosis not present

## 2017-06-04 DIAGNOSIS — S27309A Unspecified injury of lung, unspecified, initial encounter: Secondary | ICD-10-CM | POA: Diagnosis present

## 2017-06-04 DIAGNOSIS — Z781 Physical restraint status: Secondary | ICD-10-CM

## 2017-06-04 DIAGNOSIS — R161 Splenomegaly, not elsewhere classified: Secondary | ICD-10-CM | POA: Diagnosis present

## 2017-06-04 DIAGNOSIS — R748 Abnormal levels of other serum enzymes: Secondary | ICD-10-CM | POA: Diagnosis not present

## 2017-06-04 DIAGNOSIS — J9690 Respiratory failure, unspecified, unspecified whether with hypoxia or hypercapnia: Secondary | ICD-10-CM | POA: Diagnosis not present

## 2017-06-04 DIAGNOSIS — F419 Anxiety disorder, unspecified: Secondary | ICD-10-CM | POA: Diagnosis present

## 2017-06-04 DIAGNOSIS — I214 Non-ST elevation (NSTEMI) myocardial infarction: Secondary | ICD-10-CM | POA: Diagnosis not present

## 2017-06-04 DIAGNOSIS — K7469 Other cirrhosis of liver: Secondary | ICD-10-CM | POA: Diagnosis not present

## 2017-06-04 DIAGNOSIS — Z4682 Encounter for fitting and adjustment of non-vascular catheter: Secondary | ICD-10-CM | POA: Diagnosis not present

## 2017-06-04 DIAGNOSIS — J810 Acute pulmonary edema: Secondary | ICD-10-CM

## 2017-06-04 DIAGNOSIS — Z888 Allergy status to other drugs, medicaments and biological substances status: Secondary | ICD-10-CM

## 2017-06-04 DIAGNOSIS — E878 Other disorders of electrolyte and fluid balance, not elsewhere classified: Secondary | ICD-10-CM | POA: Diagnosis present

## 2017-06-04 DIAGNOSIS — Z8249 Family history of ischemic heart disease and other diseases of the circulatory system: Secondary | ICD-10-CM

## 2017-06-04 DIAGNOSIS — Z515 Encounter for palliative care: Secondary | ICD-10-CM | POA: Diagnosis not present

## 2017-06-04 DIAGNOSIS — J982 Interstitial emphysema: Secondary | ICD-10-CM | POA: Diagnosis present

## 2017-06-04 DIAGNOSIS — E871 Hypo-osmolality and hyponatremia: Secondary | ICD-10-CM | POA: Diagnosis not present

## 2017-06-04 DIAGNOSIS — B159 Hepatitis A without hepatic coma: Secondary | ICD-10-CM | POA: Diagnosis present

## 2017-06-04 DIAGNOSIS — J189 Pneumonia, unspecified organism: Secondary | ICD-10-CM | POA: Diagnosis present

## 2017-06-04 DIAGNOSIS — R1310 Dysphagia, unspecified: Secondary | ICD-10-CM | POA: Diagnosis present

## 2017-06-04 DIAGNOSIS — I459 Conduction disorder, unspecified: Secondary | ICD-10-CM | POA: Diagnosis present

## 2017-06-04 DIAGNOSIS — E71311 Medium chain acyl CoA dehydrogenase deficiency: Secondary | ICD-10-CM | POA: Diagnosis present

## 2017-06-04 DIAGNOSIS — K3189 Other diseases of stomach and duodenum: Secondary | ICD-10-CM | POA: Diagnosis present

## 2017-06-04 DIAGNOSIS — Z6841 Body Mass Index (BMI) 40.0 and over, adult: Secondary | ICD-10-CM | POA: Diagnosis not present

## 2017-06-04 DIAGNOSIS — E8770 Fluid overload, unspecified: Secondary | ICD-10-CM | POA: Diagnosis not present

## 2017-06-04 DIAGNOSIS — W19XXXA Unspecified fall, initial encounter: Secondary | ICD-10-CM | POA: Diagnosis present

## 2017-06-04 DIAGNOSIS — R6889 Other general symptoms and signs: Secondary | ICD-10-CM

## 2017-06-04 DIAGNOSIS — M7989 Other specified soft tissue disorders: Secondary | ICD-10-CM | POA: Diagnosis not present

## 2017-06-04 DIAGNOSIS — S1093XA Contusion of unspecified part of neck, initial encounter: Secondary | ICD-10-CM | POA: Diagnosis present

## 2017-06-04 DIAGNOSIS — I4891 Unspecified atrial fibrillation: Secondary | ICD-10-CM | POA: Diagnosis not present

## 2017-06-04 DIAGNOSIS — J9600 Acute respiratory failure, unspecified whether with hypoxia or hypercapnia: Secondary | ICD-10-CM

## 2017-06-04 DIAGNOSIS — I480 Paroxysmal atrial fibrillation: Secondary | ICD-10-CM | POA: Diagnosis not present

## 2017-06-04 DIAGNOSIS — I340 Nonrheumatic mitral (valve) insufficiency: Secondary | ICD-10-CM | POA: Diagnosis present

## 2017-06-04 DIAGNOSIS — J069 Acute upper respiratory infection, unspecified: Secondary | ICD-10-CM | POA: Diagnosis not present

## 2017-06-04 DIAGNOSIS — R6521 Severe sepsis with septic shock: Secondary | ICD-10-CM | POA: Diagnosis not present

## 2017-06-04 DIAGNOSIS — D696 Thrombocytopenia, unspecified: Secondary | ICD-10-CM | POA: Diagnosis not present

## 2017-06-04 DIAGNOSIS — K7682 Hepatic encephalopathy: Secondary | ICD-10-CM

## 2017-06-04 DIAGNOSIS — Z90710 Acquired absence of both cervix and uterus: Secondary | ICD-10-CM

## 2017-06-04 DIAGNOSIS — D638 Anemia in other chronic diseases classified elsewhere: Secondary | ICD-10-CM | POA: Diagnosis present

## 2017-06-04 DIAGNOSIS — E785 Hyperlipidemia, unspecified: Secondary | ICD-10-CM | POA: Diagnosis present

## 2017-06-04 DIAGNOSIS — D689 Coagulation defect, unspecified: Secondary | ICD-10-CM | POA: Diagnosis not present

## 2017-06-04 DIAGNOSIS — M549 Dorsalgia, unspecified: Secondary | ICD-10-CM | POA: Diagnosis present

## 2017-06-04 DIAGNOSIS — D539 Nutritional anemia, unspecified: Secondary | ICD-10-CM | POA: Diagnosis not present

## 2017-06-04 DIAGNOSIS — K7200 Acute and subacute hepatic failure without coma: Secondary | ICD-10-CM | POA: Diagnosis not present

## 2017-06-04 DIAGNOSIS — J9601 Acute respiratory failure with hypoxia: Secondary | ICD-10-CM | POA: Diagnosis present

## 2017-06-04 DIAGNOSIS — K7581 Nonalcoholic steatohepatitis (NASH): Secondary | ICD-10-CM | POA: Diagnosis present

## 2017-06-04 DIAGNOSIS — R531 Weakness: Secondary | ICD-10-CM | POA: Diagnosis not present

## 2017-06-04 DIAGNOSIS — Z9114 Patient's other noncompliance with medication regimen: Secondary | ICD-10-CM

## 2017-06-04 DIAGNOSIS — E876 Hypokalemia: Secondary | ICD-10-CM | POA: Diagnosis not present

## 2017-06-04 DIAGNOSIS — K766 Portal hypertension: Secondary | ICD-10-CM | POA: Diagnosis present

## 2017-06-04 DIAGNOSIS — K644 Residual hemorrhoidal skin tags: Secondary | ICD-10-CM | POA: Diagnosis present

## 2017-06-04 DIAGNOSIS — R0902 Hypoxemia: Secondary | ICD-10-CM | POA: Diagnosis not present

## 2017-06-04 DIAGNOSIS — I5033 Acute on chronic diastolic (congestive) heart failure: Secondary | ICD-10-CM | POA: Diagnosis not present

## 2017-06-04 DIAGNOSIS — I130 Hypertensive heart and chronic kidney disease with heart failure and stage 1 through stage 4 chronic kidney disease, or unspecified chronic kidney disease: Secondary | ICD-10-CM | POA: Diagnosis present

## 2017-06-04 DIAGNOSIS — S299XXA Unspecified injury of thorax, initial encounter: Secondary | ICD-10-CM | POA: Diagnosis not present

## 2017-06-04 DIAGNOSIS — R490 Dysphonia: Secondary | ICD-10-CM | POA: Diagnosis not present

## 2017-06-04 DIAGNOSIS — Z885 Allergy status to narcotic agent status: Secondary | ICD-10-CM

## 2017-06-04 DIAGNOSIS — G9340 Encephalopathy, unspecified: Secondary | ICD-10-CM | POA: Diagnosis not present

## 2017-06-04 DIAGNOSIS — R188 Other ascites: Secondary | ICD-10-CM | POA: Diagnosis not present

## 2017-06-04 DIAGNOSIS — K269 Duodenal ulcer, unspecified as acute or chronic, without hemorrhage or perforation: Secondary | ICD-10-CM | POA: Diagnosis present

## 2017-06-04 DIAGNOSIS — K209 Esophagitis, unspecified: Secondary | ICD-10-CM | POA: Diagnosis present

## 2017-06-04 DIAGNOSIS — D509 Iron deficiency anemia, unspecified: Secondary | ICD-10-CM | POA: Diagnosis present

## 2017-06-04 DIAGNOSIS — K9423 Gastrostomy malfunction: Secondary | ICD-10-CM | POA: Diagnosis not present

## 2017-06-04 DIAGNOSIS — Z96651 Presence of right artificial knee joint: Secondary | ICD-10-CM | POA: Diagnosis present

## 2017-06-04 DIAGNOSIS — D6959 Other secondary thrombocytopenia: Secondary | ICD-10-CM | POA: Diagnosis present

## 2017-06-04 DIAGNOSIS — R112 Nausea with vomiting, unspecified: Secondary | ICD-10-CM

## 2017-06-04 DIAGNOSIS — R404 Transient alteration of awareness: Secondary | ICD-10-CM | POA: Diagnosis not present

## 2017-06-04 DIAGNOSIS — Z01818 Encounter for other preprocedural examination: Secondary | ICD-10-CM

## 2017-06-04 DIAGNOSIS — F329 Major depressive disorder, single episode, unspecified: Secondary | ICD-10-CM | POA: Diagnosis present

## 2017-06-04 DIAGNOSIS — Z9289 Personal history of other medical treatment: Secondary | ICD-10-CM

## 2017-06-04 DIAGNOSIS — D692 Other nonthrombocytopenic purpura: Secondary | ICD-10-CM | POA: Diagnosis present

## 2017-06-04 DIAGNOSIS — Z7982 Long term (current) use of aspirin: Secondary | ICD-10-CM

## 2017-06-04 DIAGNOSIS — N183 Chronic kidney disease, stage 3 (moderate): Secondary | ICD-10-CM | POA: Diagnosis present

## 2017-06-04 DIAGNOSIS — Z79891 Long term (current) use of opiate analgesic: Secondary | ICD-10-CM

## 2017-06-04 DIAGNOSIS — Z4659 Encounter for fitting and adjustment of other gastrointestinal appliance and device: Secondary | ICD-10-CM

## 2017-06-04 DIAGNOSIS — L0390 Cellulitis, unspecified: Secondary | ICD-10-CM | POA: Diagnosis present

## 2017-06-04 DIAGNOSIS — R4702 Dysphasia: Secondary | ICD-10-CM | POA: Diagnosis present

## 2017-06-04 DIAGNOSIS — T797XXA Traumatic subcutaneous emphysema, initial encounter: Secondary | ICD-10-CM

## 2017-06-04 DIAGNOSIS — Z931 Gastrostomy status: Secondary | ICD-10-CM

## 2017-06-04 DIAGNOSIS — I864 Gastric varices: Secondary | ICD-10-CM | POA: Diagnosis present

## 2017-06-04 DIAGNOSIS — J90 Pleural effusion, not elsewhere classified: Secondary | ICD-10-CM | POA: Diagnosis not present

## 2017-06-04 LAB — BASIC METABOLIC PANEL
ANION GAP: 5 (ref 5–15)
ANION GAP: 5 (ref 5–15)
Anion gap: 5 (ref 5–15)
BUN: 45 mg/dL — AB (ref 6–20)
BUN: 40 mg/dL — ABNORMAL HIGH (ref 6–20)
BUN: 36 mg/dL — ABNORMAL HIGH (ref 6–20)
CALCIUM: 7.7 mg/dL — AB (ref 8.9–10.3)
CALCIUM: 7.9 mg/dL — AB (ref 8.9–10.3)
CHLORIDE: 110 mmol/L (ref 101–111)
CO2: 20 mmol/L — ABNORMAL LOW (ref 22–32)
CO2: 18 mmol/L — AB (ref 22–32)
CO2: 19 mmol/L — AB (ref 22–32)
CREATININE: 2.48 mg/dL — AB (ref 0.44–1.00)
Calcium: 8.1 mg/dL — ABNORMAL LOW (ref 8.9–10.3)
Chloride: 110 mmol/L (ref 101–111)
Chloride: 112 mmol/L — ABNORMAL HIGH (ref 101–111)
Creatinine, Ser: 2.23 mg/dL — ABNORMAL HIGH (ref 0.44–1.00)
Creatinine, Ser: 2.15 mg/dL — ABNORMAL HIGH (ref 0.44–1.00)
GFR calc Af Amer: 23 mL/min — ABNORMAL LOW (ref >60)
GFR calc Af Amer: 26 mL/min — ABNORMAL LOW (ref >60)
GFR calc Af Amer: 27 mL/min — ABNORMAL LOW (ref >60)
GFR calc non Af Amer: 22 mL/min — ABNORMAL LOW (ref >60)
GFR calc non Af Amer: 23 mL/min — ABNORMAL LOW (ref >60)
GFR, EST NON AFRICAN AMERICAN: 20 mL/min — AB (ref >60)
GLUCOSE: 108 mg/dL — AB (ref 65–99)
GLUCOSE: 130 mg/dL — AB (ref 65–99)
Glucose, Bld: 85 mg/dL (ref 65–99)
Potassium: 7.5 mmol/L (ref 3.5–5.1)
Potassium: 6.7 mmol/L (ref 3.5–5.1)
Potassium: 6.2 mmol/L — ABNORMAL HIGH (ref 3.5–5.1)
SODIUM: 135 mmol/L (ref 135–145)
Sodium: 135 mmol/L (ref 135–145)
Sodium: 134 mmol/L — ABNORMAL LOW (ref 135–145)

## 2017-06-04 LAB — COMPREHENSIVE METABOLIC PANEL
ALBUMIN: 2.5 g/dL — AB (ref 3.5–5.0)
ALT: 26 U/L (ref 14–54)
AST: 59 U/L — AB (ref 15–41)
Alkaline Phosphatase: 148 U/L — ABNORMAL HIGH (ref 38–126)
Anion gap: 11 (ref 5–15)
BUN: 40 mg/dL — AB (ref 6–20)
CHLORIDE: 104 mmol/L (ref 101–111)
CO2: 18 mmol/L — AB (ref 22–32)
Calcium: 8.7 mg/dL — ABNORMAL LOW (ref 8.9–10.3)
Creatinine, Ser: 2.75 mg/dL — ABNORMAL HIGH (ref 0.44–1.00)
GFR calc Af Amer: 20 mL/min — ABNORMAL LOW (ref >60)
GFR calc non Af Amer: 17 mL/min — ABNORMAL LOW (ref >60)
GLUCOSE: 94 mg/dL (ref 65–99)
Sodium: 133 mmol/L — ABNORMAL LOW (ref 135–145)
Total Bilirubin: 7.6 mg/dL — ABNORMAL HIGH (ref 0.3–1.2)
Total Protein: 6.3 g/dL — ABNORMAL LOW (ref 6.5–8.1)

## 2017-06-04 LAB — CBC WITH DIFFERENTIAL/PLATELET
BASOS PCT: 0 %
Basophils Absolute: 0 10*3/uL (ref 0.0–0.1)
EOS PCT: 2 %
Eosinophils Absolute: 0.2 10*3/uL (ref 0.0–0.7)
HEMATOCRIT: 27.6 % — AB (ref 36.0–46.0)
Hemoglobin: 10 g/dL — ABNORMAL LOW (ref 12.0–15.0)
LYMPHS ABS: 1.7 10*3/uL (ref 0.7–4.0)
Lymphocytes Relative: 14 %
MCH: 42 pg — ABNORMAL HIGH (ref 26.0–34.0)
MCHC: 36.2 g/dL — AB (ref 30.0–36.0)
MCV: 116 fL — AB (ref 78.0–100.0)
MONOS PCT: 7 %
Monocytes Absolute: 0.9 10*3/uL (ref 0.1–1.0)
NEUTROS ABS: 9.4 10*3/uL — AB (ref 1.7–7.7)
Neutrophils Relative %: 77 %
Platelets: 114 10*3/uL — ABNORMAL LOW (ref 150–400)
RBC: 2.38 MIL/uL — AB (ref 3.87–5.11)
RDW: 19 % — AB (ref 11.5–15.5)
WBC: 12.2 10*3/uL — ABNORMAL HIGH (ref 4.0–10.5)

## 2017-06-04 LAB — AMMONIA: AMMONIA: 163 umol/L — AB (ref 9–35)

## 2017-06-04 LAB — GLUCOSE, CAPILLARY: Glucose-Capillary: 113 mg/dL — ABNORMAL HIGH (ref 65–99)

## 2017-06-04 LAB — I-STAT TROPONIN, ED: Troponin i, poc: 0.33 ng/mL (ref 0.00–0.08)

## 2017-06-04 LAB — PROTIME-INR
INR: 2.02
PROTHROMBIN TIME: 22.7 s — AB (ref 11.4–15.2)

## 2017-06-04 LAB — MRSA PCR SCREENING: MRSA by PCR: NEGATIVE

## 2017-06-04 LAB — I-STAT ARTERIAL BLOOD GAS, ED
ACID-BASE DEFICIT: 5 mmol/L — AB (ref 0.0–2.0)
BICARBONATE: 19.2 mmol/L — AB (ref 20.0–28.0)
O2 Saturation: 99 %
TCO2: 20 mmol/L — AB (ref 22–32)
pCO2 arterial: 28.6 mmHg — ABNORMAL LOW (ref 32.0–48.0)
pH, Arterial: 7.432 (ref 7.350–7.450)
pO2, Arterial: 112 mmHg — ABNORMAL HIGH (ref 83.0–108.0)

## 2017-06-04 LAB — PROCALCITONIN: Procalcitonin: 0.19 ng/mL

## 2017-06-04 LAB — I-STAT CG4 LACTIC ACID, ED
LACTIC ACID, VENOUS: 4.82 mmol/L — AB (ref 0.5–1.9)
Lactic Acid, Venous: 4.13 mmol/L (ref 0.5–1.9)
Lactic Acid, Venous: 1.91 mmol/L — ABNORMAL HIGH (ref 0.5–1.9)

## 2017-06-04 LAB — URINALYSIS, ROUTINE W REFLEX MICROSCOPIC
Bilirubin Urine: NEGATIVE
GLUCOSE, UA: NEGATIVE mg/dL
Hgb urine dipstick: NEGATIVE
KETONES UR: NEGATIVE mg/dL
LEUKOCYTES UA: NEGATIVE
Nitrite: NEGATIVE
PH: 6 (ref 5.0–8.0)
Protein, ur: NEGATIVE mg/dL
Specific Gravity, Urine: 1.011 (ref 1.005–1.030)

## 2017-06-04 LAB — CBG MONITORING, ED: GLUCOSE-CAPILLARY: 82 mg/dL (ref 65–99)

## 2017-06-04 LAB — CORTISOL: Cortisol, Plasma: 9.4 ug/dL

## 2017-06-04 LAB — TROPONIN I: TROPONIN I: 0.15 ng/mL — AB (ref <0.03)

## 2017-06-04 LAB — LACTIC ACID, PLASMA: LACTIC ACID, VENOUS: 1.7 mmol/L (ref 0.5–1.9)

## 2017-06-04 LAB — ACETAMINOPHEN LEVEL: Acetaminophen (Tylenol), Serum: <10 ug/mL — ABNORMAL LOW (ref 10–30)

## 2017-06-04 LAB — BRAIN NATRIURETIC PEPTIDE: B Natriuretic Peptide: 332.8 pg/mL — ABNORMAL HIGH (ref 0.0–100.0)

## 2017-06-04 MED ORDER — SODIUM BICARBONATE 8.4 % IV SOLN: 100.0000 meq | Freq: Once | INTRAVENOUS | Status: DC

## 2017-06-04 MED ORDER — INSULIN ASPART 100 UNIT/ML ~~LOC~~ SOLN: 10.0000 [IU] | Freq: Once | SUBCUTANEOUS | Status: DC

## 2017-06-04 MED ORDER — SODIUM BICARBONATE 8.4 % IV SOLN
Freq: Once | INTRAVENOUS | Status: AC
  Administered 2017-06-04: 22:00:00 via INTRAVENOUS
  Filled 2017-06-04: qty 150

## 2017-06-04 MED ORDER — DEXTROSE 50 % IV SOLN
1.0000 | Freq: Once | INTRAVENOUS | Status: AC
  Administered 2017-06-04: 50 mL via INTRAVENOUS
  Filled 2017-06-04: qty 50

## 2017-06-04 MED ORDER — DEXTROSE 5 % IV SOLN
Freq: Once | INTRAVENOUS | Status: AC
  Administered 2017-06-04: 18:00:00 via INTRAVENOUS
  Filled 2017-06-04: qty 100

## 2017-06-04 MED ORDER — DEXTROSE 50 % IV SOLN: 50.0000 mL | Freq: Once | INTRAVENOUS | Status: DC

## 2017-06-04 MED ORDER — LACTULOSE ENEMA
300.0000 mL | Freq: Two times a day (BID) | ORAL | Status: DC
  Administered 2017-06-04 – 2017-06-07 (×6): 300 mL via RECTAL
  Filled 2017-06-04 (×7): qty 300

## 2017-06-04 MED ORDER — SODIUM CHLORIDE 0.9 % IV BOLUS (SEPSIS)
500.0000 mL | Freq: Once | INTRAVENOUS | Status: AC
  Administered 2017-06-04: 500 mL via INTRAVENOUS

## 2017-06-04 MED ORDER — SODIUM CHLORIDE 0.9 % IV BOLUS (SEPSIS)
1000.0000 mL | Freq: Once | INTRAVENOUS | Status: AC
  Administered 2017-06-04: 1000 mL via INTRAVENOUS

## 2017-06-04 MED ORDER — SODIUM CHLORIDE 0.9 % IV BOLUS (SEPSIS): 1000.0000 mL | Freq: Once | INTRAVENOUS | Status: DC

## 2017-06-04 MED ORDER — INSULIN ASPART 100 UNIT/ML IV SOLN
10.0000 [IU] | Freq: Once | INTRAVENOUS | Status: AC
  Administered 2017-06-04: 10 [IU] via INTRAVENOUS
  Filled 2017-06-04: qty 0.1

## 2017-06-04 MED ORDER — IPRATROPIUM BROMIDE 0.02 % IN SOLN
0.5000 mg | Freq: Once | RESPIRATORY_TRACT | Status: AC
  Administered 2017-06-04: 0.5 mg via RESPIRATORY_TRACT
  Filled 2017-06-04: qty 2.5

## 2017-06-04 MED ORDER — RIFAXIMIN 550 MG PO TABS
550.0000 mg | ORAL_TABLET | Freq: Two times a day (BID) | ORAL | Status: DC
  Administered 2017-06-05 – 2017-06-14 (×15): 550 mg via ORAL
  Filled 2017-06-04 (×21): qty 1

## 2017-06-04 MED ORDER — LACTULOSE 10 GM/15ML PO SOLN
20.0000 g | Freq: Two times a day (BID) | ORAL | Status: DC
  Filled 2017-06-04: qty 30

## 2017-06-04 MED ORDER — PIPERACILLIN-TAZOBACTAM 3.375 G IVPB
3.3750 g | Freq: Three times a day (TID) | INTRAVENOUS | Status: DC
  Administered 2017-06-05 – 2017-06-09 (×14): 3.375 g via INTRAVENOUS
  Filled 2017-06-04 (×16): qty 50

## 2017-06-04 MED ORDER — PIPERACILLIN-TAZOBACTAM 3.375 G IVPB: 3.3750 g | Freq: Three times a day (TID) | INTRAVENOUS | Status: DC

## 2017-06-04 MED ORDER — SODIUM CHLORIDE 0.9 % IV BOLUS (SEPSIS)
1000.0000 mL | Freq: Once | INTRAVENOUS | Status: AC
Start: 2017-06-04 — End: 2017-06-04
  Administered 2017-06-04: 1000 mL via INTRAVENOUS

## 2017-06-04 MED ORDER — SODIUM CHLORIDE 0.9 % IV SOLN
1.0000 g | Freq: Once | INTRAVENOUS | Status: AC
  Administered 2017-06-04: 1 g via INTRAVENOUS
  Filled 2017-06-04: qty 10

## 2017-06-04 MED ORDER — SODIUM CHLORIDE 0.9 % IV SOLN
250.0000 mL | INTRAVENOUS | Status: DC | PRN
  Administered 2017-06-10 – 2017-06-13 (×2): 250 mL via INTRAVENOUS

## 2017-06-04 MED ORDER — PIPERACILLIN-TAZOBACTAM 3.375 G IVPB 30 MIN
3.3750 g | Freq: Once | INTRAVENOUS | Status: AC
  Administered 2017-06-04: 3.375 g via INTRAVENOUS
  Filled 2017-06-04: qty 50

## 2017-06-04 MED ORDER — ALBUTEROL SULFATE (2.5 MG/3ML) 0.083% IN NEBU
5.0000 mg | INHALATION_SOLUTION | Freq: Once | RESPIRATORY_TRACT | Status: AC
  Administered 2017-06-04: 5 mg via RESPIRATORY_TRACT
  Filled 2017-06-04: qty 6

## 2017-06-04 MED ORDER — SODIUM CHLORIDE 0.9 % IV SOLN
1.0000 g | Freq: Once | INTRAVENOUS | Status: AC
  Administered 2017-06-04: 1 g via INTRAVENOUS
  Filled 2017-06-04 (×2): qty 10

## 2017-06-04 MED ORDER — ASPIRIN EC 81 MG PO TBEC
81.0000 mg | DELAYED_RELEASE_TABLET | Freq: Every day | ORAL | Status: DC
  Filled 2017-06-04 (×2): qty 1

## 2017-06-04 MED ORDER — ALBUTEROL SULFATE (2.5 MG/3ML) 0.083% IN NEBU
5.0000 mg | INHALATION_SOLUTION | Freq: Once | RESPIRATORY_TRACT | Status: DC
  Filled 2017-06-04: qty 6

## 2017-06-04 MED ORDER — VANCOMYCIN HCL IN DEXTROSE 1-5 GM/200ML-% IV SOLN
1000.0000 mg | Freq: Once | INTRAVENOUS | Status: AC
  Administered 2017-06-04: 1000 mg via INTRAVENOUS
  Filled 2017-06-04: qty 200

## 2017-06-04 MED ORDER — PHENYLEPHRINE HCL 10 MG/ML IJ SOLN
0.0000 ug/min | Freq: Once | INTRAVENOUS | Status: AC
  Administered 2017-06-04: 20 ug/min via INTRAVENOUS
  Filled 2017-06-04: qty 1

## 2017-06-04 MED ORDER — SODIUM POLYSTYRENE SULFONATE 15 GM/60ML PO SUSP
45.0000 g | Freq: Once | ORAL | Status: AC
  Administered 2017-06-04: 45 g via RECTAL
  Filled 2017-06-04: qty 180

## 2017-06-04 MED ORDER — DEXTROSE 5 % IV SOLN
0.0000 ug/min | INTRAVENOUS | Status: DC
  Administered 2017-06-04: 150 ug/min via INTRAVENOUS
  Administered 2017-06-04: 60 ug/min via INTRAVENOUS
  Administered 2017-06-04: 100 ug/min via INTRAVENOUS
  Administered 2017-06-04: 30 ug/min via INTRAVENOUS
  Administered 2017-06-05 (×2): 170 ug/min via INTRAVENOUS
  Administered 2017-06-05: 200 ug/min via INTRAVENOUS
  Administered 2017-06-05: 170 ug/min via INTRAVENOUS
  Filled 2017-06-04 (×8): qty 1

## 2017-06-04 NOTE — ED Provider Notes (Addendum)
  Face-to-face evaluation   History: She presents for evaluation of confusion and weakness.  She was apparently found on the floor after she fell.  It is unclear how she got there.  She has not complained of injuries.  Family members are concerned that she is not taking her medicines as prescribed, so they are planning to take her to their home.  She currently lives with her husband.  Physical exam: Alert patient, obese, responsive to questions, confused.  Heart-regular rate and rhythm no murmur.  Lungs clear to auscultation.  Abdomen- obese, soft, does not have the appearance of distention.  No palpable fluid wave.  Skin with scattered ecchymosis.  16: 30- delay of care for IV access, second peripheral line just obtained now, IV fluids infusing wide open, attempting to get antibiotics, from pharmacy, to infuse, in a timely manner.   16: 56-Zosyn now infusing, additional peripheral line started left arm by IV therapy, now.  17:12-Neo-Synephrine being started now  Medical screening examination/treatment/procedure(s) were conducted as a shared visit with non-physician practitioner(s) and myself.  I personally evaluated the patient during the encounter  17: 20-critical care, with patient at this time.   .Critical Care Performed by: Daleen Bo, MD Authorized by: Daleen Bo, MD   Critical care provider statement:    Critical care time (minutes):  110   Critical care start time:  06/02/2017 3:05 PM   Critical care end time:  05/09/2017 5:23 PM   Critical care was necessary to treat or prevent imminent or life-threatening deterioration of the following conditions:  Cardiac failure, circulatory failure and sepsis   Critical care was time spent personally by me on the following activities:  Blood draw for specimens, development of treatment plan with patient or surrogate, discussions with consultants, examination of patient, obtaining history from patient or surrogate, ordering and  performing treatments and interventions, ordering and review of laboratory studies, ordering and review of radiographic studies, re-evaluation of patient's condition and review of old charts   .procdoc    End of life care discussions with family members, sister and daughter, who understand the critical nature of the patient's current presentation.  They are agreeable with short-term intubation, and cardiac resuscitation, as needed for survivable conditions.   Daleen Bo, MD 06/03/2017 Jamal Maes    Daleen Bo, MD 05/18/2017 [phone]

## 2017-06-04 NOTE — H&P (Signed)
PULMONARY / CRITICAL CARE MEDICINE   Name: Leah Shah MRN: [id number] DOB: [birth year]    ADMISSION DATE:  05/14/2017 CONSULTATION DATE: 05/25/2017   REFERRING MD:  Eulis Foster  CHIEF COMPLAINT:  AMS  HISTORY OF PRESENT ILLNESS:  Pt is encephelopathic; therefore, this HPI is obtained from chart review. Leah Shah is a 61 y.o. female with PMH as outlined below including cirrhosis, chronic hepatitis, portal hypertension, recently diagnosed Wilson's disease (mildly elevated ceruloplasmin, 24 hr urinary copper, liver biopsy copper via transjugular liver biopsy 05/07/17, NO Beatriz Chancellor rings on ophthalmology eval).  She is followed by Dr. Marius Ditch at [facility] and she is apparently being evaluated for liver transplant at [facility].  She presented to [facility] ED 11/30 with altered mental status.  She has apparently been confused for the past several days per her daughter-in-law.  Daughter-in-law and additional family had been trying to convince her to move in with family so that they could help her with daily activities and to ensure that she would take her medications as prescribed.  Patient has apparently not been good about taking her medicines as prescribed and misses several doses on a daily basis.  Due to this, she was supposed to move in with daughter-in-law on 11/30.  When daughter-in-law called her that morning, there was no answer despite several calls.  Daughter-in-law then went to patient's home and found her on the floor sitting next to the chair.  Patient had told her that she apparently had a fall, was able to tell that she did not hit her head or lose consciousness. She had not had any recent complaints and had reportedly been in her usual state of health besides for confusion (felt to be due to missing lactulose), and decreased PO intake.  In ED, she was found to be encephalopathic, hypotensive, hypothermic.  Potassium was greater than 7.5 (she received temporizing measures) , ammonia  163, lactate 4.13 (repeat 4.82 > 1.91).  There was a delay in obtaining IV access therefore at the time of our evaluation, patient had only received 2 L of IV fluids.  She has an additional 2 L pending.  She is being covered with broad-spectrum antibiotics including vancomycin and Zosyn.  She has been started on Neo-Synephrine due to MAP in the 50s.  I had extensive discussion with patient's daughter-in-law and sister.  They state that they desire full CODE STATUS for now.  If patient is intubated and we are not making progress after a few days of supportive care; they are willing to have further discussions regarding CODE STATUS and goals of care.  Of note, she has had extensive workup of her liver disease including normal  iron & ferritin, Hepatitis total A Ab positive, Hepatitis B surface Ag negative, Hepatitis B surface Ab mildly positive,& Hepatitis C Ab negative,HIV negative, ANA negative, AMA & anti-SMA negative, LKM ab negative, Immunoglobulins normal, A-1 antitrypsin normal, celiac serology negative.  As mentioned above, she recently had mildly elevated ceruloplasmin, 24 hr urinary copper, liver biopsy copper via transjugular liver biopsy 05/07/17, NO [facility] rings on ophthalmology eval.   PAST MEDICAL HISTORY :  She  has a past medical history of Anxiety, Arthritis, Chronic diastolic CHF (congestive heart failure) ([place]), Cirrhosis ([place]), Dysrhythmia, Headache(784.0), History of stress test, Hyperlipidemia, Hypertension, Moderate mitral regurgitation, Morbid obesity ([place]), Osteoarthritis, and Recurrent upper respiratory infection (URI).  PAST SURGICAL HISTORY: She  has a past surgical history that includes Abdominal hysterectomy; TMJ Arthroplasty; Heel spur surgery; Knee arthroscopy; Coccyx  removal; Eye surgery; Cholecystectomy; Knee Arthroplasty (07/28/2011); I&D extremity (08/27/2011); Joint replacement (Right, 2012); Colonoscopy with propofol (N/A, 03/11/2017); Esophagogastroduodenoscopy  (egd) with propofol (N/A, 03/11/2017); and IR Transcatheter BX (05/07/2017).  Allergies  Allergen Reactions  . Bacitracin-Neomycin-Polymyxin Other (See Comments)  . Codeine Other (See Comments)    Itching "she can take some forms of it" Itching "she can take some forms of it"  . Gabapentin Nausea Only  . Neosporin  [Neomycin-Bacitracin Zn-Polymyx]   . Pregabalin Other (See Comments)    stomach issues stomach issues    No current facility-administered medications on file prior to encounter.    Current Outpatient Medications on File Prior to Encounter  Medication Sig  . albuterol (PROVENTIL HFA;VENTOLIN HFA) 108 (90 Base) MCG/ACT inhaler Inhale 2 puffs into the lungs every 6 (six) hours as needed for wheezing or shortness of breath.  . alprazolam (XANAX) 2 MG tablet Take 1 tablet (2 mg total) 2 (two) times daily as needed by mouth for anxiety.  [name] amoxicillin (AMOXIL) 500 MG capsule Take 1,000 mg 2 (two) times daily by mouth.  [name] aspirin 81 MG EC tablet Take 1 tablet (81 mg total) by mouth daily.  [name] atenolol (TENORMIN) 100 MG tablet TAKE 1 TABLET (100 MG TOTAL) BY MOUTH DAILY.  . cephALEXin (KEFLEX) 500 MG capsule Take 1 capsule (500 mg total) by mouth every 12 (twelve) hours.  . citalopram (CELEXA) 10 MG tablet Take 1 tablet (10 mg total) by mouth daily.  . cyclobenzaprine (FLEXERIL) 5 MG tablet TAKE ONE AND ONE-HALF TABLETS BY MOUTH EVERY NIGHT AT BEDTIME  . fluconazole (DIFLUCAN) 200 MG tablet Take 200 mg daily by mouth.  . furosemide (LASIX) 40 MG tablet TAKE 2 TABLETS (80 MG TOTAL) BY MOUTH DAILY. IN THE MORNING  . hydrOXYzine (ATARAX/VISTARIL) 25 MG tablet Take 1 tablet (25 mg total) every 8 (eight) hours as needed by mouth.  [name] KLOR-CON M20 20 MEQ tablet Take 60 mEq by mouth daily.  [name] lactulose (CHRONULAC) 10 GM/15ML solution Take 15 mLs (10 g total) by mouth 2 (two) times daily.  . magnesium oxide (MAG-OX) 400 (241.3 Mg) MG tablet Take 1 tablet 2 (two) times daily by mouth.  .  Magnesium Oxide 400 (240 Mg) MG TABS TAKE 1 TABLET BY MOUTH TWICE A DAY  . omeprazole (PRILOSEC) 40 MG capsule Take 1 capsule (40 mg total) by mouth 2 (two) times daily before a meal.  . oxyCODONE (OXY IR/ROXICODONE) 5 MG immediate release tablet Take 1 tablet (5 mg total) every 8 (eight) hours as needed by mouth for severe pain.  . polyethylene glycol (MIRALAX / GLYCOLAX) packet Take 17 g by mouth daily as needed for mild constipation.  . rifaximin (XIFAXAN) 550 MG TABS tablet Take 1 tablet (550 mg total) 2 (two) times daily by mouth.  . spironolactone (ALDACTONE) 100 MG tablet Take 1 tablet (100 mg total) by mouth daily.  . traZODone (DESYREL) 150 MG tablet Take 1 tablet by mouth daily as needed.  . Vitamin D, Ergocalciferol, (DRISDOL) 50000 units CAPS capsule Take 1 capsule (50,000 Units total) by mouth every 7 (seven) days.    FAMILY HISTORY:  Her indicated that her mother is deceased. She indicated that her father is deceased. She indicated that her sister is alive. She indicated that her maternal uncle is deceased.   SOCIAL HISTORY: She  reports that she quit smoking about 2 months ago. Her smoking use included cigarettes. She has a 12.50 pack-year smoking history. she has never used  smokeless tobacco. She reports that she does not drink alcohol or use drugs.  REVIEW OF SYSTEMS:  Unable to obtain as pt is encephalopathic.  SUBJECTIVE:  Opens eyes, able to tell me where she is but is otherwise confused.  VITAL SIGNS: BP (!) 85/40 (BP Location: Right Wrist)   Pulse 65   Temp (!) 95.7 F (35.4 C) (Rectal)   Resp 12   Ht 5\' 8"  (1.727 m)   Wt 127 kg (280 lb)   SpO2 100%   BMI 42.57 kg/m   HEMODYNAMICS:    VENTILATOR SETTINGS:    INTAKE / OUTPUT: No intake/output data recorded.   PHYSICAL EXAMINATION: General: Morbidly obese female, jaundiced, chronically ill appearing, in NAD. Neuro: Awake but confused.  No focal deficits - withdraws all extremities to pain. HEENT:  [place]/AT. PERRL, scleral icterus noted. Cardiovascular: RRR, no M/R/G.  Lungs: Respirations even and unlabored.  CTA bilaterally, No W/R/R.  Abdomen: Morbidly obese, BS hypoactive, soft, NT/ND.  Musculoskeletal: No gross deformities, 3+ pitting edema. Skin: Jaundiced, warm, no rashes.  LABS:  BMET Recent Labs  Lab 05/26/2017 1300  NA 133*  K >7.5*  CL 104  CO2 18*  BUN 40*  CREATININE 2.75*  GLUCOSE 94    Electrolytes Recent Labs  Lab 05/06/2017 1300  CALCIUM 8.7*    CBC Recent Labs  Lab 05/25/2017 1300  WBC 12.2*  HGB 10.0*  HCT 27.6*  PLT 114*    Coag's Recent Labs  Lab 05/17/2017 1541  INR 2.02    Sepsis Markers Recent Labs  Lab 05/09/2017 1357 05/12/2017 1442 05/31/2017 1726  LATICACIDVEN 4.13* 4.82* 1.91*    ABG No results for input(s): PHART, PCO2ART, PO2ART in the last 168 hours.  Liver Enzymes Recent Labs  Lab 05/30/2017 1300  AST 59*  ALT 26  ALKPHOS 148*  BILITOT 7.6*  ALBUMIN 2.5*    Cardiac Enzymes No results for input(s): TROPONINI, PROBNP in the last 168 hours.  Glucose Recent Labs  Lab 06/03/2017 1704  GLUCAP 82    Imaging Dg Chest Port 1 View  Result Date: 05/13/2017 CLINICAL DATA:  [place].  Weakness. EXAM: PORTABLE CHEST 1 VIEW COMPARISON:  04/03/2017. FINDINGS: Interval patient rotation to the right. Decreased inspiration. Interval mildly enlarged cardiac silhouette, magnified by the poor inspiration and portable AP technique. The interstitial markings remain prominent with no Kerley lines seen. No pleural fluid. Normal vascularity. Unremarkable bones. IMPRESSION: Poor inspiration with borderline cardiomegaly and chronic interstitial lung disease. Electronically Signed   By: Claudie Revering M.D.   On: 05/06/2017 14:50    STUDIES:  CXR 11/30 > chronic interstitial disease.  CULTURES: Blood 11/30 >  Urine 11/30 >   ANTIBIOTICS: Vanc 11/30 >  Zosyn 11/30 >   SIGNIFICANT EVENTS: 11/30 > admit.  LINES/TUBES: ETT 11/30 >  Radial  a line pending 11/30 >  DISCUSSION: 61 y.o. female with PMH including cirrhosis, chronic hepatitis, portal hypertension, recently diagnosed Wilson's disease (mildly elevated ceruloplasmin, 24 hr urinary copper, liver biopsy copper via transjugular liver biopsy 05/07/17, NO [facility] rings on ophthalmology eval).  She is followed by Dr. Marius Ditch at [facility] and she is apparently being evaluated for liver transplant at [facility]. She presented [facility] 11/30 with acute encephalopathy and sepsis of unclear etiology.  She was subsequently admitted to the ICU for further eval and management.   ASSESSMENT / PLAN:  PULMONARY A: At risk for intubation if mental status worsens. P:   Monitor closely in ICU. Assess ABG.  CARDIOVASCULAR  A:  Sepsis - of unclear etiology, possible peritonitis in setting ascites / cirrhosis.  A repeat sepsis assessment has been performed. Troponin bump - due to above. Hx HTN, HLD, chronic dCHF (echo from Feb 2018 with EF 48-01%, LV diastolic dysfunction, mild MR, mod LA dilation). P:  Continue supportive care - complete 30 mL's per KG IV fluids. If needed, continue vasopressors to support goal MAP greater than 60. Insert arterial line. Might need central access. Trend troponin, lactate. Repeat EKG. Assess echo. Continue preadmission ASA. Hold preadmission atenolol, furosemide, spironolactone.  RENAL A:   Hyponatremia - presumed due to hypovolemia, cirrhosis. Hyperkalemia - s/p temporizing measures in ED.  Repeat labs with persistent hyperkalemia. NAGMA. AKI. P:   Repeat 1 amp D50, 10u insulin, 1g Ca gluconate, 5mg  albuterol neb. Add 2 amps HCO3 and 45g kayexalate per rectum. Continue HCO3 gtt. Repeat STAT BMP and then q2hrs. Might need renal consult for emergent dialysis if unable to correct K with meds.  GASTROINTESTINAL A:   Hx cirrhosis, chronic hepatitis, recent dx Wilson's disease (followed by Dr. Marius Ditch at [facility]) - reportedly being worked  up for transplant at [facility]. Hyperbilirubinemia - chronic, due to above. ? Peritonitis - in setting ascites. Morbid obesity. Nutrition. P:   Day team to please notify GI of admission. Continue empiric abx. Can consider para for completeness. NPO.  HEMATOLOGIC A:   Anemia - chronic. Thrombocytopenia - chronic. VTE Prophylaxis. P:  Transfuse for Hgb < 7. SCD's only for now. CBC in AM.  INFECTIOUS A:   Sepsis - of unclear etiology, possible peritonitis in setting ascites / cirrhosis.  A repeat sepsis assessment has been performed. P:   Abx as above (vanc / zosyn).  Follow cultures as above. PCT algorithm to limit abx exposure.  ENDOCRINE A: No acute issues. P: No interventions required.  NEUROLOGIC A:   Acute encephalopathy - presumed multifactorial in setting hepatic encephalopathy + sepsis. Hx headache, anxiety. P:   Continue lactulose, rifaximin. Continue supportive care. Avoid sedating meds. Hold preadmission alprazolam, citalopram, cyclobenzaprine, hydroxyzine, oxy IR, trazodone.  Family updated: Daughter-in-law and sister updated extensively in ED conference room.  Updated on pt's condition and plan moving forward.  Family desires FULL CODE status for now, but only on a short term basis.  If pt were to decline and ICU team felt that she had poor prognosis for meaningful recovery, family would be willing to have further discussions regarding goals of care / code status.  Interdisciplinary Family Meeting v Palliative Care Meeting:  Due by: 06/10/17.  CC time: 50 minutes.   Montey Hora, [facility] Pager: [phone]  or [phone] 05/30/2017, 5:56 PM  I reviewed the findings with the NP/PA and I concur

## 2017-06-04 NOTE — ED Notes (Signed)
EKG given to Dr. Wentz 

## 2017-06-04 NOTE — ED Triage Notes (Signed)
Pt self-reported fall to daughter-in-law who found her sitting on floor by chair. Pt has a hx of falls.

## 2017-06-04 NOTE — ED Notes (Signed)
RT in room for Art Line placement

## 2017-06-04 NOTE — ED Notes (Signed)
IV team in room for placement

## 2017-06-04 NOTE — Progress Notes (Signed)
eLink Physician-Brief Progress Note Patient Name: Leah Shah DOB: [birth year] MRN: [id number]   Date of Service  05/20/2017  HPI/Events of Note  Contacted by bedside nurse. Potassium decreasing and now 6.7. Patient continuing on bicarbonate infusion. Still hypotensive with mean arterial pressure below goal despite maximal peripherally infused Neo-Synephrine. Notably lactic acid is stable & Procalcitonin is marginal. Troponin I only mildly elevated.   eICU Interventions  1. Adding on serum cortisol to previous labs 2. Echocardiogram already ordered by the admitting physician 3. Normal saline 500 mL bolus over one hour 4. Intensivist to assess patient at bedside for placement of central venous catheter versus temporary hemodialysis catheter versus continued fluid resuscitation      Intervention Category Major Interventions: Shock - evaluation and management  Tera Partridge 05/20/2017, 9:30 PM

## 2017-06-04 NOTE — ED Notes (Signed)
MD in room for US IV placement

## 2017-06-04 NOTE — Progress Notes (Signed)
Admitted patient into room 12m10 from the ER, lethargic, but responsive. Oriented to name, birthday and place.neosynephrine gtt cont. Iv bolus saline infusing. [place] physician notified

## 2017-06-04 NOTE — Procedures (Signed)
Arterial Catheter Insertion Procedure Note Leah Shah [id number] [birth year]  Procedure: Insertion of Arterial Catheter  Indications: Blood pressure monitoring  Procedure Details Consent: Unable to obtain consent because of emergent medical necessity. Time Out: Verified patient identification, verified procedure, site/side was marked, verified correct patient position, special equipment/implants available, medications/allergies/relevent history reviewed, required imaging and test results available.  Performed  Maximum sterile technique was used including gloves, gown, hand hygiene and mask. Skin prep: Chlorhexidine; local anesthetic administered 20 gauge catheter was inserted into right radial artery using the Seldinger technique.  Evaluation Blood flow good; BP tracing good. Complications: No apparent complications.  Arterial line placed under sterile conditions in RR with good blood flow and waveform noted on monitor. VS within normal limits. RT will continue to monitor.    Leah Shah 06/01/2017

## 2017-06-04 NOTE — Progress Notes (Signed)
Pharmacy Antibiotic Note  Leah Shah is a 61 y.o. female admitted on 05/18/2017 with intra-abdominal infection.  Pharmacy has been consulted for Zosyn dosing. Afebrile, LA 4.13.  Plan: Zosyn 3.375g IV (74min inf) x1 Monitor clinical progress, c/s, renal function F/u de-escalation plan/LOT F/u SCr on admit for Zosyn maintenance doses   Height: 5\' 8"  (172.7 cm) Weight: 280 lb (127 kg) IBW/kg (Calculated) : 63.9  Temp (24hrs), Avg:98.7 F (37.1 C), Min:98.7 F (37.1 C), Max:98.7 F (37.1 C)  Recent Labs  Lab 05/26/2017 1357  LATICACIDVEN 4.13*    CrCl cannot be calculated (Patient's most recent lab result is older than the maximum 21 days allowed.).    Allergies  Allergen Reactions  . Bacitracin-Neomycin-Polymyxin Other (See Comments)  . Codeine Other (See Comments)    Itching "she can take some forms of it" Itching "she can take some forms of it"  . Gabapentin Nausea Only  . Neosporin  [Neomycin-Bacitracin Zn-Polymyx]   . Pregabalin Other (See Comments)    stomach issues stomach issues    Elicia Lamp, PharmD, BCPS Clinical Pharmacist 05/07/2017 2:33 PM

## 2017-06-04 NOTE — ED Notes (Signed)
Spoke to Agricultural consultant, Designer, television/film set. Pt does not have to wait until 7:30 for transport, may head up in approximately 5-10 minutes.

## 2017-06-04 NOTE — Patient Outreach (Signed)
[facility]) Care Management  05/15/2017  Leah Shah 1955/09/23 [id number]   TELEPHONE SCREENING Referral date: 06/02/17 Referral source: primary  MD Referral reason: requesting Home O2 and assist with getting metal ramp for getting out of her home.  Patient also having difficulty getting prescription recently ordered (Xifaxan which would cost her $600 out of pocket)  Insurance: [employer]  Telephone call to patient. Unable to reach patient. HIPAA compliant voice message left with call back phone number.  Upon chart review patient currently in the emergency room.  PLAN: RNCM will attempt 2nd telephone outreach to patient within 5 business days.   Quinn Plowman RN,BSN,CCM [facility] Telephonic  [phone]

## 2017-06-04 NOTE — ED Provider Notes (Signed)
[place] EMERGENCY DEPARTMENT Provider Note   CSN: [id number] Arrival date & time: 05/16/2017  1304     History   Chief Complaint Chief Complaint  Patient presents with  . Fall  . Weakness    HPI Leah Shah is a 61 y.o. female.  HPI   61 year old female with hx of chronic pain with opioid dependency, afib not on anticoagulant, CHF, COPD, brought here via EMS from home for evaluation of a fall.  History is limited as patient appears confused.  Per family member who is at bedside, for the past week patient appears to be more weak and confused than usual.  For the past 2 days she has been coughing productive with sputum.  She also complaining of having abdominal discomfort.  She does have history of liver failure and currently in the process of trying to get liver transplant.  She is unable to afford her liver medication.  Patient reports today when she got off from her bed, leg felt weak and she fell down to the floor.  She denies hitting her head or loss of consciousness.  She was unable to get up and may have been down on the floor for 2-3 hours before her daughter came by to see her.  She does endorse her chronic back pain but no other additional pain.  Level 5 caveat applies due to her poor mental status.  No report of dysuria, no vomiting or diarrhea.  She denies alcohol abuse.  Liver failure may be secondary to chronic opiate use with Tylenol.  She does endorse increased trouble breathing.  She is eating and drinking less.  Past Medical History:  Diagnosis Date  . Anxiety   . Arthritis    R knee, L knee - OA  . Chronic diastolic CHF (congestive heart failure) ([place])    a. 06/2016 Echo: EF 60-65%, no rwma, mod MR, mildly dil LA, nl RV fxn, PASP 35mmHg.  [name] Cirrhosis ([place])   . Dysrhythmia    "mild arrythmia after taking Redux diet med years ago"no cardio fi  . Headache(784.0)   . History of stress test    a. 06/2016 MV: EF 67%, no ischemia/infarct.  .  Hyperlipidemia   . Hypertension    echoJefm Bryant, wnl 2009? , had taken Redux for diet management  but now  off the market. pt, told that echo wnl.    . Moderate mitral regurgitation    a. 06/2016 Echo: EF 60-65%, mod MR.  . Morbid obesity ([place])   . Osteoarthritis   . Recurrent upper respiratory infection (URI)    treated /w OTC med.     Patient Active Problem List   Diagnosis Date Noted  . UTI (urinary tract infection) 04/03/2017  . PUD (peptic ulcer disease) 04/02/2017  . 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 Portal hypertensive gastropathy ([place]) 04/02/2017  . Abnormal ECG 02/16/2017  . Edema extremities 02/16/2017  . Family history of diabetes mellitus 02/16/2017  . Melanocytic nevus of skin 02/16/2017  . Spasm 02/16/2017  . Screening for thyroid disorder 02/16/2017  . Flu vaccine need 02/16/2017  . Hyperbilirubinemia 01/11/2017  . Cirrhosis ([place]) 01/11/2017  . Alkaline phosphatase elevation 01/11/2017  . COPD exacerbation ([place]) 09/08/2016  . Centrilobular emphysema ([place])   . Chronic diastolic CHF (congestive heart failure) ([place])   . Extreme obesity   . Hypertensive heart disease with heart failure ([place])   . Atrial fibrillation ([place]) 08/27/2016  . Congestive heart failure (  CHF) ([place]) 08/27/2016  . Respiratory crackles at right lung base 04/29/2016  . Acute confusion following injury ([place]) 02/24/2016  . Hyperglycemia 12/12/2015  . Bilateral swelling of feet and ankles 11/13/2015  . 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 Dyslipidemia 06/16/2015  . MI (mitral incompetence) 06/16/2015  . DDD (degenerative disc disease), lumbar 06/07/2015  . Primary osteoarthritis of left knee 06/07/2015  . Status post right knee replacement 06/07/2015  . Acute right hip pain 05/16/2015  . Leg pain 04/22/2015  . Abnormal LFTs 04/15/2015  . BP (high blood pressure) 04/15/2015  . Reduced mobility 04/15/2015  . Chronic  urticaria 04/15/2015  . Hyperlipidemia 03/14/2015  . Chronic pain of left knee 01/31/2015  . Gonalgia 01/10/2015  . Clinical depression 01/10/2015  . Benign hypertension 01/10/2015  . Cannot sleep 01/10/2015  . Current tobacco use 01/10/2015  . LBP (low back pain) 12/13/2014  . Continuous opioid dependence ([place]) 12/13/2014  . Anxiety 12/13/2014  . Cervical spine pain 12/13/2014  . Pain and swelling of left knee 12/13/2014    Past Surgical History:  Procedure Laterality Date  . ABDOMINAL HYSTERECTOMY     partial-uterus removed  . CHOLECYSTECTOMY     2011  . COCCYX REMOVAL     [facility]  . COLONOSCOPY WITH PROPOFOL N/A 03/11/2017   Procedure: COLONOSCOPY WITH PROPOFOL;  Surgeon: Lin Landsman, MD;  Location: [facility] ENDOSCOPY;  Service: Gastroenterology;  Laterality: N/A;  . ESOPHAGOGASTRODUODENOSCOPY (EGD) WITH PROPOFOL N/A 03/11/2017   Procedure: ESOPHAGOGASTRODUODENOSCOPY (EGD) WITH PROPOFOL;  Surgeon: Lin Landsman, MD;  Location: [facility] ENDOSCOPY;  Service: Gastroenterology;  Laterality: N/A;  . EYE SURGERY     states both lens replaced. Not sure if cataract surgery or not.  [name] HEEL SPUR SURGERY    . I&D EXTREMITY  08/27/2011   Procedure: IRRIGATION AND DEBRIDEMENT EXTREMITY;  Surgeon: [name], MD;  Location: [place];  Service: Orthopedics;  Laterality: Right;  . IR TRANSCATHETER BX  05/07/2017  . JOINT REPLACEMENT Right 2012   Right TKR  . KNEE ARTHROPLASTY  07/28/2011   Procedure: COMPUTER ASSISTED TOTAL KNEE ARTHROPLASTY;  Surgeon: [name], MD;  Location: [place];  Service: Orthopedics;  Laterality: Right;  right total knee arthroplasty, computer assisted  . KNEE ARTHROSCOPY     right knee  . TMJ ARTHROPLASTY     [facility]- [facility]    OB History    No data available       Home Medications    Prior to Admission medications   Medication Sig Start Date End Date Taking? Authorizing Provider  albuterol (PROVENTIL HFA;VENTOLIN HFA) 108 (90 Base) MCG/ACT  inhaler Inhale 2 puffs into the lungs every 6 (six) hours as needed for wheezing or shortness of breath. 08/29/16   Loletha Grayer, MD  alprazolam Duanne Moron) 2 MG tablet Take 1 tablet (2 mg total) 2 (two) times daily as needed by mouth for anxiety. 05/20/17 06/19/17  Roselee Nova, MD  amoxicillin (AMOXIL) 500 MG capsule Take 1,000 mg 2 (two) times daily by mouth. 03/12/17   [provider]  aspirin 81 MG EC tablet Take 1 tablet (81 mg total) by mouth daily. 08/30/16   Loletha Grayer, MD  atenolol (TENORMIN) 100 MG tablet TAKE 1 TABLET (100 MG TOTAL) BY MOUTH DAILY. 03/29/17   Roselee Nova, MD  cephALEXin (KEFLEX) 500 MG capsule Take 1 capsule (500 mg total) by mouth every 12 (twelve) hours. 04/07/17  Fritzi Mandes, MD  citalopram (CELEXA) 10 MG tablet Take 1 tablet (10 mg total) by mouth daily. 02/04/17   Roselee Nova, MD  cyclobenzaprine (FLEXERIL) 5 MG tablet TAKE ONE AND ONE-HALF TABLETS BY MOUTH EVERY NIGHT AT BEDTIME 03/04/17   Rochel Brome A, MD  fluconazole (DIFLUCAN) 200 MG tablet Take 200 mg daily by mouth. 03/18/17   [provider]  furosemide (LASIX) 40 MG tablet TAKE 2 TABLETS (80 MG TOTAL) BY MOUTH DAILY. IN THE MORNING 04/17/17   [provider]  hydrOXYzine (ATARAX/VISTARIL) 25 MG tablet Take 1 tablet (25 mg total) every 8 (eight) hours as needed by mouth. 05/20/17   Roselee Nova, MD  KLOR-CON M20 20 MEQ tablet Take 60 mEq by mouth daily. 03/25/17   [provider]  lactulose (CHRONULAC) 10 GM/15ML solution Take 15 mLs (10 g total) by mouth 2 (two) times daily. 04/07/17   Fritzi Mandes, MD  magnesium oxide (MAG-OX) 400 (241.3 Mg) MG tablet Take 1 tablet 2 (two) times daily by mouth. 04/29/17   [provider]  Magnesium Oxide 400 (240 Mg) MG TABS TAKE 1 TABLET BY MOUTH TWICE A DAY 03/30/17   Vanga, Tally Due, MD  omeprazole (PRILOSEC) 40 MG capsule Take 1 capsule (40 mg total) by mouth 2 (two) times daily before a meal. 03/11/17  06/09/17  Vanga, Tally Due, MD  oxyCODONE (OXY IR/ROXICODONE) 5 MG immediate release tablet Take 1 tablet (5 mg total) every 8 (eight) hours as needed by mouth for severe pain. 05/20/17   Roselee Nova, MD  polyethylene glycol [facility] / Floria Raveling) packet Take 17 g by mouth daily as needed for mild constipation. 04/07/17   Fritzi Mandes, MD  rifaximin (XIFAXAN) 550 MG TABS tablet Take 1 tablet (550 mg total) 2 (two) times daily by mouth. 05/13/17 06/12/17  Lin Landsman, MD  spironolactone (ALDACTONE) 100 MG tablet Take 1 tablet (100 mg total) by mouth daily. 04/07/17 07/06/17  Fritzi Mandes, MD  traZODone (DESYREL) 150 MG tablet Take 1 tablet by mouth daily as needed. 10/13/13   [provider]  Vitamin D, Ergocalciferol, (DRISDOL) 50000 units CAPS capsule Take 1 capsule (50,000 Units total) by mouth every 7 (seven) days. 03/03/17 06/17/17  Lin Landsman, MD    Family History Family History  Problem Relation Age of Onset  . Heart disease Mother   . Hypertension Mother   . Hypertension Father   . Heart disease Sister 63       stent   . Heart attack Sister   . Hyperlipidemia Sister   . Hypertension Sister   . Heart attack Maternal Uncle 64  . Heart disease Maternal Uncle        CABG    Social History Social History   Tobacco Use  . Smoking status: Former Smoker    Packs/day: 0.25    Years: 50.00    Pack years: 12.50    Types: Cigarettes    Last attempt to quit: 03/20/2017    Years since quitting: 0.2  . Smokeless tobacco: Never Used  Substance Use Topics  . Alcohol use: No  . Drug use: No     Allergies   Bacitracin-neomycin-polymyxin; Codeine; Gabapentin; Neosporin  [neomycin-bacitracin zn-polymyx]; and Pregabalin   Review of Systems Review of Systems  Unable to perform ROS: Mental status change     Physical Exam Updated Vital Signs BP (!) 87/42 (BP Location: Right Arm)   Pulse 66   Temp  98.7 F (37.1 C) (Oral)   Resp 16   SpO2 97%   Physical  Exam  Constitutional: She appears well-developed and well-nourished. No distress.  Moderately obese female, ill-appearing  HENT:  Head: Atraumatic.  Mouth is dry  Eyes: Conjunctivae and EOM are normal. Pupils are equal, round, and reactive to light. Scleral icterus is present.  Neck: Neck supple.  No nuchal rigidity  Cardiovascular: Normal rate and regular rhythm. Exam reveals no friction rub.  No murmur heard. Pulmonary/Chest:  Decreased breath sounds with occasional wheezes  Abdominal: Soft. She exhibits no distension. There is tenderness (Mild diffuse abdominal tenderness without guarding or rebound tenderness).  Musculoskeletal: She exhibits edema (3+ pitting edema to bilateral lower extremities with chronic venous stasis skin changes.  Difficult to palpate pulses.).  Neurological: She is alert.  Alert to self, unable to tell me why she is here, or the date and time.  Skin: No rash noted.  Jaundice skin  Psychiatric: She has a normal mood and affect.  Nursing note and vitals reviewed.    ED Treatments / Results  Labs (all labs ordered are listed, but only abnormal results are displayed) Labs Reviewed  COMPREHENSIVE METABOLIC PANEL - Abnormal; Notable for the following components:      Result Value   Sodium 133 (*)    Potassium >7.5 (*)    CO2 18 (*)    BUN 40 (*)    Creatinine, Ser 2.75 (*)    Calcium 8.7 (*)    Total Protein 6.3 (*)    Albumin 2.5 (*)    AST 59 (*)    Alkaline Phosphatase 148 (*)    Total Bilirubin 7.6 (*)    GFR calc non Af Amer 17 (*)    GFR calc Af Amer 20 (*)    All other components within normal limits  CBC WITH DIFFERENTIAL/PLATELET - Abnormal; Notable for the following components:   WBC 12.2 (*)    RBC 2.38 (*)    Hemoglobin 10.0 (*)    HCT 27.6 (*)    MCV 116.0 (*)    MCH 42.0 (*)    MCHC 36.2 (*)    RDW 19.0 (*)    Platelets 114 (*)    Neutro Abs 9.4 (*)    All other components within normal limits  BRAIN NATRIURETIC PEPTIDE -  Abnormal; Notable for the following components:   B Natriuretic Peptide 332.8 (*)    All other components within normal limits  ACETAMINOPHEN LEVEL - Abnormal; Notable for the following components:   Acetaminophen (Tylenol), Serum <10 (*)    All other components within normal limits  AMMONIA - Abnormal; Notable for the following components:   Ammonia 163 (*)    All other components within normal limits  I-STAT CG4 LACTIC ACID, ED - Abnormal; Notable for the following components:   Lactic Acid, Venous 4.13 (*)    All other components within normal limits  I-STAT CG4 LACTIC ACID, ED - Abnormal; Notable for the following components:   Lactic Acid, Venous 4.82 (*)    All other components within normal limits  I-STAT TROPONIN, ED - Abnormal; Notable for the following components:   Troponin i, poc 0.33 (*)    All other components within normal limits  CULTURE, BLOOD (ROUTINE X 2)  CULTURE, BLOOD (ROUTINE X 2)  URINE CULTURE  URINALYSIS, ROUTINE W REFLEX MICROSCOPIC  PROTIME-INR    EKG  EKG Interpretation  Date/Time:  Friday June 04 2017 13:37:57 EST Ventricular Rate:  63 PR Interval:    QRS Duration: 126 QT Interval:  456 QTC Calculation: 467 R Axis:   89 Text Interpretation:  Sinus rhythm Nonspecific intraventricular conduction delay since last tracing no significant change Confirmed by Daleen Bo [phone]) on 05/07/2017 2:57:14 PM       Radiology Dg Chest Port 1 View  Result Date: 05/29/2017 CLINICAL DATA:  [place].  Weakness. EXAM: PORTABLE CHEST 1 VIEW COMPARISON:  04/03/2017. FINDINGS: Interval patient rotation to the right. Decreased inspiration. Interval mildly enlarged cardiac silhouette, magnified by the poor inspiration and portable AP technique. The interstitial markings remain prominent with no Kerley lines seen. No pleural fluid. Normal vascularity. Unremarkable bones. IMPRESSION: Poor inspiration with borderline cardiomegaly and chronic interstitial lung  disease. Electronically Signed   By: Claudie Revering M.D.   On: 05/25/2017 14:50    Procedures Procedures (including critical care time)  Medications Ordered in ED Medications  piperacillin-tazobactam (ZOSYN) IVPB 3.375 g (not administered)  sodium chloride 0.9 % bolus 1,000 mL (not administered)    And  sodium chloride 0.9 % bolus 1,000 mL (not administered)    And  sodium chloride 0.9 % bolus 1,000 mL (1,000 mLs Intravenous New Bag/Given 05/12/2017 1620)    And  sodium chloride 0.9 % bolus 1,000 mL (1,000 mLs Intravenous New Bag/Given 05/23/2017 1552)  insulin aspart (novoLOG) injection 10 Units (not administered)  dextrose 50 % solution 50 mL (not administered)  sodium bicarbonate 100 mEq in dextrose 5 % 1,000 mL infusion (not administered)  vancomycin (VANCOCIN) IVPB 1000 mg/200 mL premix (not administered)  piperacillin-tazobactam (ZOSYN) IVPB 3.375 g (not administered)  phenylephrine (NEO-SYNEPHRINE) 10 mg in dextrose 5 % 250 mL (0.04 mg/mL) infusion (not administered)  albuterol (PROVENTIL) (2.5 MG/3ML) 0.083% nebulizer solution 5 mg (5 mg Nebulization Given 06/01/2017 1530)  ipratropium (ATROVENT) nebulizer solution 0.5 mg (0.5 mg Nebulization Given 05/28/2017 1530)  calcium gluconate 1 g in sodium chloride 0.9 % 100 mL IVPB (1 g Intravenous New Bag/Given 06/03/2017 1612)     Initial Impression / Assessment and Plan / ED Course  I have reviewed the triage vital signs and the nursing notes.  Pertinent labs & imaging results that were available during my care of the patient were reviewed by me and considered in my medical decision making (see chart for details).     BP (!) 72/39 (BP Location: Right Wrist)   Pulse 63   Temp (!) 95.7 F (35.4 C) (Rectal)   Resp 11   Ht 5\' 8"  (1.727 m)   Wt 127 kg (280 lb)   SpO2 100%   BMI 42.57 kg/m    Final Clinical Impressions(s) / ED Diagnoses   Final diagnoses:  Fall at home, initial encounter  Hepatic encephalopathy ([place])    Hyperkalemia  Septic shock [facility])    ED Discharge Orders    None     Patient here for generalized weakness, and a low impact fall at standing height.  She does appears to be confused and ill-appearing.  This could be likely due to her liver failure.  Suspect level of hepatic encephalopathy.  She also has been coughing and complaining of abdominal pain which could be the source of infection.  She is found to be hypotensive with blood pressure consistently in the 26R systolic.  She is not tachycardic.  Her skin is cool to the touch, will check rectal temps.  She is pursed lip breathing with occasional wheezes, will provide breathing treatment.  Sepsis - Repeat Assessment  Performed  at:    1448  Vitals     Blood pressure (!) 87/42, pulse 66, temperature (!) 95.7 F (35.4 C), temperature source Rectal, resp. rate 16, height 5\' 8"  (1.727 m), weight 127 kg (280 lb), SpO2 100 %.  Heart:     Regular rate and rhythm  Lungs:    CTA  Capillary Refill:   <2 sec  Peripheral Pulse:   Radial pulse palpable  Skin:     Dry and jaundice     Work up initiated, care discussed with Dr. Eulis Foster.   3:39 PM Labs are mildly abnormal.  Repeat lactic acid is now 4.82 from 4.13.  Elevated white count of 12.2, rectal temp of 95.7.  All of which points to sepsis of an unknown source.  Patient was given broad-spectrum antibiotic including Zosyn and vancomycin.  Aggressive fluid resuscitation at 30 mL/kg. Due to her state of hypothermia, will place pt in a [facility].  Furthermore, patient has an elevated ammonia level of 1.63 consistent with hepatic encephalopathy which explain her level of confusion.  An elevated troponin of 0.33 without EKG changes.  Elevated potassium of greater than 7.5 without significant EKG changes however, patient was given medications (albuterol, calcium gluconate, dextrose, insulin, and bicarb) to combat her high potassium.    Will consult PCCM for admission.   4:20 PM Appreciate  consultation from PCCM Dr. Sabra Heck who notify that there are currently no available bed in the ICU. He request pt to be admit to step down if possible.  I discussed with Dr. Eulis Foster, who felt pt will not be appropriate for Step down due to her condition and the amount of resources available.    4:34 PM Pt's BP decreased to 88K systolic despite fluid resuscitation, will start neosynephrine. I have reached out to intensivist Dr. Sabra Heck who agrees to admit pt for further management.  Care also discussed with oncoming provider who will continue to monitor pt's condition.   CRITICAL CARE Performed by: Domenic Moras Total critical care time: 60 minutes Critical care time was exclusive of separately billable procedures and treating other patients. Critical care was necessary to treat or prevent imminent or life-threatening deterioration. Critical care was time spent personally by me on the following activities: development of treatment plan with patient and/or surrogate as well as nursing, discussions with consultants, evaluation of patient's response to treatment, examination of patient, obtaining history from patient or surrogate, ordering and performing treatments and interventions, ordering and review of laboratory studies, ordering and review of radiographic studies, pulse oximetry and re-evaluation of patient's condition.      Domenic Moras, PA-C 05/25/2017 1640    Domenic Moras, PA-C 05/12/2017 1645    Daleen Bo, MD 05/30/2017 1718    Daleen Bo, MD 05/12/2017 [phone]

## 2017-06-04 NOTE — ED Notes (Signed)
Pt transported to ICU by RN

## 2017-06-04 NOTE — Progress Notes (Addendum)
Dr.Nestor Creig Hines aware of lab results.

## 2017-06-04 NOTE — ED Notes (Signed)
Date and time results received: 05/09/2017 3:22 PM   Test: Potassium Critical Value: >7.5 Name of Provider Notified: Gertie Fey  Orders Received? Or Actions Taken?: Actions Taken: MD notified

## 2017-06-04 NOTE — Progress Notes (Signed)
Pharmacy Antibiotic Note  Leah Shah is a 61 y.o. female admitted on 05/29/2017 with intra-abdominal infection.  Patient has an active order for Zosyn 3.375 gm x 1 dose. Awaiting IV placement. SCr 2.75. CrCl ~ 25-50 mL/min   Plan: Zosyn 3.375 gm IV Q 8 hours (EI infusion) Monitor clinical progress, c/s, renal function F/u de-escalation plan/LOT F/u SCr on admit for Zosyn maintenance doses   Height: 5\' 8"  (172.7 cm) Weight: 280 lb (127 kg) IBW/kg (Calculated) : 63.9  Temp (24hrs), Avg:97.2 F (36.2 C), Min:95.7 F (35.4 C), Max:98.7 F (37.1 C)  Recent Labs  Lab 05/15/2017 1300 05/22/2017 1357 05/16/2017 1442  WBC 12.2*  --   --   CREATININE 2.75*  --   --   LATICACIDVEN  --  4.13* 4.82*    Estimated Creatinine Clearance: 29.8 mL/min (A) (by C-G formula based on SCr of 2.75 mg/dL (H)).    Allergies  Allergen Reactions  . Bacitracin-Neomycin-Polymyxin Other (See Comments)  . Codeine Other (See Comments)    Itching "she can take some forms of it" Itching "she can take some forms of it"  . Gabapentin Nausea Only  . Neosporin  [Neomycin-Bacitracin Zn-Polymyx]   . Pregabalin Other (See Comments)    stomach issues stomach issues    Albertina Parr, PharmD., BCPS Clinical Pharmacist Pager [phone]

## 2017-06-05 ENCOUNTER — Inpatient Hospital Stay (HOSPITAL_COMMUNITY): Payer: Medicare HMO

## 2017-06-05 ENCOUNTER — Other Ambulatory Visit (HOSPITAL_COMMUNITY): Payer: Medicare HMO

## 2017-06-05 DIAGNOSIS — E875 Hyperkalemia: Secondary | ICD-10-CM

## 2017-06-05 DIAGNOSIS — J9601 Acute respiratory failure with hypoxia: Secondary | ICD-10-CM

## 2017-06-05 DIAGNOSIS — A419 Sepsis, unspecified organism: Principal | ICD-10-CM

## 2017-06-05 DIAGNOSIS — K7460 Unspecified cirrhosis of liver: Secondary | ICD-10-CM

## 2017-06-05 DIAGNOSIS — R188 Other ascites: Secondary | ICD-10-CM

## 2017-06-05 DIAGNOSIS — J9600 Acute respiratory failure, unspecified whether with hypoxia or hypercapnia: Secondary | ICD-10-CM

## 2017-06-05 DIAGNOSIS — I340 Nonrheumatic mitral (valve) insufficiency: Secondary | ICD-10-CM

## 2017-06-05 DIAGNOSIS — E870 Hyperosmolality and hypernatremia: Secondary | ICD-10-CM

## 2017-06-05 DIAGNOSIS — K3189 Other diseases of stomach and duodenum: Secondary | ICD-10-CM

## 2017-06-05 LAB — CBC
HEMATOCRIT: 24.8 % — AB (ref 36.0–46.0)
Hemoglobin: 8.8 g/dL — ABNORMAL LOW (ref 12.0–15.0)
MCH: 41.3 pg — ABNORMAL HIGH (ref 26.0–34.0)
MCHC: 35.5 g/dL (ref 30.0–36.0)
MCV: 116.4 fL — ABNORMAL HIGH (ref 78.0–100.0)
Platelets: 115 10*3/uL — ABNORMAL LOW (ref 150–400)
RBC: 2.13 MIL/uL — ABNORMAL LOW (ref 3.87–5.11)
RDW: 19 % — AB (ref 11.5–15.5)
WBC: 11.7 10*3/uL — AB (ref 4.0–10.5)

## 2017-06-05 LAB — COMPREHENSIVE METABOLIC PANEL
ALT: 25 U/L (ref 14–54)
ANION GAP: 7 (ref 5–15)
AST: 47 U/L — AB (ref 15–41)
Albumin: 2.3 g/dL — ABNORMAL LOW (ref 3.5–5.0)
Alkaline Phosphatase: 131 U/L — ABNORMAL HIGH (ref 38–126)
BILIRUBIN TOTAL: 8.2 mg/dL — AB (ref 0.3–1.2)
BUN: 28 mg/dL — ABNORMAL HIGH (ref 6–20)
CHLORIDE: 106 mmol/L (ref 101–111)
CO2: 22 mmol/L (ref 22–32)
Calcium: 8 mg/dL — ABNORMAL LOW (ref 8.9–10.3)
Creatinine, Ser: 1.59 mg/dL — ABNORMAL HIGH (ref 0.44–1.00)
GFR, EST AFRICAN AMERICAN: 39 mL/min — AB (ref >60)
GFR, EST NON AFRICAN AMERICAN: 34 mL/min — AB (ref >60)
Glucose, Bld: 208 mg/dL — ABNORMAL HIGH (ref 65–99)
POTASSIUM: 4.9 mmol/L (ref 3.5–5.1)
Sodium: 135 mmol/L (ref 135–145)
TOTAL PROTEIN: 5.8 g/dL — AB (ref 6.5–8.1)

## 2017-06-05 LAB — BASIC METABOLIC PANEL WITH GFR
Anion gap: 4 — ABNORMAL LOW (ref 5–15)
BUN: 38 mg/dL — ABNORMAL HIGH (ref 6–20)
CO2: 19 mmol/L — ABNORMAL LOW (ref 22–32)
Calcium: 8 mg/dL — ABNORMAL LOW (ref 8.9–10.3)
Chloride: 111 mmol/L (ref 101–111)
Creatinine, Ser: 2.09 mg/dL — ABNORMAL HIGH (ref 0.44–1.00)
GFR calc Af Amer: 28 mL/min — ABNORMAL LOW
GFR calc non Af Amer: 24 mL/min — ABNORMAL LOW
Glucose, Bld: 121 mg/dL — ABNORMAL HIGH (ref 65–99)
Potassium: 6.4 mmol/L (ref 3.5–5.1)
Sodium: 134 mmol/L — ABNORMAL LOW (ref 135–145)

## 2017-06-05 LAB — GLUCOSE, CAPILLARY
Glucose-Capillary: 108 mg/dL — ABNORMAL HIGH (ref 65–99)
Glucose-Capillary: 121 mg/dL — ABNORMAL HIGH (ref 65–99)
Glucose-Capillary: 129 mg/dL — ABNORMAL HIGH (ref 65–99)
Glucose-Capillary: 153 mg/dL — ABNORMAL HIGH (ref 65–99)
Glucose-Capillary: 184 mg/dL — ABNORMAL HIGH (ref 65–99)
Glucose-Capillary: 275 mg/dL — ABNORMAL HIGH (ref 65–99)

## 2017-06-05 LAB — CBC WITH DIFFERENTIAL/PLATELET
BASOS ABS: 0 10*3/uL (ref 0.0–0.1)
Basophils Relative: 0 %
EOS ABS: 0 10*3/uL (ref 0.0–0.7)
Eosinophils Relative: 0 %
HCT: 26.3 % — ABNORMAL LOW (ref 36.0–46.0)
HEMOGLOBIN: 9.3 g/dL — AB (ref 12.0–15.0)
LYMPHS PCT: 9 %
Lymphs Abs: 1.3 10*3/uL (ref 0.7–4.0)
MCH: 41.3 pg — AB (ref 26.0–34.0)
MCHC: 35.4 g/dL (ref 30.0–36.0)
MCV: 116.9 fL — ABNORMAL HIGH (ref 78.0–100.0)
MONO ABS: 0.9 10*3/uL (ref 0.1–1.0)
Monocytes Relative: 6 %
NEUTROS PCT: 85 %
Neutro Abs: 12.2 10*3/uL — ABNORMAL HIGH (ref 1.7–7.7)
PLATELETS: 115 10*3/uL — AB (ref 150–400)
RBC: 2.25 MIL/uL — ABNORMAL LOW (ref 3.87–5.11)
RDW: 19 % — ABNORMAL HIGH (ref 11.5–15.5)
WBC: 14.4 10*3/uL — AB (ref 4.0–10.5)

## 2017-06-05 LAB — BASIC METABOLIC PANEL
ANION GAP: 8 (ref 5–15)
ANION GAP: 5 (ref 5–15)
BUN: 33 mg/dL — ABNORMAL HIGH (ref 6–20)
BUN: 29 mg/dL — ABNORMAL HIGH (ref 6–20)
CALCIUM: 8.3 mg/dL — AB (ref 8.9–10.3)
CALCIUM: 8.2 mg/dL — AB (ref 8.9–10.3)
CO2: 19 mmol/L — AB (ref 22–32)
CO2: 22 mmol/L (ref 22–32)
Chloride: 110 mmol/L (ref 101–111)
Chloride: 109 mmol/L (ref 101–111)
Creatinine, Ser: 1.96 mg/dL — ABNORMAL HIGH (ref 0.44–1.00)
Creatinine, Ser: 1.75 mg/dL — ABNORMAL HIGH (ref 0.44–1.00)
GFR calc non Af Amer: 26 mL/min — ABNORMAL LOW (ref >60)
GFR, EST AFRICAN AMERICAN: 30 mL/min — AB (ref >60)
GFR, EST AFRICAN AMERICAN: 35 mL/min — AB (ref >60)
GFR, EST NON AFRICAN AMERICAN: 30 mL/min — AB (ref >60)
Glucose, Bld: 96 mg/dL (ref 65–99)
Glucose, Bld: 145 mg/dL — ABNORMAL HIGH (ref 65–99)
Potassium: 5.5 mmol/L — ABNORMAL HIGH (ref 3.5–5.1)
Potassium: 5.4 mmol/L — ABNORMAL HIGH (ref 3.5–5.1)
Sodium: 137 mmol/L (ref 135–145)
Sodium: 136 mmol/L (ref 135–145)

## 2017-06-05 LAB — BLOOD GAS, ARTERIAL
ACID-BASE DEFICIT: 3 mmol/L — AB (ref 0.0–2.0)
Acid-base deficit: 4 mmol/L — ABNORMAL HIGH (ref 0.0–2.0)
BICARBONATE: 20.6 mmol/L (ref 20.0–28.0)
Bicarbonate: 21.1 mmol/L (ref 20.0–28.0)
DRAWN BY: 511471
Drawn by: 51806
FIO2: 100
LHR: 14 {breaths}/min
O2 Content: 5 L/min
O2 SAT: 91.2 %
PATIENT TEMPERATURE: 98.6
PCO2 ART: 35.1 mmHg (ref 32.0–48.0)
PEEP: 5 cmH2O
PH ART: 7.359 (ref 7.350–7.450)
Patient temperature: 98.6
VT: 510 mL
pCO2 arterial: 37.4 mmHg (ref 32.0–48.0)
pH, Arterial: 7.396 (ref 7.350–7.450)
pO2, Arterial: 69.9 mmHg — ABNORMAL LOW (ref 83.0–108.0)
pO2, Arterial: 409 mmHg — ABNORMAL HIGH (ref 83.0–108.0)

## 2017-06-05 LAB — PROTIME-INR
INR: 1.88
Prothrombin Time: 21.4 s — ABNORMAL HIGH (ref 11.4–15.2)

## 2017-06-05 LAB — TRIGLYCERIDES: Triglycerides: 96 mg/dL (ref <150)

## 2017-06-05 LAB — TROPONIN I: Troponin I: 0.2 ng/mL (ref <0.03)

## 2017-06-05 LAB — PROCALCITONIN: Procalcitonin: 0.17 ng/mL

## 2017-06-05 LAB — PHOSPHORUS: PHOSPHORUS: 4.8 mg/dL — AB (ref 2.5–4.6)

## 2017-06-05 LAB — URINE CULTURE: Culture: NO GROWTH

## 2017-06-05 LAB — ECHOCARDIOGRAM COMPLETE
Height: 68 in
Weight: 5104.09 oz

## 2017-06-05 LAB — LACTIC ACID, PLASMA
LACTIC ACID, VENOUS: 1.9 mmol/L (ref 0.5–1.9)
LACTIC ACID, VENOUS: 1.8 mmol/L (ref 0.5–1.9)
LACTIC ACID, VENOUS: 2.1 mmol/L — AB (ref 0.5–1.9)

## 2017-06-05 LAB — MAGNESIUM: MAGNESIUM: 2 mg/dL (ref 1.7–2.4)

## 2017-06-05 LAB — AMMONIA: Ammonia: 88 umol/L — ABNORMAL HIGH (ref 9–35)

## 2017-06-05 MED ORDER — SODIUM CHLORIDE 0.9 % IV SOLN
0.0000 ug/min | INTRAVENOUS | Status: DC
  Filled 2017-06-05: qty 8

## 2017-06-05 MED ORDER — FENTANYL CITRATE (PF) 100 MCG/2ML IJ SOLN: 100.0000 ug | INTRAMUSCULAR | Status: DC | PRN

## 2017-06-05 MED ORDER — ETOMIDATE 2 MG/ML IV SOLN
0.3000 mg/kg | Freq: Once | INTRAVENOUS | Status: AC
  Administered 2017-06-05: 20 mg via INTRAVENOUS

## 2017-06-05 MED ORDER — MIDAZOLAM HCL 2 MG/2ML IJ SOLN
INTRAMUSCULAR | Status: AC
Start: 2017-06-05 — End: 2017-06-05
  Administered 2017-06-05: 2 mg
  Filled 2017-06-05: qty 2

## 2017-06-05 MED ORDER — SODIUM CHLORIDE 0.9 % IV SOLN
1.0000 g | Freq: Once | INTRAVENOUS | Status: AC
  Administered 2017-06-05: 1 g via INTRAVENOUS
  Filled 2017-06-05: qty 10

## 2017-06-05 MED ORDER — ORAL CARE MOUTH RINSE
15.0000 mL | Freq: Two times a day (BID) | OROMUCOSAL | Status: DC
  Administered 2017-06-05: 15 mL via OROMUCOSAL

## 2017-06-05 MED ORDER — HYDROCORTISONE NA SUCCINATE PF 100 MG IJ SOLR
50.0000 mg | Freq: Four times a day (QID) | INTRAMUSCULAR | Status: DC
  Administered 2017-06-05 – 2017-06-07 (×11): 50 mg via INTRAVENOUS
  Filled 2017-06-05 (×3): qty 1
  Filled 2017-06-05: qty 2
  Filled 2017-06-05 (×6): qty 1
  Filled 2017-06-05: qty 2
  Filled 2017-06-05: qty 1

## 2017-06-05 MED ORDER — VANCOMYCIN HCL 10 G IV SOLR
1500.0000 mg | INTRAVENOUS | Status: DC
  Administered 2017-06-06: 1500 mg via INTRAVENOUS
  Filled 2017-06-05 (×2): qty 1500

## 2017-06-05 MED ORDER — PHENYLEPHRINE HCL 10 MG/ML IJ SOLN
0.0000 ug/min | INTRAVENOUS | Status: DC
  Filled 2017-06-05: qty 4

## 2017-06-05 MED ORDER — SODIUM CHLORIDE 0.9% FLUSH: 10.0000 mL | INTRAVENOUS | Status: DC | PRN

## 2017-06-05 MED ORDER — ORAL CARE MOUTH RINSE
15.0000 mL | Freq: Two times a day (BID) | OROMUCOSAL | Status: DC
  Administered 2017-06-05 (×2): 15 mL via OROMUCOSAL

## 2017-06-05 MED ORDER — FENTANYL CITRATE (PF) 100 MCG/2ML IJ SOLN
100.0000 ug | INTRAMUSCULAR | Status: DC | PRN
  Administered 2017-06-05 – 2017-06-06 (×2): 100 ug via INTRAVENOUS
  Filled 2017-06-05 (×2): qty 2

## 2017-06-05 MED ORDER — PROPOFOL 1000 MG/100ML IV EMUL
0.0000 ug/kg/min | INTRAVENOUS | Status: DC
  Administered 2017-06-05: 40 ug/kg/min via INTRAVENOUS
  Administered 2017-06-05: 20 ug/kg/min via INTRAVENOUS
  Administered 2017-06-05: 2.5 ug/kg/min via INTRAVENOUS
  Filled 2017-06-05 (×3): qty 100

## 2017-06-05 MED ORDER — PANTOPRAZOLE SODIUM 40 MG IV SOLR
40.0000 mg | INTRAVENOUS | Status: DC
  Administered 2017-06-05: 40 mg via INTRAVENOUS
  Filled 2017-06-05: qty 40

## 2017-06-05 MED ORDER — NOREPINEPHRINE BITARTRATE 1 MG/ML IV SOLN
0.0000 ug/min | INTRAVENOUS | Status: DC
  Administered 2017-06-05: 20 ug/min via INTRAVENOUS
  Administered 2017-06-05: 12 ug/min via INTRAVENOUS
  Administered 2017-06-05: 10 ug/min via INTRAVENOUS
  Administered 2017-06-06: 5 ug/min via INTRAVENOUS
  Filled 2017-06-05 (×4): qty 4

## 2017-06-05 MED ORDER — SODIUM CHLORIDE 0.9% FLUSH
10.0000 mL | Freq: Two times a day (BID) | INTRAVENOUS | Status: DC
  Administered 2017-06-05: 20 mL
  Administered 2017-06-05 – 2017-06-06 (×2): 10 mL
  Administered 2017-06-07: 30 mL
  Administered 2017-06-07 – 2017-06-09 (×3): 10 mL
  Administered 2017-06-10: 20 mL
  Administered 2017-06-11 – 2017-06-14 (×4): 10 mL

## 2017-06-05 MED ORDER — SODIUM CHLORIDE 0.9 % IV SOLN
0.0000 ug/min | INTRAVENOUS | Status: DC
  Administered 2017-06-05: 50 ug/min via INTRAVENOUS
  Filled 2017-06-05: qty 8

## 2017-06-05 MED ORDER — SODIUM CHLORIDE 0.9 % IV SOLN
0.0000 ug/min | INTRAVENOUS | Status: DC
  Administered 2017-06-05: 220 ug/min via INTRAVENOUS
  Administered 2017-06-05: 200 ug/min via INTRAVENOUS
  Administered 2017-06-05: 220 ug/min via INTRAVENOUS
  Filled 2017-06-05 (×4): qty 4

## 2017-06-05 MED ORDER — PHENYLEPHRINE HCL 10 MG/ML IJ SOLN: 0.0000 ug/min | INTRAMUSCULAR | Status: DC

## 2017-06-05 MED ORDER — ALBUMIN HUMAN 25 % IV SOLN
12.5000 g | Freq: Once | INTRAVENOUS | Status: AC
  Administered 2017-06-06: 12.5 g via INTRAVENOUS

## 2017-06-05 MED ORDER — ALBUMIN HUMAN 25 % IV SOLN
25.0000 g | Freq: Two times a day (BID) | INTRAVENOUS | Status: AC
  Administered 2017-06-05 (×2): 25 g via INTRAVENOUS
  Filled 2017-06-05: qty 100
  Filled 2017-06-05 (×3): qty 50

## 2017-06-05 MED ORDER — SODIUM BICARBONATE 8.4 % IV SOLN
INTRAVENOUS | Status: DC
  Administered 2017-06-05 (×2): via INTRAVENOUS
  Filled 2017-06-05 (×6): qty 150

## 2017-06-05 MED ORDER — CHLORHEXIDINE GLUCONATE 0.12 % MT SOLN
15.0000 mL | Freq: Two times a day (BID) | OROMUCOSAL | Status: DC
  Administered 2017-06-05: 15 mL via OROMUCOSAL
  Filled 2017-06-05: qty 15

## 2017-06-05 MED ORDER — DEXTROSE 50 % IV SOLN
1.0000 | Freq: Once | INTRAVENOUS | Status: AC
  Administered 2017-06-05: 50 mL via INTRAVENOUS
  Filled 2017-06-05: qty 50

## 2017-06-05 MED ORDER — INSULIN ASPART 100 UNIT/ML IV SOLN
10.0000 [IU] | Freq: Once | INTRAVENOUS | Status: AC
  Administered 2017-06-05: 10 [IU] via INTRAVENOUS

## 2017-06-05 MED ORDER — SODIUM POLYSTYRENE SULFONATE 15 GM/60ML PO SUSP
30.0000 g | Freq: Once | ORAL | Status: AC
  Administered 2017-06-05: 30 g via RECTAL
  Filled 2017-06-05: qty 120

## 2017-06-05 MED ORDER — CHLORHEXIDINE GLUCONATE CLOTH 2 % EX PADS
6.0000 | MEDICATED_PAD | Freq: Every day | CUTANEOUS | Status: DC
  Administered 2017-06-06 – 2017-06-13 (×6): 6 via TOPICAL

## 2017-06-05 MED ORDER — MIDAZOLAM HCL 2 MG/2ML IJ SOLN
INTRAMUSCULAR | Status: AC
  Administered 2017-06-05: 2 mg
  Filled 2017-06-05: qty 2

## 2017-06-05 MED ORDER — SODIUM CHLORIDE 0.9 % IV BOLUS (SEPSIS)
500.0000 mL | Freq: Once | INTRAVENOUS | Status: AC
  Administered 2017-06-05: 500 mL via INTRAVENOUS

## 2017-06-05 NOTE — Progress Notes (Signed)
RN called RT to assess A-line site.  Upon assessment RT changed A-line dressing due to drainage.  PT is  stable no complications.

## 2017-06-05 NOTE — Progress Notes (Signed)
This RN had placed a [employer] non-invasive arterial pressure monitor on patient earlier in the shift to monitor BP. Dr. Vaughan Browner just gave order to administer a 500cc bolus of normal saline. SV prior to fluid bolus was 67. This RN pushed 250 cc of normal saline approximately over 10 minutes and SV increased to 75, which is more than 10%, per Clearsight guidelines. Dr. Vaughan Browner aware. The rest of the 250cc normal saline infusion given. Total of 500cc normal saline administered.

## 2017-06-05 NOTE — Procedures (Signed)
Intubation Procedure Note Leah Shah [id number] [birth year]  Procedure: Intubation Indications: Respiratory insufficiency  Procedure Details Consent: Unable to obtain consent because of emergent medical necessity. Time Out: Verified patient identification, verified procedure, site/side was marked, verified correct patient position, special equipment/implants available, medications/allergies/relevent history reviewed, required imaging and test results available.  Performed  Maximum sterile technique was used including gloves and mask.  MAC and 3 and size 7.5 ETT at 24cm confirmed with auscultation and co2 detector     Evaluation Hemodynamic Status: BP stable throughout; O2 sats: stable throughout Patient's Current Condition: stable Complications: No apparent complications Patient did tolerate procedure well. Chest X-ray ordered to verify placement.  CXR: pending.   Leah Shah Leah Shah 06/05/2017

## 2017-06-05 NOTE — Consult Note (Signed)
Referring Provider: Critical Care Medicine Primary Care Physician:  Roselee Nova, MD Primary Gastroenterologist: Sherri Sear,  MD ([facility]) and [facility]  Reason for Consultation: cirrhosis, sepsis    Attending physician's note   I have taken a history, examined the patient and reviewed the chart. I agree with the Advanced Practitioner's note, impression and recommendations.   Decompensated cirrhosis with portal HTN, portal gastropathy, gastric varices, splenomegaly, PSE and ascites hospitalized with sepsis of unclear etiology. Currently in MICU on broad spectrum IV antibiotics, pressors and intubated.  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 Some macrovesicular steatosis was also noted on liver biopsy, question burnt out NASH.  Kayser-Fleischer rings not noted on slit lamp exam.  Altered MS is multifactorial with contribution from PSE  Abd [place] and if ascites is present proceed with a diagnostic paracentesis  Continue IV antibiotics Trend BMET, LFTs, PT/INR, CBC, ammonia If abd [place] is not helpful and further search for a source of sepsis is warranted consider abd/pelvic CT however she does not appear stable enough for a CT - defer to Som[place], MD Marval Regal [phone] Mon-Fri 8a-5p [phone] after 5p, weekends, holidays   ASSESSMENT AND PLAN:    54. 61 yo female with cirrhosis complicated by portal HTN. She has portal gastropathy, splenomegaly, hx of PSE, and also ascites on Sept CT scan. Followed by Dr. Marius Ditch. 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 Ceruloplasmin slightly low, urine copper high, liver copper high however no Kayser-Fleishcher rings on outpatient eye exam.    2. Sepsis, unclear etiology on pressors. Workup ongoing. On IV antibiotics. Urine and blood cx negative so far. GI asked to evaluate for ? SBP in setting of ascites. Patient  intubated, can't provide hx so not known what GI sx, if any she had pre-admission. CT scan in late Sept suggests right sided bowel wall thickening but not really specific in setting of ascites.  -difficult to know if / how much ascites present on exam given body habitus. Wouldn't be surprised as she has pitting edema of upper and lower extremities. -Spoke with RN. Radiology coming to room for bedside u/s to evaluate for ascites. If present would recommend a diagnostic tap with fluid studies for cell count, gram stain, culture, and cytology though she has been on antibiotics so cell studies may not be accurate. -Altered mental status probably combination of PSE and critical illness. Xifaxan obviously on hold since intubated at 7 am today but still getting lactulose enemas. Given that she is sedated, probably can hold off on the enemas for now.   3. Acute respiratory failure requiring intubation. Pulmonary edema on cxr.   4. AKI, hyponatremia / severe hyperkalemia.   HPI: Leah Shah is a 61 y.o. female diagnosed with cirrhosis in 2018. Dr. Marius Ditch has closely monitored her diuretics over last few months.  She is morbidly obese, cirrhosis felt to be secondary to Wilson's vs Karlene Lineman.  Ceruloplasmin levels were slightly low, 24-hour urine copper levels were done and suggestive of Wilson's disease.  She was seen by ophthalmology, apparently no evidence for [facility] rings. She was started on lactulose in August for mild encephalopathy.  She is up-to-date on [place] screening, last ultrasound was in July.  Her AFP is mildly elevated, Dr. Marius Ditch following.  MRI also done, no evidence for hepatocellular carcinoma.  She has been undergoing viral hepatitis vaccinations. Patient had screening colonoscopy  recently.  Nonthrombosed external hemorrhoids were found, stool was encountered in the sigmoid colon.  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 She  underwent simultaneous upper endoscopy with findings of a duodenal ulcer.  Her H. pylori serologies were positive, treated with triple therapy.  She also had findings of portal hypertensive gastropathy, type I gastric varices, and mild esophagitis.  Liver biopsy earlier this month revealed chronic hepatitis, grade 2, stage IV, high hepatic copper concentration apparently being evaluated for transplant with [place]. She has had the initial visit with Roosevelt Locks, NP with [facility] in [place].   Patient was admitted yesterday following a fall at home.  In the ED she appeared confused.  Systolic BP was in the 37C with normal heart rate . She complained of a productive cough as well as abdominal discomfort.  She was in mild respiratory distress with pursed lip breathing. Patient reported that she had not been able to afford her" liver medications".  Potassium was critically elevated at greater than 7.5.  White count 12.2, hemoglobin 10.  BNP 332, ammonia 163, lactic acid 4.82 she met sepsis criteria, unknown source.  Given IV fluids.  Toponin was mildly elevated at 0.33 without EKG changes.  Started on Neo-Synephrine.  Started on Zosyn.   Patient required intubation for respiratory insufficiency early this a.m. CCM has spoken with family, patient remains full code.   Past Medical History:  Diagnosis Date  . Anxiety   . Arthritis    R knee, L knee - OA  . Chronic diastolic CHF (congestive heart failure) ([place])    a. 06/2016 Echo: EF 60-65%, no rwma, mod MR, mildly dil LA, nl RV fxn, PASP 75mHg.  .Marland KitchenCirrhosis ([place]   . Dysrhythmia    "mild arrythmia after taking Redux diet med years ago"no cardio fi  . Headache(784.0)   . History of stress test    a. 06/2016 MV: EF 67%, no ischemia/infarct.  . Hyperlipidemia   . Hypertension    echo-[name] wnl 2009? , had taken Redux for diet management  but now  off the market. pt, told that echo wnl.    . Moderate mitral regurgitation      a. 06/2016 Echo: EF 60-65%, mod MR.  . Morbid obesity ([place]   . Osteoarthritis   . Recurrent upper respiratory infection (URI)    treated /w OTC med.     Past Surgical History:  Procedure Laterality Date  . ABDOMINAL HYSTERECTOMY     partial-uterus removed  . CHOLECYSTECTOMY     2011  . COCCYX REMOVAL     2004-[facility] . COLONOSCOPY WITH PROPOFOL N/A 03/11/2017   Procedure: COLONOSCOPY WITH PROPOFOL;  Surgeon: VLin Landsman MD;  [facility];  Service: Gastroenterology;  Laterality: N/A;  . ESOPHAGOGASTRODUODENOSCOPY (EGD) WITH PROPOFOL N/A 03/11/2017   Procedure: ESOPHAGOGASTRODUODENOSCOPY (EGD) WITH PROPOFOL;  Surgeon: VLin Landsman MD;  [facility];  Service: Gastroenterology;  Laterality: N/A;  . EYE SURGERY     states both lens replaced. Not sure if cataract surgery or not.  .Marland KitchenHEEL SPUR SURGERY    . I&D EXTREMITY  08/27/2011   Procedure: IRRIGATION AND DEBRIDEMENT EXTREMITY;  Surgeon: GMeredith Pel MD;  [place]  Service: Orthopedics;  Laterality: Right;  . IR TRANSCATHETER BX  05/07/2017  . JOINT REPLACEMENT Right 2012   Right TKR  . KNEE ARTHROPLASTY  07/28/2011   Procedure: COMPUTER ASSISTED TOTAL  KNEE ARTHROPLASTY;  Surgeon: [name], MD;  Location: [place];  Service: Orthopedics;  Laterality: Right;  right total knee arthroplasty, computer assisted  . KNEE ARTHROSCOPY     right knee  . TMJ ARTHROPLASTY     [place]    Prior to Admission medications   Medication Sig Start Date End Date Taking? Authorizing Provider  albuterol (PROVENTIL HFA;VENTOLIN HFA) 108 (90 Base) MCG/ACT inhaler Inhale 2 puffs into the lungs every 6 (six) hours as needed for wheezing or shortness of breath. 08/29/16  Yes Wieting, Richard, MD  alprazolam Duanne Moron) 2 MG tablet Take 1 tablet (2 mg total) 2 (two) times daily as needed by mouth for anxiety. 05/20/17 06/19/17 Yes Roselee Nova, MD  aspirin 81 MG EC tablet Take 1 tablet (81 mg  total) by mouth daily. 08/30/16  Yes Wieting, Richard, MD  atenolol (TENORMIN) 100 MG tablet TAKE 1 TABLET (100 MG TOTAL) BY MOUTH DAILY. 03/29/17  Yes Roselee Nova, MD  citalopram (CELEXA) 10 MG tablet Take 1 tablet (10 mg total) by mouth daily. 02/04/17  Yes Roselee Nova, MD  cyclobenzaprine (FLEXERIL) 5 MG tablet TAKE ONE AND ONE-HALF TABLETS BY MOUTH EVERY NIGHT AT BEDTIME 03/04/17  Yes Rochel Brome A, MD  furosemide (LASIX) 40 MG tablet Take 40 mg by mouth daily.   Yes [provider]  hydrOXYzine (ATARAX/VISTARIL) 25 MG tablet Take 1 tablet (25 mg total) every 8 (eight) hours as needed by mouth. 05/20/17  Yes Roselee Nova, MD  lactulose (CHRONULAC) 10 GM/15ML solution Take 15 mLs (10 g total) by mouth 2 (two) times daily. Patient taking differently: Take 10 g by mouth 3 (three) times daily.  04/07/17  Yes Fritzi Mandes, MD  magnesium oxide (MAG-OX) 400 (241.3 Mg) MG tablet Take 1 tablet 2 (two) times daily by mouth. 04/29/17  Yes [provider]  omeprazole (PRILOSEC) 40 MG capsule Take 1 capsule (40 mg total) by mouth 2 (two) times daily before a meal. 03/11/17 06/09/17 Yes Vanga, Tally Due, MD  oxyCODONE (OXY IR/ROXICODONE) 5 MG immediate release tablet Take 1 tablet (5 mg total) every 8 (eight) hours as needed by mouth for severe pain. 05/20/17  Yes Rochel Brome A, MD  potassium chloride SA (K-DUR,KLOR-CON) 20 MEQ tablet Take 20 mEq by mouth 2 (two) times daily.   Yes [provider]  spironolactone (ALDACTONE) 100 MG tablet Take 1 tablet (100 mg total) by mouth daily. 04/07/17 07/06/17 Yes Fritzi Mandes, MD  traZODone (DESYREL) 150 MG tablet Take 1 tablet by mouth daily as needed. 10/13/13  Yes [provider]  Vitamin D, Ergocalciferol, (DRISDOL) 50000 units CAPS capsule Take 1 capsule (50,000 Units total) by mouth every 7 (seven) days. 03/03/17 06/17/17 Yes Vanga, Tally Due, MD  cephALEXin (KEFLEX) 500 MG capsule Take 1 capsule (500 mg total) by  mouth every 12 (twelve) hours. Patient not taking: Reported on 05/26/2017 04/07/17   Fritzi Mandes, MD  fluconazole (DIFLUCAN) 200 MG tablet Take 200 mg daily by mouth. 03/18/17   [provider]  Magnesium Oxide 400 (240 Mg) MG TABS TAKE 1 TABLET BY MOUTH TWICE A DAY Patient not taking: Reported on 05/07/2017 03/30/17   Lin Landsman, MD  polyethylene glycol (MIRALAX / Floria Raveling) packet Take 17 g by mouth daily as needed for mild constipation. Patient not taking: Reported on 05/24/2017 04/07/17   Fritzi Mandes, MD  rifaximin (XIFAXAN) 550 MG TABS tablet Take 1 tablet (550  mg total) 2 (two) times daily by mouth. Patient not taking: Reported on 05/25/2017 05/13/17 06/12/17  Lin Landsman, MD    Current Facility-Administered Medications  Medication Dose Route Frequency Provider Last Rate Last Dose  . 0.9 %  sodium chloride infusion  250 mL Intravenous PRN Desai, Rahul P, PA-C      . albuterol (PROVENTIL) (2.5 MG/3ML) 0.083% nebulizer solution 5 mg  5 mg Nebulization Once Desai, Rahul P, PA-C      . aspirin EC tablet 81 mg  81 mg Oral Daily Desai, Rahul P, PA-C      . chlorhexidine (PERIDEX) 0.12 % solution 15 mL  15 mL Mouth Rinse BID Mannam, Praveen, MD      . Chlorhexidine Gluconate Cloth 2 % PADS 6 each  6 each Topical Daily Omar Person, NP      . fentaNYL (SUBLIMAZE) injection 100 mcg  100 mcg Intravenous Q15 min PRN Omar Person, NP      . fentaNYL (SUBLIMAZE) injection 100 mcg  100 mcg Intravenous Q2H PRN Omar Person, NP      . hydrocortisone sodium succinate (SOLU-CORTEF) 100 MG injection 50 mg  50 mg Intravenous Q6H Javier Glazier, MD   50 mg at 06/05/17 1339  . lactulose (CHRONULAC) enema 200 gm  300 mL Rectal BID Omar Person, NP   300 mL at 06/05/17 0919  . MEDLINE mouth rinse  15 mL Mouth Rinse q12n4p Mannam, Praveen, MD   15 mL at 06/05/17 1305  . norepinephrine (LEVOPHED) 4 mg in dextrose 5 % 250 mL (0.016 mg/mL) infusion  0-40 mcg/min  Intravenous Titrated Mannam, Praveen, MD 37.5 mL/hr at 06/05/17 1358 10 mcg/min at 06/05/17 1358  . phenylephrine (NEO-SYNEPHRINE) 40 mg in sodium chloride 0.9 % 250 mL (0.16 mg/mL) infusion  0-400 mcg/min Intravenous Titrated Jesus Genera, MD 105 mL/hr at 06/05/17 1300 280 mcg/min at 06/05/17 1300  . piperacillin-tazobactam (ZOSYN) IVPB 3.375 g  3.375 g Intravenous Q8H Lavenia Atlas, RPH   Stopped at 06/05/17 1304  . propofol (DIPRIVAN) 1000 MG/100ML infusion  0-50 mcg/kg/min Intravenous Continuous Omar Person, NP 8.7 mL/hr at 06/05/17 1240 10 mcg/kg/min at 06/05/17 1240  . rifaximin (XIFAXAN) tablet 550 mg  550 mg Oral BID Desai, Rahul P, PA-C      . sodium bicarbonate 150 mEq in dextrose 5 % 1,000 mL infusion   Intravenous Continuous Omar Person, NP 125 mL/hr at 06/05/17 1112    . sodium chloride flush (NS) 0.9 % injection 10-40 mL  10-40 mL Intracatheter Q12H Omar Person, NP   10 mL at 06/05/17 0901  . sodium chloride flush (NS) 0.9 % injection 10-40 mL  10-40 mL Intracatheter PRN Omar Person, NP      . Derrill Memo ON 06/06/2017] vancomycin (VANCOCIN) 1,500 mg in sodium chloride 0.9 % 500 mL IVPB  1,500 mg Intravenous Q24H Harvel Quale, [facility]        Allergies as of 05/15/2017 - Review Complete 05/28/2017  Allergen Reaction Noted  . Bacitracin-neomycin-polymyxin Other (See Comments) 03/14/2015  . Codeine Other (See Comments) 05/12/2011  . Gabapentin Nausea Only 12/13/2014  . Neosporin  [neomycin-bacitracin zn-polymyx]  12/13/2014  . Pregabalin Other (See Comments) 12/13/2014    Family History  Problem Relation Age of Onset  . Heart disease Mother   . Hypertension Mother   . Hypertension Father   . Heart disease Sister 27       stent   .  Heart attack Sister   . Hyperlipidemia Sister   . Hypertension Sister   . Heart attack Maternal Uncle 64  . Heart disease Maternal Uncle        CABG    Social History   Socioeconomic History  . Marital  status: Married    Spouse name: Not on file  . Number of children: Not on file  . Years of education: Not on file  . Highest education level: Not on file  Social Needs  . Financial resource strain: Not on file  . Food insecurity - worry: Not on file  . Food insecurity - inability: Not on file  . Transportation needs - medical: Not on file  . Transportation needs - non-medical: Not on file  Occupational History  . Not on file  Tobacco Use  . Smoking status: Former Smoker    Packs/day: 0.25    Years: 50.00    Pack years: 12.50    Types: Cigarettes    Last attempt to quit: 03/20/2017    Years since quitting: 0.2  . Smokeless tobacco: Never Used  Substance and Sexual Activity  . Alcohol use: No  . Drug use: No  . Sexual activity: Not Currently  Other Topics Concern  . Not on file  Social History Narrative   Lives in [place] with husband.  Does not routinely exercise.    Review of Systems: All systems reviewed and negative except where noted in HPI.  Physical Exam: Vital signs in last 24 hours: Temp:  [95.7 F (35.4 C)-98.3 F (36.8 C)] 98 F (36.7 C) (12/01 1203) Pulse Rate:  [55-82] 72 (12/01 1305) Resp:  [10-24] 11 (12/01 1305) BP: (72-130)/(33-60) 110/57 (12/01 1300) SpO2:  [79 %-100 %] 100 % (12/01 1305) Arterial Line BP: (75-134)/(28-51) 85/47 (12/01 1305) FiO2 (%):  [40 %-100 %] 40 % (12/01 1128) Weight:  [315 lb 0.6 oz (142.9 kg)-319 lb 0.1 oz (144.7 kg)] 319 lb 0.1 oz (144.7 kg) (12/01 0500) Last BM Date: 06/05/17 General:   Morbidly obese white female sedated and intubated.  Psych:  Unable to evaluate.  Neck:  Supple; no masses Lungs:  Intubated. Chest is some crackles a few course breath sounds.   Heart:  Regular rate and rhythm, pitting edema of bilateral upper and lower extremities.  Abdomen:  Soft, obese, no palp mass    Rectal:  Deferred  Msk:  Symmetrical without gross deformities. . Neurologic:  sedated Skin:  Intact without significant  lesions or rashes..   Intake/Output from previous day: 11/30 0701 - 12/01 0700 In: 3366.1 [I.V.:3086.1; IV Piggyback:160] Out: 2250 [Urine:2250] Intake/Output this shift: Total I/O In: 1344.8 [I.V.:1344.8] Out: 460 [Urine:460]  Lab Results: Recent Labs    05/31/2017 1300 06/05/17 0527  WBC 12.2* 11.7*  HGB 10.0* 8.8*  HCT 27.6* 24.8*  PLT 114* 115*   BMET Recent Labs    06/05/17 0000 06/05/17 0527 06/05/17 0910  NA 134* 137 136  K 6.4* 5.5* 5.4*  CL 111 110 109  CO2 19* 19* 22  GLUCOSE 121* 96 145*  BUN 38* 33* 29*  CREATININE 2.09* 1.96* 1.75*  CALCIUM 8.0* 8.3* 8.2*   LFT Recent Labs    05/09/2017 1300  PROT 6.3*  ALBUMIN 2.5*  AST 59*  ALT 26  ALKPHOS 148*  BILITOT 7.6*   PT/INR Recent Labs    05/26/2017 1541 06/05/17 0527  LABPROT 22.7* 21.4*  INR 2.02 1.88   Hepatitis Panel No results for input(s): HEPBSAG, HCVAB, HEPAIGM, HEPBIGM in  the last 72 hours.    Studies/Results: Dg Chest Port 1 View  Result Date: 06/05/2017 CLINICAL DATA:  Intubation EXAM: PORTABLE CHEST 1 VIEW COMPARISON:  06/05/2017 FINDINGS: Endotracheal tube is 6 cm above the carina. Right central line tip is in the SVC. NG tube has been placed into the stomach. Cardiomegaly with vascular congestion and interstitial opacities, likely interstitial edema. No real change. IMPRESSION: Endotracheal tube 6 cm above the carina. Continued cardiomegaly and probable interstitial edema. Electronically Signed   By: Rolm Baptise M.D.   On: 06/05/2017 07:46   Dg Chest Port 1 View  Result Date: 06/05/2017 CLINICAL DATA:  Central line placement. EXAM: PORTABLE CHEST 1 VIEW COMPARISON:  Yesterday 2248 hour FINDINGS: Small central line is been removed. Tip of the right central line projects over the proximal SVC. No pneumothorax. Unchanged heart size and mediastinal contours. Diffuse interstitial opacities are similar to prior exam. No large pleural effusion or confluent airspace disease. IMPRESSION: 1.  Tip of the right central line projects over the proximal SVC. No pneumothorax. 2. Unchanged cardiomegaly. Interstitial opacities likely pulmonary edema, unchanged. Electronically Signed   By: Jeb Levering M.D.   On: 06/05/2017 03:05   Dg Chest Port 1 View  Result Date: 05/30/2017 CLINICAL DATA:  Initial evaluation for central line placement. EXAM: PORTABLE CHEST 1 VIEW COMPARISON:  Prior radiograph from earlier the same day. FINDINGS: There has been interval placement of a left IJ approach centra venous catheter, tip overlying the confluence of the left internal jugular vein/ brachiocephalic vein. Stable cardiomegaly. Mediastinal silhouette normal. Lungs hypoinflated. Chronic interstitial lung changes again noted. Increased pulmonary vascular congestion with interstitial prominence as compared to previous, which may reflect developing pulmonary interstitial edema. No definite pleural effusion. No focal infiltrates. No pneumothorax. Osseous structures unchanged. IMPRESSION: 1. Interval placement of left IJ approach centra venous catheter with tip overlying the confluence of the left internal jugular vein/brachiocephalic vein. 2. Increased vascular congestion with interstitial prominence, suggesting developing pulmonary interstitial edema. Electronically Signed   By: Jeannine Boga M.D.   On: 05/14/2017 23:27   Dg Chest Port 1 View  Result Date: 05/10/2017 CLINICAL DATA:  [place].  Weakness. EXAM: PORTABLE CHEST 1 VIEW COMPARISON:  04/03/2017. FINDINGS: Interval patient rotation to the right. Decreased inspiration. Interval mildly enlarged cardiac silhouette, magnified by the poor inspiration and portable AP technique. The interstitial markings remain prominent with no Kerley lines seen. No pleural fluid. Normal vascularity. Unremarkable bones. IMPRESSION: Poor inspiration with borderline cardiomegaly and chronic interstitial lung disease. Electronically Signed   By: Claudie Revering M.D.   On: 05/28/2017  14:50   Dg Abd Portable 1v  Result Date: 06/05/2017 CLINICAL DATA:  OG tube placement EXAM: PORTABLE ABDOMEN - 1 VIEW COMPARISON:  None. FINDINGS: Enteric tube tip projects in the right upper quadrant, likely in the distal stomach. Nonobstructive bowel gas pattern. IMPRESSION: Enteric tube tip in the distal stomach. Electronically Signed   By: Rolm Baptise M.D.   On: 06/05/2017 07:46     Tye Savoy, NP-C @  06/05/2017, 2:15 PM  Pager number [phone]

## 2017-06-05 NOTE — Procedures (Signed)
Central Venous Catheter Insertion Procedure Note CLEMMIE BUELNA [id number] 03/01/1956  Procedure: Insertion of Central Venous Catheter Indications: Assessment of intravascular volume, Drug and/or fluid administration and Frequent blood sampling  Procedure Details Consent: Risks of procedure as well as the alternatives and risks of each were explained to the (patient/caregiver).  Consent for procedure obtained. Time Out: Verified patient identification, verified procedure, site/side was marked, verified correct patient position, special equipment/implants available, medications/allergies/relevent history reviewed, required imaging and test results available.  Performed  Maximum sterile technique was used including antiseptics, cap, gloves, gown, hand hygiene, mask and sheet. Skin prep: Chlorhexidine; local anesthetic administered A antimicrobial bonded/coated triple lumen catheter was placed in the right internal jugular vein using the Seldinger technique.  Evaluation Blood flow good Complications: No apparent complications Patient did tolerate procedure well. Chest X-ray ordered to verify placement.  CXR: pending.  Hayden Pedro, AGACNP-BC [facility]  Pgr: [phone]  PCCM Pgr: [phone]

## 2017-06-05 NOTE — Progress Notes (Signed)
eLink Physician-Brief Progress Note Patient Name: Leah Shah DOB: Nov 23, 1955 MRN: [id number]   Date of Service  06/05/2017  HPI/Events of Note  Lactic Acid 1.8 --> 2.1. Not sure of significance is setting of NASH, cirrhosis and Total Bilirubin = 8.2. However, will volume expand with 25% albumin.  eICU Interventions  Will order: 1. 25% Albumin 12.5 gm IV now.      Intervention Category Major Interventions: Acid-Base disturbance - evaluation and management  Sommer,Steven Eugene 06/05/2017, 10:45 PM

## 2017-06-05 NOTE — Progress Notes (Signed)
Initial Nutrition Assessment  DOCUMENTATION CODES:   Morbid obesity  INTERVENTION:  - If TF warranted/desired, recommend Vital High Protein @ 10 mL/hr with 60 mL Prostat TID. This regimen + kcal from current Propofol and IVF rates will provide 1567 kcal, 111 grams of protein (87% estimated protein need), and 201 mL free water. - Will monitor for changes to Propofol and IVF.   NUTRITION DIAGNOSIS:   Inadequate oral intake related to inability to eat as evidenced by NPO status.  GOAL:   Provide needs based on ASPEN/SCCM guidelines  MONITOR:   Vent status, Weight trends, Labs, I & O's  REASON FOR ASSESSMENT:   Ventilator  ASSESSMENT:   61 y.o. female with PMH as outlined below including cirrhosis, chronic hepatitis, portal hypertension, recently diagnosed Wilson's disease (mildly elevated ceruloplasmin, 24 hr urinary copper, liver biopsy copper via transjugular liver biopsy 05/07/17, NO [employer] rings on ophthalmology eval).  She is followed by Dr. Marius Ditch at [facility] and she is apparently being evaluated for liver transplant at [facility].  BMI indicates morbid obesity. Noted weight +41 lbs/18.6 kg since 05/20/17; at that weight BMI still indicates morbid obesity. Pt was intubated for respiratory insufficiency at ~7 AM today. OGT in place. GI has been consulted. Mother and two other female family members at bedside and provide all information. Pt had been encouraged to lose 25 lbs in preparation for transplant. Family unsure if this goal in addition to mom's frequent reminders of this led to pt only eating 1 meal per day. In the past week with not feeling well pt was mainly eating a snack (such as a peanut butter and banana sandwich) once/day. Pt had complained of increased lethargy and nausea (which is unusual for her) for several days PTA.   Patient is currently intubated on ventilator support MV: 9.8 L/min Temp (24hrs), Avg:97.6 F (36.4 C), Min:95.7 F (35.4 C),  Max:98.7 F (37.1 C) Propofol: 8.6 ml/hr (227 kcal) BP: 98/55 and MAP: 69  Medications reviewed; 1 g IV Ca gluconate x1 run yesterday and x1 run today, 50 mg Solu-cortef QID, 10 units Novolog x1 dose this AM, 300 mL rectal lactulose BID, 30 g Kayexalate x1 dose this AM.  Labs reviewed; CBGs: 121, 108, and 129 mg/dL today, K: 5.4 mmol/L, BUN: 29 mg/dL, creatinine: 1.75 mg/dL, Ca: 8.2 mg/dL, GFR: 30 mL/min.   IVF: D5-150 mEq sodium bicarb @ 125 mL/hr (510 kcal).  Drips: Neo @ 300 mcg/min, Propofol @ 10 mcg/kg/min.     NUTRITION - FOCUSED PHYSICAL EXAM:  Completed; no muscle or fat wasting noted but pt with moderate to severe edema to all extremities (may be masking muscle or fat losses).  Diet Order:  Diet NPO time specified  EDUCATION NEEDS:   Not appropriate for education at this time  Skin:  Skin Assessment: Reviewed RN Assessment  Last BM:  12/1  Height:   Ht Readings from Last 1 Encounters:  05/28/2017 5\' 8"  (1.727 m)    Weight:   Wt Readings from Last 1 Encounters:  06/05/17 (!) 319 lb 0.1 oz (144.7 kg)    Ideal Body Weight:  63.64 kg  BMI:  Body mass index is 48.5 kg/m.  Estimated Nutritional Needs:   Kcal:  1400-1590 (22-25 kcal/kg IBW)  Protein:  >/= 127 grams (2 grams/kg IBW)  Fluid:  >/= 1.5 L/day     Jarome Matin, MS, RD, LDN, [facility] Inpatient Clinical Dietitian Pager # [phone] After hours/weekend pager # [phone]

## 2017-06-05 NOTE — Progress Notes (Signed)
  Echocardiogram 2D Echocardiogram has been performed.  Leah Shah 06/05/2017, 10:53 AM

## 2017-06-05 NOTE — Progress Notes (Signed)
PCCM Interval Note  Patient remains hyperkalemic (decreased from 7.5 to 6.4), creatine improving (2.75-2.09), patient with good urinary output.   Will give additional dose of Kayexalate, one gram of Ca gluconate, and 10 units IV insulin with dextrose. Repeat BMP order for after Ca gluconate finished infusing.   Hayden Pedro, AGACNP-BC [facility]  Pgr: [phone]  PCCM Pgr: [phone]

## 2017-06-05 NOTE — Progress Notes (Signed)
PULMONARY / CRITICAL CARE MEDICINE   Name: Leah Shah MRN: [id number] DOB: 1955/09/05    ADMISSION DATE:  05/06/2017 CONSULTATION DATE: 05/15/2017   REFERRING MD:  Eulis Foster  CHIEF COMPLAINT:  AMS  HISTORY OF PRESENT ILLNESS:  Pt is encephelopathic; therefore, this HPI is obtained from chart review. Leah Shah is a 61 y.o. female with PMH as outlined below including cirrhosis, chronic hepatitis, portal hypertension, recently diagnosed Wilson's disease (mildly elevated ceruloplasmin, 24 hr urinary copper, liver biopsy copper via transjugular liver biopsy 05/07/17, NO Beatriz Chancellor rings on ophthalmology eval).  She is followed by Dr. Marius Ditch at [facility] and she is apparently being evaluated for liver transplant at [facility].  She presented to [facility] ED 11/30 with altered mental status.  She has apparently been confused for the past several days per her daughter-in-law.  Daughter-in-law and additional family had been trying to convince her to move in with family so that they could help her with daily activities and to ensure that she would take her medications as prescribed.  Patient has apparently not been good about taking her medicines as prescribed and misses several doses on a daily basis.  Due to this, she was supposed to move in with daughter-in-law on 11/30.  When daughter-in-law called her that morning, there was no answer despite several calls.  Daughter-in-law then went to patient's home and found her on the floor sitting next to the chair.  Patient had told her that she apparently had a fall, was able to tell that she did not hit her head or lose consciousness. She had not had any recent complaints and had reportedly been in her usual state of health besides for confusion (felt to be due to missing lactulose), and decreased PO intake.  In ED, she was found to be encephalopathic, hypotensive, hypothermic.  Potassium was greater than 7.5 (she received temporizing measures) , ammonia  163, lactate 4.13 (repeat 4.82 > 1.91).  There was a delay in obtaining IV access therefore at the time of our evaluation, patient had only received 2 L of IV fluids.  She has an additional 2 L pending.  She is being covered with broad-spectrum antibiotics including vancomycin and Zosyn.  She has been started on Neo-Synephrine due to MAP in the 50s.  I had extensive discussion with patient's daughter-in-law and sister.  They state that they desire full CODE STATUS for now.  If patient is intubated and we are not making progress after a few days of supportive care; they are willing to have further discussions regarding CODE STATUS and goals of care.  Of note, she has had extensive workup of her liver disease including normal  iron & ferritin, Hepatitis total A Ab positive, Hepatitis B surface Ag negative, Hepatitis B surface Ab mildly positive,& Hepatitis C Ab negative,HIV negative, ANA negative, AMA & anti-SMA negative, LKM ab negative, Immunoglobulins normal, A-1 antitrypsin normal, celiac serology negative.  As mentioned above, she recently had mildly elevated ceruloplasmin, 24 hr urinary copper, liver biopsy copper via transjugular liver biopsy 05/07/17, NO [employer] Fleischer rings on ophthalmology eval.  PAST MEDICAL HISTORY :  She  has a past medical history of Anxiety, Arthritis, Chronic diastolic CHF (congestive heart failure) ([place]), Cirrhosis ([place]), Dysrhythmia, Headache(784.0), History of stress test, Hyperlipidemia, Hypertension, Moderate mitral regurgitation, Morbid obesity ([place]), Osteoarthritis, and Recurrent upper respiratory infection (URI).  PAST SURGICAL HISTORY: She  has a past surgical history that includes Abdominal hysterectomy; TMJ Arthroplasty; Heel spur surgery; Knee arthroscopy; Coccyx removal;  Eye surgery; Cholecystectomy; Knee Arthroplasty (07/28/2011); I&D extremity (08/27/2011); Joint replacement (Right, 2012); Colonoscopy with propofol (N/A, 03/11/2017); Esophagogastroduodenoscopy  (egd) with propofol (N/A, 03/11/2017); and IR Transcatheter BX (05/07/2017).  Allergies  Allergen Reactions  . Bacitracin-Neomycin-Polymyxin Other (See Comments)  . Codeine Other (See Comments)    Itching "she can take some forms of it" Itching "she can take some forms of it"  . Gabapentin Nausea Only  . Neosporin  [Neomycin-Bacitracin Zn-Polymyx]   . Pregabalin Other (See Comments)    stomach issues stomach issues    No current facility-administered medications on file prior to encounter.    Current Outpatient Medications on File Prior to Encounter  Medication Sig  . albuterol (PROVENTIL HFA;VENTOLIN HFA) 108 (90 Base) MCG/ACT inhaler Inhale 2 puffs into the lungs every 6 (six) hours as needed for wheezing or shortness of breath.  . alprazolam (XANAX) 2 MG tablet Take 1 tablet (2 mg total) 2 (two) times daily as needed by mouth for anxiety.  [name] aspirin 81 MG EC tablet Take 1 tablet (81 mg total) by mouth daily.  [name] atenolol (TENORMIN) 100 MG tablet TAKE 1 TABLET (100 MG TOTAL) BY MOUTH DAILY.  . citalopram (CELEXA) 10 MG tablet Take 1 tablet (10 mg total) by mouth daily.  . cyclobenzaprine (FLEXERIL) 5 MG tablet TAKE ONE AND ONE-HALF TABLETS BY MOUTH EVERY NIGHT AT BEDTIME  . furosemide (LASIX) 40 MG tablet Take 40 mg by mouth daily.  . hydrOXYzine (ATARAX/VISTARIL) 25 MG tablet Take 1 tablet (25 mg total) every 8 (eight) hours as needed by mouth.  . lactulose (CHRONULAC) 10 GM/15ML solution Take 15 mLs (10 g total) by mouth 2 (two) times daily. (Patient taking differently: Take 10 g by mouth 3 (three) times daily. )  . magnesium oxide (MAG-OX) 400 (241.3 Mg) MG tablet Take 1 tablet 2 (two) times daily by mouth.  [name] omeprazole (PRILOSEC) 40 MG capsule Take 1 capsule (40 mg total) by mouth 2 (two) times daily before a meal.  . oxyCODONE (OXY IR/ROXICODONE) 5 MG immediate release tablet Take 1 tablet (5 mg total) every 8 (eight) hours as needed by mouth for severe pain.  . potassium chloride  SA (K-DUR,KLOR-CON) 20 MEQ tablet Take 20 mEq by mouth 2 (two) times daily.  [name] spironolactone (ALDACTONE) 100 MG tablet Take 1 tablet (100 mg total) by mouth daily.  . traZODone (DESYREL) 150 MG tablet Take 1 tablet by mouth daily as needed.  . Vitamin D, Ergocalciferol, (DRISDOL) 50000 units CAPS capsule Take 1 capsule (50,000 Units total) by mouth every 7 (seven) days.  . cephALEXin (KEFLEX) 500 MG capsule Take 1 capsule (500 mg total) by mouth every 12 (twelve) hours. (Patient not taking: Reported on 05/20/2017)  . fluconazole (DIFLUCAN) 200 MG tablet Take 200 mg daily by mouth.  . Magnesium Oxide 400 (240 Mg) MG TABS TAKE 1 TABLET BY MOUTH TWICE A DAY (Patient not taking: Reported on 05/21/2017)  . polyethylene glycol (MIRALAX / GLYCOLAX) packet Take 17 g by mouth daily as needed for mild constipation. (Patient not taking: Reported on 06/03/2017)  . rifaximin (XIFAXAN) 550 MG TABS tablet Take 1 tablet (550 mg total) 2 (two) times daily by mouth. (Patient not taking: Reported on 05/17/2017)    FAMILY HISTORY:  Her indicated that her mother is deceased. She indicated that her father is deceased. She indicated that her sister is alive. She indicated that her maternal uncle is deceased.   SOCIAL HISTORY: She  reports that she quit smoking about  2 months ago. Her smoking use included cigarettes. She has a 12.50 pack-year smoking history. she has never used smokeless tobacco. She reports that she does not drink alcohol or use drugs.  REVIEW OF SYSTEMS:  Unable to obtain as pt is encephalopathic.  SUBJECTIVE:   Intubated today AM for worsening resp failure. Continues on neo  VITAL SIGNS: BP (!) 109/50 (BP Location: Left Arm)   Pulse 82   Temp 98.3 F (36.8 C) (Oral)   Resp 13   Ht 5\' 8"  (1.727 m)   Wt (!) 319 lb 0.1 oz (144.7 kg)   SpO2 100%   BMI 48.50 kg/m   HEMODYNAMICS: CVP:  [13 mmHg-90 mmHg] 13 mmHg  VENTILATOR SETTINGS: Vent Mode: PRVC FiO2 (%):  [100 %] 100 % Set Rate:   [14 bmp] 14 bmp Vt Set:  [510 mL] 510 mL Plateau Pressure:  [18 cmH20] 18 cmH20  INTAKE / OUTPUT: I/O last 3 completed shifts: In: 3366.1 [I.V.:3086.1; Other:120; IV Piggyback:160] Out: 2250 [Urine:2250]   PHYSICAL EXAMINATION: Gen:      No acute distress, obese HEENT:  EOMI, sclera jaundiced Neck:     No masses; no thyromegaly Lungs:    Clear to auscultation bilaterally; normal respiratory effort CV:         Regular rate and rhythm; no murmurs Abd:      + bowel sounds; soft, non-tender; no palpable masses, no distension Ext:    3+ edema; adequate peripheral perfusion Skin:      Warm and dry; no rash Neuro: Sedated, unresponsive  LABS:  BMET Recent Labs  Lab 06/03/2017 2250 06/05/17 0000 06/05/17 0527  NA 134* 134* 137  K 6.2* 6.4* 5.5*  CL 110 111 110  CO2 19* 19* 19*  BUN 36* 38* 33*  CREATININE 2.15* 2.09* 1.96*  GLUCOSE 130* 121* 96    Electrolytes Recent Labs  Lab 05/17/2017 2250 06/05/17 0000 06/05/17 0527  CALCIUM 7.9* 8.0* 8.3*  MG  --   --  2.0  PHOS  --   --  4.8*    CBC Recent Labs  Lab 06/02/2017 1300 06/05/17 0527  WBC 12.2* 11.7*  HGB 10.0* 8.8*  HCT 27.6* 24.8*  PLT 114* 115*    Coag's Recent Labs  Lab 05/17/2017 1541 06/05/17 0527  INR 2.02 1.88    Sepsis Markers Recent Labs  Lab 05/13/2017 1726 05/07/2017 1958 06/05/17 0101 06/05/17 0527  LATICACIDVEN 1.91* 1.7 1.9  --   PROCALCITON  --  0.19  --  0.17    ABG Recent Labs  Lab 05/08/2017 1841 06/05/17 0635  PHART 7.432 7.359  PCO2ART 28.6* 37.4  PO2ART 112.0* 69.9*    Liver Enzymes Recent Labs  Lab 05/27/2017 1300  AST 59*  ALT 26  ALKPHOS 148*  BILITOT 7.6*  ALBUMIN 2.5*    Cardiac Enzymes Recent Labs  Lab 06/01/2017 1956 06/05/17 0029  TROPONINI 0.15* 0.20*    Glucose Recent Labs  Lab 05/23/2017 1704 05/24/2017 2019 06/05/17 0019 06/05/17 0402 06/05/17 0818  GLUCAP 82 113* 121* 108* 129*    Imaging Dg Chest Port 1 View  Result Date:  06/05/2017 CLINICAL DATA:  Intubation EXAM: PORTABLE CHEST 1 VIEW COMPARISON:  06/05/2017 FINDINGS: Endotracheal tube is 6 cm above the carina. Right central line tip is in the SVC. NG tube has been placed into the stomach. Cardiomegaly with vascular congestion and interstitial opacities, likely interstitial edema. No real change. IMPRESSION: Endotracheal tube 6 cm above the carina. Continued cardiomegaly and probable interstitial  edema. Electronically Signed   By: Rolm Baptise M.D.   On: 06/05/2017 07:46   Dg Chest Port 1 View  Result Date: 06/05/2017 CLINICAL DATA:  Central line placement. EXAM: PORTABLE CHEST 1 VIEW COMPARISON:  Yesterday 2248 hour FINDINGS: Small central line is been removed. Tip of the right central line projects over the proximal SVC. No pneumothorax. Unchanged heart size and mediastinal contours. Diffuse interstitial opacities are similar to prior exam. No large pleural effusion or confluent airspace disease. IMPRESSION: 1. Tip of the right central line projects over the proximal SVC. No pneumothorax. 2. Unchanged cardiomegaly. Interstitial opacities likely pulmonary edema, unchanged. Electronically Signed   By: Jeb Levering M.D.   On: 06/05/2017 03:05   Dg Chest Port 1 View  Result Date: 05/20/2017 CLINICAL DATA:  Initial evaluation for central line placement. EXAM: PORTABLE CHEST 1 VIEW COMPARISON:  Prior radiograph from earlier the same day. FINDINGS: There has been interval placement of a left IJ approach centra venous catheter, tip overlying the confluence of the left internal jugular vein/ brachiocephalic vein. Stable cardiomegaly. Mediastinal silhouette normal. Lungs hypoinflated. Chronic interstitial lung changes again noted. Increased pulmonary vascular congestion with interstitial prominence as compared to previous, which may reflect developing pulmonary interstitial edema. No definite pleural effusion. No focal infiltrates. No pneumothorax. Osseous structures  unchanged. IMPRESSION: 1. Interval placement of left IJ approach centra venous catheter with tip overlying the confluence of the left internal jugular vein/brachiocephalic vein. 2. Increased vascular congestion with interstitial prominence, suggesting developing pulmonary interstitial edema. Electronically Signed   By: Jeannine Boga M.D.   On: 05/28/2017 23:27   Dg Chest Port 1 View  Result Date: 06/01/2017 CLINICAL DATA:  [place].  Weakness. EXAM: PORTABLE CHEST 1 VIEW COMPARISON:  04/03/2017. FINDINGS: Interval patient rotation to the right. Decreased inspiration. Interval mildly enlarged cardiac silhouette, magnified by the poor inspiration and portable AP technique. The interstitial markings remain prominent with no Kerley lines seen. No pleural fluid. Normal vascularity. Unremarkable bones. IMPRESSION: Poor inspiration with borderline cardiomegaly and chronic interstitial lung disease. Electronically Signed   By: Claudie Revering M.D.   On: 05/31/2017 14:50   Dg Abd Portable 1v  Result Date: 06/05/2017 CLINICAL DATA:  OG tube placement EXAM: PORTABLE ABDOMEN - 1 VIEW COMPARISON:  None. FINDINGS: Enteric tube tip projects in the right upper quadrant, likely in the distal stomach. Nonobstructive bowel gas pattern. IMPRESSION: Enteric tube tip in the distal stomach. Electronically Signed   By: Rolm Baptise M.D.   On: 06/05/2017 07:46    STUDIES:  CXR 11/30 > chronic interstitial disease. CXR 12/1> ETT in place, No acute changes  CULTURES: Blood 11/30 >  Urine 11/30 >   ANTIBIOTICS: Vanc 11/30 >  Zosyn 11/30 >   SIGNIFICANT EVENTS: 11/30 > admit.  LINES/TUBES:  ETT 12/1 >  Radial a line pending 11/30 >  DISCUSSION: 61 y.o. female with PMH including cirrhosis, chronic hepatitis, portal hypertension, possible Wilson's disease (mildly elevated ceruloplasmin, 24 hr urinary copper, liver biopsy copper via transjugular liver biopsy 05/07/17, NO [employer] Fle[employer] rings on ophthalmology  eval).  She is followed by Dr. Marius Ditch at [facility] and she is apparently being evaluated for liver transplant at [facility] She presented [facility] 11/30 with acute encephalopathy and sepsis of unclear etiology.  She was subsequently admitted to the ICU for further eval and management.  ASSESSMENT / PLAN: PULMONARY A: Acute resp failure P:   Intubated Continue vent support Check ABG, CXR  CARDIOVASCULAR A:  Sepsis - of  unclear etiology, possible peritonitis in setting ascites / cirrhosis.  A repeat sepsis assessment has been performed. Troponin bump - due to above. Hx HTN, HLD, chronic dCHF (echo from Feb 2018 with EF 22-02%, LV diastolic dysfunction, mild MR, mod LA dilation). P:  CVL placement. Continue neo, wean down pressors Follow troponin Follow echocardiogram Continue preadmission ASA. Hold preadmission atenolol, furosemide, spironolactone.  RENAL A:   Hyponatremia - presumed due to hypovolemia, cirrhosis. Hyperkalemia - s/p temporizing measures in ED.  Repeat labs with persistent hyperkalemia. NAGMA. AKI. P:   Continue HCO3 gtt. Follow BMP. May need nephrology consult  GASTROINTESTINAL A:   Hx cirrhosis, chronic hepatitis, recent dx Wilson's disease (followed by Dr. Marius Ditch at [facility]) - reportedly being worked up for transplant at [facility]. Hyperbilirubinemia - chronic, due to above. ? Peritonitis - in setting ascites. Morbid obesity. Nutrition. P:   Continue empiric abx. Abd ultrasound to assess ascites.  Keep NPO GI consutled  HEMATOLOGIC A:   Anemia - chronic. Thrombocytopenia - chronic. VTE Prophylaxis. P:  Transfuse for Hgb < 7. SCD's only for now. CBC in AM.  INFECTIOUS A:   Sepsis - of unclear etiology, possible peritonitis in setting ascites / cirrhosis.  A repeat sepsis assessment has been performed. P:   Continue vanco, zosyn Follow cultures as above. PCT algorithm to limit abx exposure.  ENDOCRINE A: No acute issues. P: No  interventions required.  NEUROLOGIC A:   Acute encephalopathy - presumed multifactorial in setting hepatic encephalopathy + sepsis. Hx headache, anxiety. P:   Continue lactulose, rifamixin Supportive care Avoid sedating meds. Hold preadmission alprazolam, citalopram, cyclobenzaprine, hydroxyzine, oxy IR, trazodone.  Family updated: Daughter-in-law and sister updated extensively in ED conference room.  Updated on pt's condition and plan moving forward.  Family desires FULL CODE status for now, but only on a short term basis.  If pt were to decline and ICU team felt that she had poor prognosis for meaningful recovery, family would be willing to have further discussions regarding goals of care / code status.  Interdisciplinary Family Meeting v Palliative Care Meeting:  Due by: 06/10/17.  The patient is critically ill with multiple organ system failure and requires high complexity decision making for assessment and support, frequent evaluation and titration of therapies, advanced monitoring, review of radiographic studies and interpretation of complex data.   Critical Care Time devoted to patient care services, exclusive of separately billable procedures, described in this note is 35 minutes.   Marshell Garfinkel MD [facility] Pager [phone] If no answer or after 3pm call: [phone] 06/05/2017, 9:12 AM

## 2017-06-05 DEATH — deceased

## 2017-06-06 ENCOUNTER — Inpatient Hospital Stay (HOSPITAL_COMMUNITY): Payer: Medicare HMO

## 2017-06-06 DIAGNOSIS — D509 Iron deficiency anemia, unspecified: Secondary | ICD-10-CM

## 2017-06-06 DIAGNOSIS — K7290 Hepatic failure, unspecified without coma: Secondary | ICD-10-CM

## 2017-06-06 LAB — BASIC METABOLIC PANEL
ANION GAP: 7 (ref 5–15)
BUN: 25 mg/dL — ABNORMAL HIGH (ref 6–20)
CO2: 23 mmol/L (ref 22–32)
Calcium: 8.6 mg/dL — ABNORMAL LOW (ref 8.9–10.3)
Chloride: 111 mmol/L (ref 101–111)
Creatinine, Ser: 1.37 mg/dL — ABNORMAL HIGH (ref 0.44–1.00)
GFR, EST AFRICAN AMERICAN: 47 mL/min — AB (ref >60)
GFR, EST NON AFRICAN AMERICAN: 40 mL/min — AB (ref >60)
GLUCOSE: 162 mg/dL — AB (ref 65–99)
POTASSIUM: 4.2 mmol/L (ref 3.5–5.1)
Sodium: 141 mmol/L (ref 135–145)

## 2017-06-06 LAB — CBC
HEMATOCRIT: 22.1 % — AB (ref 36.0–46.0)
HEMATOCRIT: 19.2 % — AB (ref 36.0–46.0)
HEMOGLOBIN: 7.7 g/dL — AB (ref 12.0–15.0)
Hemoglobin: 6.8 g/dL — CL (ref 12.0–15.0)
MCH: 41.8 pg — AB (ref 26.0–34.0)
MCH: 42.5 pg — ABNORMAL HIGH (ref 26.0–34.0)
MCHC: 34.8 g/dL (ref 30.0–36.0)
MCHC: 35.4 g/dL (ref 30.0–36.0)
MCV: 120.1 fL — ABNORMAL HIGH (ref 78.0–100.0)
MCV: 120 fL — ABNORMAL HIGH (ref 78.0–100.0)
PLATELETS: 59 10*3/uL — AB (ref 150–400)
Platelets: 75 10*3/uL — ABNORMAL LOW (ref 150–400)
RBC: 1.84 MIL/uL — AB (ref 3.87–5.11)
RBC: 1.6 MIL/uL — ABNORMAL LOW (ref 3.87–5.11)
RDW: 19.3 % — ABNORMAL HIGH (ref 11.5–15.5)
RDW: 19 % — AB (ref 11.5–15.5)
WBC: 9.4 10*3/uL (ref 4.0–10.5)
WBC: 6.4 10*3/uL (ref 4.0–10.5)

## 2017-06-06 LAB — MAGNESIUM: Magnesium: 2 mg/dL (ref 1.7–2.4)

## 2017-06-06 LAB — GLUCOSE, CAPILLARY
Glucose-Capillary: 138 mg/dL — ABNORMAL HIGH (ref 65–99)
Glucose-Capillary: 139 mg/dL — ABNORMAL HIGH (ref 65–99)
Glucose-Capillary: 144 mg/dL — ABNORMAL HIGH (ref 65–99)
Glucose-Capillary: 146 mg/dL — ABNORMAL HIGH (ref 65–99)
Glucose-Capillary: 155 mg/dL — ABNORMAL HIGH (ref 65–99)
Glucose-Capillary: 156 mg/dL — ABNORMAL HIGH (ref 65–99)
Glucose-Capillary: 172 mg/dL — ABNORMAL HIGH (ref 65–99)
Glucose-Capillary: 175 mg/dL — ABNORMAL HIGH (ref 65–99)

## 2017-06-06 LAB — AMMONIA: Ammonia: 48 umol/L — ABNORMAL HIGH (ref 9–35)

## 2017-06-06 LAB — PHOSPHORUS: PHOSPHORUS: 4.5 mg/dL (ref 2.5–4.6)

## 2017-06-06 LAB — PROCALCITONIN: Procalcitonin: 0.13 ng/mL

## 2017-06-06 LAB — PREPARE RBC (CROSSMATCH)

## 2017-06-06 MED ORDER — PANTOPRAZOLE SODIUM 40 MG PO PACK
40.0000 mg | PACK | Freq: Every day | ORAL | Status: DC
Start: 2017-06-06 — End: 2017-06-07
  Administered 2017-06-06: 40 mg
  Filled 2017-06-06 (×2): qty 20

## 2017-06-06 MED ORDER — ORAL CARE MOUTH RINSE
15.0000 mL | Freq: Four times a day (QID) | OROMUCOSAL | Status: DC
  Administered 2017-06-06 – 2017-06-07 (×7): 15 mL via OROMUCOSAL

## 2017-06-06 MED ORDER — CHLORHEXIDINE GLUCONATE 0.12% ORAL RINSE (MEDLINE KIT)
15.0000 mL | Freq: Two times a day (BID) | OROMUCOSAL | Status: DC
  Administered 2017-06-06 – 2017-06-07 (×3): 15 mL via OROMUCOSAL

## 2017-06-06 MED ORDER — ASPIRIN 81 MG PO CHEW
81.0000 mg | CHEWABLE_TABLET | Freq: Every day | ORAL | Status: DC
  Administered 2017-06-06 – 2017-06-07 (×2): 81 mg
  Filled 2017-06-06: qty 1

## 2017-06-06 MED ORDER — SODIUM CHLORIDE 0.9 % IV SOLN
Freq: Once | INTRAVENOUS | Status: AC
  Administered 2017-06-06: 17:00:00 via INTRAVENOUS

## 2017-06-06 MED ORDER — IOPAMIDOL (ISOVUE-300) INJECTION 61%
INTRAVENOUS | Status: AC
  Administered 2017-06-06: 12:00:00
  Filled 2017-06-06: qty 30

## 2017-06-06 NOTE — Progress Notes (Signed)
Dr. Vaughan Browner informed of pts hgb of 6.8. See orders.

## 2017-06-06 NOTE — Progress Notes (Addendum)
CSW received consult for pt's sister stating that she has healthcare power of attorney. CSW advised RN that sister will have to bring in paperwork and husband will have to agree with healthcare power of attorney to be changed. Paper work will have to be notarized with new changes. Patient is currently intubated. Unit CSW will continue to follow in case needs change.  Leah Shah, [place]

## 2017-06-06 NOTE — Progress Notes (Signed)
[facility] Progress Note  Chief Complaint:    Cirrhosis, sepsis  Subjective: Awake but still intubated. Pressor stopped. Denies chest pain, abdominal pain.  Objective:  Vital signs in last 24 hours: Temp:  [98 F (36.7 C)-98.9 F (37.2 C)] 98.9 F (37.2 C) (12/02 1696) Pulse Rate:  [64-100] 76 (12/02 1000) Resp:  [10-23] 12 (12/02 1000) BP: (93-134)/(42-91) 99/44 (12/02 1000) SpO2:  [94 %-100 %] 100 % (12/02 1000) Arterial Line BP: (66-124)/(40-72) 124/72 (12/01 1545) FiO2 (%):  [40 %] 40 % (12/02 0722) Weight:  [319 lb 10.7 oz (145 kg)] 319 lb 10.7 oz (145 kg) (12/02 0444) Last BM Date: 06/06/17  General:  Obese white female lying in bed awake, intubated.  Heart:  Regular rate and rhythm; no murmurs, pitting.lower extremity edema Pulm: intubated on vent. Chest clear to auscultation.  Abdomen:  Soft, nondistended, nontender.  Hypoactive bowel sounds Neurologic:  Alert makes eye contact, shakes head to in response to questions   Intake/Output from previous day: 12/01 0701 - 12/02 0700 In: 4096.4 [I.V.:3236.4; NG/GT:60; IV Piggyback:800] Out: 5070 [Urine:1870; Stool:3200] Intake/Output this shift: Total I/O In: 28.8 [I.V.:28.8] Out: 200 [Urine:200]  Lab Results: Recent Labs    06/05/17 0527 06/05/17 1609 06/06/17 0451  WBC 11.7* 14.4* 9.4  HGB 8.8* 9.3* 7.7*  HCT 24.8* 26.3* 22.1*  PLT 115* 115* 75*   BMET Recent Labs    06/05/17 0910 06/05/17 1609 06/06/17 0451  NA 136 135 141  K 5.4* 4.9 4.2  CL 109 106 111  CO2 22 22 23   GLUCOSE 145* 208* 162*  BUN 29* 28* 25*  CREATININE 1.75* 1.59* 1.37*  CALCIUM 8.2* 8.0* 8.6*   LFT Recent Labs    06/05/17 1609  PROT 5.8*  ALBUMIN 2.3*  AST 47*  ALT 25  ALKPHOS 131*  BILITOT 8.2*   PT/INR Recent Labs    05/30/2017 1541 06/05/17 0527  LABPROT 22.7* 21.4*  INR 2.02 1.88    US Abdomen Complete  Result Date: 06/05/2017 CLINICAL DATA:  History of cirrhosis. EXAM: ABDOMEN  ULTRASOUND COMPLETE COMPARISON:  CT scan April 03, 2017 FINDINGS: Gallbladder: The gallbladder has been surgically removed Common bile duct: Diameter: 9 mm Liver: The liver demonstrates a nodular contour with heterogeneous and increased echotexture. No focal mass. There is reversal blood flow in the portal vein. IVC: The intrahepatic IVC is not visualized. Pancreas: Not visualized. Spleen: Splenomegaly with a volume of 695 cc and a length of 13.9 cm. Right Kidney: Length: 11.1 cm. Echogenicity within normal limits. No mass or hydronephrosis visualized. Left Kidney:  Not visualized. Abdominal aorta: No aneurysm visualized. Other findings: Only the proximal aorta was identified and unremarkable. IMPRESSION: 1. Signs of cirrhosis with a nodular liver. No focal liver mass. Reversal of blood flow in the portal vein consistent with portal venous hypertension. 2. The common bile duct is prominent, possibly due to previous cholecystectomy. Recommend correlation with labs. 3. Splenomegaly. 4. The infrahepatic IVC, pancreas, left kidney, and distal half of the aorta were not visualized. Electronically Signed   By: Dorise Bullion III M.D   On: 06/05/2017 17:59   Dg Chest Port 1 View  Result Date: 06/05/2017 CLINICAL DATA:  Intubation EXAM: PORTABLE CHEST 1 VIEW COMPARISON:  06/05/2017 FINDINGS: Endotracheal tube is 6 cm above the carina. Right central line tip is in the SVC. NG tube has been placed into the stomach. Cardiomegaly with vascular congestion and interstitial opacities, likely interstitial edema. No real change. IMPRESSION: Endotracheal tube  6 cm above the carina. Continued cardiomegaly and probable interstitial edema. Electronically Signed   By: Rolm Baptise M.D.   On: 06/05/2017 07:46   Dg Chest Port 1 View  Result Date: 06/05/2017 CLINICAL DATA:  Central line placement. EXAM: PORTABLE CHEST 1 VIEW COMPARISON:  Yesterday 2248 hour FINDINGS: Small central line is been removed. Tip of the right central  line projects over the proximal SVC. No pneumothorax. Unchanged heart size and mediastinal contours. Diffuse interstitial opacities are similar to prior exam. No large pleural effusion or confluent airspace disease. IMPRESSION: 1. Tip of the right central line projects over the proximal SVC. No pneumothorax. 2. Unchanged cardiomegaly. Interstitial opacities likely pulmonary edema, unchanged. Electronically Signed   By: Jeb Levering M.D.   On: 06/05/2017 03:05   Dg Chest Port 1 View  Result Date: 05/29/2017 CLINICAL DATA:  Initial evaluation for central line placement. EXAM: PORTABLE CHEST 1 VIEW COMPARISON:  Prior radiograph from earlier the same day. FINDINGS: There has been interval placement of a left IJ approach centra venous catheter, tip overlying the confluence of the left internal jugular vein/ brachiocephalic vein. Stable cardiomegaly. Mediastinal silhouette normal. Lungs hypoinflated. Chronic interstitial lung changes again noted. Increased pulmonary vascular congestion with interstitial prominence as compared to previous, which may reflect developing pulmonary interstitial edema. No definite pleural effusion. No focal infiltrates. No pneumothorax. Osseous structures unchanged. IMPRESSION: 1. Interval placement of left IJ approach centra venous catheter with tip overlying the confluence of the left internal jugular vein/brachiocephalic vein. 2. Increased vascular congestion with interstitial prominence, suggesting developing pulmonary interstitial edema. Electronically Signed   By: Jeannine Boga M.D.   On: 05/14/2017 23:27   Dg Chest Port 1 View  Result Date: 05/30/2017 CLINICAL DATA:  [place].  Weakness. EXAM: PORTABLE CHEST 1 VIEW COMPARISON:  04/03/2017. FINDINGS: Interval patient rotation to the right. Decreased inspiration. Interval mildly enlarged cardiac silhouette, magnified by the poor inspiration and portable AP technique. The interstitial markings remain prominent with no  Kerley lines seen. No pleural fluid. Normal vascularity. Unremarkable bones. IMPRESSION: Poor inspiration with borderline cardiomegaly and chronic interstitial lung disease. Electronically Signed   By: Claudie Revering M.D.   On: 05/29/2017 14:50   Dg Abd Portable 1v  Result Date: 06/05/2017 CLINICAL DATA:  OG tube placement EXAM: PORTABLE ABDOMEN - 1 VIEW COMPARISON:  None. FINDINGS: Enteric tube tip projects in the right upper quadrant, likely in the distal stomach. Nonobstructive bowel gas pattern. IMPRESSION: Enteric tube tip in the distal stomach. Electronically Signed   By: Rolm Baptise M.D.   On: 06/05/2017 07:46    ASSESSMENT / PLAN:   1. Cirrhosis with portal HTN. 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 Patient encephalopathic with elevated ammonia level on admission but sepsis also probably contributing to altered mental status.  -Etiology of sepsis still undetermined. Abdominal u/s done yesterday - no ascites seen making SBP unlikely.  -still intubated on vent but awake now. Shakes head in response to questions. Hands in soft restraints. Ammonia 48. Continue lactulose and xifaxan  2. Anemia, microcytic. Hg 7.3, down overnight from 9.3. Liquid dark brown stool in collection bag does not appear melenic. Getting daily PPI  3. AKI, improving  Active Problems:   Sepsis ([place])   Acute respiratory failure with hypoxia ([place])   LOS: 2 days   Tye Savoy ,NP 06/06/2017, 10:20 AM Pager number [phone]     Attending physician's note   I have taken an interval  history, reviewed the chart and examined the patient. I agree with the Advanced Practitioner's note, impression and recommendations.   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 MS substantially improved.  PSE substantially improved.  Continue lactulose and Xifaxan.  Worsening anemia without evidence of GI bleeding is likely due to hydration.  AKI  improving. Trend ammonia, CBC, CMP, PT/INR.   Lucio Edward, MD [facility] [phone] Mon-Fri 8a-5p [phone] after 5p, weekends, holidays

## 2017-06-06 NOTE — Progress Notes (Signed)
Pt transported to CTat this time by Rip Harbour, Therapist, sports, RT and Museum/gallery conservator.

## 2017-06-06 NOTE — Progress Notes (Addendum)
PULMONARY / CRITICAL CARE MEDICINE   Name: Leah Shah MRN: [id number] DOB: 06/25/1956    ADMISSION DATE:  05/26/2017 CONSULTATION DATE: 05/14/2017   REFERRING MD:  Eulis Foster  CHIEF COMPLAINT:  AMS  HISTORY OF PRESENT ILLNESS:  Pt is encephelopathic; therefore, this HPI is obtained from chart review. Leah Shah is a 61 y.o. female with PMH as outlined below including cirrhosis, chronic hepatitis, portal hypertension, recently diagnosed Wilson's disease (mildly elevated ceruloplasmin, 24 hr urinary copper, liver biopsy copper via transjugular liver biopsy 05/07/17, NO Beatriz Chancellor rings on ophthalmology eval).  She is followed by Dr. Marius Ditch at [facility] and she is apparently being evaluated for liver transplant at [facility].  She presented to [facility] ED 11/30 with altered mental status.  She has apparently been confused for the past several days per her daughter-in-law.  Daughter-in-law and additional family had been trying to convince her to move in with family so that they could help her with daily activities and to ensure that she would take her medications as prescribed.  Patient has apparently not been good about taking her medicines as prescribed and misses several doses on a daily basis.  Due to this, she was supposed to move in with daughter-in-law on 11/30.  When daughter-in-law called her that morning, there was no answer despite several calls.  Daughter-in-law then went to patient's home and found her on the floor sitting next to the chair.  Patient had told her that she apparently had a fall, was able to tell that she did not hit her head or lose consciousness. She had not had any recent complaints and had reportedly been in her usual state of health besides for confusion (felt to be due to missing lactulose), and decreased PO intake.  In ED, she was found to be encephalopathic, hypotensive, hypothermic.  Potassium was greater than 7.5 (she received temporizing measures) , ammonia  163, lactate 4.13 (repeat 4.82 > 1.91).  PCCM consulted for admission  Of note, she has had extensive workup of her liver disease including normal  iron & ferritin, Hepatitis total A Ab positive, Hepatitis B surface Ag negative, Hepatitis B surface Ab mildly positive,& Hepatitis C Ab negative,HIV negative, ANA negative, AMA & anti-SMA negative, LKM ab negative, Immunoglobulins normal, A-1 antitrypsin normal, celiac serology negative.  As mentioned above, she recently had mildly elevated ceruloplasmin, 24 hr urinary copper, liver biopsy copper via transjugular liver biopsy 05/07/17, NO [facility] rings on ophthalmology eval.  PAST MEDICAL HISTORY :  She  has a past medical history of Anxiety, Arthritis, Chronic diastolic CHF (congestive heart failure) ([place]), Cirrhosis ([employer]), Dysrhythmia, Headache(784.0), History of stress test, Hyperlipidemia, Hypertension, Moderate mitral regurgitation, Morbid obesity ([place]), Osteoarthritis, and Recurrent upper respiratory infection (URI).  PAST SURGICAL HISTORY: She  has a past surgical history that includes Abdominal hysterectomy; TMJ Arthroplasty; Heel spur surgery; Knee arthroscopy; Coccyx removal; Eye surgery; Cholecystectomy; Knee Arthroplasty (07/28/2011); I&D extremity (08/27/2011); Joint replacement (Right, 2012); Colonoscopy with propofol (N/A, 03/11/2017); Esophagogastroduodenoscopy (egd) with propofol (N/A, 03/11/2017); and IR Transcatheter BX (05/07/2017).  Allergies  Allergen Reactions  . Bacitracin-Neomycin-Polymyxin Other (See Comments)  . Codeine Other (See Comments)    Itching "she can take some forms of it" Itching "she can take some forms of it"  . Gabapentin Nausea Only  . Neosporin  [Neomycin-Bacitracin Zn-Polymyx]   . Pregabalin Other (See Comments)    stomach issues stomach issues    No current facility-administered medications on file prior to encounter.    Current  Outpatient Medications on File Prior to Encounter  Medication Sig  .  albuterol (PROVENTIL HFA;VENTOLIN HFA) 108 (90 Base) MCG/ACT inhaler Inhale 2 puffs into the lungs every 6 (six) hours as needed for wheezing or shortness of breath.  . alprazolam (XANAX) 2 MG tablet Take 1 tablet (2 mg total) 2 (two) times daily as needed by mouth for anxiety.  [name] aspirin 81 MG EC tablet Take 1 tablet (81 mg total) by mouth daily.  [name] atenolol (TENORMIN) 100 MG tablet TAKE 1 TABLET (100 MG TOTAL) BY MOUTH DAILY.  . citalopram (CELEXA) 10 MG tablet Take 1 tablet (10 mg total) by mouth daily.  . cyclobenzaprine (FLEXERIL) 5 MG tablet TAKE ONE AND ONE-HALF TABLETS BY MOUTH EVERY NIGHT AT BEDTIME  . furosemide (LASIX) 40 MG tablet Take 40 mg by mouth daily.  . hydrOXYzine (ATARAX/VISTARIL) 25 MG tablet Take 1 tablet (25 mg total) every 8 (eight) hours as needed by mouth.  . lactulose (CHRONULAC) 10 GM/15ML solution Take 15 mLs (10 g total) by mouth 2 (two) times daily. (Patient taking differently: Take 10 g by mouth 3 (three) times daily. )  . magnesium oxide (MAG-OX) 400 (241.3 Mg) MG tablet Take 1 tablet 2 (two) times daily by mouth.  [name] omeprazole (PRILOSEC) 40 MG capsule Take 1 capsule (40 mg total) by mouth 2 (two) times daily before a meal.  . oxyCODONE (OXY IR/ROXICODONE) 5 MG immediate release tablet Take 1 tablet (5 mg total) every 8 (eight) hours as needed by mouth for severe pain.  . potassium chloride SA (K-DUR,KLOR-CON) 20 MEQ tablet Take 20 mEq by mouth 2 (two) times daily.  [name] spironolactone (ALDACTONE) 100 MG tablet Take 1 tablet (100 mg total) by mouth daily.  . traZODone (DESYREL) 150 MG tablet Take 1 tablet by mouth daily as needed.  . Vitamin D, Ergocalciferol, (DRISDOL) 50000 units CAPS capsule Take 1 capsule (50,000 Units total) by mouth every 7 (seven) days.  . cephALEXin (KEFLEX) 500 MG capsule Take 1 capsule (500 mg total) by mouth every 12 (twelve) hours. (Patient not taking: Reported on 05/12/2017)  . fluconazole (DIFLUCAN) 200 MG tablet Take 200 mg daily by  mouth.  . Magnesium Oxide 400 (240 Mg) MG TABS TAKE 1 TABLET BY MOUTH TWICE A DAY (Patient not taking: Reported on 05/09/2017)  . polyethylene glycol (MIRALAX / GLYCOLAX) packet Take 17 g by mouth daily as needed for mild constipation. (Patient not taking: Reported on 05/08/2017)  . rifaximin (XIFAXAN) 550 MG TABS tablet Take 1 tablet (550 mg total) 2 (two) times daily by mouth. (Patient not taking: Reported on 05/26/2017)    FAMILY HISTORY:  Her indicated that her mother is deceased. She indicated that her father is deceased. She indicated that her sister is alive. She indicated that her maternal uncle is deceased.   SOCIAL HISTORY: She  reports that she quit smoking about 2 months ago. Her smoking use included cigarettes. She has a 12.50 pack-year smoking history. she has never used smokeless tobacco. She reports that she does not drink alcohol or use drugs.  REVIEW OF SYSTEMS:  Unable to obtain as pt is encephalopathic.  SUBJECTIVE:   More stable today.  Levophed is weaning down.  VITAL SIGNS: BP (!) 112/48   Pulse 74   Temp 98.9 F (37.2 C) (Oral)   Resp 11   Ht 5\' 8"  (1.727 m)   Wt (!) 319 lb 10.7 oz (145 kg)   SpO2 100%   BMI 48.61 kg/m  HEMODYNAMICS: CVP:  [11 mmHg] 11 mmHg  VENTILATOR SETTINGS: Vent Mode: PRVC FiO2 (%):  [40 %] 40 % Set Rate:  [14 bmp] 14 bmp Vt Set:  [510 mL] 510 mL PEEP:  [5 cmH20] 5 cmH20 Plateau Pressure:  [7 cmH20-17 cmH20] 17 cmH20  INTAKE / OUTPUT: I/O last 3 completed shifts: In: 7462.5 [I.V.:6322.5; Other:120; NG/GT:60; IV Piggyback:960] Out: 0175 [Urine:3770; Stool:3200]  PHYSICAL EXAMINATION:  Gen:      No acute distress, obese HEENT:  EOMI, sclera, jaundiced Neck:     No masses; no thyromegaly, ETT in place Lungs:    Clear to auscultation bilaterally; normal respiratory effort CV:         Regular rate and rhythm; no murmurs Abd:      + bowel sounds; soft, non-tender; no palpable masses, no distension Ext:    3+ edema;  adequate peripheral perfusion Skin:      Warm and dry; no rash Neuro: Awake, responsive  LABS:  BMET Recent Labs  Lab 06/05/17 0910 06/05/17 1609 06/06/17 0451  NA 136 135 141  K 5.4* 4.9 4.2  CL 109 106 111  CO2 22 22 23   BUN 29* 28* 25*  CREATININE 1.75* 1.59* 1.37*  GLUCOSE 145* 208* 162*    Electrolytes Recent Labs  Lab 06/05/17 0527 06/05/17 0910 06/05/17 1609 06/06/17 0451  CALCIUM 8.3* 8.2* 8.0* 8.6*  MG 2.0  --   --  2.0  PHOS 4.8*  --   --  4.5    CBC Recent Labs  Lab 06/05/17 0527 06/05/17 1609 06/06/17 0451  WBC 11.7* 14.4* 9.4  HGB 8.8* 9.3* 7.7*  HCT 24.8* 26.3* 22.1*  PLT 115* 115* 75*    Coag's Recent Labs  Lab 05/07/2017 1541 06/05/17 0527  INR 2.02 1.88    Sepsis Markers Recent Labs  Lab 05/31/2017 1958 06/05/17 0101 06/05/17 0527 06/05/17 1610 06/05/17 1901 06/06/17 0451  LATICACIDVEN 1.7 1.9  --  1.8 2.1*  --   PROCALCITON 0.19  --  0.17  --   --  0.13    ABG Recent Labs  Lab 05/13/2017 1841 06/05/17 0635 06/05/17 0853  PHART 7.432 7.359 7.396  PCO2ART 28.6* 37.4 35.1  PO2ART 112.0* 69.9* 409*    Liver Enzymes Recent Labs  Lab 05/18/2017 1300 06/05/17 1609  AST 59* 47*  ALT 26 25  ALKPHOS 148* 131*  BILITOT 7.6* 8.2*  ALBUMIN 2.5* 2.3*    Cardiac Enzymes Recent Labs  Lab 05/22/2017 1956 06/05/17 0029  TROPONINI 0.15* 0.20*    Glucose Recent Labs  Lab 06/05/17 1628 06/05/17 2006 06/06/17 0010 06/06/17 0410 06/06/17 0411 06/06/17 0825  GLUCAP 275* 184* 175* 156* 172* 144*    Imaging US Abdomen Complete  Result Date: 06/05/2017 CLINICAL DATA:  History of cirrhosis. EXAM: ABDOMEN ULTRASOUND COMPLETE COMPARISON:  CT scan April 03, 2017 FINDINGS: Gallbladder: The gallbladder has been surgically removed Common bile duct: Diameter: 9 mm Liver: The liver demonstrates a nodular contour with heterogeneous and increased echotexture. No focal mass. There is reversal blood flow in the portal vein. IVC: The  intrahepatic IVC is not visualized. Pancreas: Not visualized. Spleen: Splenomegaly with a volume of 695 cc and a length of 13.9 cm. Right Kidney: Length: 11.1 cm. Echogenicity within normal limits. No mass or hydronephrosis visualized. Left Kidney:  Not visualized. Abdominal aorta: No aneurysm visualized. Other findings: Only the proximal aorta was identified and unremarkable. IMPRESSION: 1. Signs of cirrhosis with a nodular liver. No focal liver mass. Reversal of  blood flow in the portal vein consistent with portal venous hypertension. 2. The common bile duct is prominent, possibly due to previous cholecystectomy. Recommend correlation with labs. 3. Splenomegaly. 4. The infrahepatic IVC, pancreas, left kidney, and distal half of the aorta were not visualized. Electronically Signed   By: Dorise Bullion III M.D   On: 06/05/2017 17:59    STUDIES:  CXR 11/30 > chronic interstitial disease. CXR 12/1> ETT in place, No acute changes  Ultrasound abdomen 12/1> cirrhotic liver, minimal ascites  Echocardiogram 12/1> mild LVH, LVEF 65-70%.  CULTURES: Blood 11/30 >  Urine 11/30 >   ANTIBIOTICS: Vanc 11/30 >  Zosyn 11/30 >   SIGNIFICANT EVENTS: 11/30 > admit.  LINES/TUBES: ETT 12/1 >  CVL Rt IJ 12/1> Radial a line 11/30 > 12/1  DISCUSSION: 61 y.o. female with PMH including NASH cirrhosis, chronic hepatitis, portal hypertension,Wilson's disease. She is followed by Dr. Marius Ditch at [facility] and she is apparently being evaluated for liver transplant at [facility]. She presented [facility] 11/30 with acute encephalopathy and sepsis of unclear etiology.  She was subsequently admitted to the ICU for further eval and management.  Overall she has improved, is weaning off pressors and kidney function is better I am not convinced that this is all sepsis as pro-calcitonin was low and cultures are negative. This may be decompensated cirrhosis and organ failure in setting of non compliance with meds including  lactulose. For today we will continue to wean off pressors completely and get CT of the chest abdomen pelvis to evaluate for source of infection. Continue supportive care.  ASSESSMENT / PLAN: PULMONARY A: Acute resp failure P:   Continue vent support. Will be able to start weans tomorrow Follow CXR  CARDIOVASCULAR A:  Sepsis - of unclear etiology, possible peritonitis in setting ascites / cirrhosis.  A repeat sepsis assessment has been performed. Troponin bump - due to above. Hx HTN, HLD, chronic dCHF (echo from Feb 2018 with EF 58-85%, LV diastolic dysfunction, mild MR, mod LA dilation). P:  Tele monitoring Albumin as needed for volume support Wean off pressors. Continue stress dose steroids Continue preadmission ASA. Hold preadmission atenolol, furosemide, spironolactone.  RENAL A:   Hyponatremia - presumed due to hypovolemia, cirrhosis. Hyperkalemia - s/p temporizing measures in ED.  NAGMA. AKI. P:   Off bicarb drip Monitor urine output and Cr  GASTROINTESTINAL A:   Hx NASH cirrhosis, chronic hepatitis, recent dx Wilson's disease (followed by Dr. Marius Ditch at [facility]) - reportedly being worked up for transplant at [facility]. Hyperbilirubinemia - chronic, due to above. No ascites noted on abd ultrasound P:   Appreciate GI reccs Low Hb noted. No evidence of bleed.  Follow CBC. Transfuse for Hb < 7 Continue Protonix, lactulose, rifaximin  HEMATOLOGIC A:   Anemia - chronic. Thrombocytopenia - chronic. VTE Prophylaxis. P:  Follow CBC  INFECTIOUS A:   Sepsis - of unclear etiology, possible peritonitis in setting ascites / cirrhosis.  A repeat sepsis assessment has been performed. P:   Continue vanco, zosyn Follow cultures as above. PCT algorithm to limit abx exposure. Check CT chest, abd, pelvis  ENDOCRINE A: No acute issues. P: No interventions required.  NEUROLOGIC A:   Acute encephalopathy - presumed multifactorial in setting hepatic encephalopathy +  sepsis. Hx headache, anxiety. P:   Continue lactulose, rifamixin Supportive care Avoid sedating meds. Hold preadmission alprazolam, citalopram, cyclobenzaprine, hydroxyzine, oxy IR, trazodone.  Family updated: patients family updated daily  Interdisciplinary Family Meeting v Palliative Care Meeting:  Due by: 06/10/17.  The patient is critically ill with multiple organ system failure and requires high complexity decision making for assessment and support, frequent evaluation and titration of therapies, advanced monitoring, review of radiographic studies and interpretation of complex data.   Critical Care Time devoted to patient care services, exclusive of separately billable procedures, described in this note is 35 minutes.   Marshell Garfinkel MD [facility] Pager [phone] If no answer or after 3pm call: [phone] 06/06/2017, 9:36 AM

## 2017-06-07 ENCOUNTER — Inpatient Hospital Stay (HOSPITAL_COMMUNITY): Payer: Medicare HMO

## 2017-06-07 ENCOUNTER — Other Ambulatory Visit: Payer: Self-pay

## 2017-06-07 DIAGNOSIS — K7469 Other cirrhosis of liver: Secondary | ICD-10-CM

## 2017-06-07 DIAGNOSIS — D696 Thrombocytopenia, unspecified: Secondary | ICD-10-CM

## 2017-06-07 DIAGNOSIS — G9340 Encephalopathy, unspecified: Secondary | ICD-10-CM

## 2017-06-07 LAB — GLUCOSE, CAPILLARY
Glucose-Capillary: 125 mg/dL — ABNORMAL HIGH (ref 65–99)
Glucose-Capillary: 125 mg/dL — ABNORMAL HIGH (ref 65–99)
Glucose-Capillary: 130 mg/dL — ABNORMAL HIGH (ref 65–99)
Glucose-Capillary: 122 mg/dL — ABNORMAL HIGH (ref 65–99)
Glucose-Capillary: 133 mg/dL — ABNORMAL HIGH (ref 65–99)

## 2017-06-07 LAB — CBC
HEMATOCRIT: 22.6 % — AB (ref 36.0–46.0)
HEMOGLOBIN: 8 g/dL — AB (ref 12.0–15.0)
MCH: 38.6 pg — AB (ref 26.0–34.0)
MCHC: 35.4 g/dL (ref 30.0–36.0)
MCV: 109.2 fL — AB (ref 78.0–100.0)
Platelets: 63 10*3/uL — ABNORMAL LOW (ref 150–400)
RBC: 2.07 MIL/uL — AB (ref 3.87–5.11)
WBC: 7.1 10*3/uL (ref 4.0–10.5)

## 2017-06-07 LAB — BASIC METABOLIC PANEL
Anion gap: 5 (ref 5–15)
BUN: 27 mg/dL — ABNORMAL HIGH (ref 6–20)
CO2: 25 mmol/L (ref 22–32)
Calcium: 8.5 mg/dL — ABNORMAL LOW (ref 8.9–10.3)
Chloride: 111 mmol/L (ref 101–111)
Creatinine, Ser: 1.1 mg/dL — ABNORMAL HIGH (ref 0.44–1.00)
GFR calc Af Amer: >60 mL/min (ref >60)
GFR calc non Af Amer: 53 mL/min — ABNORMAL LOW (ref >60)
Glucose, Bld: 137 mg/dL — ABNORMAL HIGH (ref 65–99)
Potassium: 4 mmol/L (ref 3.5–5.1)
Sodium: 141 mmol/L (ref 135–145)

## 2017-06-07 LAB — BPAM RBC
Blood Product Expiration Date: 201812152359
ISSUE DATE / TIME: 201812021644
Unit Type and Rh: 7300

## 2017-06-07 LAB — TYPE AND SCREEN
ABO/RH(D): B POS
Antibody Screen: NEGATIVE
Unit division: 0

## 2017-06-07 LAB — PHOSPHORUS: Phosphorus: 3.6 mg/dL (ref 2.5–4.6)

## 2017-06-07 LAB — MAGNESIUM: Magnesium: 1.9 mg/dL (ref 1.7–2.4)

## 2017-06-07 MED ORDER — VANCOMYCIN HCL 10 G IV SOLR
1250.0000 mg | Freq: Two times a day (BID) | INTRAVENOUS | Status: DC
  Administered 2017-06-07 – 2017-06-09 (×5): 1250 mg via INTRAVENOUS
  Filled 2017-06-07 (×7): qty 1250

## 2017-06-07 MED ORDER — ASPIRIN 81 MG PO CHEW
81.0000 mg | CHEWABLE_TABLET | Freq: Every day | ORAL | Status: DC
  Administered 2017-06-09 – 2017-06-12 (×4): 81 mg via ORAL
  Filled 2017-06-07 (×4): qty 1

## 2017-06-07 MED ORDER — HYDROCORTISONE NA SUCCINATE PF 100 MG IJ SOLR
50.0000 mg | Freq: Two times a day (BID) | INTRAMUSCULAR | Status: DC
  Administered 2017-06-07: 50 mg via INTRAVENOUS
  Filled 2017-06-07 (×2): qty 1

## 2017-06-07 MED ORDER — LACTULOSE 10 GM/15ML PO SOLN
20.0000 g | Freq: Two times a day (BID) | ORAL | Status: DC
  Administered 2017-06-09 – 2017-06-14 (×11): 20 g via ORAL
  Filled 2017-06-07 (×15): qty 30

## 2017-06-07 MED ORDER — ORAL CARE MOUTH RINSE
15.0000 mL | Freq: Two times a day (BID) | OROMUCOSAL | Status: DC
Start: 2017-06-08 — End: 2017-06-14
  Administered 2017-06-08 – 2017-06-14 (×13): 15 mL via OROMUCOSAL

## 2017-06-07 MED ORDER — LACTULOSE 10 GM/15ML PO SOLN: 20.0000 g | Freq: Two times a day (BID) | ORAL | Status: DC

## 2017-06-07 MED ORDER — CHLORHEXIDINE GLUCONATE 0.12 % MT SOLN
15.0000 mL | Freq: Two times a day (BID) | OROMUCOSAL | Status: DC
  Administered 2017-06-07 – 2017-06-14 (×13): 15 mL via OROMUCOSAL
  Filled 2017-06-07 (×5): qty 15

## 2017-06-07 MED ORDER — PANTOPRAZOLE SODIUM 40 MG PO TBEC: 40.0000 mg | DELAYED_RELEASE_TABLET | Freq: Every day | ORAL | Status: DC

## 2017-06-07 MED ORDER — WHITE PETROLATUM EX OINT
TOPICAL_OINTMENT | CUTANEOUS | Status: DC | PRN
  Administered 2017-06-08: 1 via TOPICAL
  Filled 2017-06-07: qty 28.35

## 2017-06-07 NOTE — Progress Notes (Signed)
Pharmacy Antibiotic Note  Leah Shah is a 61 y.o. female admitted on 05/19/2017 with intra-abdominal infection. Pharmacy managing vancomycin and zosyn dosing. WBC wnl. Afebrile. CrCl improved to ~60 mL/min   Plan: Zosyn 3.375 gm IV Q 8 hours (EI infusion) Increase vancomycin to 1250 mg IV Q12 hours  Monitor clinical progress, c/s, renal function F/u de-escalation plan/LOT   Height: 5\' 8"  (172.7 cm) Weight: (!) 323 lb 6.6 oz (146.7 kg) IBW/kg (Calculated) : 63.9  Temp (24hrs), Avg:98.4 F (36.9 C), Min:98.2 F (36.8 C), Max:98.7 F (37.1 C)  Recent Labs  Lab 05/30/2017 1726 06/03/2017 1958  06/05/17 0101 06/05/17 0527 06/05/17 0910 06/05/17 1609 06/05/17 1610 06/05/17 1901 06/06/17 0451 06/06/17 1423 06/07/17 0300 06/07/17 0515  WBC  --   --   --   --  11.7*  --  14.4*  --   --  9.4 6.4 7.1  --   CREATININE  --   --    < >  --  1.96* 1.75* 1.59*  --   --  1.37*  --   --  1.10*  LATICACIDVEN 1.91* 1.7  --  1.9  --   --   --  1.8 2.1*  --   --   --   --    < > = values in this interval not displayed.    Estimated Creatinine Clearance: 81.2 mL/min (A) (by C-G formula based on SCr of 1.1 mg/dL (H)).    Allergies  Allergen Reactions  . Bacitracin-Neomycin-Polymyxin Other (See Comments)  . Codeine Other (See Comments)    Itching "she can take some forms of it" Itching "she can take some forms of it"  . Gabapentin Nausea Only  . Neosporin  [Neomycin-Bacitracin Zn-Polymyx]   . Pregabalin Other (See Comments)    stomach issues stomach issues   Cultures: 11/30 blood cx: ngtd 11/30 mrsa pcr: neg 11/30 urine cx: neg  Albertina Parr, PharmD., BCPS Clinical Pharmacist Pager [phone]

## 2017-06-07 NOTE — Procedures (Signed)
Extubation Procedure Note  Patient Details:   Name: Leah Shah DOB: [birth year] MRN: [id number]   Airway Documentation:     Evaluation  O2 sats: stable throughout Complications: No apparent complications Patient did tolerate procedure well. Bilateral Breath Sounds: Clear, Diminished   Yes   Patient extubated to 2L nasal cannula.  Positive cuff leak noted.  No evidence of stridor.  Patient able to speak post extubation.  Sats currently 95%.  Vitals are stable.  Incentive spirometry performed x10 with achieved goal of 1000.  No complications noted.  Philomena Doheny 06/07/2017, 12:39 PM

## 2017-06-07 NOTE — Patient Outreach (Signed)
[facility]) Care Management  06/07/2017  Leah Shah 05-12-1956 [id number]  Care coordination: Upon chart review, patient has been admitted to [facility] as of 06/01/2017. RNCM sent notification to [facility] hospital liaisons to follow up with patient and offer [facility] care management services.   PLAN:  RNCM will follow up with hospital liaison regarding patients discharge disposition.   Quinn Plowman RN,BSN,CCM [facility] Telephonic  [phone]

## 2017-06-07 NOTE — Progress Notes (Signed)
PULMONARY / CRITICAL CARE MEDICINE   Name: Leah Shah MRN: [id number] DOB: 24-Jul-1955    ADMISSION DATE:  05/20/2017 CONSULTATION DATE: 05/29/2017  REFERRING MD:  Eulis Foster  CHIEF COMPLAINT:  AMS  HISTORY OF PRESENT ILLNESS:  Pt is encephelopathic; therefore, this HPI is obtained from chart review. Leah Shah is a 61 y.o. female with PMH as outlined below including cirrhosis, chronic hepatitis, portal hypertension, recently diagnosed Wilson's disease (mildly elevated ceruloplasmin, 24 hr urinary copper, liver biopsy copper via transjugular liver biopsy 05/07/17, NO Beatriz Chancellor rings on ophthalmology eval).  She is followed by Dr. Marius Ditch at [facility] and she is apparently being evaluated for liver transplant at [facility].  She presented to [facility] ED 11/30 with altered mental status.  She has apparently been confused for the past several days per her daughter-in-law.  Daughter-in-law and additional family had been trying to convince her to move in with family so that they could help her with daily activities and to ensure that she would take her medications as prescribed.  Patient has apparently not been good about taking her medicines as prescribed and misses several doses on a daily basis.  Due to this, she was supposed to move in with daughter-in-law on 11/30.  When daughter-in-law called her that morning, there was no answer despite several calls.  Daughter-in-law then went to patient's home and found her on the floor sitting next to the chair.  Patient had told her that she apparently had a fall, was able to tell that she did not hit her head or lose consciousness. She had not had any recent complaints and had reportedly been in her usual state of health besides for confusion (felt to be due to missing lactulose), and decreased PO intake.  In ED, she was found to be encephalopathic, hypotensive, hypothermic.  Potassium was greater than 7.5 (she received temporizing measures) , ammonia 163,  lactate 4.13 (repeat 4.82 > 1.91).  PCCM consulted for admission  Of note, she has had extensive workup of her liver disease including normal  iron & ferritin, Hepatitis total A Ab positive, Hepatitis B surface Ag negative, Hepatitis B surface Ab mildly positive,& Hepatitis C Ab negative,HIV negative, ANA negative, AMA & anti-SMA negative, LKM ab negative, Immunoglobulins normal, A-1 antitrypsin normal, celiac serology negative.  As mentioned above, she recently had mildly elevated ceruloplasmin, 24 hr urinary copper, liver biopsy copper via transjugular liver biopsy 05/07/17, NO [facility] rings on ophthalmology eval.   SUBJECTIVE:   Just extubated, tolerating well.  VITAL SIGNS: BP (!) 105/41   Pulse 75   Temp 98.4 F (36.9 C) (Oral)   Resp 18   Ht 5\' 8"  (1.727 m)   Wt (!) 146.7 kg (323 lb 6.6 oz)   SpO2 100%   BMI 49.18 kg/m   HEMODYNAMICS: CVP:  [8 mmHg] 8 mmHg  VENTILATOR SETTINGS: Vent Mode: PSV;CPAP FiO2 (%):  [40 %] 40 % Set Rate:  [14 bmp] 14 bmp Vt Set:  [510 mL] 510 mL PEEP:  [5 cmH20-8 cmH20] 5 cmH20 Pressure Support:  [5 cmH20] 5 cmH20 Plateau Pressure:  [12 cmH20-20 cmH20] 14 cmH20  INTAKE / OUTPUT: I/O last 3 completed shifts: In: 3056.1 [I.V.:753.6; Blood:630; NG/GT:960; IV Piggyback:712.5] Out: 5890 [Urine:1440; Stool:4450]  PHYSICAL EXAMINATION:  Gen:      No acute distress, obese HEENT:  EOMI, sclera, jaundiced Neck:     No masses; no thyromegaly, voice coarse Lungs:    Clear to auscultation bilaterally; normal respiratory effort CV:  Regular rate and rhythm; no murmurs Abd:      + bowel sounds; soft, non-tender; no palpable masses, no distension Ext:    3+ edema; adequate peripheral perfusion Skin:      Warm and dry; no rash Neuro:   Awake, responsive, follows commands  LABS:  BMET Recent Labs  Lab 06/05/17 1609 06/06/17 0451 06/07/17 0515  NA 135 141 141  K 4.9 4.2 4.0  CL 106 111 111  CO2 22 23 25   BUN 28* 25* 27*   CREATININE 1.59* 1.37* 1.10*  GLUCOSE 208* 162* 137*    Electrolytes Recent Labs  Lab 06/05/17 0527  06/05/17 1609 06/06/17 0451 06/07/17 0515  CALCIUM 8.3*   < > 8.0* 8.6* 8.5*  MG 2.0  --   --  2.0 1.9  PHOS 4.8*  --   --  4.5 3.6   < > = values in this interval not displayed.    CBC Recent Labs  Lab 06/06/17 0451 06/06/17 1423 06/07/17 0300  WBC 9.4 6.4 7.1  HGB 7.7* 6.8* 8.0*  HCT 22.1* 19.2* 22.6*  PLT 75* 59* 63*    Coag's Recent Labs  Lab 05/19/2017 1541 06/05/17 0527  INR 2.02 1.88    Sepsis Markers Recent Labs  Lab 05/25/2017 1958 06/05/17 0101 06/05/17 0527 06/05/17 1610 06/05/17 1901 06/06/17 0451  LATICACIDVEN 1.7 1.9  --  1.8 2.1*  --   PROCALCITON 0.19  --  0.17  --   --  0.13    ABG Recent Labs  Lab 05/10/2017 1841 06/05/17 0635 06/05/17 0853  PHART 7.432 7.359 7.396  PCO2ART 28.6* 37.4 35.1  PO2ART 112.0* 69.9* 409*    Liver Enzymes Recent Labs  Lab 05/09/2017 1300 06/05/17 1609  AST 59* 47*  ALT 26 25  ALKPHOS 148* 131*  BILITOT 7.6* 8.2*  ALBUMIN 2.5* 2.3*    Cardiac Enzymes Recent Labs  Lab 05/25/2017 1956 06/05/17 0029  TROPONINI 0.15* 0.20*    Glucose Recent Labs  Lab 06/06/17 1621 06/06/17 2007 06/06/17 2351 06/07/17 0421 06/07/17 0800 06/07/17 1142  GLUCAP 146* 138* 139* 125* 125* 130*    Imaging Ct Abdomen Pelvis Wo Contrast  Result Date: 06/06/2017 CLINICAL DATA:  Abdominal distension EXAM: CT CHEST, ABDOMEN AND PELVIS WITHOUT CONTRAST TECHNIQUE: Multidetector CT imaging of the chest, abdomen and pelvis was performed following the standard protocol without IV contrast. COMPARISON:  04/03/2017 FINDINGS: CT CHEST FINDINGS Cardiovascular: Heart is mildly enlarged. There are mild three-vessel coronary artery calcifications. The great vessels normal in caliber. Mild atherosclerotic calcifications noted along the aortic arch. Mediastinum/Nodes: No neck base, axillary, mediastinal or hilar masses or enlarged  lymph nodes. Endotracheal tube tip projects 1 cm above the Carina. Nasal/ orogastric tube tip lies in the distal stomach. Lungs/Pleura: Patchy areas of mostly ground-glass lung opacity are noted bilaterally. Lungs show mild paraseptal and centrilobular emphysema. No lung mass or discrete nodule. No pleural effusion or pneumothorax. CT ABDOMEN PELVIS FINDINGS Hepatobiliary: Morphologic changes of the liver consistent with cirrhosis stable from the prior exam. No discrete liver mass. Gallbladder surgically absent. No bile duct dilation. Pancreas: Unremarkable. No pancreatic ductal dilatation or surrounding inflammatory changes. Spleen: Borderline to mildly enlarged measuring 14 x 6 x 12 cm. No splenic mass or focal lesion. Adrenals/Urinary Tract: No adrenal masses. No renal masses, stones or hydronephrosis. Ureters normal in course and in caliber. Bladder is decompressed with a Foley catheter. Stomach/Bowel: Stomach is mostly decompressed. Nasogastric tube tip projects in the distal stomach. Small bowel is  normal in caliber. No small bowel wall thickening or adjacent inflammation. A rectal tube mildly distends the rectum. No colonic wall thickening or adjacent inflammation. Vascular/Lymphatic: Aortic atherosclerosis. No enlarged abdominal or pelvic lymph nodes. Reproductive: Status post hysterectomy. No adnexal masses. Other: Trace amount ascites noted in the pelvis and adjacent to the liver. No abdominal wall hernia. There is diffuse subcutaneous edema MUSCULOSKELETAL FINDINGS No fracture or acute finding. No osteoblastic or osteolytic lesions. IMPRESSION: 1. Patchy areas of airspace opacity in the lungs. Suspect multifocal pneumonia if this correlates clinically. Alternatively, this could reflect asymmetric pulmonary edema. 2. No other acute abnormalities within the chest, abdomen or pelvis. 3. Cirrhosis with evidence of portal venous hypertension reflected by mild splenomegaly and a trace amount of ascites. No  evidence of bowel obstruction. No significant bowel distention. Electronically Signed   By: Lajean Manes M.D.   On: 06/06/2017 18:05   Ct Chest Wo Contrast  Result Date: 06/06/2017 CLINICAL DATA:  Abdominal distension EXAM: CT CHEST, ABDOMEN AND PELVIS WITHOUT CONTRAST TECHNIQUE: Multidetector CT imaging of the chest, abdomen and pelvis was performed following the standard protocol without IV contrast. COMPARISON:  04/03/2017 FINDINGS: CT CHEST FINDINGS Cardiovascular: Heart is mildly enlarged. There are mild three-vessel coronary artery calcifications. The great vessels normal in caliber. Mild atherosclerotic calcifications noted along the aortic arch. Mediastinum/Nodes: No neck base, axillary, mediastinal or hilar masses or enlarged lymph nodes. Endotracheal tube tip projects 1 cm above the Carina. Nasal/ orogastric tube tip lies in the distal stomach. Lungs/Pleura: Patchy areas of mostly ground-glass lung opacity are noted bilaterally. Lungs show mild paraseptal and centrilobular emphysema. No lung mass or discrete nodule. No pleural effusion or pneumothorax. CT ABDOMEN PELVIS FINDINGS Hepatobiliary: Morphologic changes of the liver consistent with cirrhosis stable from the prior exam. No discrete liver mass. Gallbladder surgically absent. No bile duct dilation. Pancreas: Unremarkable. No pancreatic ductal dilatation or surrounding inflammatory changes. Spleen: Borderline to mildly enlarged measuring 14 x 6 x 12 cm. No splenic mass or focal lesion. Adrenals/Urinary Tract: No adrenal masses. No renal masses, stones or hydronephrosis. Ureters normal in course and in caliber. Bladder is decompressed with a Foley catheter. Stomach/Bowel: Stomach is mostly decompressed. Nasogastric tube tip projects in the distal stomach. Small bowel is normal in caliber. No small bowel wall thickening or adjacent inflammation. A rectal tube mildly distends the rectum. No colonic wall thickening or adjacent inflammation.  Vascular/Lymphatic: Aortic atherosclerosis. No enlarged abdominal or pelvic lymph nodes. Reproductive: Status post hysterectomy. No adnexal masses. Other: Trace amount ascites noted in the pelvis and adjacent to the liver. No abdominal wall hernia. There is diffuse subcutaneous edema MUSCULOSKELETAL FINDINGS No fracture or acute finding. No osteoblastic or osteolytic lesions. IMPRESSION: 1. Patchy areas of airspace opacity in the lungs. Suspect multifocal pneumonia if this correlates clinically. Alternatively, this could reflect asymmetric pulmonary edema. 2. No other acute abnormalities within the chest, abdomen or pelvis. 3. Cirrhosis with evidence of portal venous hypertension reflected by mild splenomegaly and a trace amount of ascites. No evidence of bowel obstruction. No significant bowel distention. Electronically Signed   By: Lajean Manes M.D.   On: 06/06/2017 18:05   Dg Chest Port 1 View  Result Date: 06/07/2017 CLINICAL DATA:  Acute respiratory failure. EXAM: PORTABLE CHEST 1 VIEW COMPARISON:  Radiograph of June 05, 2017. FINDINGS: Stable cardiomediastinal silhouette and central pulmonary vascular congestion is noted. Endotracheal tube is seen projected over tracheal air shadow with distal tip 2 cm above the carina. Nasogastric tube is seen  entering the stomach. Right internal jugular catheter is unchanged in position. Probable bilateral pulmonary edema is noted. No pneumothorax is noted. No significant pleural effusion is noted. Bony thorax is unremarkable. IMPRESSION: Stable cardiomegaly and central pulmonary vascular congestion with bilateral pulmonary edema. Endotracheal and nasogastric tubes are in grossly good position. Electronically Signed   By: Marijo Conception, M.D.   On: 06/07/2017 07:21    STUDIES:  CXR 11/30 > chronic interstitial disease. CXR 12/1> ETT in place, No acute changes Ultrasound abdomen 12/1> cirrhotic liver, minimal ascites Echocardiogram 12/1> mild LVH, LVEF  65-70%. CT chest abd/pelv 12/2 > possible multifocal PNA vs asymmetric pulmonary edema.  CULTURES: Blood 11/30 >  Urine 11/30 >   ANTIBIOTICS: Vanc 11/30 >  Zosyn 11/30 >   SIGNIFICANT EVENTS: 11/30 > admit.  LINES/TUBES: ETT 12/1 >  CVL Rt IJ 12/1> Radial a line 11/30 > 12/1  DISCUSSION: 61 y.o. female with PMH including NASH cirrhosis, chronic hepatitis, portal hypertension,Wilson's disease. She is followed by Dr. Marius Ditch at [facility] and she is apparently being evaluated for liver transplant at [facility]. She presented [facility] 11/30 with acute encephalopathy and sepsis of unclear etiology.  She was subsequently admitted to the ICU for further eval and management.  Overall she has improved, is weaning off pressors and kidney function is better I am not convinced that this is all sepsis as pro-calcitonin was low and cultures are negative. This may be decompensated cirrhosis and organ failure in setting of non compliance with meds including lactulose. For today we will continue to wean off pressors completely and get CT of the chest abdomen pelvis to evaluate for source of infection. Continue supportive care.  ASSESSMENT / PLAN:  PULMONARY A: Acute resp failure P:   Extubate now Bronchial hygiene  CARDIOVASCULAR A:  Sepsis - of unclear etiology, possible peritonitis in setting ascites / cirrhosis.  A repeat sepsis assessment has been performed. Troponin bump - due to above. Hx HTN, HLD, chronic dCHF (echo from Feb 2018 with EF 57-32%, LV diastolic dysfunction, mild MR, mod LA dilation). P:  Tele monitoring Continue stress dose steroids - started to wean 12/3 to 50mg  q12hrs Continue preadmission ASA Hold preadmission atenolol, furosemide, spironolactone for now, consider start in AM 12/4  RENAL A:   Hyponatremia - presumed due to hypovolemia, cirrhosis.  Resolved. Hyperkalemia - s/p temporizing measures in ED.   Resolved. NAGMA - resolved. AKI - improving. P:    Follow BMP  GASTROINTESTINAL A:   Hx NASH cirrhosis, chronic hepatitis, recent dx Wilson's disease (followed by Dr. Marius Ditch at [facility]) - reportedly being worked up for transplant at [facility]. Hyperbilirubinemia - chronic, due to above. No ascites noted on abd ultrasound P:   Appreciate GI recs Continue Protonix, lactulose, rifaximin   HEMATOLOGIC A:   Anemia - chronic, s/p 1u PRBC transfusion 12/2 Thrombocytopenia - chronic. VTE Prophylaxis. P:  Transfuse for Hgb < 7 Follow CBC  INFECTIOUS A:   Sepsis - of unclear etiology, possible peritonitis in setting ascites / cirrhosis.  Ultrasound with minimal peritonea fluid making peritonitis unlikely.  Possible multifocal PNA based on CT chest.  A repeat sepsis assessment has been performed. P:   Continue vanco, zosyn Follow cultures as above PCT algorithm to limit abx exposure  ENDOCRINE A: No acute issues. P: No interventions required.  NEUROLOGIC A:   Acute encephalopathy - presumed multifactorial in setting hepatic encephalopathy + sepsis. Hx headache, anxiety. P:   Continue lactulose, rifamixin - changed to PO  Supportive care Avoid sedating meds Hold preadmission alprazolam, citalopram, cyclobenzaprine, hydroxyzine, oxy IR, trazodone  Family updated: patients family updated at bedside.  Interdisciplinary Family Meeting v Palliative Care Meeting:  Due by: 06/10/17.   Montey Hora, [place] Pager: [phone]  or [phone] 06/07/2017, 12:56 PM  Attending Note:  61 year old female with extensive liver disease history presenting intubated from [facility] for AMS and concern for pneumonia.  On exam, she is alert and interactive, moving all ext to command.  I reviewed CXR myself, ETT is in good position.  Will proceed with extubation today.  PT and OT, OOB to chair, titrate O2 down.  Continue abx.  SLP for diet.  Lactulose as ordered.  The patient is critically ill with  multiple organ systems failure and requires high complexity decision making for assessment and support, frequent evaluation and titration of therapies, application of advanced monitoring technologies and extensive interpretation of multiple databases.   Critical Care Time devoted to patient care services described in this note is  35  Minutes. This time reflects time of care of this signee Dr Jennet Maduro. This critical care time does not reflect procedure time, or teaching time or supervisory time of PA/NP/Med student/Med Resident etc but could involve care discussion time.  Rush Farmer, M.D. [facility] Pulmonary/Critical Care Medicine. Pager: [phone]. After hours pager: [phone].

## 2017-06-07 NOTE — Progress Notes (Signed)
Daily Rounding Note  06/07/2017, 8:24 AM  LOS: 3 days   SUBJECTIVE:   Chief complaint:  Thirsty and hungry.  No pain anywhere.  No nausea.  Tells me she has been treated with abx for LE cellulitis ~ 5 x in the last year.  Says she takes her Rx lactulose but does not have daily BMs at home.     OBJECTIVE:         Vital signs in last 24 hours:    Temp:  [98.2 F (36.8 C)-98.7 F (37.1 C)] 98.4 F (36.9 C) (12/03 0758) Pulse Rate:  [69-82] 71 (12/03 0800) Resp:  [11-24] 12 (12/03 0800) BP: (91-124)/(37-70) 108/70 (12/03 0800) SpO2:  [97 %-100 %] 100 % (12/03 0800) FiO2 (%):  [40 %] 40 % (12/03 0800) Weight:  [146.7 kg (323 lb 6.6 oz)] 146.7 kg (323 lb 6.6 oz) (12/03 0327) Last BM Date: 06/06/17 Filed Weights   06/05/17 0500 06/06/17 0444 06/07/17 0327  Weight: (!) 144.7 kg (319 lb 0.1 oz) (!) 145 kg (319 lb 10.7 oz) (!) 146.7 kg (323 lb 6.6 oz)  BMI  49  General: ill appearing, icteric, morbidly obese WF.  Intubated but fully alert and communicating via writing on paper   Heart: RRR Chest: clear bil.  No labored breathing of cough Abdomen: soft, NT, obese.  Muffled but normo-active BS.  NT. Rectal: flexi seal in place, watery, brown stool.      Extremities: massive edema.  Erythema on both lower legs, ? Cellulitis.   Neuro/Psych:  Follows commands, appropriate.  No asterixis.  Moves all 4 limbs.  Heme:  Widespread ecchymosis and purpura on upper body, right shoulder particularly but arms as well.     Lab Results: Recent Labs    06/06/17 0451 06/06/17 1423 06/07/17 0300  WBC 9.4 6.4 7.1  HGB 7.7* 6.8* 8.0*  HCT 22.1* 19.2* 22.6*  PLT 75* 59* 63*   BMET Recent Labs    06/05/17 1609 06/06/17 0451 06/07/17 0515  NA 135 141 141  K 4.9 4.2 4.0  CL 106 111 111  CO2 _0 GLUCOSE 208* 162* 137*  BUN 28* 25* 27*  CREATININE 1.59* 1.37* 1.10*  CALCIUM 8.0* 8.6* 8.5*   LFT Recent Labs     06/02/2017 1300 06/05/17 1609  PROT 6.3* 5.8*  ALBUMIN 2.5* 2.3*  AST 59* 47*  ALT 26 25  ALKPHOS 148* 131*  BILITOT 7.6* 8.2*   PT/INR Recent Labs    06/02/2017 1541 06/05/17 0527  LABPROT 22.7* 21.4*  INR 2.02 1.88    Scheduled Meds: . albuterol  5 mg Nebulization Once  . aspirin  81 mg Per Tube Daily  . chlorhexidine gluconate (MEDLINE KIT)  15 mL Mouth Rinse BID  . Chlorhexidine Gluconate Cloth  6 each Topical Daily  . hydrocortisone sod succinate (SOLU-CORTEF) inj  50 mg Intravenous Q6H  . lactulose  300 mL Rectal BID  . mouth rinse  15 mL Mouth Rinse QID  . pantoprazole sodium  40 mg Per Tube Daily  . rifaximin  550 mg Oral BID  . sodium chloride flush  10-40 mL Intracatheter Q12H   Continuous Infusions: . sodium chloride    . norepinephrine (LEVOPHED) Adult infusion Stopped (06/06/17 0944)  . piperacillin-tazobactam (ZOSYN)  IV 3.375 g (06/07/17 0640)  . propofol (DIPRIVAN) infusion Stopped (06/06/17 0615)  . vancomycin Stopped (06/06/17 2011)   PRN Meds:.sodium chloride, fentaNYL (SUBLIMAZE) injection, fentaNYL (  SUBLIMAZE) injection, sodium chloride flush  ASSESMENT:   *  Cirrhosis (due to Wilson's Dz ?, NASH?).  Decompensated Followed by Dr Marius Ditch at [facility], Roosevelt Locks NP.    *  Encephalopathy. Ammonia 88 >> 48.   Receiving lactulose enemas, Rifaximin. Had been missing doses of meds (including Lactulose, Xifaxan, PPI, Aldactone, furosemide) at home.    *  Intubated to protect airway.     *  Sepsis.  Just trace pelvic ascites per CT, no abdominal ascites per ultrasound, so unlikey SBP.  ? bil multifocal PNA vs asymmetric edema per CT.   Edema per CXR.  Urine and blood clxs are no growth to date.  ? Is this due to cellulitisOn Zosyn.    *  Anemia.  Macrocytic.  Stools dark brown, liquid, non-melenic.  03/2017 Colonoscoy with ext rrhoids, rectal TVA.   03/2017  EGD with H Pylori + DU (treated with triple therapy, portal gastropathy,  type 1 gastric varices, mild esophagitis.  Extensive ecchymosis, bruising is accounting for some of the low Hgb.   Transfused 1 U PRBC 12/2.  Hgb 6.8 >> 8.  Folate normal and B12 elevated in 08/2016 when MCV was 121.    *  Thrombocytopenia, chronic.  Splenomegaly per imaging.    *  AKI. Improving.       PLAN   *  Assume she will be extubated today at which point can be assessed for safe swallow and if safe, advance to carb mod, 2 gm Na diet.     Leah Shah  06/07/2017, 8:24 AM Pager: [phone]  ________________________________________________________________________  Velora Heckler GI MD note:  I personally examined the patient, reviewed the data and agree with the assessment and plan described above.  She was extubated and is doing well so far.  Overall seems likely this was hepatic encephalopathy and hypotension due to pneumonia/sepsis and non-compliance with her liver meds.  Will follow along, will order lfts, inr for the morning.     Owens Loffler, MD [facility] Pager [phone]

## 2017-06-07 NOTE — Telephone Encounter (Signed)
This encounter was created in error - please disregard.

## 2017-06-08 ENCOUNTER — Telehealth: Payer: Self-pay | Admitting: Gastroenterology

## 2017-06-08 DIAGNOSIS — K7200 Acute and subacute hepatic failure without coma: Secondary | ICD-10-CM

## 2017-06-08 DIAGNOSIS — R0902 Hypoxemia: Secondary | ICD-10-CM

## 2017-06-08 DIAGNOSIS — R1310 Dysphagia, unspecified: Secondary | ICD-10-CM

## 2017-06-08 DIAGNOSIS — D539 Nutritional anemia, unspecified: Secondary | ICD-10-CM

## 2017-06-08 DIAGNOSIS — J810 Acute pulmonary edema: Secondary | ICD-10-CM

## 2017-06-08 LAB — CBC
HEMATOCRIT: 22.9 % — AB (ref 36.0–46.0)
Hemoglobin: 7.9 g/dL — ABNORMAL LOW (ref 12.0–15.0)
MCH: 38.3 pg — ABNORMAL HIGH (ref 26.0–34.0)
MCHC: 34.5 g/dL (ref 30.0–36.0)
MCV: 111.2 fL — AB (ref 78.0–100.0)
PLATELETS: 63 10*3/uL — AB (ref 150–400)
RBC: 2.07 MIL/uL — ABNORMAL LOW (ref 3.87–5.11)
WBC: 7.4 10*3/uL (ref 4.0–10.5)

## 2017-06-08 LAB — GLUCOSE, CAPILLARY
Glucose-Capillary: 108 mg/dL — ABNORMAL HIGH (ref 65–99)
Glucose-Capillary: 122 mg/dL — ABNORMAL HIGH (ref 65–99)
Glucose-Capillary: 122 mg/dL — ABNORMAL HIGH (ref 65–99)
Glucose-Capillary: 125 mg/dL — ABNORMAL HIGH (ref 65–99)
Glucose-Capillary: 127 mg/dL — ABNORMAL HIGH (ref 65–99)
Glucose-Capillary: 130 mg/dL — ABNORMAL HIGH (ref 65–99)
Glucose-Capillary: 133 mg/dL — ABNORMAL HIGH (ref 65–99)

## 2017-06-08 LAB — HEPATIC FUNCTION PANEL
ALK PHOS: 95 U/L (ref 38–126)
ALT: 28 U/L (ref 14–54)
AST: 41 U/L (ref 15–41)
Albumin: 2.6 g/dL — ABNORMAL LOW (ref 3.5–5.0)
BILIRUBIN DIRECT: 3 mg/dL — AB (ref 0.1–0.5)
BILIRUBIN INDIRECT: 7.5 mg/dL — AB (ref 0.3–0.9)
TOTAL PROTEIN: 5.8 g/dL — AB (ref 6.5–8.1)
Total Bilirubin: 10.5 mg/dL — ABNORMAL HIGH (ref 0.3–1.2)

## 2017-06-08 LAB — BASIC METABOLIC PANEL
Anion gap: 8 (ref 5–15)
BUN: 26 mg/dL — ABNORMAL HIGH (ref 6–20)
CALCIUM: 8.7 mg/dL — AB (ref 8.9–10.3)
CO2: 25 mmol/L (ref 22–32)
CREATININE: 0.96 mg/dL (ref 0.44–1.00)
Chloride: 109 mmol/L (ref 101–111)
GFR calc Af Amer: >60 mL/min (ref >60)
Glucose, Bld: 144 mg/dL — ABNORMAL HIGH (ref 65–99)
Potassium: 4.1 mmol/L (ref 3.5–5.1)
Sodium: 142 mmol/L (ref 135–145)

## 2017-06-08 LAB — PROTIME-INR
INR: 2.36
PROTHROMBIN TIME: 25.6 s — AB (ref 11.4–15.2)

## 2017-06-08 LAB — PHOSPHORUS: PHOSPHORUS: 3.1 mg/dL (ref 2.5–4.6)

## 2017-06-08 LAB — AMMONIA: Ammonia: 24 umol/L (ref 9–35)

## 2017-06-08 LAB — MAGNESIUM: Magnesium: 1.9 mg/dL (ref 1.7–2.4)

## 2017-06-08 MED ORDER — PANTOPRAZOLE SODIUM 40 MG IV SOLR
40.0000 mg | INTRAVENOUS | Status: DC
  Administered 2017-06-08 – 2017-06-09 (×2): 40 mg via INTRAVENOUS
  Filled 2017-06-08 (×3): qty 40

## 2017-06-08 MED ORDER — FUROSEMIDE 10 MG/ML IJ SOLN
40.0000 mg | Freq: Every day | INTRAMUSCULAR | Status: DC
  Administered 2017-06-08: 40 mg via INTRAVENOUS
  Filled 2017-06-08: qty 4

## 2017-06-08 MED ORDER — HYDROCORTISONE NA SUCCINATE PF 100 MG IJ SOLR
50.0000 mg | Freq: Every day | INTRAMUSCULAR | Status: DC
  Administered 2017-06-09: 50 mg via INTRAVENOUS
  Filled 2017-06-08: qty 2
  Filled 2017-06-08: qty 1

## 2017-06-08 MED ORDER — POTASSIUM CHLORIDE 10 MEQ/50ML IV SOLN
10.0000 meq | INTRAVENOUS | Status: AC
  Administered 2017-06-08 (×4): 10 meq via INTRAVENOUS
  Filled 2017-06-08 (×4): qty 50

## 2017-06-08 MED ORDER — LACTULOSE ENEMA
300.0000 mL | Freq: Every day | ORAL | Status: DC
  Administered 2017-06-08 – 2017-06-09 (×2): 300 mL via RECTAL
  Filled 2017-06-08 (×3): qty 300

## 2017-06-08 NOTE — Progress Notes (Signed)
  Speech Language Pathology Treatment: Dysphagia  Patient Details Name: Leah Shah MRN: [id number] DOB: 1956-06-21 Today's Date: 06/08/2017 Time: 7619-5093 SLP Time Calculation (min) (ACUTE ONLY): 15 min  Assessment / Plan / Recommendation Clinical Impression  Pt seen again at the request of nursing staff, as pt has been upset about recommendations provided earlier. SLP reiterated rationale for recommendations. RN had just performed oral care, so SLP provided single ice chips with immediate throat clearing noted after ~50% of trials. Recommendations remain unchanged - NPO except for a few ice chips given only by RN after oral care pending reassessment on next date. Pt again was emotional and tearful, but after reinforcement from PA and NT, she was more accepting. Will f/u on next date as originally planned.   HPI HPI: Pt is a 61 y.o.femalewith PMH including NASH cirrhosis, chronic hepatitis, portal hypertension, and Wilson's disease. She is followed by Dr. Marius Ditch at [facility] and she is apparently being evaluated for liver transplant at [facility]. She presented 11/30 with several days of AMS and fall on day of admission. Per MD note, differential dx includes sepsis (concern for possible PNA) versus decompensated cirrhosis and organ failure in setting of noncompliance with meds, including lactulose. Pt requried intubation 12/1-12/3.      SLP Plan  Continue with current plan of care       Recommendations  Diet recommendations: NPO;Other(comment)(few ice chips given by RN after oral care) Medication Administration: Via alternative means                Oral Care Recommendations: Oral care QID Follow up Recommendations: (tba) SLP Visit Diagnosis: Dysphagia, unspecified (R13.10) Plan: Continue with current plan of care       GO                Germain Osgood 06/08/2017, 12:18 PM  Germain Osgood, M.A. CCC-SLP [phone]

## 2017-06-08 NOTE — Progress Notes (Signed)
Pt. Transferred to unit. She is alert and oriented, I agree with the previous nurses charting of assessment. Skin is intact except otherwise noted. VSS stable. Will continue to monitor.

## 2017-06-08 NOTE — Progress Notes (Signed)
Daily Rounding Note  06/08/2017, 9:35 AM  LOS: 4 days   SUBJECTIVE:   Chief complaint:     Extubated ~ noon yesterday 12/3.   SLP repeated bedside swallow this AM and still coughing with small sips of liquids and applesauce, so needs to stay NPO except ice chips until repeat study tomorrow.  SLP says her vocal quality and strength have improved overall in the near 24 hours post extubation to now.   flexiseal remains in place.    OBJECTIVE:         Vital signs in last 24 hours:    Temp:  [97.5 F (36.4 C)-98.5 F (36.9 C)] 97.7 F (36.5 C) (12/04 0809) Pulse Rate:  [72-105] 81 (12/04 0900) Resp:  [12-30] 20 (12/04 0900) BP: (98-121)/(34-86) 104/55 (12/04 0900) SpO2:  [75 %-100 %] 96 % (12/04 0900) FiO2 (%):  [40 %] 40 % (12/03 1200) Weight:  [145.8 kg (321 lb 6.9 oz)] 145.8 kg (321 lb 6.9 oz) (12/04 0410) Last BM Date: 06/07/17 Filed Weights   06/06/17 0444 06/07/17 0327 06/08/17 0410  Weight: (!) 145 kg (319 lb 10.7 oz) (!) 146.7 kg (323 lb 6.6 oz) (!) 145.8 kg (321 lb 6.9 oz)   General: looks better in terms of    Heart: RRR Chest: crackles in bases.  No dyspnea or cough.  Voice is stronger but still a bit ragged/hoarse.   Abdomen: soft, obese, NT.  BS distant,  Extremities: 3+ LE edema and mild redness to edema in LE.   Neuro/Psych:  Oriented x 3.  Appropriate.  No tremor and no asterixis.    In Lab Results: Recent Labs    06/06/17 1423 06/07/17 0300 06/08/17 0426  WBC 6.4 7.1 7.4  HGB 6.8* 8.0* 7.9*  HCT 19.2* 22.6* 22.9*  PLT 59* 63* 63*   BMET Recent Labs    06/06/17 0451 06/07/17 0515 06/08/17 0426  NA 141 141 142  K 4.2 4.0 4.1  CL 111 111 109  CO2 23 25 25   GLUCOSE 162* 137* 144*  BUN 25* 27* 26*  CREATININE 1.37* 1.10* 0.96  CALCIUM 8.6* 8.5* 8.7*   LFT Recent Labs    06/05/17 1609 06/08/17 0426  PROT 5.8* 5.8*  ALBUMIN 2.3* 2.6*  AST 47* 41  ALT 25 28  ALKPHOS 131* 95    BILITOT 8.2* 10.5*  BILIDIR  --  3.0*  IBILI  --  7.5*   PT/INR Recent Labs    06/08/17 0426  LABPROT 25.6*  INR 2.36   . albuterol  5 mg Nebulization Once  . aspirin  81 mg Oral Daily  . chlorhexidine  15 mL Mouth Rinse BID  . Chlorhexidine Gluconate Cloth  6 each Topical Daily  . furosemide  40 mg Intravenous Daily  . [START ON 06/09/2017] hydrocortisone sod succinate (SOLU-CORTEF) inj  50 mg Intravenous Daily  . lactulose  20 g Oral BID  . lactulose  300 mL Rectal Daily  . mouth rinse  15 mL Mouth Rinse q12n4p  . pantoprazole (PROTONIX) IV  40 mg Intravenous Q24H  . rifaximin  550 mg Oral BID  . sodium chloride flush  10-40 mL Intracatheter Q12H   Continuous Infusions: . sodium chloride    . piperacillin-tazobactam (ZOSYN)  IV 3.375 g (06/08/17 0630)  . 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    *  Encephalopathy, resolved. Ammonia 88 >> 48 >> 24.    lactulose, Rifaximin in place. Had been missing doses of meds (including Lactulose, Xifaxan, PPI, Aldactone, furosemide) at home.    *  Dysphagia post intubation.  SLP rec is to keep NPO for now.    *  Sepsis.  Trace pelvic ascites per CT, no abdominal ascites per ultrasound, so unlikey SBP.  ? bil multifocal PNA vs asymmetric edema per CT.   Edema per CXR.  Urine and blood clxs: no growth to date.  ? Is this due to cellulitis.  On Zosyn, Vanc.    *  Anemia.  Macrocytic.  Hgb stable.  Stools dark brown, liquid, non-melenic.  03/2017 Colonoscoy with ext rrhoids, rectal TVA.   03/2017  EGD with H Pylori + DU (treated with triple therapy), portal gastropathy, type 1 gastric varices, mild esophagitis.  Extensive ecchymosis, bruising is accounting for some of the low Hgb.   Transfused 1 U PRBC 12/2.  Hgb 6.8 >>  8.  Folate normal and B12 elevated in 08/2016 when MCV was 121.    *  Thrombocytopenia, chronic.  Splenomegaly per imaging.    *  AKI. Steadily improving.      PLAN   *  meds on hold due to NPO include lactulose, Rifaxamin, Protonix.  Will convert protonix to IV for now. Will give lactulose enemas.   Hopefully will be able to resume PO tomorrow.  If not, will need cortrack placed for meds and nutrition.      Azucena Freed  06/08/2017, 9:35 AM Pager: [phone]  ________________________________________________________________________  Velora Heckler GI MD note:  I personally examined the patient, reviewed the data and agree with the assessment and plan described above.  Hopefully she'll pass swallow evaluation tomorrow so she can take her meds orally. Massive lower extremity edema, prior to admit she was on lasix 80 daily and aldactone 100 daily. Lasix was restarted today IV 40 once daily.  Will have to follow her diuresis over the next few days, increasing lasix and aldactone as tolerated by her renal function.   Owens Loffler, MD [facility] Pager [phone]

## 2017-06-08 NOTE — Consult Note (Addendum)
   [facility] CM Inpatient Consult   06/08/2017  Leah Shah 12/09/1955 [id number]   Made aware of hospitalization by [place]. Please see chart review tab then encounters for patient outreach details by [place].  Chart reviewed. Patient is currently in ICU.  Spoke with ICU RNCM to make aware that [facility] is following.  Discussed that writer will continue to follow and engage once out of ICU.   Of note, writer is also following due to high risk for readmission in [facility] member.     Marthenia Rolling, MSN-Ed, RN,BSN [facility] Liaison [phone]

## 2017-06-08 NOTE — Evaluation (Signed)
Physical Therapy Evaluation Patient Details Name: Leah Shah MRN: [id number] DOB: 1956-06-21 Today's Date: 06/08/2017   History of Present Illness  Pt is a 61 y.o. female with PMH including NASH cirrhosis, chronic hepatitis, portal hypertension, and Wilson's disease. She is followed by Dr. Marius Ditch at [facility] and she is apparently being evaluated for liver transplant at [facility]. She presented 11/30 with several days of AMS and fall on day of admission. Per MD note, differential dx includes sepsis (concern for possible PNA) versus decompensated cirrhosis and organ failure in setting of noncompliance with meds, including lactulose. Pt requried intubation 12/1-12/3.   Clinical Impression  Pt admitted with above diagnosis. Pt currently with functional limitations due to the deficits listed below (see PT Problem List). Pt was able to stand pivot to chair with RW with min to mod assist of 2.  Should progress and be able to go home with daughter inlaw.  Will follow acutely. Pt will benefit from skilled PT to increase their independence and safety with mobility to allow discharge to the venue listed below.      Follow Up Recommendations Home health PT;Supervision/Assistance - 24 hour    Equipment Recommendations  3in1 (PT)(bariatric equipment - pt is 321 lbs.)    Recommendations for Other Services       Precautions / Restrictions Precautions Precautions: Fall Restrictions Weight Bearing Restrictions: No      Mobility  Bed Mobility Overal bed mobility: Needs Assistance Bed Mobility: Supine to Sit     Supine to sit: Mod assist;+2 for physical assistance     General bed mobility comments: assist for LEs and elevation of trunk  Transfers Overall transfer level: Needs assistance Equipment used: Rolling walker (2 wheeled) Transfers: Sit to/from [facility] Sit to Stand: Mod assist;+2 physical assistance;From elevated surface Stand pivot transfers: Min assist;Mod  assist;+2 safety/equipment       General transfer comment: Pt needed a little assist to power up but once up stand pivot with mostly min assist with RW.   Ambulation/Gait                Stairs            Wheelchair Mobility    Modified Rankin (Stroke Patients Only)       Balance Overall balance assessment: Needs assistance Sitting-balance support: No upper extremity supported;Feet supported Sitting balance-[name] Scale: Good     Standing balance support: Bilateral upper extremity supported;During functional activity Standing balance-[name] Scale: Poor Standing balance comment: relies on UE support for balance.                              Pertinent Vitals/Pain Pain Assessment: No/denies pain  VSS on 2LO2.   Home Living Family/patient expects to be discharged to:: Private residence Living Arrangements: Spouse/significant other Available Help at Discharge: Family;Available 24 hours/day(may go stay with daughter in law, husband works) Type of Home: House Home Access: Stairs to enter   Technical brewer of Steps: 2 [place]: One level Home Equipment: [place] - single point;Walker - 2 wheels;Wheelchair - Sport and exercise psychologist Comments: above info for daughter in laws home    Prior Function Level of Independence: Independent with assistive device(s)         Comments: Pt reports she was independent using SPC at baseline, able to ambulate household distances (endorses "furniture walking"). Indep with bladder mgt (endorses getting up almost every hour to use the bathroom overnight). Endorses  2 falls past 12 months     Hand Dominance        Extremity/Trunk Assessment   Upper Extremity Assessment Upper Extremity Assessment: Defer to OT evaluation    Lower Extremity Assessment Lower Extremity Assessment: Generalized weakness    Cervical / Trunk Assessment Cervical / Trunk Assessment: Kyphotic  Communication   Communication:  No difficulties  Cognition Arousal/Alertness: Awake/alert Behavior During Therapy: WFL for tasks assessed/performed Overall Cognitive Status: Within Functional Limits for tasks assessed                                        General Comments      Exercises General Exercises - Lower Extremity Ankle Circles/Pumps: AROM;Both;10 reps;Supine Long Arc Quad: AROM;Both;10 reps;Seated   Assessment/Plan    PT Assessment Patient needs continued PT services  PT Problem List Decreased activity tolerance;Decreased balance;Decreased mobility;Decreased knowledge of use of DME;Decreased safety awareness;Decreased knowledge of precautions;Cardiopulmonary status limiting activity;Pain;Decreased strength       PT Treatment Interventions DME instruction;Gait training;Functional mobility training;Therapeutic activities;Therapeutic exercise;Balance training;Patient/family education;Stair training    PT Goals (Current goals can be found in the Care Plan section)  Acute Rehab PT Goals Patient Stated Goal: to go home wtih daughter in law PT Goal Formulation: With patient Time For Goal Achievement: 06/22/17 Potential to Achieve Goals: Good    Frequency Min 3X/week   Barriers to discharge        Co-evaluation               AM-PAC PT "6 Clicks" Daily Activity  Outcome Measure Difficulty turning over in bed (including adjusting bedclothes, sheets and blankets)?: Unable Difficulty moving from lying on back to sitting on the side of the bed? : Unable Difficulty sitting down on and standing up from a chair with arms (e.g., wheelchair, bedside commode, etc,.)?: A Lot Help needed moving to and from a bed to chair (including a wheelchair)?: A Lot Help needed walking in hospital room?: Total Help needed climbing 3-5 steps with a railing? : Total 6 Click Score: 8    End of Session Equipment Utilized During Treatment: Gait belt;Oxygen Activity Tolerance: Patient limited by  fatigue Patient left: in chair;with call bell/phone within reach;with chair alarm set Nurse Communication: Mobility status;Need for lift equipment PT Visit Diagnosis: Muscle weakness (generalized) (M62.81);Unsteadiness on feet (R26.81)    Time: 1002-1020 PT Time Calculation (min) (ACUTE ONLY): 18 min   Charges:   PT Evaluation $PT Eval Moderate Complexity: 1 Mod     PT G Codes:        Leena Tiede,PT Acute Rehabilitation [phone] (pager)   Denice Paradise 06/08/2017, 12:24 PM

## 2017-06-08 NOTE — Evaluation (Signed)
Clinical/Bedside Swallow Evaluation Patient Details  Name: Leah Shah MRN: [id number] Date of Birth: 1956-01-30  Today's Date: 06/08/2017 Time: SLP Start Time (ACUTE ONLY): 7341 SLP Stop Time (ACUTE ONLY): 0942 SLP Time Calculation (min) (ACUTE ONLY): 16 min  Past Medical History:  Past Medical History:  Diagnosis Date  . Anxiety   . Arthritis    R knee, L knee - OA  . Chronic diastolic CHF (congestive heart failure) ([place])    a. 06/2016 Echo: EF 60-65%, no rwma, mod MR, mildly dil LA, nl RV fxn, PASP 52mmHg.  [name] Cirrhosis ([place])   . Dysrhythmia    "mild arrythmia after taking Redux diet med years ago"no cardio fi  . Headache(784.0)   . History of stress test    a. 06/2016 MV: EF 67%, no ischemia/infarct.  . Hyperlipidemia   . Hypertension    echoJefm Bryant, wnl 2009? , had taken Redux for diet management  but now  off the market. pt, told that echo wnl.    . Moderate mitral regurgitation    a. 06/2016 Echo: EF 60-65%, mod MR.  . Morbid obesity ([place])   . Osteoarthritis   . Recurrent upper respiratory infection (URI)    treated /w OTC med.    Past Surgical History:  Past Surgical History:  Procedure Laterality Date  . ABDOMINAL HYSTERECTOMY     partial-uterus removed  . CHOLECYSTECTOMY     2011  . COCCYX REMOVAL     [facility]  . COLONOSCOPY WITH PROPOFOL N/A 03/11/2017   Procedure: COLONOSCOPY WITH PROPOFOL;  Surgeon: Lin Landsman, MD;  Location: [place] [facility];  Service: Gastroenterology;  Laterality: N/A;  . ESOPHAGOGASTRODUODENOSCOPY (EGD) WITH PROPOFOL N/A 03/11/2017   Procedure: ESOPHAGOGASTRODUODENOSCOPY (EGD) WITH PROPOFOL;  Surgeon: Lin Landsman, MD;  Location: [facility] END[facility]COPY;  Service: Gastroenterology;  Laterality: N/A;  . EYE SURGERY     states both lens replaced. Not sure if cataract surgery or not.  [name] HEEL SPUR SURGERY    . I&D EXTREMITY  08/27/2011   Procedure: IRRIGATION AND DEBRIDEMENT EXTREMITY;  Surgeon: [name], MD;  Location: [place];  Service: Orthopedics;  Laterality: Right;  . IR TRANSCATHETER BX  05/07/2017  . JOINT REPLACEMENT Right 2012   Right TKR  . KNEE ARTHROPLASTY  07/28/2011   Procedure: COMPUTER ASSISTED TOTAL KNEE ARTHROPLASTY;  Surgeon: [name], MD;  Location: [place];  Service: Orthopedics;  Laterality: Right;  right total knee arthroplasty, computer assisted  . KNEE ARTHROSCOPY     right knee  . TMJ ARTHROPLASTY     [facility]- 1997   HPI:  Pt is a 61 y.o.femalewith PMH including NASH cirrhosis, chronic hepatitis, portal hypertension, and Wilson's disease. She is followed by Dr. Marius Ditch at [facility] and she is apparently being evaluated for liver transplant at [facility]. She presented 11/30 with several days of AMS and fall on day of admission. Per MD note, differential dx includes sepsis (concern for possible PNA) versus decompensated cirrhosis and organ failure in setting of noncompliance with meds, including lactulose. Pt requried intubation 12/1-12/3.   Assessment / Plan / Recommendation Clinical Impression  Pt has coughing associated with all consistencies tested today, as well as delayed expectoration of very small amounts of puree. Her voice is mildly hoarse and her cough appears weak, but she reports significant improvement in her vocal quality from previous date. If her vocal quality is improving so rapidly, I anticipate good prognosis for return to POs soon. However, the amount  of coughing elicited today is concerning for airway compromise, and I think it would be premature to start a diet today. Would remain NPO except for a few pieces of ice administered by RN immediately following oral care. SLP will f/u to assess for readiness to start POs versus need for instrumental testing. SLP Visit Diagnosis: Dysphagia, unspecified (R13.10)    Aspiration Risk  Moderate aspiration risk    Diet Recommendation NPO;Other (Comment)(few ice chips from RN after oral care)   Medication Administration:  Via alternative means    Other  Recommendations Oral Care Recommendations: Oral care QID Other Recommendations: Have oral suction available   Follow up Recommendations (tba)      Frequency and Duration min 2x/week  2 weeks       Prognosis Prognosis for Safe Diet Advancement: Good      Swallow Study   General HPI: Pt is a 61 y.o.femalewith PMH including NASH cirrhosis, chronic hepatitis, portal hypertension, and Wilson's disease. She is followed by Dr. Marius Ditch at [facility] and she is apparently being evaluated for liver transplant at [facility]. She presented 11/30 with several days of AMS and fall on day of admission. Per MD note, differential dx includes sepsis (concern for possible PNA) versus decompensated cirrhosis and organ failure in setting of noncompliance with meds, including lactulose. Pt requried intubation 12/1-12/3. Type of Study: Bedside Swallow Evaluation Previous Swallow Assessment: none in chart Diet Prior to this Study: NPO Temperature Spikes Noted: No Respiratory Status: Nasal cannula History of Recent Intubation: Yes Length of Intubations (days): 3 days Date extubated: 06/07/17 Behavior/Cognition: Alert;Cooperative;Pleasant mood Oral Cavity Assessment: Other (comment)(tongue discolored on pt's R side) Oral Care Completed by SLP: No Oral Cavity - Dentition: Missing dentition(says her partials are broken, needs new ones) Vision: Functional for self-feeding Self-Feeding Abilities: Able to feed self Patient Positioning: Upright in bed Baseline Vocal Quality: Hoarse;Other (comment)(high in pitch) Volitional Cough: Weak Volitional Swallow: Able to elicit    Oral/Motor/Sensory Function     Ice Chips Ice chips: Impaired Presentation: Spoon Pharyngeal Phase Impairments: Cough - Delayed   Thin Liquid Thin Liquid: Impaired Presentation: Cup;Self Fed Pharyngeal  Phase Impairments: Cough - Immediate    Nectar Thick Nectar Thick Liquid: Not tested   Honey  Thick Honey Thick Liquid: Not tested   Puree Puree: Impaired Presentation: Spoon Pharyngeal Phase Impairments: Cough - Delayed   Solid   GO   Solid: Not tested        Germain Osgood 06/08/2017,9:57 AM  Germain Osgood, M.A. CCC-SLP [phone]

## 2017-06-08 NOTE — Progress Notes (Addendum)
PULMONARY / CRITICAL CARE MEDICINE   Name: Leah Shah MRN: [id number] DOB: [birth year]    ADMISSION DATE:  05/19/2017 CONSULTATION DATE: 05/14/2017  REFERRING MD:  Eulis Foster  CHIEF COMPLAINT:  AMS  HISTORY OF PRESENT ILLNESS:  Pt is encephelopathic; therefore, this HPI is obtained from chart review. Leah Shah is a 61 y.o. female with PMH as outlined below including cirrhosis, chronic hepatitis, portal hypertension, recently diagnosed Wilson's disease (mildly elevated ceruloplasmin, 24 hr urinary copper, liver biopsy copper via transjugular liver biopsy 05/07/17, NO Beatriz Chancellor rings on ophthalmology eval).  She is followed by Dr. Marius Ditch at [facility] and she is apparently being evaluated for liver transplant at [facility]  She presented to [facility] ED 11/30 with altered mental status.  She has apparently been confused for the past several days per her daughter-in-law.  Daughter-in-law and additional family had been trying to convince her to move in with family so that they could help her with daily activities and to ensure that she would take her medications as prescribed.  Patient has apparently not been good about taking her medicines as prescribed and misses several doses on a daily basis.  Due to this, she was supposed to move in with daughter-in-law on 11/30.  When daughter-in-law called her that morning, there was no answer despite several calls.  Daughter-in-law then went to patient's home and found her on the floor sitting next to the chair.  Patient had told her that she apparently had a fall, was able to tell that she did not hit her head or lose consciousness. She had not had any recent complaints and had reportedly been in her usual state of health besides for confusion (felt to be due to missing lactulose), and decreased PO intake.  In ED, she was found to be encephalopathic, hypotensive, hypothermic.  Potassium was greater than 7.5 (she received temporizing measures) , ammonia 163,  lactate 4.13 (repeat 4.82 > 1.91).  PCCM consulted for admission  Of note, she has had extensive workup of her liver disease including normal  iron & ferritin, Hepatitis total A Ab positive, Hepatitis B surface Ag negative, Hepatitis B surface Ab mildly positive,& Hepatitis C Ab negative,HIV negative, ANA negative, AMA & anti-SMA negative, LKM ab negative, Immunoglobulins normal, A-1 antitrypsin normal, celiac serology negative.  As mentioned above, she recently had mildly elevated ceruloplasmin, 24 hr urinary copper, liver biopsy copper via transjugular liver biopsy 05/07/17, NO [facility] rings on ophthalmology eval.   SUBJECTIVE:   Failed swallow eval due to significant coughing, planning for repeat AM 12/5. Continues to do well following extubation 12/3.  VITAL SIGNS: BP (!) 104/55   Pulse 81   Temp 97.7 F (36.5 C) (Oral)   Resp 20   Ht 5\' 8"  (1.727 m)   Wt (!) 145.8 kg (321 lb 6.9 oz)   SpO2 96%   BMI 48.87 kg/m   HEMODYNAMICS:    VENTILATOR SETTINGS: FiO2 (%):  [40 %] 40 %  INTAKE / OUTPUT: I/O last 3 completed shifts: In: 1312.5 [I.V.:200; Other:400; NG/GT:125; IV Piggyback:587.5] Out: 2453 [Urine:1328; Emesis/NG output:125; Stool:1000]  PHYSICAL EXAMINATION:  Gen:      Female, no acute distress, obese HEENT:  EOMI, sclera, jaundiced Neck:      No masses; no thyromegaly, voice coarse Lungs:    Faint crackles bases CV:         Regular rate and rhythm; no murmurs Abd:      + bowel sounds; soft, non-tender; no palpable masses,  no distension Ext:    2+ edema; no deformities Skin:       Warm and dry; no rash Neuro:    Awake, responsive, follows commands  LABS:  BMET Recent Labs  Lab 06/06/17 0451 06/07/17 0515 06/08/17 0426  NA 141 141 142  K 4.2 4.0 4.1  CL 111 111 109  CO2 23 25 25   BUN 25* 27* 26*  CREATININE 1.37* 1.10* 0.96  GLUCOSE 162* 137* 144*    Electrolytes Recent Labs  Lab 06/06/17 0451 06/07/17 0515 06/08/17 0426  CALCIUM 8.6* 8.5*  8.7*  MG 2.0 1.9 1.9  PHOS 4.5 3.6 3.1    CBC Recent Labs  Lab 06/06/17 1423 06/07/17 0300 06/08/17 0426  WBC 6.4 7.1 7.4  HGB 6.8* 8.0* 7.9*  HCT 19.2* 22.6* 22.9*  PLT 59* 63* 63*    Coag's Recent Labs  Lab 05/15/2017 1541 06/05/17 0527 06/08/17 0426  INR 2.02 1.88 2.36    Sepsis Markers Recent Labs  Lab 05/11/2017 1958 06/05/17 0101 06/05/17 0527 06/05/17 1610 06/05/17 1901 06/06/17 0451  LATICACIDVEN 1.7 1.9  --  1.8 2.1*  --   PROCALCITON 0.19  --  0.17  --   --  0.13    ABG Recent Labs  Lab 05/16/2017 1841 06/05/17 0635 06/05/17 0853  PHART 7.432 7.359 7.396  PCO2ART 28.6* 37.4 35.1  PO2ART 112.0* 69.9* 409*    Liver Enzymes Recent Labs  Lab 05/07/2017 1300 06/05/17 1609 06/08/17 0426  AST 59* 47* 41  ALT 26 25 28   ALKPHOS 148* 131* 95  BILITOT 7.6* 8.2* 10.5*  ALBUMIN 2.5* 2.3* 2.6*    Cardiac Enzymes Recent Labs  Lab 06/03/2017 1956 06/05/17 0029  TROPONINI 0.15* 0.20*    Glucose Recent Labs  Lab 06/07/17 1142 06/07/17 1558 06/07/17 2019 06/08/17 0018 06/08/17 0519 06/08/17 0806  GLUCAP 130* 122* 133* 133* 130* 127*    Imaging No results found.  STUDIES:  CXR 11/30 > chronic interstitial disease. CXR 12/1> ETT in place, No acute changes Ultrasound abdomen 12/1> cirrhotic liver, minimal ascites Echocardiogram 12/1> mild LVH, LVEF 65-70%. CT chest abd/pelv 12/2 > possible multifocal PNA vs asymmetric pulmonary edema.  CULTURES: Blood 11/30 >  Urine 11/30 >   ANTIBIOTICS: Vanc 11/30 >  Zosyn 11/30 >   SIGNIFICANT EVENTS: 11/30 > admit.  LINES/TUBES: ETT 12/1 >  CVL Rt IJ 12/1> Radial a line 11/30 > 12/1  DISCUSSION: 61 y.o. female with PMH including NASH cirrhosis, chronic hepatitis, portal hypertension,Wilson's disease. She is followed by Dr. Marius Ditch at [facility] and she is apparently being evaluated for liver transplant at [facility]. She presented [facility] 11/30 with acute encephalopathy and sepsis of unclear  etiology.  She was subsequently admitted to the ICU for further eval and management.  Overall she has improved, is weaning off pressors and kidney function is better I am not convinced that this is all sepsis as pro-calcitonin was low and cultures are negative. This may be decompensated cirrhosis and organ failure in setting of non compliance with meds including lactulose. For today we will continue to wean off pressors completely and get CT of the chest abdomen pelvis to evaluate for source of infection. Continue supportive care.  ASSESSMENT / PLAN:  PULMONARY A: Acute resp failure - s/p intubation, extubated 12/3 and tolerated well P:   Bronchial hygiene  CARDIOVASCULAR A:  Sepsis - of unclear etiology, possible peritonitis in setting ascites / cirrhosis.  A repeat sepsis assessment has been performed. Troponin bump -  due to above. Hx HTN, HLD, chronic dCHF (echo from Feb 2018 with EF 38-75%, LV diastolic dysfunction, mild MR, mod LA dilation). P:  Continue stress dose steroids - weaned to 50mg  daily 12/4, can d/c 12/5 or 12/6 Continue preadmission ASA Resume furosemide, IV for now Hold preadmission atenolol, spironolactone for now, consider start AM 12/5 if passes SLP eval  RENAL A:   Hyponatremia - presumed due to hypovolemia, cirrhosis.  Resolved. Hyperkalemia - s/p temporizing measures in ED.   Resolved. NAGMA - resolved. AKI - improving. P:   Follow BMP  GASTROINTESTINAL A:   Hx NASH cirrhosis, chronic hepatitis, recent dx Wilson's disease (followed by Dr. Marius Ditch at [facility]) - reportedly being worked up for transplant at [facility]. Hyperbilirubinemia - chronic, due to above. No ascites noted on abd ultrasound Dysphagia - failed swallow eval 12/4 P:   Appreciate GI recs Continue Protonix, lactulose, rifaximin  Keep NPO for now, SLP repeat swallow study in AM 12/5  HEMATOLOGIC A:   Anemia - chronic, s/p 1u PRBC transfusion 12/2 Thrombocytopenia - chronic. VTE  Prophylaxis. P:  Transfuse for Hgb < 7 Follow CBC  INFECTIOUS A:   Sepsis - of unclear etiology, possible peritonitis in setting ascites / cirrhosis.  Ultrasound with minimal peritonea fluid making peritonitis unlikely.  Possible multifocal PNA based on CT chest.  A repeat sepsis assessment has been performed. P:   Continue vanco, zosyn Follow cultures as above PCT algorithm to limit abx exposure  ENDOCRINE A: No acute issues. P: No interventions required.  NEUROLOGIC A:   Acute encephalopathy - presumed multifactorial in setting hepatic encephalopathy + sepsis. Hx headache, anxiety. P:   Continue lactulose, rifamixin Supportive care Avoid sedating meds Hold preadmission alprazolam, citalopram, cyclobenzaprine, hydroxyzine, oxy IR, trazodone PT consult  Family updated: patients family updated at bedside.  Interdisciplinary Family Meeting v Palliative Care Meeting:  Due by: 06/10/17.  Transfer out of ICU and to med surge.  Will ask TRH to assume care starting in AM 12/5 and PCCM off at that time.   Montey Hora, [place] [facility] Pager: [phone]  or [phone] 06/08/2017, 11:14 AM  Attending Note:  61 year old female with PMH of extensive liver disease that was extubated on 12/3.  Failed swallow evaluation overnight.  On exam, distended abdomen with bibasilar crackles.  I reviewed CXR myself, pulmonary edema noted.  Discussed with PCCM-NP.  Respiratory failure:  - Monitor for airway protection  Dysphagia:  - Repeat swallow evaluation in AM  Hypoxemia:  - Titrate O2 for sat of 88-92%  - May need to arrange for home O2 upon withdrawal  Pulmonary edema:  - Lasix ordered  - Hold spirinolactone until able to take PO  Liver failure:  - Vit K  - Lactulose \ - Will need to f[facility]u with [facility] upon discharge  Transfer to med-surg and to [facility] service with PCCM off 12/5.  Patient seen and examined, agree with above note.  I  dictated the care and orders written for this patient under my direction.  Rush Farmer, [place]

## 2017-06-08 NOTE — Telephone Encounter (Signed)
Patient is in the hospital at [facility]. She had to cancel her appt with the liver transplant doctor.

## 2017-06-09 ENCOUNTER — Inpatient Hospital Stay (HOSPITAL_COMMUNITY): Payer: Medicare HMO

## 2017-06-09 DIAGNOSIS — J9600 Acute respiratory failure, unspecified whether with hypoxia or hypercapnia: Secondary | ICD-10-CM

## 2017-06-09 DIAGNOSIS — D689 Coagulation defect, unspecified: Secondary | ICD-10-CM

## 2017-06-09 DIAGNOSIS — M7989 Other specified soft tissue disorders: Secondary | ICD-10-CM

## 2017-06-09 DIAGNOSIS — J9601 Acute respiratory failure with hypoxia: Secondary | ICD-10-CM

## 2017-06-09 LAB — BLOOD GAS, ARTERIAL
Acid-Base Excess: 1.6 mmol/L (ref 0.0–2.0)
Bicarbonate: 25.3 mmol/L (ref 20.0–28.0)
Drawn by: 33100
O2 CONTENT: 3 L/min
O2 SAT: 84.8 %
PCO2 ART: 36.6 mmHg (ref 32.0–48.0)
Patient temperature: 98.1
pH, Arterial: 7.453 — ABNORMAL HIGH (ref 7.350–7.450)
pO2, Arterial: 49.1 mmHg — ABNORMAL LOW (ref 83.0–108.0)

## 2017-06-09 LAB — MAGNESIUM: MAGNESIUM: 1.6 mg/dL — AB (ref 1.7–2.4)

## 2017-06-09 LAB — BASIC METABOLIC PANEL
ANION GAP: 9 (ref 5–15)
BUN: 18 mg/dL (ref 6–20)
CALCIUM: 8.5 mg/dL — AB (ref 8.9–10.3)
CO2: 25 mmol/L (ref 22–32)
Chloride: 109 mmol/L (ref 101–111)
Creatinine, Ser: 0.8 mg/dL (ref 0.44–1.00)
Glucose, Bld: 134 mg/dL — ABNORMAL HIGH (ref 65–99)
Potassium: 3.7 mmol/L (ref 3.5–5.1)
Sodium: 143 mmol/L (ref 135–145)

## 2017-06-09 LAB — CBC
HCT: 23.9 % — ABNORMAL LOW (ref 36.0–46.0)
HEMOGLOBIN: 8.2 g/dL — AB (ref 12.0–15.0)
MCH: 39.2 pg — ABNORMAL HIGH (ref 26.0–34.0)
MCHC: 34.3 g/dL (ref 30.0–36.0)
MCV: 114.4 fL — ABNORMAL HIGH (ref 78.0–100.0)
Platelets: 65 10*3/uL — ABNORMAL LOW (ref 150–400)
RBC: 2.09 MIL/uL — AB (ref 3.87–5.11)
WBC: 11.6 10*3/uL — ABNORMAL HIGH (ref 4.0–10.5)

## 2017-06-09 LAB — GLUCOSE, CAPILLARY
Glucose-Capillary: 114 mg/dL — ABNORMAL HIGH (ref 65–99)
Glucose-Capillary: 115 mg/dL — ABNORMAL HIGH (ref 65–99)
Glucose-Capillary: 122 mg/dL — ABNORMAL HIGH (ref 65–99)
Glucose-Capillary: 125 mg/dL — ABNORMAL HIGH (ref 65–99)
Glucose-Capillary: 197 mg/dL — ABNORMAL HIGH (ref 65–99)
Glucose-Capillary: 205 mg/dL — ABNORMAL HIGH (ref 65–99)
Glucose-Capillary: 217 mg/dL — ABNORMAL HIGH (ref 65–99)

## 2017-06-09 LAB — CULTURE, BLOOD (ROUTINE X 2)
CULTURE: NO GROWTH
CULTURE: NO GROWTH
SPECIAL REQUESTS: ADEQUATE
Special Requests: ADEQUATE

## 2017-06-09 LAB — LACTIC ACID, PLASMA
LACTIC ACID, VENOUS: 4.7 mmol/L — AB (ref 0.5–1.9)
Lactic Acid, Venous: 3.7 mmol/L (ref 0.5–1.9)

## 2017-06-09 LAB — PROCALCITONIN: Procalcitonin: 0.1 ng/mL

## 2017-06-09 LAB — AMMONIA: Ammonia: 29 umol/L (ref 9–35)

## 2017-06-09 LAB — PHOSPHORUS: PHOSPHORUS: 2.9 mg/dL (ref 2.5–4.6)

## 2017-06-09 MED ORDER — FOOD THICKENER (SIMPLYTHICK)
1.0000 | Freq: Three times a day (TID) | ORAL | Status: DC
  Administered 2017-06-09 – 2017-06-10 (×4): 1 via ORAL
  Filled 2017-06-09 (×5): qty 1

## 2017-06-09 MED ORDER — SODIUM CHLORIDE 0.9 % IV SOLN
1.0000 g | Freq: Once | INTRAVENOUS | Status: AC
  Administered 2017-06-09: 1 g via INTRAVENOUS
  Filled 2017-06-09: qty 10

## 2017-06-09 MED ORDER — DILTIAZEM LOAD VIA INFUSION
10.0000 mg | Freq: Once | INTRAVENOUS | Status: AC
  Administered 2017-06-09: 10 mg via INTRAVENOUS
  Filled 2017-06-09: qty 10

## 2017-06-09 MED ORDER — DILTIAZEM HCL 60 MG PO TABS
60.0000 mg | ORAL_TABLET | Freq: Three times a day (TID) | ORAL | Status: DC
  Administered 2017-06-09 – 2017-06-12 (×8): 60 mg via ORAL
  Filled 2017-06-09 (×12): qty 1

## 2017-06-09 MED ORDER — METOPROLOL TARTRATE 5 MG/5ML IV SOLN
5.0000 mg | Freq: Four times a day (QID) | INTRAVENOUS | Status: DC
  Administered 2017-06-09 – 2017-06-12 (×13): 5 mg via INTRAVENOUS
  Filled 2017-06-09 (×17): qty 5

## 2017-06-09 MED ORDER — MAGNESIUM SULFATE 4 GM/100ML IV SOLN
4.0000 g | Freq: Once | INTRAVENOUS | Status: AC
  Administered 2017-06-09: 4 g via INTRAVENOUS
  Filled 2017-06-09: qty 100

## 2017-06-09 MED ORDER — DEXTROSE 5 % IV SOLN
2.0000 g | Freq: Three times a day (TID) | INTRAVENOUS | Status: AC
  Administered 2017-06-09 – 2017-06-12 (×11): 2 g via INTRAVENOUS
  Filled 2017-06-09 (×11): qty 2

## 2017-06-09 MED ORDER — SODIUM CHLORIDE 0.9 % IV BOLUS (SEPSIS)
500.0000 mL | Freq: Once | INTRAVENOUS | Status: AC
  Administered 2017-06-09: 500 mL via INTRAVENOUS

## 2017-06-09 MED ORDER — DILTIAZEM HCL 100 MG IV SOLR
5.0000 mg/h | INTRAVENOUS | Status: AC
  Administered 2017-06-09: 5 mg/h via INTRAVENOUS
  Administered 2017-06-09: 10 mg/h via INTRAVENOUS
  Filled 2017-06-09 (×2): qty 100

## 2017-06-09 MED ORDER — DIGOXIN 0.25 MG/ML IJ SOLN
0.5000 mg | Freq: Once | INTRAMUSCULAR | Status: AC
  Administered 2017-06-09: 0.5 mg via INTRAVENOUS
  Filled 2017-06-09: qty 2

## 2017-06-09 MED ORDER — FUROSEMIDE 10 MG/ML IJ SOLN
40.0000 mg | Freq: Two times a day (BID) | INTRAMUSCULAR | Status: DC
  Administered 2017-06-09 – 2017-06-10 (×3): 40 mg via INTRAVENOUS
  Filled 2017-06-09 (×4): qty 4

## 2017-06-09 MED ORDER — POTASSIUM CHLORIDE 10 MEQ/50ML IV SOLN
10.0000 meq | INTRAVENOUS | Status: AC
  Administered 2017-06-09 (×4): 10 meq via INTRAVENOUS
  Filled 2017-06-09 (×4): qty 50

## 2017-06-09 MED ORDER — METOPROLOL TARTRATE 5 MG/5ML IV SOLN
2.5000 mg | Freq: Once | INTRAVENOUS | Status: AC
  Administered 2017-06-09: 2.5 mg via INTRAVENOUS
  Filled 2017-06-09: qty 5

## 2017-06-09 MED ORDER — VITAMIN K1 10 MG/ML IJ SOLN
5.0000 mg | Freq: Every day | INTRAVENOUS | Status: DC
  Administered 2017-06-09 – 2017-06-10 (×2): 5 mg via INTRAVENOUS
  Filled 2017-06-09 (×3): qty 0.5

## 2017-06-09 NOTE — Progress Notes (Signed)
Daily Rounding Note  06/09/2017, 9:18 AM  LOS: 5 days   SUBJECTIVE:   Chief complaint: SOB     Transferred to ICU with increased confusion, worsening dyspnea and Afib with RVR into 160s.  Cardizem drip in place and rate now in 120s.  Acute confusion and dyspnea improved.   Fear of needing bipap or intubation has fortunately abated.   OBJECTIVE:         Vital signs in last 24 hours:    Temp:  [97.6 F (36.4 C)-98.1 F (36.7 C)] 98.1 F (36.7 C) (12/05 0449) Pulse Rate:  [79-145] 124 (12/05 0910) Resp:  [17-24] 19 (12/05 0846) BP: (88-120)/(51-83) 111/59 (12/05 0910) SpO2:  [90 %-100 %] 93 % (12/05 0846) Weight:  [142.9 kg (315 lb 0.6 oz)] 142.9 kg (315 lb 0.6 oz) (12/04 1823) Last BM Date: 06/08/17(PT.W/ FLEXISEAL) Filed Weights   06/07/17 0327 06/08/17 0410 06/08/17 1823  Weight: (!) 146.7 kg (323 lb 6.6 oz) (!) 145.8 kg (321 lb 6.9 oz) (!) 142.9 kg (315 lb 0.6 oz)   General: alert, obese, looks chronically ill.     Heart: Regular, rate in 120s.   Chest: clear.  Slight labored breathing with longer speaking Abdomen: obese, soft, NT.  Active BS  Extremities: 3+ LE edema bilaterally Heme: extensive bruising in upper torso.   Neuro/Psych:  Oriented x 3.     Lab Results: Recent Labs    06/07/17 0300 06/08/17 0426 06/09/17 0500  WBC 7.1 7.4 11.6*  HGB 8.0* 7.9* 8.2*  HCT 22.6* 22.9* 23.9*  PLT 63* 63* 65*   BMET Recent Labs    06/07/17 0515 06/08/17 0426 06/09/17 0500  NA 141 142 143  K 4.0 4.1 3.7  CL 111 109 109  CO2 25 25 25   GLUCOSE 137* 144* 134*  BUN 27* 26* 18  CREATININE 1.10* 0.96 0.80  CALCIUM 8.5* 8.7* 8.5*   LFT Recent Labs    06/08/17 0426  PROT 5.8*  ALBUMIN 2.6*  AST 41  ALT 28  ALKPHOS 95  BILITOT 10.5*  BILIDIR 3.0*  IBILI 7.5*   PT/INR Recent Labs    06/08/17 0426  LABPROT 25.6*  INR 2.36   Hepatitis Panel No results for input(s): HEPBSAG, HCVAB, HEPAIGM, HEPBIGM  in the last 72 hours.  Studies/Results: No results found.   Scheduled Meds: . albuterol  5 mg Nebulization Once  . aspirin  81 mg Oral Daily  . chlorhexidine  15 mL Mouth Rinse BID  . Chlorhexidine Gluconate Cloth  6 each Topical Daily  . hydrocortisone sod succinate (SOLU-CORTEF) inj  50 mg Intravenous Daily  . lactulose  20 g Oral BID  . lactulose  300 mL Rectal Daily  . mouth rinse  15 mL Mouth Rinse q12n4p  . pantoprazole (PROTONIX) IV  40 mg Intravenous Q24H  . rifaximin  550 mg Oral BID  . sodium chloride flush  10-40 mL Intracatheter Q12H   Continuous Infusions: . sodium chloride    . diltiazem (CARDIZEM) infusion 15 mg/hr (06/09/17 0848)  . piperacillin-tazobactam (ZOSYN)  IV 3.375 g (06/09/17 0859)  . potassium chloride 10 mEq (06/09/17 0820)  . vancomycin Stopped (06/09/17 4742)   PRN Meds:.sodium chloride, sodium chloride flush, white petrolatum   ASSESMENT:   *Cirrhosis (due to [place] ?, NASH?). Decompensated Followed by Dr Marius Ditch at [facility], Roosevelt Locks NP.   * Encephalopathy, resolved. Ammonia 88 >>48 >> 24.  lactulose,  Rifaximin in place. Had been missing doses of meds (including Lactulose, Xifaxan, PPI, Aldactone, furosemide) at home.  On PR lactulose until SLP verifies pt safe for PO.  Rifaximin to resume when safe for PO.    *  Dysphagia post intubation.  cleared for D2 diet with nectar thick liquids per SLP.    * Sepsis. Trace pelvic ascites per CT, no abdominal ascites per ultrasound, so unlikey SBP. ? bil multifocal PNA vs asymmetric edema per CT. Edema per CXR. Urine and blood clxs: no growth. ?Is this due to cellulitis?.  On Zosyn, Vanc, stress dose Solumedrol.    * Anemia. Macrocytic.Hgb stable.  Stools dark brown, liquid, non-melenic. 03/2017 Colonoscopy with ext rrhoids, rectal TVA. 03/2017 EGD with H Pylori + DU (treated with triple therapy), portal gastropathy, type 1 gastric varices,  mild esophagitis. Extensive ecchymosis, bruising is accounting for some of the low Hgb. Transfused 1 U PRBC 12/2. Hgb 6.8 >>8 >> 8.2. Folate normal and B12 elevated in 08/2016 when MCV was 121.   * coagulopathy.    * Thrombocytopenia, chronic. Splenomegaly per imaging.   * AKI. resolved.        PLAN   *  3 days of IV Vitamin K, see if coags improve.   *  D2 diet with nectar thick liquids.  Can resume po meds.    *  Will need seen at [facility] hepatology clinic within 2 weeks of discharge.  Made appt with Roosevelt Locks, the liver NP for 12/17 at 10:30 AM. Pt had to cxl the appt set for 12/4.    [phone].      Azucena Freed  06/09/2017, 9:18 AM Pager: [phone]  ________________________________________________________________________  Velora Heckler GI MD note:  I personally examined the patient, reviewed the data and agree with the assessment and plan described above.  Agree with the above.  Currently getting lasix 40mg  IV twice daily.  With normal Cr, can probably increase her diuretics (recommend 80 IV BID lasix and also aldactone 200mg  once daily) but I'll leave that to CCM.  Agree with Vit K and we have ordered that. Will continue to follow.   Owens Loffler, MD [facility] Pager [phone]

## 2017-06-09 NOTE — Progress Notes (Signed)
eLink Physician-Brief Progress Note Patient Name: Leah Shah DOB: Feb 12, 1956 MRN: [id number]   Date of Service  06/09/2017  HPI/Events of Note  Elevated lactic acid noted. Likely due to earlier atrial fibrillation and rapid ventricular response. Poor clearance due to baseline liver dysfunction. Currently normotensive. Normal heart rate on telemetry.   eICU Interventions  1. Continuing to trend lactic acid every 6 hours 2. Maintaining normotension      Intervention Category Major Interventions: Other:  Tera Partridge 06/09/2017, 8:16 PM

## 2017-06-09 NOTE — Significant Event (Signed)
Rapid Response Event Note RN called for HR 130-140 Overview: Time Called: 0441 Arrival Time: 0175 Event Type: Cardiac  Initial Focused Assessment: RN called for acute onset HR 130-140's. EKG resulted A-Fib with RVR. Pt has no history of the same. She denies any CP or SOB. Alert, oriented x4. Skin warm and dry, no distress. Pt refused am labs with IV team. I asked why she refused labs, she replied, "I'm tired, I've been up all night watching TV."  SBP 106, HR 139, RR 18, 91 2L [place]  Dr. Ashok Cordia called, new orders 500 cc NS Bolus completed. Called Dr. Ashok Cordia due to no change, 2.5 mg Lopressor IVP completed. HR 145, BP 110/66, 90% 2L [place], RR 20 Interventions: EKG-A-Fib w/RVR  500cc NS Bolus 2.5 mg Lopressor IVP  Plan of Care (if not transferred): Eddie Dibbles NP at bedside to evaluate pt  Event Summary: Name of Physician Notified: Dr. Ashok Cordia  at 0500    at    Outcome: Stayed in room and stabalized     [place], [place]

## 2017-06-09 NOTE — Progress Notes (Signed)
Nutrition Follow-up  DOCUMENTATION CODES:   Morbid obesity  INTERVENTION:    Magic cup TID with meals, each supplement provides 290 kcal and 9 grams of protein  Hormel Shake BID with meals, each supplement provides 480-500 kcals and 20-23 grams of protein  NUTRITION DIAGNOSIS:   Inadequate oral intake related to acute illness, dysphagia as evidenced by (diet just advanced 12/5).  Ongoing  GOAL:   Patient will meet greater than or equal to 90% of their needs  Unmet  MONITOR:   PO intake, Supplement acceptance, Diet advancement, Labs, I & O's  ASSESSMENT:   61 y.o. female with PMH as outlined below including cirrhosis, chronic hepatitis, portal hypertension, recently diagnosed Wilson's disease (mildly elevated ceruloplasmin, 24 hr urinary copper, liver biopsy copper via transjugular liver biopsy 05/07/17, NO Kaiser Fleischer rings on ophthalmology eval).  She is followed by Dr. Marius Ditch at [facility] and she is apparently being evaluated for liver transplant at [facility]. She presented [facility] 11/30 with acute encephalopathy and sepsis of unclear etiology.  She was subsequently admitted to the ICU for further eval and management.  Patient was extubated on 12/3. Diet was advanced to dysphagia 2 with nectar thick liquids today s/p SLP evaluation. Suspect intake will be poor due to dysphagia and restrictive consistency diet. Labs and medications reviewed.   Diet Order:  DIET DYS 2 Room service appropriate? Yes; Fluid consistency: Nectar Thick  EDUCATION NEEDS:   No education needs have been identified at this time  Skin:  Skin Assessment: Reviewed RN Assessment  Last BM:  12/5 (rectal tube)  Height:   Ht Readings from Last 1 Encounters:  06/08/17 5\' 8"  (1.727 m)    Weight:   Wt Readings from Last 1 Encounters:  06/08/17 (!) 315 lb 0.6 oz (142.9 kg)    Ideal Body Weight:  63.64 kg  BMI:  Body mass index is 47.9 kg/m.  Estimated Nutritional Needs:   Kcal:   1900-2100  Protein:  125 gm  Fluid:  2 L   Molli Barrows, RD, LDN, CNSC Pager [phone] After Hours Pager [phone]

## 2017-06-09 NOTE — Progress Notes (Signed)
PULMONARY / CRITICAL CARE MEDICINE   Name: Leah Shah MRN: [id number] DOB: 04-Oct-1955    ADMISSION DATE:  05/22/2017 CONSULTATION DATE: 05/13/2017  REFERRING MD:  Eulis Foster  CHIEF COMPLAINT:  AMS  HISTORY OF PRESENT ILLNESS:  Pt is encephelopathic; therefore, this HPI is obtained from chart review. Leah Shah is a 61 y.o. female with PMH as outlined below including cirrhosis, chronic hepatitis, portal hypertension, recently diagnosed Wilson's disease (mildly elevated ceruloplasmin, 24 hr urinary copper, liver biopsy copper via transjugular liver biopsy 05/07/17, NO Beatriz Chancellor rings on ophthalmology eval).  She is followed by Dr. Marius Ditch at [facility] and she is apparently being evaluated for liver transplant at [facility]  She presented to [facility] ED 11/30 with altered mental status.  She has apparently been confused for the past several days per her daughter-in-law.  Daughter-in-law and additional family had been trying to convince her to move in with family so that they could help her with daily activities and to ensure that she would take her medications as prescribed.  Patient has apparently not been good about taking her medicines as prescribed and misses several doses on a daily basis.  Due to this, she was supposed to move in with daughter-in-law on 11/30.  When daughter-in-law called her that morning, there was no answer despite several calls.  Daughter-in-law then went to patient's home and found her on the floor sitting next to the chair.  Patient had told her that she apparently had a fall, was able to tell that she did not hit her head or lose consciousness. She had not had any recent complaints and had reportedly been in her usual state of health besides for confusion (felt to be due to missing lactulose), and decreased PO intake.  In ED, she was found to be encephalopathic, hypotensive, hypothermic.  Potassium was greater than 7.5 (she received temporizing measures) , ammonia 163,  lactate 4.13 (repeat 4.82 > 1.91).  PCCM consulted for admission  Of note, she has had extensive workup of her liver disease including normal  iron & ferritin, Hepatitis total A Ab positive, Hepatitis B surface Ag negative, Hepatitis B surface Ab mildly positive,& Hepatitis C Ab negative,HIV negative, ANA negative, AMA & anti-SMA negative, LKM ab negative, Immunoglobulins normal, A-1 antitrypsin normal, celiac serology negative.  As mentioned above, she recently had mildly elevated ceruloplasmin, 24 hr urinary copper, liver biopsy copper via transjugular liver biopsy 05/07/17, NO Kaiser Fleischer rings on ophthalmology eval.   SUBJECTIVE:   On 06/09/2017 while on progressive care unit on 5 W. developed atrial fib with rapid ventricular response, increased work of breathing and is being transferred back to intensive care unit.  VITAL SIGNS: BP (!) 103/51   Pulse (!) 135   Temp 98.1 F (36.7 C) (Oral)   Resp 18   Ht 5\' 8"  (1.727 m)   Wt (!) 315 lb 0.6 oz (142.9 kg)   SpO2 96%   BMI 47.90 kg/m   HEMODYNAMICS:    VENTILATOR SETTINGS:    INTAKE / OUTPUT: I/O last 3 completed shifts: In: 47 [I.V.:110; IV Piggyback:400] Out: 2725 [Urine:2525; Stool:200]  PHYSICAL EXAMINATION:  General: Morbidly obese female in moderate respiratory distress. HEENT: Right IJ central venous line in position.  No JVD is appreciated PSY: Anxious  neuro: Intact CV: Heart sounds are regular regular with a ventricular rate of 154 PULM: Decreased air movement, dyspnea with any exertion, bibasilar crackles. GI: Obese, tender Extremities: 3+ edema, with multiple ecchymotic areas, multiple areas of  skin breakdown, Skin: Warm to touch   LABS:  BMET Recent Labs  Lab 06/07/17 0515 06/08/17 0426 06/09/17 0500  NA 141 142 143  K 4.0 4.1 3.7  CL 111 109 109  CO2 25 25 25   BUN 27* 26* 18  CREATININE 1.10* 0.96 0.80  GLUCOSE 137* 144* 134*    Electrolytes Recent Labs  Lab 06/07/17 0515  06/08/17 0426 06/09/17 0500  CALCIUM 8.5* 8.7* 8.5*  MG 1.9 1.9 1.6*  PHOS 3.6 3.1 2.9    CBC Recent Labs  Lab 06/07/17 0300 06/08/17 0426 06/09/17 0500  WBC 7.1 7.4 11.6*  HGB 8.0* 7.9* 8.2*  HCT 22.6* 22.9* 23.9*  PLT 63* 63* 65*    Coag's Recent Labs  Lab 05/31/2017 1541 06/05/17 0527 06/08/17 0426  INR 2.02 1.88 2.36    Sepsis Markers Recent Labs  Lab 06/02/2017 1958 06/05/17 0101 06/05/17 0527 06/05/17 1610 06/05/17 1901 06/06/17 0451  LATICACIDVEN 1.7 1.9  --  1.8 2.1*  --   PROCALCITON 0.19  --  0.17  --   --  0.13    ABG Recent Labs  Lab 05/20/2017 1841 06/05/17 0635 06/05/17 0853  PHART 7.432 7.359 7.396  PCO2ART 28.6* 37.4 35.1  PO2ART 112.0* 69.9* 409*    Liver Enzymes Recent Labs  Lab 05/27/2017 1300 06/05/17 1609 06/08/17 0426  AST 59* 47* 41  ALT 26 25 28   ALKPHOS 148* 131* 95  BILITOT 7.6* 8.2* 10.5*  ALBUMIN 2.5* 2.3* 2.6*    Cardiac Enzymes Recent Labs  Lab 05/25/2017 1956 06/05/17 0029  TROPONINI 0.15* 0.20*    Glucose Recent Labs  Lab 06/08/17 1534 06/08/17 1730 06/08/17 2012 06/09/17 0015 06/09/17 0421 06/09/17 0748  GLUCAP 125* 122* 108* 114* 125* 115*    Imaging No results found.  STUDIES:  CXR 11/30 > chronic interstitial disease. CXR 12/1> ETT in place, No acute changes Ultrasound abdomen 12/1> cirrhotic liver, minimal ascites Echocardiogram 12/1> mild LVH, LVEF 65-70%. CT chest abd/pelv 12/2 > possible multifocal PNA vs asymmetric pulmonary edema. 06/09/2017 chest x-ray reveals frank pulmonary edema.  CULTURES:no growth to date on 06/09/2017>> Blood 11/30 >  Urine 11/30 > negative  ANTIBIOTICS: Vanc 11/30 >  Zosyn 11/30 >   SIGNIFICANT EVENTS: 11/30 > admit.  LINES/TUBES: ETT 12/1 > 12/3 CVL Rt IJ 12/1> Radial a line 11/30 > 12/1  DISCUSSION: 61 y.o. female with PMH including NASH cirrhosis, chronic hepatitis, portal hypertension,Wilson's disease. She is followed by Dr. Marius Ditch at [facility] and she is apparently being evaluated for liver transplant at [facility]. She presented [facility] 11/30 with acute encephalopathy and sepsis of unclear etiology.  She was subsequently admitted to the ICU for further eval and management.  Transferred to the floor on 06/08/2017 and during the early a.m. of 06/09/2017 developed atrial fibrillation rapid ventricular response along with hypotension.  She was placed on diltiazem drip with some improvement in her heart rate.  She is dyspneic with any exertion.  Stat portable chest x ray revealed frank pulmonary edema.  Due to her multiple medical problems, morbid obesity, pulmonary edema with recent extubation on 06/07/2017, she will be returned to the intensive care for further evaluation and treatment.  She may need noninvasive mechanical ventilatory support and/or intubation.  ASSESSMENT / PLAN:  PULMONARY A: Acute resp failure - s/p intubation, extubated 12/3 and tolerated well 06/08/2017 transferred to the floor and developed atrial fibrillation ventricular response along with increased work of breathing P:   Bronchial hygiene O2 as needed  May need noninvasive mechanical ventilatory support and possibly intubation.  CARDIOVASCULAR A:  Sepsis - of unclear etiology, possible peritonitis in setting ascites / cirrhosis.  A repeat sepsis assessment has been performed. Troponin bump - due to above. Hx HTN, HLD, chronic dCHF (echo from Feb 2018 with EF 16-01%, LV diastolic dysfunction, mild MR, mod LA dilation). P:  Continue stress dose steroids - weaned to 50mg  daily 12/4, can d/c 12/5 or 12/6 Continue preadmission ASA Note she was a -1.8 L for 06/09/2017, with atrial fib rapid ventricular response, given IV fluid boluses.  Therefore no Lasix on 06/09/2017. Hold preadmission atenolol, spironolactone for now, consider start AM 12/5 if passes SLP eval  RENAL Lab Results  Component Value Date   CREATININE 0.80 06/09/2017   CREATININE 0.96  06/08/2017   CREATININE 1.10 (H) 06/07/2017   CREATININE 0.82 01/07/2017   CREATININE 0.72 02/24/2016   CREATININE 0.54 (L) 11/11/2012   Recent Labs  Lab 06/07/17 0515 06/08/17 0426 06/09/17 0500  K 4.0 4.1 3.7   Recent Labs  Lab 06/07/17 0515 06/08/17 0426 06/09/17 0500  NA 141 142 143     A:   Hyponatremia - presumed due to hypovolemia, cirrhosis.  Resolved. Hyperkalemia - s/p temporizing measures in ED.   Resolved. NAGMA - resolved. AKI - improving. Hypo-magnesium P:   Follow BMP Magnesium repleted  GASTROINTESTINAL A:   Hx NASH cirrhosis, chronic hepatitis, recent dx Wilson's disease (followed by Dr. Marius Ditch at [facility]) - reportedly being worked up for transplant at [facility]. Hyperbilirubinemia - chronic, due to above. No ascites noted on abd ultrasound Dysphagia - failed swallow eval 12/4 P:   Appreciate GI recs Continue Protonix, lactulose, rifaximin  Keep NPO for now, SLP repeat swallow study in AM 12/5.  Due to her new onset of A. fib rapid ventricular response, swallow eval may be on hold.  HEMATOLOGIC Recent Labs    06/08/17 0426 06/09/17 0500  HGB 7.9* 8.2*    A:   Anemia - chronic, s/p 1u PRBC transfusion 12/2 Thrombocytopenia - chronic. VTE Prophylaxis. P:  Transfuse for Hgb < 7 Follow CBC  INFECTIOUS A:   Sepsis - of unclear etiology, possible peritonitis in setting ascites / cirrhosis.  Ultrasound with minimal peritonea fluid making peritonitis unlikely.  Possible multifocal PNA based on CT chest.  A repeat sepsis assessment has been performed. P:   Continue vanco, zosyn Follow cultures as above   ENDOCRINE CBG (last 3)  Recent Labs    06/09/17 0015 06/09/17 0421 06/09/17 0748  GLUCAP 114* 125* 115*    A: No acute issues. P: No interventions required.  NEUROLOGIC A:   Acute encephalopathy - presumed multifactorial in setting hepatic.  Soft encephalopathy + sepsis. Hx headache, anxiety. P:   Continue lactulose,  rifamixin Supportive care Avoid sedating meds Hold preadmission alprazolam, citalopram, cyclobenzaprine, hydroxyzine, oxy IR, trazodone PT consult  Family updated: 06/09/2017 no family at bedside.  Patient updated at bedside about the need to return to intensive care unit.  Interdisciplinary Family Meeting v Palliative Care Meeting:  Due by: 06/10/17. App CCT 30 min  Richardson Landry Minor ACNP Maryanna Shape PCCM Pager [phone] till 1 pm If no answer page [phone] 06/09/2017, 8:13 AM   STAFF NOTE: I, Merrie Roof, MD FACP have personally reviewed patient's available data, including medical history, events of note, physical examination and test results as part of my evaluation. I have discussed with resident/NP and other care providers such as pharmacist, RN and RRT. In addition,  I personally evaluated patient and elicited key findings of: awake, alert,mild distress, jvd evident on exam, crackles bilateral, bruising arms and legs, abdo soft, no liver border, tachy IRR distant, HR 126-136, pcxr which I reviewed shows worsening bilateral int edema likely  / infiltrates, I reviewed all past pcxr and her CT which shows liver cirrhosis andportal htn, the CT chest showed these annular areas bilateral which could be secondary to PNA, but I favor this as edema, she has 4 plus edema , severe gross overload, overnight now with fib rvr and distress, we must maintain controled rate, increase dilt, add schedules BB IV  As BP is good now, add dig x1 relcutant to use amio at this stage with liver dz, lasix add, to again goal neg 1.3 liters which she was the last 24 hours, follow crt closely, supp K on going with active diuresis, will assess pct, lactic and change zosyn to ceftaz as she is declining on this agent, continued vanc, move to icu, I also discussed her wishes to have reintubation for which she wold want at this stage, steroids to maintain but may reduce, did not have sig ascites to sample, I updated th pr in  full, extensive discussions as above, VBG assess , appears that aBG is likely good arterial PH The patient is critically ill with multiple organ systems failure and requires high complexity decision making for assessment and support, frequent evaluation and titration of therapies, application of advanced monitoring technologies and extensive interpretation of multiple databases.   Critical Care Time devoted to patient care services described in this note is 30 Minutes. This time reflects time of care of this signee: Merrie Roof, MD FACP. This critical care time does not reflect procedure time, or teaching time or supervisory time of PA/NP/Med student/Med Resident etc but could involve care discussion time. Rest per NP/medical resident whose note is outlined above and that I agree with   Lavon Paganini. Titus Mould, MD, [place] Pgr: [facility] 06/09/2017 9:14 AM

## 2017-06-09 NOTE — Progress Notes (Signed)
*  PRELIMINARY RESULTS* Vascular Ultrasound Bilateral Upper and Lower extremity venous duplex has been completed.  Preliminary findings: the visualized deep veins of all four extremities appear negative for deep vein thrombosis.  Extremely limited exams due to patient body habitus, penetration, poor positioning due to mobility and edema.  Small segment of superficial vein thrombosis in the left cephalic vein near patients IV.  Everrett Coombe 06/09/2017, 1:46 PM

## 2017-06-09 NOTE — Progress Notes (Signed)
NS 500 cc bolus given as ordered.

## 2017-06-09 NOTE — Progress Notes (Signed)
SLP Cancellation Note  Patient Details Name: Leah Shah MRN: [id number] DOB: 09/12/1955   Cancelled treatment:       Reason Eval/Treat Not Completed: Patient at procedure or test/unavailable . Pt recently transferred to 2M10 due to increased HR. Nursing currently getting pt settled, and requested SLP return later. Will continue efforts.   Rawleigh Rode B. Quentin Ore [facility], CCC-SLP Speech Language Pathologist [phone]  Shonna Chock 06/09/2017, 10:22 AM

## 2017-06-09 NOTE — Progress Notes (Signed)
Critical value Lactic acid 4.7 reported to Dr. Ashok Cordia.

## 2017-06-09 NOTE — Evaluation (Signed)
Clinical/Bedside Swallow Reassessment/Swallowing Treatment Patient Details  Name: Leah Shah MRN: [id number] Date of Birth: 1956/04/09  Today's Date: 06/09/2017 Time: SLP Start Time (ACUTE ONLY): 1125 SLP Stop Time (ACUTE ONLY): 1200 SLP Time Calculation (min) (ACUTE ONLY): 35 min  Past Medical History:  Past Medical History:  Diagnosis Date  . Anxiety   . Arthritis    R knee, L knee - OA  . Chronic diastolic CHF (congestive heart failure) ([place])    a. 06/2016 Echo: EF 60-65%, no rwma, mod MR, mildly dil LA, nl RV fxn, PASP 76mmHg.  [name] Cirrhosis ([place])   . Dysrhythmia    "mild arrythmia after taking Redux diet med years ago"no cardio fi  . Headache(784.0)   . History of stress test    a. 06/2016 MV: EF 67%, no ischemia/infarct.  . Hyperlipidemia   . Hypertension    echoJefm Bryant, wnl 2009? , had taken Redux for diet management  but now  off the market. pt, told that echo wnl.    . Moderate mitral regurgitation    a. 06/2016 Echo: EF 60-65%, mod MR.  . Morbid obesity ([place])   . Osteoarthritis   . Recurrent upper respiratory infection (URI)    treated /w OTC med.    Past Surgical History:  Past Surgical History:  Procedure Laterality Date  . ABDOMINAL HYSTERECTOMY     partial-uterus removed  . CHOLECYSTECTOMY     2011  . COCCYX REMOVAL     [facility]  . COLONOSCOPY WITH PROPOFOL N/A 03/11/2017   Procedure: COLONOSCOPY WITH PROPOFOL;  Surgeon: Lin Landsman, MD;  Location: [facility] ENDOSCOPY;  Service: Gastroenterology;  Laterality: N/A;  . ESOPHAGOGASTRODUODENOSCOPY (EGD) WITH PROPOFOL N/A 03/11/2017   Procedure: ESOPHAGOGASTRODUODENOSCOPY (EGD) WITH PROPOFOL;  Surgeon: Lin Landsman, MD;  Location: [facility] ENDOSCOPY;  Service: Gastroenterology;  Laterality: N/A;  . EYE SURGERY     states both lens replaced. Not sure if cataract surgery or not.  [name] HEEL SPUR SURGERY    . I&D EXTREMITY  08/27/2011   Procedure: IRRIGATION AND DEBRIDEMENT EXTREMITY;  Surgeon: [name], MD;  Location: [place];  Service: Orthopedics;  Laterality: Right;  . IR TRANSCATHETER BX  05/07/2017  . JOINT REPLACEMENT Right 2012   Right TKR  . KNEE ARTHROPLASTY  07/28/2011   Procedure: COMPUTER ASSISTED TOTAL KNEE ARTHROPLASTY;  Surgeon: [name], MD;  Location: [place];  Service: Orthopedics;  Laterality: Right;  right total knee arthroplasty, computer assisted  . KNEE ARTHROSCOPY     right knee  . TMJ ARTHROPLASTY     [facility]- 1997   HPI:  Pt is a 61 y.o.femalewith PMH including NASH cirrhosis, chronic hepatitis, portal hypertension, and Wilson's disease. She is followed by Dr. Marius Ditch at [facility] and she is apparently being evaluated for liver transplant at [facility]. She presented 11/30 with several days of AMS and fall on day of admission. Per MD note, differential dx includes sepsis (concern for possible PNA) versus decompensated cirrhosis and organ failure in setting of noncompliance with meds, including lactulose. Pt requried intubation 12/1-12/3.   Assessment / Plan / Recommendation Clinical Impression  Pt seen at bedside for po trials following failed BSE completed yesterday. Based on pt presentation, report, and documentation, pt exhibits improvement today. Clear voice quality noted, with improved strength of volitional cough. Pt noted to clear throat intermittently, however, daughter in law indicated this is baseline behavior for pt. Pt exhibited more consistent throat clearing following trials of thin liquid,  and appeared to tolerate nectar thick liquids without s/s aspiration. Pt tolerated trials of puree and solid without overt difficulty. At this time, recommend beginning conservative diet of dys 2 (for energy conservation and poor dentition) and nectar thick liquids, to mitigate aspiration risk. ST will continue to follow pt for assessment of diet tolerance and to provide education. RN and MD informed of results and recommendations. Safe swallow precautions  posted at [facility]. SLP Visit Diagnosis: Dysphagia, unspecified (R13.10)    Aspiration Risk  Moderate aspiration risk;Mild aspiration risk    Diet Recommendation Dysphagia 2 (Fine chop);Nectar-thick liquid   Liquid Administration via: Cup;Straw Medication Administration: Whole meds with liquid(1 at a time) Supervision: Patient able to self feed;Full supervision/cueing for compensatory strategies Compensations: Minimize environmental distractions;Slow rate;Small sips/bites Postural Changes: Seated upright at 90 degrees;Remain upright for at least 30 minutes after po intake    Other  Recommendations Oral Care Recommendations: Oral care QID Other Recommendations: Have oral suction available;Order thickener from pharmacy   Follow up Recommendations (TBD)      Frequency and Duration min 2x/week  1 week;2 weeks       Prognosis Prognosis for Safe Diet Advancement: Good      Swallow Study   General Date of Onset: 05/07/2017 HPI: Pt is a 61 y.o.femalewith PMH including NASH cirrhosis, chronic hepatitis, portal hypertension, and Wilson's disease. She is followed by Dr. Marius Ditch at [facility] and she is apparently being evaluated for liver transplant at [facility]. She presented 11/30 with several days of AMS and fall on day of admission. Per MD note, differential dx includes sepsis (concern for possible PNA) versus decompensated cirrhosis and organ failure in setting of noncompliance with meds, including lactulose. Pt requried intubation 12/1-12/3. Type of Study: Bedside Swallow Evaluation Previous Swallow Assessment: BSE 06/08/17 - rec NPO Diet Prior to this Study: NPO Temperature Spikes Noted: No Respiratory Status: Nasal cannula History of Recent Intubation: Yes Length of Intubations (days): 3 days Date extubated: 06/07/17 Behavior/Cognition: Alert;Cooperative;Pleasant mood Oral Care Completed by SLP: Recent completion by staff Oral Cavity - Dentition: Missing dentition Vision:  Functional for self-feeding Self-Feeding Abilities: Able to feed self Patient Positioning: Upright in bed Baseline Vocal Quality: Normal Volitional Cough: Strong Volitional Swallow: Able to elicit    Oral/Motor/Sensory Function Overall Oral Motor/Sensory Function: Within functional limits   Ice Chips Ice chips: Within functional limits Presentation: Spoon   Thin Liquid Thin Liquid: Impaired Presentation: Cup;Self Fed;Straw Pharyngeal  Phase Impairments: Cough - Delayed    Nectar Thick Nectar Thick Liquid: Within functional limits Presentation: Cup;Straw;Self Fed   Honey Thick Honey Thick Liquid: Not tested   Puree Puree: Within functional limits Presentation: Spoon;Self Fed   Solid   GO   Solid: Within functional limits Presentation: [place] B. Quentin Ore, [facility], [place] Speech Language Pathologist [phone]  Shonna Chock 06/09/2017,12:15 PM

## 2017-06-09 NOTE — Progress Notes (Signed)
Pt arrived on the unit around 10 am on O2 4L, alert and oriented. At times speaking nonsense, and per RN prior states "patient had hallucinations". Pt agrees that she saw "some people in the closet". Bed in low position, alarms are on, call bell in reach.    Throughout the day, pt is anxious, but cooperative. Family at the bedside. Denies pain.  Cardizem drip titrated down  Lactic Acid of 3.7 was reported to MD.

## 2017-06-09 NOTE — Progress Notes (Signed)
B/P rechecked;110/66;HR=145;DR.Ashok Cordia was called & made him aware & ordered to give Metoprolol 2.5 mg iv.

## 2017-06-09 NOTE — Progress Notes (Signed)
eLink Physician-Brief Progress Note Patient Name: Leah Shah DOB: April 28, 1956 MRN: [id number]   Date of Service  06/09/2017  HPI/Events of Note  Notified of acute onset atrial fibrillation with rapid ventricular response. No history of atrial fibrillation but dysrhythmia is documented in past medical history. Patient refused labs this morning. BP 108/52. Patient reportedly is mentating.   eICU Interventions  1. Continuing telemetry monitoring 2. Normal saline 500 mL bolus now 3. Checking stat electrolytes      Intervention Category Major Interventions: Arrhythmia - evaluation and management  Tera Partridge 06/09/2017, 5:01 AM

## 2017-06-09 NOTE — Progress Notes (Signed)
NP Hoffman ordered to transfer to stepdown & to start on Cardizem drip.Will notify Magda Paganini for the transfer to stepdown.Arcola Jansky RN was also made aware .Will continue to monitor pt.

## 2017-06-09 NOTE — Progress Notes (Signed)
PCCM INTERVAL PROGRESS NOTE  Called to bedside by [facility] MD to evaluate patient with new rapid heart rate. Atrial fibrillation by tele and EKG. Patient complains of headache only. Appears to have increased WOB, but denies any dyspnea. She was on 2L O2, but increased to 3L by RN during this episode with no change. Prior ELINK interventions include 500cc bolus and 2.5 mg metoprolol IV push without improvement.   BP 110/66 (BP Location: Right Arm)   Pulse (!) 145   Temp 98.1 F (36.7 C) (Oral)   Resp 18   Ht 5\' 8"  (1.727 m)   Wt (!) 142.9 kg (315 lb 0.6 oz)   SpO2 90%   BMI 47.90 kg/m   Plan: New onset atrial fibrillation with rapid ventricular response.  - Initiate diltiazem infusion with bolus of only 10mg  considering her borderline BP - Unable to give amiodarone in the setting of hepatic failure - Will need to transfer to SDU as current PCU unable to accommodate diltiazem infusion per RN.  - Defer anticoagulation for now as this is hopefully and isolated incident. Would need if this persists up to 48 hours. CHADS-VASc score 3.  Georgann Housekeeper, AGACNP-BC [facility] Pulmonology/Critical Care Pager [phone] or [phone]  06/09/2017 6:17 AM

## 2017-06-09 NOTE — Progress Notes (Signed)
B/P rechecked=92/63;NP Hoffman came to see pt.& made him aware of B/P.

## 2017-06-09 NOTE — Plan of Care (Signed)
  Completed/Met SLP Dysphagia Goals Patient will demonstrate readiness for PO's Description Patient will demonstrate readiness for PO's and/or instrumental swallow study as evidenced by: 06/09/2017 1158 - Completed/Met by Colon Flattery B, CCC-SLP   Goals established 06/09/17 SLP Dysphagia Goals Patient will utilize recommended strategies Description Patient will utilize recommended strategies during swallow to increase swallowing safety with 06/09/2017 1158 by Colon Flattery B, CCC-SLP Flowsheets Taken 06/09/2017 1158  Patient will utilize recommended strategies during swallow to increase swallowing safety with  min assist Misc Dysphagia Goal 06/09/2017 1159 by Colon Flattery B, CCC-SLP Flowsheets Taken 06/09/2017 1159  Misc Dysphagia Goal  Pt will tolerate least restrictive diet without overt s/s aspiration or decline in respiratory status

## 2017-06-09 NOTE — Progress Notes (Signed)
eLink Physician-Brief Progress Note Patient Name: Leah Shah DOB: Apr 12, 1956 MRN: [id number]   Date of Service  06/09/2017  HPI/Events of Note  Transferring patient to stepdown unit bed. Diltiazem infusion ordered. Morning labs noted with potassium 3.7 and magnesium 1.6. Has Central IV access.   eICU Interventions  1. Continuing current plan of care. 2. Calcium gluconate 1 g IV 3. Magnesium sulfate for gram IV 4. KCl 10 mEq IV 4 runs ordered      Intervention Category Major Interventions: Arrhythmia - evaluation and management;Electrolyte abnormality - evaluation and management  Tera Partridge 06/09/2017, 6:37 AM

## 2017-06-09 NOTE — Progress Notes (Signed)
eLink Physician-Brief Progress Note Patient Name: [name] DOB: January 29, 1956 MRN: [id number]   Date of Service  06/09/2017  HPI/Events of Note  Still with rapid ventricular response. BP remained stable. Labs pending.   eICU Interventions  Lopressor 2.5 mg IV 1 ordered      Intervention Category Major Interventions: Arrhythmia - evaluation and management  Tera Partridge 06/09/2017, 5:31 AM

## 2017-06-09 NOTE — Progress Notes (Signed)
Report called to Leah Shah. immediatley prior transfer swat nurse noticed some confusion and possible hallucination. Patient at this point on assessment was alert and orient x3. Incorrectly says date 1918. Also says lady across hall referring to another patient came into her room overnight with stethoscope and logged in and said was taking care of her along with night shift nurse. MD paged as far as possible hallucination and let know patient is on way to 42M10. I also called Verdis Frederickson, RN who is nurse receiving patient and notified her and let her know MD was paged

## 2017-06-09 NOTE — Progress Notes (Signed)
Pt's HR went up to 143;checked pt.& pt.was sleeping.V/Staken T=98.1;HR=139;R=18;B/P=106/60;O2 SAT =91% on 2lnc.Daneil Dan nurse was called & EKG done ,& she came to see pt.Dr.Nestor was called & made him aware & ordered togive 500cc.NS bolus.

## 2017-06-10 ENCOUNTER — Ambulatory Visit: Payer: Medicare HMO | Admitting: Gastroenterology

## 2017-06-10 ENCOUNTER — Inpatient Hospital Stay (HOSPITAL_COMMUNITY): Payer: Medicare HMO

## 2017-06-10 DIAGNOSIS — R601 Generalized edema: Secondary | ICD-10-CM

## 2017-06-10 DIAGNOSIS — I4891 Unspecified atrial fibrillation: Secondary | ICD-10-CM

## 2017-06-10 LAB — BASIC METABOLIC PANEL
Anion gap: 7 (ref 5–15)
Anion gap: 7 (ref 5–15)
BUN: 18 mg/dL (ref 6–20)
BUN: 18 mg/dL (ref 6–20)
CALCIUM: 8.7 mg/dL — AB (ref 8.9–10.3)
CHLORIDE: 115 mmol/L — AB (ref 101–111)
CHLORIDE: 110 mmol/L (ref 101–111)
CO2: 25 mmol/L (ref 22–32)
CO2: 25 mmol/L (ref 22–32)
CREATININE: 0.9 mg/dL (ref 0.44–1.00)
CREATININE: 1.09 mg/dL — AB (ref 0.44–1.00)
Calcium: 8.3 mg/dL — ABNORMAL LOW (ref 8.9–10.3)
GFR calc Af Amer: >60 mL/min (ref >60)
GFR calc non Af Amer: >60 mL/min (ref >60)
GFR calc non Af Amer: 53 mL/min — ABNORMAL LOW (ref >60)
Glucose, Bld: 193 mg/dL — ABNORMAL HIGH (ref 65–99)
Glucose, Bld: 190 mg/dL — ABNORMAL HIGH (ref 65–99)
Potassium: 3 mmol/L — ABNORMAL LOW (ref 3.5–5.1)
Potassium: 2.8 mmol/L — ABNORMAL LOW (ref 3.5–5.1)
SODIUM: 147 mmol/L — AB (ref 135–145)
SODIUM: 142 mmol/L (ref 135–145)

## 2017-06-10 LAB — PROTIME-INR
INR: 2.51
Prothrombin Time: 26.9 seconds — ABNORMAL HIGH (ref 11.4–15.2)

## 2017-06-10 LAB — GLUCOSE, CAPILLARY
Glucose-Capillary: 163 mg/dL — ABNORMAL HIGH (ref 65–99)
Glucose-Capillary: 150 mg/dL — ABNORMAL HIGH (ref 65–99)
Glucose-Capillary: 171 mg/dL — ABNORMAL HIGH (ref 65–99)
Glucose-Capillary: 175 mg/dL — ABNORMAL HIGH (ref 65–99)
Glucose-Capillary: 182 mg/dL — ABNORMAL HIGH (ref 65–99)

## 2017-06-10 LAB — PHOSPHORUS: Phosphorus: 2.5 mg/dL (ref 2.5–4.6)

## 2017-06-10 LAB — MAGNESIUM: Magnesium: 1.9 mg/dL (ref 1.7–2.4)

## 2017-06-10 LAB — VANCOMYCIN, TROUGH: Vancomycin Tr: 27 ug/mL (ref 15–20)

## 2017-06-10 LAB — LACTIC ACID, PLASMA
Lactic Acid, Venous: 3.6 mmol/L (ref 0.5–1.9)
Lactic Acid, Venous: 2.8 mmol/L (ref 0.5–1.9)

## 2017-06-10 LAB — VANCOMYCIN, RANDOM: Vancomycin Rm: 18

## 2017-06-10 LAB — PROCALCITONIN: Procalcitonin: 0.14 ng/mL

## 2017-06-10 MED ORDER — DEXTROSE 5 % IV SOLN
INTRAVENOUS | Status: DC
  Administered 2017-06-10: 11:00:00 via INTRAVENOUS
  Administered 2017-06-11: 50 mL via INTRAVENOUS

## 2017-06-10 MED ORDER — POTASSIUM CHLORIDE 10 MEQ/50ML IV SOLN
10.0000 meq | INTRAVENOUS | Status: AC
  Administered 2017-06-10 (×4): 10 meq via INTRAVENOUS
  Filled 2017-06-10 (×4): qty 50

## 2017-06-10 MED ORDER — POTASSIUM CHLORIDE 10 MEQ/50ML IV SOLN
10.0000 meq | INTRAVENOUS | Status: AC
  Administered 2017-06-10 – 2017-06-11 (×6): 10 meq via INTRAVENOUS
  Filled 2017-06-10 (×6): qty 50

## 2017-06-10 MED ORDER — PANTOPRAZOLE SODIUM 40 MG PO TBEC
40.0000 mg | DELAYED_RELEASE_TABLET | Freq: Every day | ORAL | Status: DC
  Administered 2017-06-10 – 2017-06-12 (×3): 40 mg via ORAL
  Filled 2017-06-10 (×3): qty 1

## 2017-06-10 MED ORDER — VANCOMYCIN HCL 10 G IV SOLR
1250.0000 mg | INTRAVENOUS | Status: DC
  Administered 2017-06-10: 1250 mg via INTRAVENOUS
  Filled 2017-06-10 (×2): qty 1250

## 2017-06-10 MED ORDER — POTASSIUM CHLORIDE CRYS ER 20 MEQ PO TBCR
40.0000 meq | EXTENDED_RELEASE_TABLET | Freq: Two times a day (BID) | ORAL | Status: DC
  Administered 2017-06-10 – 2017-06-11 (×4): 40 meq via ORAL
  Filled 2017-06-10 (×5): qty 2

## 2017-06-10 MED ORDER — DEXTROSE 5 % IV SOLN
10.0000 mg/h | INTRAVENOUS | Status: DC
  Administered 2017-06-10: 8 mg/h via INTRAVENOUS
  Administered 2017-06-11 – 2017-06-12 (×2): 10 mg/h via INTRAVENOUS
  Filled 2017-06-10 (×5): qty 25
  Filled 2017-06-10: qty 21

## 2017-06-10 MED ORDER — METOLAZONE 10 MG PO TABS
10.0000 mg | ORAL_TABLET | Freq: Once | ORAL | Status: AC
  Administered 2017-06-10: 10 mg via ORAL
  Filled 2017-06-10: qty 1

## 2017-06-10 MED ORDER — PANTOPRAZOLE SODIUM 40 MG PO PACK: 40.0000 mg | PACK | Freq: Every day | ORAL | Status: DC

## 2017-06-10 MED ORDER — RESOURCE THICKENUP CLEAR PO POWD
ORAL | Status: DC | PRN
  Filled 2017-06-10: qty 125

## 2017-06-10 MED ORDER — IBUPROFEN 200 MG PO TABS
600.0000 mg | ORAL_TABLET | Freq: Four times a day (QID) | ORAL | Status: DC | PRN
  Administered 2017-06-10: 600 mg via ORAL
  Filled 2017-06-10: qty 3

## 2017-06-10 MED ORDER — CITALOPRAM HYDROBROMIDE 10 MG PO TABS
10.0000 mg | ORAL_TABLET | Freq: Every day | ORAL | Status: DC
  Administered 2017-06-10 – 2017-06-14 (×5): 10 mg via ORAL
  Filled 2017-06-10 (×5): qty 1

## 2017-06-10 MED ORDER — PHYTONADIONE 5 MG PO TABS
10.0000 mg | ORAL_TABLET | Freq: Once | ORAL | Status: AC
  Administered 2017-06-11: 10 mg via ORAL
  Filled 2017-06-10: qty 2

## 2017-06-10 NOTE — Progress Notes (Signed)
[facility] ADULT ICU REPLACEMENT PROTOCOL FOR AM LAB REPLACEMENT ONLY  The patient does apply for the [facility] Adult ICU Electrolyte Replacment Protocol based on the criteria listed below:   1. Is GFR >/= 40 ml/min? Yes.    Patient's GFR today is >60 2. Is urine output >/= 0.5 ml/kg/hr for the last 6 hours? Yes.   Patient's UOP is 1.04  ml/kg/hr 3. Is BUN < 60 mg/dL? Yes.    Patient's BUN today is 18 4. Abnormal electrolyte(s): Potassium 3.0 5. Ordered repletion with: Potassium per protocol 6. If a panic level lab has been reported, has the CCM MD in charge been notified? Yes.  .   Physician:  Joesph Fillers, Lianette Broussard P 06/10/2017 3:57 AM

## 2017-06-10 NOTE — Progress Notes (Signed)
  Speech Language Pathology Treatment: Dysphagia  Patient Details Name: Leah Shah MRN: [id number] DOB: 1956/05/12 Today's Date: 06/10/2017 Time: 1638-4665 SLP Time Calculation (min) (ACUTE ONLY): 19 min  Assessment / Plan / Recommendation Clinical Impression  Pt's voice sounds clearer and stronger than during this SLP's last visit. She coughed upon SLP arrival with small amount of what appeared to be food particles suctioned out of her mouth. She consumed nectar thick liquids and advanced trials of thin liquids with immediate coughing only noted after trials of thin liquids by straw. This was alleviated when SLP removed the straw, even when challenged with larger boluses. Recommend maintaining Dys 2 diet but advancing to thin liquids by cup. Will continue to follow and progress as tolerated.   HPI HPI: Pt is a 61 y.o.femalewith PMH including NASH cirrhosis, chronic hepatitis, portal hypertension, and Wilson's disease. She is followed by Dr. Marius Ditch at [facility] and she is apparently being evaluated for liver transplant at [facility]. She presented 11/30 with several days of AMS and fall on day of admission. Per MD note, differential dx includes sepsis (concern for possible PNA) versus decompensated cirrhosis and organ failure in setting of noncompliance with meds, including lactulose. Pt requried intubation 12/1-12/3.      SLP Plan  Continue with current plan of care       Recommendations  Diet recommendations: Dysphagia 2 (fine chop);Thin liquid Liquids provided via: Cup;No straw Medication Administration: Whole meds with puree Supervision: Patient able to self feed;Full supervision/cueing for compensatory strategies Compensations: Minimize environmental distractions;Slow rate;Small sips/bites Postural Changes and/or Swallow Maneuvers: Seated upright 90 degrees                Oral Care Recommendations: Oral care BID Follow up Recommendations: Home health SLP;24 hour  supervision/assistance SLP Visit Diagnosis: Dysphagia, unspecified (R13.10) Plan: Continue with current plan of care       GO                Germain Osgood 06/10/2017, 11:52 AM  Germain Osgood, M.A. CCC-SLP [phone]

## 2017-06-10 NOTE — Progress Notes (Signed)
Pharmacy Antibiotic Note  Leah Shah is a 61 y.o. female admitted on 05/10/2017 with sepsis of unclear etiology. Possible multifocal pneumonia and/or intra-abdominal infection. Pharmacy has been consulted for Vancomycin dosing. Zosyn dosing per MD. Vancomycin trough this AM is elevated at 27 mcg/mL. Repeat Vancomycin trough is 18 mcg/mL after 12 hours from last level and ~22.5 hours from last dose. Renal function appears to have normalized with good UOP ~ 0.9 mg/kg/hr.   Plan: Restart Vancomycin 1250 mg IV every 24 hours - start at 1800 PM.  Monitor renal function and adjust therapy as needed.   Height: 5\' 8"  (172.7 cm) Weight: (!) 310 lb 13.6 oz (141 kg) IBW/kg (Calculated) : 63.9  Temp (24hrs), Avg:97.7 F (36.5 C), Min:97.4 F (36.3 C), Max:98.8 F (37.1 C)  Recent Labs  Lab 06/05/17 1901 06/06/17 0451 06/06/17 1423 06/07/17 0300 06/07/17 0515 06/08/17 0426 06/09/17 0500 06/09/17 1521 06/09/17 2114 06/10/17 0247 06/10/17 0835 06/10/17 1512  WBC  --  9.4 6.4 7.1  --  7.4 11.6*  --   --   --   --   --   CREATININE  --  1.37*  --   --  1.10* 0.96 0.80  --   --  0.90  --   --   LATICACIDVEN 2.1*  --   --   --   --   --   --  3.7* 4.7* 3.6* 2.8*  --   VANCOTROUGH  --   --   --   --   --   --   --   --   --  27*  --   --   VANCORANDOM  --   --   --   --   --   --   --   --   --   --   --  18    Estimated Creatinine Clearance: 96.9 mL/min (by C-G formula based on SCr of 0.9 mg/dL).    Allergies  Allergen Reactions  . Bacitracin-Neomycin-Polymyxin Other (See Comments)  . Codeine Other (See Comments)    Itching "she can take some forms of it" Itching "she can take some forms of it"  . Gabapentin Nausea Only  . Neosporin  [Neomycin-Bacitracin Zn-Polymyx]   . Pregabalin Other (See Comments)    stomach issues stomach issues    Sloan Leiter, PharmD, BCPS, BCCCP Clinical Pharmacist Clinical phone 06/10/2017 until 3:30PM - #[phone] After hours, please call  [phone] 06/10/2017 4:08 PM

## 2017-06-10 NOTE — Progress Notes (Addendum)
Pharmacy Antibiotic Note  Leah Shah is a 61 y.o. female admitted on 05/29/2017 with intra-abdominal infection.  Pharmacy has been consulted for Vancomycin dosing. Vancomycin trough this AM is elevated at 27. Renal function appears to have normalized.   Plan: -Hold vancomycin for now -Re-check vancomycin random level in 12 hours to assess clearance  Height: 5\' 8"  (172.7 cm) Weight: (!) 315 lb 0.6 oz (142.9 kg) IBW/kg (Calculated) : 63.9  Temp (24hrs), Avg:97.8 F (36.6 C), Min:97.4 F (36.3 C), Max:98.6 F (37 C)  Recent Labs  Lab 06/05/17 1610 06/05/17 1901 06/06/17 0451 06/06/17 1423 06/07/17 0300 06/07/17 0515 06/08/17 0426 06/09/17 0500 06/09/17 1521 06/09/17 2114 06/10/17 0247  WBC  --   --  9.4 6.4 7.1  --  7.4 11.6*  --   --   --   CREATININE  --   --  1.37*  --   --  1.10* 0.96 0.80  --   --  0.90  LATICACIDVEN 1.8 2.1*  --   --   --   --   --   --  3.7* 4.7* 3.6*  VANCOTROUGH  --   --   --   --   --   --   --   --   --   --  27*    Estimated Creatinine Clearance: 97.7 mL/min (by C-G formula based on SCr of 0.9 mg/dL).    Allergies  Allergen Reactions  . Bacitracin-Neomycin-Polymyxin Other (See Comments)  . Codeine Other (See Comments)    Itching "she can take some forms of it" Itching "she can take some forms of it"  . Gabapentin Nausea Only  . Neosporin  [Neomycin-Bacitracin Zn-Polymyx]   . Pregabalin Other (See Comments)    stomach issues stomach issues    Narda Bonds 06/10/2017 4:11 AM

## 2017-06-10 NOTE — Progress Notes (Signed)
eLink Physician-Brief Progress Note Patient Name: Leah Shah DOB: 1955/09/05 MRN: [id number]   Date of Service  06/10/2017  HPI/Events of Note  BMP reviewed with potassium 2.8. Serum creatinine 1.09. Currently on Lasix drip. Has Central IV access.   eICU Interventions  KCl 10 mEq IV 6 runs ordered      Intervention Category Major Interventions: Electrolyte abnormality - evaluation and management  Tera Partridge 06/10/2017, 10:29 PM

## 2017-06-10 NOTE — Progress Notes (Signed)
PULMONARY / CRITICAL CARE MEDICINE   Name: Leah Shah MRN: [id number] DOB: 1955-12-19    ADMISSION DATE:  05/28/2017 CONSULTATION DATE: 05/26/2017  REFERRING MD:  Eulis Foster  CHIEF COMPLAINT:  AMS  HISTORY OF PRESENT ILLNESS:   Leah Shah is a 61 y.o. female with PMH of cirrhosis, chronic hepatitis, portal hypertension, recently diagnosed Wilson's disease (mildly elevated ceruloplasmin, 24 hr urinary copper, liver biopsy copper via transjugular liver biopsy 05/07/17, NO Beatriz Chancellor rings on ophthalmology eval).  She is followed by Dr. Marius Ditch at [facility] and she is apparently being evaluated for liver transplant at [facility].  She presented to [facility] ED 11/30 with altered mental status.  She has apparently been confused for the past several days per her daughter-in-law.  Daughter-in-law and additional family had been trying to convince her to move in with family so that they could help her with daily activities and to ensure that she would take her medications as prescribed.  Patient has apparently not been good about taking her medicines as prescribed and misses several doses on a daily basis.  Due to this, she was supposed to move in with daughter-in-law on 11/30.  When daughter-in-law called her that morning, there was no answer despite several calls.  Daughter-in-law then went to patient's home and found her on the floor sitting next to the chair.  Patient had told her that she apparently had a fall, was able to tell that she did not hit her head or lose consciousness. She had not had any recent complaints and had reportedly been in her usual state of health besides for confusion (felt to be due to missing lactulose), and decreased PO intake.  In ED, she was found to be encephalopathic, hypotensive, hypothermic.  Potassium was greater than 7.5 (she received temporizing measures), ammonia 163, lactate 4.13 (repeat 4.82 > 1.91).  PCCM consulted for admission  Of note, she has had extensive  workup of her liver disease including normal iron & ferritin, Hepatitis total A Ab positive, Hepatitis B surface Ag negative, Hepatitis B surface Ab mildly positive,& Hepatitis C Ab negative, HIV negative, ANA negative, AMA & anti-SMA negative, LKM ab negative, Immunoglobulins normal, A-1 antitrypsin normal, celiac serology negative.  As mentioned above, she recently had mildly elevated ceruloplasmin, 24 hr urinary copper, liver biopsy copper via transjugular liver biopsy 05/07/17, NO [facility] rings on ophthalmology eval.   SUBJECTIVE:  Pt asking how long she has to be in the hospital, is it her fault that she is sick.  Doesn't understand why she had to have thickened liquids.  RN reports low K.    VITAL SIGNS: BP 102/84   Pulse 78   Temp (!) 97.4 F (36.3 C) (Oral)   Resp 19   Ht 5\' 8"  (1.727 m)   Wt (!) 310 lb 13.6 oz (141 kg)   SpO2 91%   BMI 47.26 kg/m   HEMODYNAMICS:    VENTILATOR SETTINGS:    INTAKE / OUTPUT: I/O last 3 completed shifts: In: 1599.7 [I.V.:249.7; IV Piggyback:1350] Out: 5075 [ACZYS:0630; ZSWFU:9323]  PHYSICAL EXAMINATION:  General: morbidly obese female in NAD, lying in bed HEENT: MM pink/moist, jaundice, R IJ TLC, bruising on neck Neuro: Awake, alert, answers orientation questions appropriately but situational confusion  CV: s1s2 rrr, no m/r/g PULM: even/non-labored, lungs bilaterally clear anterior, diminished bases  FT:DDUK, non-tender, bsx4 active  Extremities: warm/dry, generalized anasarca / pitting edema  Skin: thin skin, multiple bruises, skin tears, jaundice   LABS:  BMET Recent  Labs  Lab 06/08/17 0426 06/09/17 0500 06/10/17 0247  NA 142 143 147*  K 4.1 3.7 3.0*  CL 109 109 115*  CO2 25 25 25   BUN 26* 18 18  CREATININE 0.96 0.80 0.90  GLUCOSE 144* 134* 193*    Electrolytes Recent Labs  Lab 06/08/17 0426 06/09/17 0500 06/10/17 0247  CALCIUM 8.7* 8.5* 8.7*  MG 1.9 1.6* 1.9  PHOS 3.1 2.9 2.5    CBC Recent Labs  Lab  06/07/17 0300 06/08/17 0426 06/09/17 0500  WBC 7.1 7.4 11.6*  HGB 8.0* 7.9* 8.2*  HCT 22.6* 22.9* 23.9*  PLT 63* 63* 65*    Coag's Recent Labs  Lab 06/05/17 0527 06/08/17 0426 06/10/17 0247  INR 1.88 2.36 2.51    Sepsis Markers Recent Labs  Lab 06/06/17 0451 06/09/17 1508 06/09/17 1521 06/09/17 2114 06/10/17 0247  LATICACIDVEN  --   --  3.7* 4.7* 3.6*  PROCALCITON 0.13 0.10  --   --  0.14    ABG Recent Labs  Lab 06/05/17 0635 06/05/17 0853 06/09/17 0858  PHART 7.359 7.396 7.453*  PCO2ART 37.4 35.1 36.6  PO2ART 69.9* 409* 49.1*    Liver Enzymes Recent Labs  Lab 05/23/2017 1300 06/05/17 1609 06/08/17 0426  AST 59* 47* 41  ALT 26 25 28   ALKPHOS 148* 131* 95  BILITOT 7.6* 8.2* 10.5*  ALBUMIN 2.5* 2.3* 2.6*    Cardiac Enzymes Recent Labs  Lab 05/24/2017 1956 06/05/17 0029  TROPONINI 0.15* 0.20*    Glucose Recent Labs  Lab 06/09/17 1152 06/09/17 1558 06/09/17 1950 06/10/17 0000 06/10/17 0418 06/10/17 0753  GLUCAP 122* 205* 197* 217* 163* 150*    Imaging Dg Chest Port 1 View  Result Date: 06/10/2017 CLINICAL DATA:  Respiratory failure EXAM: PORTABLE CHEST 1 VIEW COMPARISON:  Portable chest x-ray of 06/09/2017 FINDINGS: There is little change in airspace disease diffusely most consistent with edema although pneumonia cannot be excluded. No definite effusion is seen. Mild cardiomegaly is stable. Right IJ central venous line remains unchanged in position. IMPRESSION: Little change in diffuse airspace disease consistent with edema or pneumonia. Electronically Signed   By: Ivar Drape M.D.   On: 06/10/2017 08:00    STUDIES:  CXR 11/30 > chronic interstitial disease. CXR 12/1> ETT in place, No acute changes Ultrasound abdomen 12/1> cirrhotic liver, minimal ascites Echocardiogram 12/1> mild LVH, LVEF 65-70%. CT chest abd/pelv 12/2 > possible multifocal PNA vs asymmetric pulmonary edema.  CULTURES:  Blood 11/30 >> negative  Urine 11/30 >  negative  ANTIBIOTICS: Vanc 11/30 >  Zosyn 11/30 >   SIGNIFICANT EVENTS: 11/30  Admit 12/05  Tx to ICU for [facility], increased WOB    LINES/TUBES: ETT 12/1 > 12/3 CVL Rt IJ 12/1> Radial a line 11/30 > 12/1  DISCUSSION: 62 y.o. female with PMH including NASH cirrhosis, chronic hepatitis, portal hypertension,Wilson's disease. She is followed by Dr. Marius Ditch at [facility] and she is apparently being evaluated for liver transplant at [facility]. She presented [facility] 11/30 with acute encephalopathy and sepsis of unclear etiology.  She was subsequently admitted to the ICU for further eval and management.  Transferred to the floor on 06/08/2017 and during the early a.m. of 06/09/2017 developed atrial fibrillation rapid ventricular response along with hypotension.  She was placed on diltiazem drip with some improvement in her heart rate.  She is dyspneic with any exertion.  Stat portable chest x ray revealed frank pulmonary edema.  Due to her multiple medical problems, morbid obesity, pulmonary edema with  recent extubation on 06/07/2017, she will be returned to the intensive care for further evaluation and treatment.  She may need noninvasive mechanical ventilatory support and/or intubation.  ASSESSMENT / PLAN:  PULMONARY A: Acute resp failure - s/p intubation, extubated 12/3 and tolerated well Acute Respiratory Distress - 12/4, in setting of AFwRVR P:   O2 to support sats > 90% Pulmonary hygiene - IS, mobilze  Follow intermittent CXR   CARDIOVASCULAR A:  Sepsis - of unclear etiology, possible peritonitis in setting ascites / cirrhosis.  A repeat sepsis assessment has been performed. Troponin bump - due to above. Hx HTN, HLD, chronic dCHF (echo from Feb 2018 with EF 83-15%, LV diastolic dysfunction, mild MR, mod LA dilation). P:  Discontinue stress dose steroids Continue ASA  Begin Lasix gtt at 8 mg/hr  Hold home atenolol, spironolactone  Continue IV lopressor   RENAL A:    Hyponatremia - presumed due to hypovolemia, cirrhosis.  Resolved. Hypernatremia  Hyperkalemia - s/p temporizing measures in ED.   Resolved. NAGMA - resolved. AKI - improving. Hypo-magnesium P:   Trend BMP / urinary output > BMP at 2100 to follow Na, renal function  Replace electrolytes as indicated Avoid nephrotoxic agents as able, ensure adequate renal perfusion Begin lasix gtt at 8 mg/hr D5W @ 50 ml/hr   GASTROINTESTINAL A:   Hx NASH cirrhosis, chronic hepatitis, recent dx Wilson's disease (followed by Dr. Marius Ditch at [facility]) - reportedly being worked up for transplant at [facility]. Hyperbilirubinemia - chronic, due to above. No ascites noted on abd ultrasound Dysphagia - failed swallow eval 12/4 P:   GI following, appreciate input  Continue protonix, lactulose, rifaximin  SLP following  HEMATOLOGIC A:   Anemia - chronic, s/p 1u PRBC transfusion 12/2 Thrombocytopenia - chronic. VTE Prophylaxis. P:  Follow CBC Monitor for bleeding   INFECTIOUS A:   Sepsis - of unclear etiology, possible peritonitis in setting ascites / cirrhosis.  Ultrasound with minimal peritoneal fluid making peritonitis unlikely.  Possible multifocal PNA based on CT chest.    P:   Continue zosyn, vanco  Follow cultures  ENDOCRINE A: No acute issues. P: Monitor glucose on BMP  NEUROLOGIC A:   Acute encephalopathy - presumed multifactorial in setting hepatic.  Soft encephalopathy + sepsis. Hx headache, anxiety. P:   Continue lacutlose, rifaximin  Avoid sedating medications  PT efforts  Resume citalopram  Hold home hydroxyzine, Oxy IR, trazodone (PRN), cyclobenzaprine, & PRN xananx  Monitor for benzo withdrawal    Family updated:  No family at bedside am 12/6.  Patient updated on plan of care.   Noe Gens, NP-C [place] [facility] Pgr: [phone] or if no answer [phone] 06/10/2017, 9:30 AM  Attending Note:  61 year old female with severe liver disease and fluid overload,  comes back to the ICU for a-fib with RVR and associated pulmonary edema and liver failure.  On exam, diffuse crackles.  I reviewed CXR myself, pulmonary edema noted.  Discussed with PCCM-NP.  Will place on D5W given hypernatremia, lasix drip at 8 mg/hr, give zaroxolyn 10 mg PO x1.  Replace electrolytes as needed.  Rate control via cardizem.  Will hold in the ICU overnight and diurese.  The patient is critically ill with multiple organ systems failure and requires high complexity decision making for assessment and support, frequent evaluation and titration of therapies, application of advanced monitoring technologies and extensive interpretation of multiple databases.   Critical Care Time devoted to patient care services described in this  note is  35  Minutes. This time reflects time of care of this signee Dr Jennet Maduro. This critical care time does not reflect procedure time, or teaching time or supervisory time of PA/NP/Med student/Med Resident etc but could involve care discussion time.  Rush Farmer, M.D. [facility] Pulmonary/Critical Care Medicine. Pager: [phone]. After hours pager: [phone].

## 2017-06-10 NOTE — Progress Notes (Signed)
Physical Therapy Treatment Patient Details Name: Leah Shah MRN: [id number] DOB: 1956/02/17 Today's Date: 06/10/2017    History of Present Illness Pt is a 61 y.o. female with PMH including NASH cirrhosis, chronic hepatitis, portal hypertension, and Wilson's disease. She is followed by Dr. Marius Shah at [facility] and she is apparently being evaluated for liver transplant at [facility]. She presented 11/30 with several days of AMS and fall on day of admission. Per MD note, differential dx includes sepsis (concern for possible PNA) versus decompensated cirrhosis and organ failure in setting of noncompliance with meds, including lactulose. Pt requried intubation 12/1-12/3.     PT Comments    Pt making slow progress with functional mobility. Pt limited this session secondary to fatigue and anxiety. After pt transferred from bed to recliner chair, she demonstrated increased WOB with SPO2 decreasing to mid 70's. Pt's RN entered room and applied non-rebreather mask with quick recovery of SPO2 to mid 90's. Pt would continue to benefit from skilled physical therapy services at this time while admitted and after d/c to address the below listed limitations in order to improve overall safety and independence with functional mobility.   Follow Up Recommendations  Home health PT;Supervision/Assistance - 24 hour     Equipment Recommendations  3in1 (PT);Other (comment)(bariatric)    Recommendations for Other Services       Precautions / Restrictions Precautions Precautions: Fall Precaution Comments: watch HR, SPO2 Restrictions Weight Bearing Restrictions: No    Mobility  Bed Mobility Overal bed mobility: Needs Assistance Bed Mobility: Supine to Sit     Supine to sit: +2 for physical assistance;Min assist     General bed mobility comments: assist for LEs and elevation of trunk  Transfers Overall transfer level: Needs assistance Equipment used: Rolling walker (2 wheeled) Transfers: Sit  to/from [facility] Sit to Stand: Mod assist;+2 physical assistance;From elevated surface Stand pivot transfers: Mod assist;+2 safety/equipment       General transfer comment: assist to power into standing and with stability for transitional movements; pt became very anxious after transfer and therapist incorporated relaxation techniques  Ambulation/Gait                 Stairs            Wheelchair Mobility    Modified Rankin (Stroke Patients Only)       Balance Overall balance assessment: Needs assistance Sitting-balance support: No upper extremity supported;Feet supported Sitting balance-[name] Scale: Good     Standing balance support: Bilateral upper extremity supported;During functional activity Standing balance-[name] Scale: Poor Standing balance comment: relies on UE support for balance.                             Cognition Arousal/Alertness: Awake/alert Behavior During Therapy: Anxious Overall Cognitive Status: Impaired/Different from baseline Area of Impairment: Memory;Safety/judgement;Problem solving                     Memory: Decreased short-term memory   Safety/Judgement: Decreased awareness of safety   Problem Solving: Difficulty sequencing;Requires verbal cues        Exercises      General Comments        Pertinent Vitals/Pain Pain Assessment: No/denies pain    Home Living                      Prior Function  PT Goals (current goals can now be found in the care plan section) Acute Rehab PT Goals PT Goal Formulation: With patient Time For Goal Achievement: 06/22/17 Potential to Achieve Goals: Good Progress towards PT goals: Progressing toward goals    Frequency    Min 3X/week      PT Plan Current plan remains appropriate    Co-evaluation              AM-PAC PT "6 Clicks" Daily Activity  Outcome Measure  Difficulty turning over in bed (including  adjusting bedclothes, sheets and blankets)?: A Lot Difficulty moving from lying on back to sitting on the side of the bed? : Unable Difficulty sitting down on and standing up from a chair with arms (e.g., wheelchair, bedside commode, etc,.)?: Unable Help needed moving to and from a bed to chair (including a wheelchair)?: A Lot Help needed walking in hospital room?: A Lot Help needed climbing 3-5 steps with a railing? : Total 6 Click Score: 9    End of Session Equipment Utilized During Treatment: Gait belt;Oxygen Activity Tolerance: Patient limited by fatigue Patient left: in chair;with call bell/phone within reach;with chair alarm set;with family/visitor present Nurse Communication: Mobility status;Need for lift equipment PT Visit Diagnosis: Muscle weakness (generalized) (M62.81);Unsteadiness on feet (R26.81)     Time: 4917-9150 PT Time Calculation (min) (ACUTE ONLY): 28 min  Charges:  $Therapeutic Activity: 23-37 mins                    G Codes:       [place], [place], [place] 06/10/2017, 5:16 PM

## 2017-06-11 ENCOUNTER — Inpatient Hospital Stay (HOSPITAL_COMMUNITY): Payer: Medicare HMO

## 2017-06-11 DIAGNOSIS — E8770 Fluid overload, unspecified: Secondary | ICD-10-CM

## 2017-06-11 DIAGNOSIS — R601 Generalized edema: Secondary | ICD-10-CM

## 2017-06-11 DIAGNOSIS — I480 Paroxysmal atrial fibrillation: Secondary | ICD-10-CM

## 2017-06-11 DIAGNOSIS — J810 Acute pulmonary edema: Secondary | ICD-10-CM

## 2017-06-11 LAB — CBC
HEMATOCRIT: 25.8 % — AB (ref 36.0–46.0)
Hemoglobin: 8.8 g/dL — ABNORMAL LOW (ref 12.0–15.0)
MCH: 38.8 pg — ABNORMAL HIGH (ref 26.0–34.0)
MCHC: 34.1 g/dL (ref 30.0–36.0)
MCV: 113.7 fL — AB (ref 78.0–100.0)
Platelets: 52 10*3/uL — ABNORMAL LOW (ref 150–400)
RBC: 2.27 MIL/uL — ABNORMAL LOW (ref 3.87–5.11)
WBC: 25.1 10*3/uL — ABNORMAL HIGH (ref 4.0–10.5)

## 2017-06-11 LAB — PROCALCITONIN: PROCALCITONIN: 0.21 ng/mL

## 2017-06-11 LAB — GLUCOSE, CAPILLARY
Glucose-Capillary: 121 mg/dL — ABNORMAL HIGH (ref 65–99)
Glucose-Capillary: 125 mg/dL — ABNORMAL HIGH (ref 65–99)
Glucose-Capillary: 145 mg/dL — ABNORMAL HIGH (ref 65–99)
Glucose-Capillary: 164 mg/dL — ABNORMAL HIGH (ref 65–99)
Glucose-Capillary: 183 mg/dL — ABNORMAL HIGH (ref 65–99)
Glucose-Capillary: 216 mg/dL — ABNORMAL HIGH (ref 65–99)

## 2017-06-11 LAB — BASIC METABOLIC PANEL
Anion gap: 7 (ref 5–15)
BUN: 19 mg/dL (ref 6–20)
CALCIUM: 8.5 mg/dL — AB (ref 8.9–10.3)
CO2: 27 mmol/L (ref 22–32)
CREATININE: 1.13 mg/dL — AB (ref 0.44–1.00)
Chloride: 108 mmol/L (ref 101–111)
GFR calc non Af Amer: 51 mL/min — ABNORMAL LOW (ref >60)
GFR, EST AFRICAN AMERICAN: 59 mL/min — AB (ref >60)
GLUCOSE: 138 mg/dL — AB (ref 65–99)
Potassium: 3.3 mmol/L — ABNORMAL LOW (ref 3.5–5.1)
Sodium: 142 mmol/L (ref 135–145)

## 2017-06-11 LAB — PHOSPHORUS: Phosphorus: 2.8 mg/dL (ref 2.5–4.6)

## 2017-06-11 LAB — MAGNESIUM: Magnesium: 1.6 mg/dL — ABNORMAL LOW (ref 1.7–2.4)

## 2017-06-11 MED ORDER — METOLAZONE 10 MG PO TABS
10.0000 mg | ORAL_TABLET | Freq: Once | ORAL | Status: AC
  Administered 2017-06-11: 10 mg via ORAL
  Filled 2017-06-11: qty 1

## 2017-06-11 MED ORDER — ALBUMIN HUMAN 5 % IV SOLN
25.0000 g | Freq: Four times a day (QID) | INTRAVENOUS | Status: AC
  Administered 2017-06-11 – 2017-06-12 (×4): 25 g via INTRAVENOUS
  Filled 2017-06-11 (×5): qty 500

## 2017-06-11 NOTE — Consult Note (Signed)
Cardiology Consultation:   Patient ID: Leah Shah; [id number]; [birth year]   Admit date: 06/02/2017 Date of Consult: 06/11/2017  Primary Care Provider: Roselee Nova, MD Primary Cardiologist:  Had been Dr. Yvone Neu in [facility] Primary Electrophysiologist:  na   Patient Profile:   Leah Shah is a 61 y.o. female with a hx of chronic diastolic HF, HLD, HTN, mod MR, cirrhosis, chronic hepatitis, portal HTN and wilson's disease and morbid obesity who is being seen today for the evaluation of atrial fib at the request of Dr. Lavina Hamman after admit for AMS.  History of Present Illness:   Leah Shah a hx of chronic diastolic HF, HLD, HTN, mod MR, cirrhosis, chronic hepatitis and wilson's disease followed by Dr. Marius Ditch at [facility] and is being evaluated at [facility] for liver transplant and morbid obesity now admitted 05/06/2017 with altered mental status.  She had been confused for several days.  Family found at home on floor after a fall but no loss of consciousness and no head injury.   (previous echo and nuc study 2017 with normal EF and no ischemia on nuc)  Initial K+ was 7.5, ammonia 163 pt had not always taken her meds inculding lactulose, lactate 4.13, treated with IV fluid, D50, 10u insulin, Ca and alb. Neb,  2 amps HC03, and Kayexalate PR, HCO3 drip and vanc and zosyn.  Hypotensive with MAP in the 50s and placed on neo-synephrine IV.   DX with sepsis of unclear etiology, thrombocytopenia with plts 114-chronic, anemia with Hgb 10 eventual drop to 6.8 and transfused 1 u PRBCs, WBC 12.2  BNP 332 .  Troponin 0.33, 0.15,  0.20    EKG SR non specific intraventricular conduction delay no acute changes.  I personally reviewed  Telemetry:  Telemetry was personally reviewed and demonstrates:  SR with PACs at times PAF  By 06/05/17 pt was intubated for acute respiratory insuff. And pulmonary edema on CXR.   AKI as well.   She was extubated 06/07/17.    By 06/09/17 pt developed atrial fib with RVR.  HR 143.   rec'd 500 cc NS then 2.5 mg IV lopressor.  No change in rate then 10 mg IV dilt, and drip started.  UNABLE to give amiodarone with liver failure.   CHADS-VASc score 3.  Also with K+ 3.7 and Mg+ 1.6 IV Mg given, KCL, and Ca gluconate given.   Later 06/09/17 she began having hallucinatins.  Also with respiratory distress now requiring BiPap.  She did convert back to SR.  Currently was just placed on Bipap and sleeping.  Has not had chest pain. Her edema has been increased at home. Family in room.   Past Medical History:  Diagnosis Date  . Anxiety   . Arthritis    R knee, L knee - OA  . Chronic diastolic CHF (congestive heart failure) ([place])    a. 06/2016 Echo: EF 60-65%, no rwma, mod MR, mildly dil LA, nl RV fxn, PASP 81mmHg.  [name] Cirrhosis ([place])   . Dysrhythmia    "mild arrythmia after taking Redux diet med years ago"no cardio fi  . Headache(784.0)   . History of stress test    a. 06/2016 MV: EF 67%, no ischemia/infarct.  . Hyperlipidemia   . Hypertension    echoJefm Bryant, wnl 2009? , had taken Redux for diet management  but now  off the market. pt, told that echo wnl.    . Moderate mitral regurgitation    a. 06/2016 Echo: EF 60-65%,  mod MR.  . Morbid obesity ([place])   . Osteoarthritis   . Recurrent upper respiratory infection (URI)    treated /w OTC med.     Past Surgical History:  Procedure Laterality Date  . ABDOMINAL HYSTERECTOMY     partial-uterus removed  . CHOLECYSTECTOMY     2011  . COCCYX REMOVAL     2004[facility]  . COLONOSCOPY WITH PROPOFOL N/A 03/11/2017   Procedure: COLONOSCOPY WITH PROPOFOL;  Surgeon: Lin Landsman, MD;  Location: [facility] ENDOSCOPY;  Service: Gastroenterology;  Laterality: N/A;  . ESOPHAGOGASTRODUODENOSCOPY (EGD) WITH PROPOFOL N/A 03/11/2017   Procedure: ESOPHAGOGASTRODUODENOSCOPY (EGD) WITH PROPOFOL;  Surgeon: Lin Landsman, MD;  Location: [facility] ENDOSCOPY;  Service: Gastroenterology;  Laterality: N/A;  . EYE SURGERY     states both lens replaced.  Not sure if cataract surgery or not.  [name] HEEL SPUR SURGERY    . I&D EXTREMITY  08/27/2011   Procedure: IRRIGATION AND DEBRIDEMENT EXTREMITY;  Surgeon: [name], MD;  Location: [place];  Service: Orthopedics;  Laterality: Right;  . IR TRANSCATHETER BX  05/07/2017  . JOINT REPLACEMENT Right 2012   Right TKR  . KNEE ARTHROPLASTY  07/28/2011   Procedure: COMPUTER ASSISTED TOTAL KNEE ARTHROPLASTY;  Surgeon: [name], MD;  Location: [place];  Service: Orthopedics;  Laterality: Right;  right total knee arthroplasty, computer assisted  . KNEE ARTHROSCOPY     right knee  . TMJ ARTHROPLASTY     [place] Medications:  Prior to Admission medications   Medication Sig Start Date End Date Taking? Authorizing Provider  albuterol (PROVENTIL HFA;VENTOLIN HFA) 108 (90 Base) MCG/ACT inhaler Inhale 2 puffs into the lungs every 6 (six) hours as needed for wheezing or shortness of breath. 08/29/16  Yes Wieting, Richard, MD  alprazolam Duanne Moron) 2 MG tablet Take 1 tablet (2 mg total) 2 (two) times daily as needed by mouth for anxiety. 05/20/17 06/19/17 Yes Roselee Nova, MD  aspirin 81 MG EC tablet Take 1 tablet (81 mg total) by mouth daily. 08/30/16  Yes Wieting, Richard, MD  atenolol (TENORMIN) 100 MG tablet TAKE 1 TABLET (100 MG TOTAL) BY MOUTH DAILY. 03/29/17  Yes Roselee Nova, MD  citalopram (CELEXA) 10 MG tablet Take 1 tablet (10 mg total) by mouth daily. 02/04/17  Yes Roselee Nova, MD  cyclobenzaprine (FLEXERIL) 5 MG tablet TAKE ONE AND ONE-HALF TABLETS BY MOUTH EVERY NIGHT AT BEDTIME 03/04/17  Yes Rochel Brome A, MD  furosemide (LASIX) 40 MG tablet Take 40 mg by mouth daily.   Yes [provider]  hydrOXYzine (ATARAX/VISTARIL) 25 MG tablet Take 1 tablet (25 mg total) every 8 (eight) hours as needed by mouth. 05/20/17  Yes Roselee Nova, MD  lactulose (CHRONULAC) 10 GM/15ML solution Take 15 mLs (10 g total) by mouth 2 (two) times daily. Patient taking  differently: Take 10 g by mouth 3 (three) times daily.  04/07/17  Yes Fritzi Mandes, MD  magnesium oxide (MAG-OX) 400 (241.3 Mg) MG tablet Take 1 tablet 2 (two) times daily by mouth. 04/29/17  Yes [provider]  omeprazole (PRILOSEC) 40 MG capsule Take 1 capsule (40 mg total) by mouth 2 (two) times daily before a meal. 03/11/17 06/09/17 Yes Vanga, Tally Due, MD  oxyCODONE (OXY IR/ROXICODONE) 5 MG immediate release tablet Take 1 tablet (5 mg total) every 8 (eight) hours as needed by mouth for severe pain. 05/20/17  Yes Keith Rake  Asad A, MD  potassium chloride SA (K-DUR,KLOR-CON) 20 MEQ tablet Take 20 mEq by mouth 2 (two) times daily.   Yes [provider]  spironolactone (ALDACTONE) 100 MG tablet Take 1 tablet (100 mg total) by mouth daily. 04/07/17 07/06/17 Yes Fritzi Mandes, MD  traZODone (DESYREL) 150 MG tablet Take 1 tablet by mouth daily as needed. 10/13/13  Yes [provider]  Vitamin D, Ergocalciferol, (DRISDOL) 50000 units CAPS capsule Take 1 capsule (50,000 Units total) by mouth every 7 (seven) days. 03/03/17 06/17/17 Yes Vanga, Tally Due, MD  cephALEXin (KEFLEX) 500 MG capsule Take 1 capsule (500 mg total) by mouth every 12 (twelve) hours. Patient not taking: Reported on 05/13/2017 04/07/17   Fritzi Mandes, MD  fluconazole (DIFLUCAN) 200 MG tablet Take 200 mg daily by mouth. 03/18/17   [provider]  Magnesium Oxide 400 (240 Mg) MG TABS TAKE 1 TABLET BY MOUTH TWICE A DAY Patient not taking: Reported on 05/17/2017 03/30/17   Lin Landsman, MD  polyethylene glycol (MIRALAX / Floria Raveling) packet Take 17 g by mouth daily as needed for mild constipation. Patient not taking: Reported on 05/17/2017 04/07/17   Fritzi Mandes, MD  rifaximin (XIFAXAN) 550 MG TABS tablet Take 1 tablet (550 mg total) 2 (two) times daily by mouth. Patient not taking: Reported on 05/14/2017 05/13/17 06/12/17  Lin Landsman, MD    Inpatient Medications: Scheduled Meds: . aspirin  81 mg  Oral Daily  . chlorhexidine  15 mL Mouth Rinse BID  . Chlorhexidine Gluconate Cloth  6 each Topical Daily  . citalopram  10 mg Oral Daily  . diltiazem  60 mg Oral Q8H  . lactulose  20 g Oral BID  . mouth rinse  15 mL Mouth Rinse q12n4p  . metolazone  10 mg Oral Once  . metoprolol tartrate  5 mg Intravenous Q6H  . pantoprazole  40 mg Oral Q0600  . potassium chloride  40 mEq Oral BID  . rifaximin  550 mg Oral BID  . sodium chloride flush  10-40 mL Intracatheter Q12H   Continuous Infusions: . sodium chloride 250 mL (06/11/17 0600)  . albumin human    . cefTAZidime (FORTAZ)  IV Stopped (06/11/17 0640)  . furosemide (LASIX) infusion 10 mg/hr (06/11/17 1304)   PRN Meds: sodium chloride, ibuprofen, RESOURCE THICKENUP CLEAR, sodium chloride flush, white petrolatum  Allergies:    Allergies  Allergen Reactions  . Bacitracin-Neomycin-Polymyxin Other (See Comments)  . Codeine Other (See Comments)    Itching "she can take some forms of it" Itching "she can take some forms of it"  . Gabapentin Nausea Only  . Neosporin  [Neomycin-Bacitracin Zn-Polymyx]   . Pregabalin Other (See Comments)    stomach issues stomach issues    Social History:   Social History   Socioeconomic History  . Marital status: Married    Spouse name: Not on file  . Number of children: Not on file  . Years of education: Not on file  . Highest education level: Not on file  Social Needs  . Financial resource strain: Not on file  . Food insecurity - worry: Not on file  . Food insecurity - inability: Not on file  . Transportation needs - medical: Not on file  . Transportation needs - non-medical: Not on file  Occupational History  . Not on file  Tobacco Use  . Smoking status: Former Smoker    Packs/day: 0.25    Years: 50.00    Pack years: 12.50  Types: Cigarettes    Last attempt to quit: 03/20/2017    Years since quitting: 0.2  . Smokeless tobacco: Never Used  Substance and Sexual Activity  . Alcohol  use: No  . Drug use: No  . Sexual activity: Not Currently  Other Topics Concern  . Not on file  Social History Narrative   Lives in [place] with husband.  Does not routinely exercise.    Family History:    Family History  Problem Relation Age of Onset  . Heart disease Mother   . Hypertension Mother   . Hypertension Father   . Heart disease Sister 39       stent   . Heart attack Sister   . Hyperlipidemia Sister   . Hypertension Sister   . Heart attack Maternal Uncle 64  . Heart disease Maternal Uncle        CABG     ROS:  Please see the history of present illness.  ROS  General:no colds or fevers, + weight changes Skin:no rashes or ulcers, + bruises since admit HEENT:no blurred vision, no congestion CV:see HPI PUL:see HPI GI:no diarrhea constipation or melena, no indigestion GU:no hematuria, no dysuria MS:no joint pain, no claudication Neuro:no syncope, no lightheadedness, AMS on admit and yesterday Endo:no diabetes, no thyroid disease    Physical Exam/Data:   Vitals:   06/11/17 1000 06/11/17 1018 06/11/17 1100 06/11/17 1200  BP:  (!) 121/55 (!) 111/58 (!) 129/38  Pulse: 78 79 82 81  Resp: 20 (!) 21 (!) 23 20  Temp:      TempSrc:      SpO2: 91% 92% 91% 94%  Weight:      Height:        Intake/Output Summary (Last 24 hours) at 06/11/2017 1319 Last data filed at 06/11/2017 1200 Gross per 24 hour  Intake 2219 ml  Output 3377 ml  Net -1158 ml   Filed Weights   06/08/17 1823 06/10/17 0500 06/11/17 0500  Weight: (!) 315 lb 0.6 oz (142.9 kg) (!) 310 lb 13.6 oz (141 kg) (!) 313 lb 0.9 oz (142 kg)   Body mass index is 47.6 kg/m.  General:  Well nourished, well developed, in no acute distress sleeping on BiPAP  HEENT: normal Lymph: no adenopathy Neck: no JVD Endocrine:  No thryomegaly Vascular: No carotid bruits; unable to palpate pedal pulses due to edema Cardiac:  normal S1, S2; RRR; no murmur gallup rub or click Lungs:  clear to auscultation  bilaterally-ant., no wheezing, rhonchi or rales  Abd: obese,soft, nontender, no hepatomegaly  Rectal tube in place with stool draining  Ext: 3+ edema from hip down I could not feel pitting edema in back Musculoskeletal:  No deformities, BUE and BLE moving all extremeties Skin: warm and dry  Neuro:  sleeping, no focal abnormalities noted Psych:  Normal affect     Relevant CV Studies:  ECHO from 08/28/16 Study Conclusions  - Left ventricle: The cavity size was mildly dilated. Systolic   function was normal. The estimated ejection fraction was in the   range of 60% to 65%. Wall motion was normal; there were no   regional wall motion abnormalities. Findings consistent with left   ventricular diastolic dysfunction. - Mitral valve: There was mild regurgitation. - Left atrium: The atrium was moderately dilated. - Right ventricle: Systolic function was normal. - Pulmonary arteries: Systolic pressure was moderately elevated. PA   peak pressure: 51 mm Hg (S).  ECHO 06/05/17 Study Conclusions  - Left  ventricle: The cavity size was normal. Wall thickness was   increased in a pattern of mild LVH. Systolic function was   vigorous. The estimated ejection fraction was in the range of 65%   to 70%. Wall motion was normal; there were no regional wall   motion abnormalities. There was no evidence of elevated   ventricular filling pressure by Doppler parameters. - Mitral valve: There was mild regurgitation. - Left atrium: The atrium was mildly dilated. - Right atrium: The atrium was mildly dilated.  Impressions:  - Compared to the prior study, there has been no significant   interval change.  HISTORY of NUC STUDY  06/11/16 no significant  ischemia Normal wall motion, EF estimated at 67% No EKG changes concerning for ischemia at peak stress or in recovery. Low risk scan  Laboratory Data:  Chemistry Recent Labs  Lab 06/10/17 0247 06/10/17 2038 06/11/17 0547  NA 147* 142 142  K  3.0* 2.8* 3.3*  CL 115* 110 108  CO2 25 25 27   GLUCOSE 193* 190* 138*  BUN 18 18 19   CREATININE 0.90 1.09* 1.13*  CALCIUM 8.7* 8.3* 8.5*  GFRNONAA >60 53* 51*  GFRAA >60 >60 59*  ANIONGAP 7 7 7     Recent Labs  Lab 06/05/17 1609 06/08/17 0426  PROT 5.8* 5.8*  ALBUMIN 2.3* 2.6*  AST 47* 41  ALT 25 28  ALKPHOS 131* 95  BILITOT 8.2* 10.5*   Hematology Recent Labs  Lab 06/08/17 0426 06/09/17 0500 06/11/17 0547  WBC 7.4 11.6* 25.1*  RBC 2.07* 2.09* 2.27*  HGB 7.9* 8.2* 8.8*  HCT 22.9* 23.9* 25.8*  MCV 111.2* 114.4* 113.7*  MCH 38.3* 39.2* 38.8*  MCHC 34.5 34.3 34.1  RDW NOT CALCULATED NOT CALCULATED NOT CALCULATED  PLT 63* 65* 52*   Cardiac Enzymes Recent Labs  Lab 05/08/2017 1956 06/05/17 0029  TROPONINI 0.15* 0.20*    Recent Labs  Lab 05/09/2017 1439  TROPIPOC 0.33*    BNPNo results for input(s): BNP, PROBNP in the last 168 hours.  DDimer No results for input(s): DDIMER in the last 168 hours.  Radiology/Studies:  Dg Chest Port 1 View  Result Date: 06/11/2017 CLINICAL DATA:  Acute respiratory failure with hypoxia. EXAM: PORTABLE CHEST 1 VIEW COMPARISON:  Radiograph June 10, 2017. FINDINGS: Stable cardiomegaly. Increased diffuse interstitial densities are noted throughout both lungs consistent with edema or pneumonia. No pneumothorax or pleural effusion is noted. Right internal jugular catheter is unchanged in position. Bony thorax is unremarkable. IMPRESSION: Increased bilateral diffuse interstitial densities are noted consistent with worsening edema or pneumonia. Electronically Signed   By: Marijo Conception, M.D.   On: 06/11/2017 07:38   Dg Chest Port 1 View  Result Date: 06/10/2017 CLINICAL DATA:  Respiratory failure EXAM: PORTABLE CHEST 1 VIEW COMPARISON:  Portable chest x-ray of 06/09/2017 FINDINGS: There is little change in airspace disease diffusely most consistent with edema although pneumonia cannot be excluded. No definite effusion is seen. Mild  cardiomegaly is stable. Right IJ central venous line remains unchanged in position. IMPRESSION: Little change in diffuse airspace disease consistent with edema or pneumonia. Electronically Signed   By: Ivar Drape M.D.   On: 06/10/2017 08:00   Dg Chest Port 1 View  Result Date: 06/09/2017 CLINICAL DATA:  Respiratory failure EXAM: PORTABLE CHEST 1 VIEW COMPARISON:  06/07/2017 FINDINGS: Cardiac shadow is mildly enlarged but stable. Right jugular central line is noted. Endotracheal tube and nasogastric catheter have been removed in the interval. Diffuse increased pulmonary edema  changes are noted bilaterally when compare with the prior exam. No new focal infiltrate is seen. No bony abnormality is noted. IMPRESSION: Increasing changes of pulmonary edema bilaterally. Electronically Signed   By: Inez Catalina M.D.   On: 06/09/2017 09:25    Assessment and Plan:   1. Acute respiratory failure with intubation and vent until 06/07/17 then extubated, now with repeat respiratory distress on Bipap.  Is a partial code, followed by CCM 2. Sepsis possible due to peritonitis cirrhosis on ABX and has been on Neo followed by CCM  WBC today 25 3. Elevated troponin on admit due to acute illness, demand ischemia pk tropinin 0.33.  Neg nuc for ischemia 2017  4. PAF pt has been in and out on IV lopressor and po dilt.  Unable to use amiodarone due to liver issues. Chadsvasc 3 but with plts of 52 hold anticoagulation.  May be able to add lanoxin but currently maintaining SR with pacs for last 24 hours.    5. Chronic dHF on IV lasix - some pul edema on CXR vs PNA.  Neg 6,307 wt has been pk of 323 to 310 lbs today 313 lbs. Edema 3+ at least to above hips on IV lasix.  Is receiving albumin also on zaroxolyn. 6. Hypokalemia with replacement  7. hypomagnesium with mg+ today 1.6 8. Anemia with hgb at 8.8 today  9. Thrombocytopenia with plts at 52 today 10. CKD-3 with Cr 1.13   For questions or updates, please contact [place] Please consult [email or web address] for contact info under Cardiology/STEMI.   Signed, Cecilie Kicks, NP  06/11/2017 1:19 PM   As above, patient seen and examined.  Briefly she is a 61 year old female with history of chronic diastolic congestive heart failure, hypertension, hyperlipidemia, Wilson's disease with cirrhosis, portal hypertension, chronic hepatitis for evaluation of paroxysmal atrial fibrillation.  Last echocardiogram December 2018 showed normal LV systolic function, mild mitral regurgitation and mild biatrial enlargement.  Patient apparently is being evaluated for possible liver transplant.  She was admitted on November 30 with altered mental status and confusion.  She has been treated for possible sepsis.  She was also found to have an ammonia of 163.  She was found to have atrial fibrillation and cardiology asked to evaluate.  On examination she is obese and on BiPAP.  She has diffuse ecchymosis and dressings for skin tears.  She has scleral icterus.  She has anasarca. Electrocardiogram from December 5 shows atrial fibrillation with PVCs or aberrantly conducted beats.  Nonspecific ST changes.  CT December 2 showed cirrhosis with portal hypertension and splenomegaly.  Laboratory significant for creatinine 1.13, white blood cell count 25,000, hemoglobin 8.8 and platelet count 52,000.  Last albumin 2.6. Troponin 0.33, 0.15 and 0.20.  1 paroxysmal atrial fibrillation-patient is in sinus rhythm at present.  She has not had any atrial fibrillation in the past 24 hours upon my personal review of her telemetry.  We will continue with present medications for rate control  including metoprolol and Cardizem.  Would transition  metoprolol to oral form when able.  We cannot advance AV nodal blocking agents as her blood pressure is tenuous.  If she has recurrent atrial fibrillation with elevated rate would consider adding digoxin.  Our only other option would be amiodarone which would likely be  contraindicated given severity of liver disease.  Chadsvasc is 2 for hypertension and female sex.  She would benefit from anticoagulation long-term to reduce risk of embolic event.  However she has diffuse  ecchymoses, thrombocytopenia and cirrhosis with portal hypertension.  I think the risk of anticoagulation outweighs benefit.  Would therefore avoid.  Check TSH.  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  She is being considered for transplant.  3 anasarca-this is likely related to liver disease including portal hypertension and low albumin.  Continue lasix drip.  4 mildly elevated troponin-patient is not having chest pain.  No clear trend.  I do not think this is consistent with an acute coronary syndrome.  No further ischemia evaluation.  5 Acute respiratory failure- previously intubated now on BiPAP.  Management per critical care medicine.  6 Limited CODE BLUE  Kirk Ruths, MD

## 2017-06-11 NOTE — Progress Notes (Signed)
[facility] Progress Note    Since last GI note: She is having trouble breathing this morning. Still fairly confused.    Objective: Vital signs in last 24 hours: Temp:  [97.2 F (36.2 C)-98 F (36.7 C)] 98 F (36.7 C) (12/07 0755) Pulse Rate:  [38-102] 79 (12/07 1018) Resp:  [15-29] 21 (12/07 1018) BP: (80-133)/(43-87) 121/55 (12/07 1018) SpO2:  [88 %-100 %] 92 % (12/07 1018) Weight:  [313 lb 0.9 oz (142 kg)] 313 lb 0.9 oz (142 kg) (12/07 0500) Last BM Date: 06/11/17(felxi seal) General: alert and oriented times 2 Heart: regular rate and rythm Abdomen: soft, non-tender, non-distended, normal bowel sounds EXT massive lower extrem edema  Lab Results: Recent Labs    06/09/17 0500 06/11/17 0547  WBC 11.6* 25.1*  HGB 8.2* 8.8*  PLT 65* 52*  MCV 114.4* 113.7*   Recent Labs    06/10/17 0247 06/10/17 2038 06/11/17 0547  NA 147* 142 142  K 3.0* 2.8* 3.3*  CL 115* 110 108  CO2 25 25 27   GLUCOSE 193* 190* 138*  BUN 18 18 19   CREATININE 0.90 1.09* 1.13*  CALCIUM 8.7* 8.3* 8.5*   No results for input(s): PROT, ALBUMIN, AST, ALT, ALKPHOS, BILITOT, BILIDIR, IBILI in the last 72 hours. Recent Labs    06/10/17 0247  INR 2.51    Medications: Scheduled Meds: . aspirin  81 mg Oral Daily  . chlorhexidine  15 mL Mouth Rinse BID  . Chlorhexidine Gluconate Cloth  6 each Topical Daily  . citalopram  10 mg Oral Daily  . diltiazem  60 mg Oral Q8H  . lactulose  20 g Oral BID  . mouth rinse  15 mL Mouth Rinse q12n4p  . metoprolol tartrate  5 mg Intravenous Q6H  . pantoprazole  40 mg Oral Q0600  . potassium chloride  40 mEq Oral BID  . rifaximin  550 mg Oral BID  . sodium chloride flush  10-40 mL Intracatheter Q12H   Continuous Infusions: . sodium chloride 250 mL (06/11/17 0600)  . cefTAZidime (FORTAZ)  IV Stopped (06/11/17 0640)  . dextrose 50 mL (06/11/17 0610)  . furosemide (LASIX) infusion 8 mg/hr (06/11/17 0600)  . vancomycin Stopped (06/10/17 2101)    PRN Meds:.sodium chloride, ibuprofen, RESOURCE THICKENUP CLEAR, sodium chloride flush, white petrolatum    Assessment/Plan: 61 y.o. female with decompensated cirrhosis, fluid overload, pulm edema, Afib with intermittent RVR  She is critically ill.  Currently very fluid overloaded.  Afib probably contributes but some fluid overload certainly from her liver disease.  I will leave diuresis to CCM (currently on a lasix drip).  She was admitted with sepsis picture but no clear infectious source has been identified.  She is on empiric broad spectrum abx.  Mental status is better than admit, still obviously encephalopathic.  Should continue xifaxin BID  and lactulose twice daily as well.  I understand cardiology will be seeing her in consultation about her afib.    Will follow along, available by pager this weekend.  Otherwise will see her again on Monday.  Milus Banister, MD  06/11/2017, 11:33 AM [facility] Pager [phone]

## 2017-06-11 NOTE — Progress Notes (Signed)
Nutrition Follow-up  DOCUMENTATION CODES:   Morbid obesity  INTERVENTION:   Continue:   Magic cup TID with meals, each supplement provides 290 kcal and 9 grams of protein  Hormel Shake BID with meals, each supplement provides 480-500 kcals and 20-23 grams of protein  NUTRITION DIAGNOSIS:   Inadequate oral intake related to acute illness, dysphagia as evidenced by meal completion < 50%, per patient/family report.  Ongoing  GOAL:   Patient will meet greater than or equal to 90% of their needs  Unmet  MONITOR:   PO intake, Supplement acceptance, Diet advancement, Labs, I & O's  ASSESSMENT:   61 y.o. female with PMH as outlined below including cirrhosis, chronic hepatitis, portal hypertension, recently diagnosed Wilson's disease (mildly elevated ceruloplasmin, 24 hr urinary copper, liver biopsy copper via transjugular liver biopsy 05/07/17, NO [employer] rings on ophthalmology eval).  She is followed by Dr. Marius Ditch at [facility] and she is apparently being evaluated for liver transplant at [facility]. She presented [facility] 11/30 with acute encephalopathy and sepsis of unclear etiology.  She was subsequently admitted to the ICU for further eval and management.  Patient was extubated on 12/3. Diet was advanced to dysphagia 2 with nectar thick liquids 12/5, liquids upgraded to thin consistency 12/6. Patient has been eating poorly due to consistency restrictions (finely chopped foods) and her own restrictions that she was following at home (low sodium) for upcoming visit with liver transplant doctor. Encouraged patient to discuss menu options with [employer] to ensure she is not receiving items that she avoids at home. Spoke with sister at bedside as well.  Labs and medications reviewed.   Diet Order:  DIET DYS 2 Room service appropriate? Yes; Fluid consistency: Thin  EDUCATION NEEDS:   Education needs have been addressed  Skin:  Skin Assessment: Reviewed  RN Assessment  Last BM:  12/7 (rectal tube)  Height:   Ht Readings from Last 1 Encounters:  06/08/17 5\' 8"  (1.727 m)    Weight:   Wt Readings from Last 1 Encounters:  06/11/17 (!) 313 lb 0.9 oz (142 kg)    Ideal Body Weight:  63.64 kg  BMI:  Body mass index is 47.6 kg/m.  Estimated Nutritional Needs:   Kcal:  1900-2100  Protein:  125 gm  Fluid:  2 L   Molli Barrows, RD, LDN, CNSC Pager [phone] After Hours Pager [phone]

## 2017-06-11 NOTE — Care Management Note (Signed)
Case Management Note  Patient Details  Name: Leah Shah MRN: [id number] Date of Birth: December 31, 1955  Subjective/Objective:  Pt admitted for AMS       Action/Plan:   PTA independent from home with falls - recommendation is for HHPT per [facility] therapy. Pt extubated 12/3 but requiring BIPAP at the time of this note.   Pt remains confused - CM will follow back to give [facility] choice at a later time.   Expected Discharge Date:                  Expected Discharge Plan:  [place]  In-House Referral:     Discharge planning Services  CM Consult  Post Acute Care Choice:    Choice offered to:  Patient  DME Arranged:    DME Agency:     HH Arranged:    [place] Agency:     Status of Service:  In process, will continue to follow  If discussed at Long Length of Stay Meetings, dates discussed:    Additional Comments:  Maryclare Labrador, RN 06/11/2017, 4:29 PM

## 2017-06-11 NOTE — Progress Notes (Signed)
  Speech Language Pathology Treatment: Dysphagia  Patient Details Name: Leah Shah MRN: [id number] DOB: [birth year] Today's Date: 06/11/2017 Time: 7517-0017 SLP Time Calculation (min) (ACUTE ONLY): 24 min  Assessment / Plan / Recommendation Clinical Impression  Pt is very eager for diet advancement but also has limited awareness of her current functional status - perseverating on being ready to go home and wanting to get there before the snow. She was eager to try a bite of graham cracker but after very prolonged mastication she was unable to swallow all of it and needed to expectorate it. SLP also suggested a liquid wash to facilitate clearance, but a small sip while the cracker was in her mouth resulted in an immediate cough. Thin liquids otherwise did not elicit overt signs of aspiration. Pt attributed her difficulty with the cracker to "not wanting it" even though she had been excited to try it initially. Pt is not yet ready for diet advancement. Would continue with current diet and precautions. SLP reviewed diet textures and types of food that could be selected.   HPI HPI: Pt is a 60 y.o.femalewith PMH including NASH cirrhosis, chronic hepatitis, portal hypertension, and Wilson's disease. She is followed by Dr. Marius Ditch at [facility] and she is apparently being evaluated for liver transplant at [facility]. She presented 11/30 with several days of AMS and fall on day of admission. Per MD note, differential dx includes sepsis (concern for possible PNA) versus decompensated cirrhosis and organ failure in setting of noncompliance with meds, including lactulose. Pt requried intubation 12/1-12/3.      SLP Plan  Continue with current plan of care       Recommendations  Diet recommendations: Dysphagia 2 (fine chop);Thin liquid Liquids provided via: Cup;No straw Medication Administration: Whole meds with puree Supervision: Patient able to self feed;Full supervision/cueing for compensatory  strategies Compensations: Minimize environmental distractions;Slow rate;Small sips/bites Postural Changes and/or Swallow Maneuvers: Seated upright 90 degrees                Oral Care Recommendations: Oral care BID Follow up Recommendations: Home health SLP;24 hour supervision/assistance SLP Visit Diagnosis: Dysphagia, unspecified (R13.10) Plan: Continue with current plan of care       GO                Germain Osgood 06/11/2017, 9:17 AM  Germain Osgood, M.A. CCC-SLP [phone]

## 2017-06-11 NOTE — Progress Notes (Signed)
PULMONARY / CRITICAL CARE MEDICINE   Name: Leah Shah MRN: [id number] DOB: 17-Oct-1955    ADMISSION DATE:  05/29/2017 CONSULTATION DATE: 05/23/2017  REFERRING MD:  Eulis Foster  CHIEF COMPLAINT:  AMS  HISTORY OF PRESENT ILLNESS:   Leah Shah is a 61 y.o. female with PMH of cirrhosis, chronic hepatitis, portal hypertension, recently diagnosed Wilson's disease (mildly elevated ceruloplasmin, 24 hr urinary copper, liver biopsy copper via transjugular liver biopsy 05/07/17, NO Beatriz Chancellor rings on ophthalmology eval).  She is followed by Dr. Marius Ditch at [facility] and she is apparently being evaluated for liver transplant at [facility].  She presented to [facility] ED 11/30 with altered mental status.  She has apparently been confused for the past several days per her daughter-in-law.  Daughter-in-law and additional family had been trying to convince her to move in with family so that they could help her with daily activities and to ensure that she would take her medications as prescribed.  Patient has apparently not been good about taking her medicines as prescribed and misses several doses on a daily basis.  Due to this, she was supposed to move in with daughter-in-law on 11/30.  When daughter-in-law called her that morning, there was no answer despite several calls.  Daughter-in-law then went to patient's home and found her on the floor sitting next to the chair.  Patient had told her that she apparently had a fall, was able to tell that she did not hit her head or lose consciousness. She had not had any recent complaints and had reportedly been in her usual state of health besides for confusion (felt to be due to missing lactulose), and decreased PO intake.  In ED, she was found to be encephalopathic, hypotensive, hypothermic.  Potassium was greater than 7.5 (she received temporizing measures), ammonia 163, lactate 4.13 (repeat 4.82 > 1.91).  PCCM consulted for admission  Of note, she has had extensive  workup of her liver disease including normal iron & ferritin, Hepatitis total A Ab positive, Hepatitis B surface Ag negative, Hepatitis B surface Ab mildly positive,& Hepatitis C Ab negative, HIV negative, ANA negative, AMA & anti-SMA negative, LKM ab negative, Immunoglobulins normal, A-1 antitrypsin normal, celiac serology negative.  As mentioned above, she recently had mildly elevated ceruloplasmin, 24 hr urinary copper, liver biopsy copper via transjugular liver biopsy 05/07/17, NO [employer] rings on ophthalmology eval.   SUBJECTIVE:  RN reports increasing O2 needs, now on 8L.  Pt states she is short of breath.  RN reports pt in / out of AF.    VITAL SIGNS: BP (!) 111/58   Pulse 82   Temp 98 F (36.7 C) (Oral)   Resp (!) 23   Ht 5\' 8"  (1.727 m)   Wt (!) 313 lb 0.9 oz (142 kg)   SpO2 91%   BMI 47.60 kg/m   HEMODYNAMICS:    VENTILATOR SETTINGS:    INTAKE / OUTPUT: I/O last 3 completed shifts: In: 2604.1 [I.V.:1554.1; IV Piggyback:1050] Out: 6073 [XTGGY:6948; Stool:800]  PHYSICAL EXAMINATION:  General: chronically ill appearing female lying in bed   HEENT: MM pink/moist, jaundice, bruising to neck/chest Neuro: Awake/alert, oriented but has situational confusion, MAE  CV: s1s2 rrr, no m/r/g.  Intermittent AF on monitor.   PULM: even/non-labored, lungs bilaterally coarse with faint crackles  NI:OEVO, non-tender, bsx4 active  Extremities: warm/dry, generalized anasarca   Skin: thin skin, jaundice, multiple bruises   LABS:  BMET Recent Labs  Lab 06/10/17 0247 06/10/17 2038 06/11/17 0547  NA 147* 142 142  K 3.0* 2.8* 3.3*  CL 115* 110 108  CO2 25 25 27   BUN 18 18 19   CREATININE 0.90 1.09* 1.13*  GLUCOSE 193* 190* 138*    Electrolytes Recent Labs  Lab 06/09/17 0500 06/10/17 0247 06/10/17 2038 06/11/17 0547  CALCIUM 8.5* 8.7* 8.3* 8.5*  MG 1.6* 1.9  --  1.6*  PHOS 2.9 2.5  --  2.8    CBC Recent Labs  Lab 06/08/17 0426 06/09/17 0500 06/11/17 0547   WBC 7.4 11.6* 25.1*  HGB 7.9* 8.2* 8.8*  HCT 22.9* 23.9* 25.8*  PLT 63* 65* 52*    Coag's Recent Labs  Lab 06/05/17 0527 06/08/17 0426 06/10/17 0247  INR 1.88 2.36 2.51    Sepsis Markers Recent Labs  Lab 06/09/17 1508  06/09/17 2114 06/10/17 0247 06/10/17 0835 06/11/17 0547  LATICACIDVEN  --    < > 4.7* 3.6* 2.8*  --   PROCALCITON 0.10  --   --  0.14  --  0.21   < > = values in this interval not displayed.    ABG Recent Labs  Lab 06/05/17 0635 06/05/17 0853 06/09/17 0858  PHART 7.359 7.396 7.453*  PCO2ART 37.4 35.1 36.6  PO2ART 69.9* 409* 49.1*    Liver Enzymes Recent Labs  Lab 05/16/2017 1300 06/05/17 1609 06/08/17 0426  AST 59* 47* 41  ALT 26 25 28   ALKPHOS 148* 131* 95  BILITOT 7.6* 8.2* 10.5*  ALBUMIN 2.5* 2.3* 2.6*    Cardiac Enzymes Recent Labs  Lab 05/20/2017 1956 06/05/17 0029  TROPONINI 0.15* 0.20*    Glucose Recent Labs  Lab 06/10/17 1125 06/10/17 1525 06/10/17 1950 06/10/17 2335 06/11/17 0318 06/11/17 0752  GLUCAP 171* 175* 182* 216* 183* 125*    Imaging Dg Chest Port 1 View  Result Date: 06/11/2017 CLINICAL DATA:  Acute respiratory failure with hypoxia. EXAM: PORTABLE CHEST 1 VIEW COMPARISON:  Radiograph June 10, 2017. FINDINGS: Stable cardiomegaly. Increased diffuse interstitial densities are noted throughout both lungs consistent with edema or pneumonia. No pneumothorax or pleural effusion is noted. Right internal jugular catheter is unchanged in position. Bony thorax is unremarkable. IMPRESSION: Increased bilateral diffuse interstitial densities are noted consistent with worsening edema or pneumonia. Electronically Signed   By: Marijo Conception, M.D.   On: 06/11/2017 07:38    STUDIES:  CXR 11/30 > chronic interstitial disease. CXR 12/1> ETT in place, No acute changes Ultrasound abdomen 12/1> cirrhotic liver, minimal ascites Echocardiogram 12/1> mild LVH, LVEF 65-70%. CT chest abd/pelv 12/2 > possible multifocal PNA vs  asymmetric pulmonary edema CT Chest 12/7 >>   CULTURES:  Blood 11/30 >> negative  Urine 11/30 >> negative  ANTIBIOTICS: Vanc 11/30 >> 12/7 Zosyn 11/30 >> 12/5  Fortaz 12/5 >> 12/8  SIGNIFICANT EVENTS: 11/30  Admit 12/05  Tx to ICU for AFwRVR, increased WOB   12/07  Increased O2 needs, CXR with worsening edema  LINES/TUBES: ETT 12/1 > 12/3 CVL Rt IJ 12/1> Radial a line 11/30 > 12/1  DISCUSSION: 61 y.o. female with PMH including NASH cirrhosis, chronic hepatitis, portal hypertension,Wilson's disease. She is followed by Dr. Marius Ditch at [facility] and she is apparently being evaluated for liver transplant at [facility].  She presented [facility] 11/30 with acute encephalopathy and concern for sepsis of unclear etiology.  She was subsequently admitted to the ICU for further eval and management.  Transferred to the floor on 06/08/2017 and during the early a.m. of 06/09/2017 developed atrial fibrillation rapid ventricular response along  with hypotension.  She was placed on diltiazem drip with some improvement in her heart rate.  She is dyspneic with any exertion.  Stat portable chest x ray revealed frank pulmonary edema.  Due to her multiple medical problems, morbid obesity, pulmonary edema with recent extubation on 06/07/2017, she will be returned to the intensive care for further evaluation and treatment.  She may need noninvasive mechanical ventilatory support and/or intubation.  ASSESSMENT / PLAN:  PULMONARY A: Acute resp failure - s/p intubation, extubated 12/3 and tolerated well Acute Respiratory Distress - 12/4, in setting of AFwRVR P:   O2 to support sats > 90% PRN BiPAP for increased work of breathing  Confirmed with family / patient that she would want reintubation.  See below Pulmonary hygiene - IS, mobilize  Follow CXR  Repeat CT chest w/o  See ID  CARDIOVASCULAR A:  Sepsis - of unclear etiology, possible peritonitis in setting ascites / cirrhosis.  A repeat sepsis assessment  has been performed. Troponin bump - due to above. Hx HTN, HLD, chronic dCHF (echo from Feb 2018 with EF 46-96%, LV diastolic dysfunction, mild MR, mod LA dilation). P:  ICU monitoring  Continue ASA Lasix gtt at 8 mg/hr  Continue IV lopressor  Cardiology consult with intermittent AF despite cardizem  Hold home atenolol, spironolactone  RENAL A:   Hyponatremia - presumed due to hypovolemia, cirrhosis.  Resolved. Hypernatremia  Hyperkalemia - s/p temporizing measures in ED.   Resolved. NAGMA - resolved. AKI - improving. Hypo-magnesium P:   KVO IVF  Lasix as above Metalozone 10 mg x1  Consider restart of spironolactone in am 12/8 (monitor BP effect) Albumin 25g Q6 x4 doses  Trend BMP / urinary output Replace electrolytes as indicated Avoid nephrotoxic agents, ensure adequate renal perfusion  GASTROINTESTINAL A:   Hx NASH cirrhosis, chronic hepatitis, recent dx Wilson's disease (followed by Dr. Marius Ditch at [facility]) - reportedly being worked up for transplant at [facility]. Hyperbilirubinemia - chronic, due to above. No ascites noted on abd ultrasound Dysphagia - failed swallow eval 12/4 P:   GI following, appreciate input  Continue protonix Continue lactulose, rifaximin  SLP following > D2 diet  HEMATOLOGIC A:   Anemia - chronic, s/p 1u PRBC transfusion 12/2 Thrombocytopenia - chronic. VTE Prophylaxis. P:  Follow CBC  Monitor for bleeding SCD's for DVT prophylaxis  Repeat INR in am   INFECTIOUS A:   Sepsis - of unclear etiology, possible peritonitis in setting ascites / cirrhosis.  Ultrasound with minimal peritoneal fluid making peritonitis unlikely.  Possible multifocal PNA based on CT chest.    P:   Discontinue vanco  Add stop date to [facility] for 12/8 If new fever, reculture  ENDOCRINE A: No acute issues. P: Monitor glucose on BMP   NEUROLOGIC A:   Acute encephalopathy - presumed multifactorial in setting hepatic.  Soft encephalopathy + sepsis. Hx headache,  anxiety. P:   Continue lacutlose, rifaximin Avoid sedating medications PT efforts Continue citalopram  Hold home hydroxyzine, Oxy IR, trazodone (PRN), cyclobenzaprine, PRN xanax Monitor for benzo withdrawal  Family updated:  Sister updated at bedside.  Goals of care discussed with patient and family.  Agree for no CPR, defib.  Ok for short term intubation only.    CC Time:  68 minutes   Noe Gens, NP-C [facility] Pgr: [phone] or if no answer [phone] 06/11/2017, 12:08 PM  Attending Note:  61 year old female with liver failure and wilson disease who presents to [facility] with  anasarca, fluid overload and respiratory failure.  On exam, diffuse edema and crackles noted.  I reviewed CXR myself, worsening edema.  Will begin BiPAP while asleep.  Titrate O2 for sat of 88-92%.  Increase lasix drip to 10 mg/hr, zaroxolyn 10 mg, spirinolactone and albumin.  Replace electrolytes.  Spoke with the patient at length with the sister bedside.  After discussion, code status change to LCB with no CPR/cardioversion/trach/peg.  The patient is critically ill with multiple organ systems failure and requires high complexity decision making for assessment and support, frequent evaluation and titration of therapies, application of advanced monitoring technologies and extensive interpretation of multiple databases.   Critical Care Time devoted to patient care services described in this note is  35  Minutes. This time reflects time of care of this signee Dr Jennet Maduro. This critical care time does not reflect procedure time, or teaching time or supervisory time of PA/NP/Med student/Med Resident etc but could involve care discussion time.  Rush Farmer, M.D. [facility]/Critical Care Medicine. Pager: [phone]. After hours pager: [phone].

## 2017-06-12 ENCOUNTER — Encounter (HOSPITAL_COMMUNITY): Payer: Self-pay

## 2017-06-12 ENCOUNTER — Inpatient Hospital Stay (HOSPITAL_COMMUNITY): Payer: Medicare HMO

## 2017-06-12 DIAGNOSIS — R112 Nausea with vomiting, unspecified: Secondary | ICD-10-CM

## 2017-06-12 LAB — COMPREHENSIVE METABOLIC PANEL
ALT: 21 U/L (ref 14–54)
ANION GAP: 11 (ref 5–15)
AST: 37 U/L (ref 15–41)
Albumin: 2.7 g/dL — ABNORMAL LOW (ref 3.5–5.0)
Alkaline Phosphatase: 70 U/L (ref 38–126)
BUN: 23 mg/dL — AB (ref 6–20)
CHLORIDE: 104 mmol/L (ref 101–111)
CO2: 28 mmol/L (ref 22–32)
Calcium: 8.5 mg/dL — ABNORMAL LOW (ref 8.9–10.3)
Creatinine, Ser: 1.22 mg/dL — ABNORMAL HIGH (ref 0.44–1.00)
GFR, EST AFRICAN AMERICAN: 54 mL/min — AB (ref >60)
GFR, EST NON AFRICAN AMERICAN: 46 mL/min — AB (ref >60)
Glucose, Bld: 151 mg/dL — ABNORMAL HIGH (ref 65–99)
POTASSIUM: 2.5 mmol/L — AB (ref 3.5–5.1)
Sodium: 143 mmol/L (ref 135–145)
Total Bilirubin: 6.9 mg/dL — ABNORMAL HIGH (ref 0.3–1.2)
Total Protein: 5.1 g/dL — ABNORMAL LOW (ref 6.5–8.1)

## 2017-06-12 LAB — GLUCOSE, CAPILLARY
Glucose-Capillary: 147 mg/dL — ABNORMAL HIGH (ref 65–99)
Glucose-Capillary: 155 mg/dL — ABNORMAL HIGH (ref 65–99)
Glucose-Capillary: 161 mg/dL — ABNORMAL HIGH (ref 65–99)
Glucose-Capillary: 166 mg/dL — ABNORMAL HIGH (ref 65–99)
Glucose-Capillary: 167 mg/dL — ABNORMAL HIGH (ref 65–99)
Glucose-Capillary: 175 mg/dL — ABNORMAL HIGH (ref 65–99)
Glucose-Capillary: 203 mg/dL — ABNORMAL HIGH (ref 65–99)

## 2017-06-12 LAB — MAGNESIUM
Magnesium: 1.5 mg/dL — ABNORMAL LOW (ref 1.7–2.4)
Magnesium: 2 mg/dL (ref 1.7–2.4)

## 2017-06-12 LAB — BLOOD GAS, ARTERIAL
ACID-BASE EXCESS: 1.9 mmol/L (ref 0.0–2.0)
Acid-Base Excess: 1.6 mmol/L (ref 0.0–2.0)
Bicarbonate: 29.2 mmol/L — ABNORMAL HIGH (ref 20.0–28.0)
Bicarbonate: 29 mmol/L — ABNORMAL HIGH (ref 20.0–28.0)
DRAWN BY: 519031
Drawn by: 519031
FIO2: 100
FIO2: 100
LHR: 26 {breaths}/min
O2 SAT: 84.8 %
O2 SAT: 94 %
PATIENT TEMPERATURE: 98.3
PCO2 ART: 73.5 mmHg — AB (ref 32.0–48.0)
PEEP: 10 cmH2O
PEEP: 14 cmH2O
PH ART: 7.219 — AB (ref 7.350–7.450)
PO2 ART: 86.6 mmHg (ref 83.0–108.0)
Patient temperature: 98.3
RATE: 16 resp/min
VT: 550 mL
VT: 550 mL
pCO2 arterial: 80.9 mmHg (ref 32.0–48.0)
pH, Arterial: 7.182 — CL (ref 7.350–7.450)
pO2, Arterial: 66 mmHg — ABNORMAL LOW (ref 83.0–108.0)

## 2017-06-12 LAB — CBC
HEMATOCRIT: 21 % — AB (ref 36.0–46.0)
HEMOGLOBIN: 7.3 g/dL — AB (ref 12.0–15.0)
MCH: 39.5 pg — ABNORMAL HIGH (ref 26.0–34.0)
MCHC: 34.8 g/dL (ref 30.0–36.0)
MCV: 113.5 fL — AB (ref 78.0–100.0)
Platelets: 35 10*3/uL — ABNORMAL LOW (ref 150–400)
RBC: 1.85 MIL/uL — AB (ref 3.87–5.11)
WBC: 14.1 10*3/uL — AB (ref 4.0–10.5)

## 2017-06-12 LAB — BASIC METABOLIC PANEL
Anion gap: 10 (ref 5–15)
BUN: 28 mg/dL — AB (ref 6–20)
CHLORIDE: 104 mmol/L (ref 101–111)
CO2: 28 mmol/L (ref 22–32)
Calcium: 8.3 mg/dL — ABNORMAL LOW (ref 8.9–10.3)
Creatinine, Ser: 1.34 mg/dL — ABNORMAL HIGH (ref 0.44–1.00)
GFR calc Af Amer: 48 mL/min — ABNORMAL LOW (ref >60)
GFR calc non Af Amer: 41 mL/min — ABNORMAL LOW (ref >60)
GLUCOSE: 183 mg/dL — AB (ref 65–99)
POTASSIUM: 2.5 mmol/L — AB (ref 3.5–5.1)
Sodium: 142 mmol/L (ref 135–145)

## 2017-06-12 LAB — POCT I-STAT 3, ART BLOOD GAS (G3+)
Acid-Base Excess: 2 mmol/L (ref 0.0–2.0)
Bicarbonate: 28.4 mmol/L — ABNORMAL HIGH (ref 20.0–28.0)
O2 Saturation: 99 %
Patient temperature: 98.6
TCO2: 30 mmol/L (ref 22–32)
pCO2 arterial: 55.7 mmHg — ABNORMAL HIGH (ref 32.0–48.0)
pH, Arterial: 7.316 — ABNORMAL LOW (ref 7.350–7.450)
pO2, Arterial: 140 mmHg — ABNORMAL HIGH (ref 83.0–108.0)

## 2017-06-12 LAB — PROTIME-INR
INR: 2.75
PROTHROMBIN TIME: 28.9 s — AB (ref 11.4–15.2)

## 2017-06-12 LAB — AMMONIA: Ammonia: 28 umol/L (ref 9–35)

## 2017-06-12 LAB — PHOSPHORUS: PHOSPHORUS: 3.5 mg/dL (ref 2.5–4.6)

## 2017-06-12 MED ORDER — MAGNESIUM SULFATE 2 GM/50ML IV SOLN
2.0000 g | Freq: Once | INTRAVENOUS | Status: AC
  Administered 2017-06-12: 2 g via INTRAVENOUS
  Filled 2017-06-12: qty 50

## 2017-06-12 MED ORDER — ASPIRIN 81 MG PO CHEW
81.0000 mg | CHEWABLE_TABLET | Freq: Every day | ORAL | Status: DC
  Administered 2017-06-13 – 2017-06-14 (×2): 81 mg
  Filled 2017-06-12 (×2): qty 1

## 2017-06-12 MED ORDER — FENTANYL CITRATE (PF) 100 MCG/2ML IJ SOLN
100.0000 ug | Freq: Once | INTRAMUSCULAR | Status: AC
  Administered 2017-06-12: 100 ug via INTRAVENOUS

## 2017-06-12 MED ORDER — MIDAZOLAM HCL 2 MG/2ML IJ SOLN
2.0000 mg | INTRAMUSCULAR | Status: DC | PRN
  Administered 2017-06-12 – 2017-06-14 (×3): 2 mg via INTRAVENOUS
  Filled 2017-06-12: qty 2

## 2017-06-12 MED ORDER — POTASSIUM CHLORIDE 10 MEQ/50ML IV SOLN
10.0000 meq | INTRAVENOUS | Status: AC
  Administered 2017-06-12 (×4): 10 meq via INTRAVENOUS
  Filled 2017-06-12 (×4): qty 50

## 2017-06-12 MED ORDER — MIDAZOLAM HCL 2 MG/2ML IJ SOLN
2.0000 mg | INTRAMUSCULAR | Status: DC | PRN
  Filled 2017-06-12 (×2): qty 2

## 2017-06-12 MED ORDER — ALBUMIN HUMAN 25 % IV SOLN
25.0000 g | Freq: Four times a day (QID) | INTRAVENOUS | Status: AC
  Administered 2017-06-12 – 2017-06-13 (×4): 25 g via INTRAVENOUS
  Filled 2017-06-12 (×2): qty 50
  Filled 2017-06-12 (×3): qty 100

## 2017-06-12 MED ORDER — FENTANYL CITRATE (PF) 100 MCG/2ML IJ SOLN
INTRAMUSCULAR | Status: AC
  Filled 2017-06-12: qty 2

## 2017-06-12 MED ORDER — ACETAZOLAMIDE SODIUM 500 MG IJ SOLR
250.0000 mg | Freq: Four times a day (QID) | INTRAMUSCULAR | Status: AC
  Administered 2017-06-12 (×2): 250 mg via INTRAVENOUS
  Filled 2017-06-12 (×3): qty 250

## 2017-06-12 MED ORDER — POTASSIUM PHOSPHATES 15 MMOLE/5ML IV SOLN
30.0000 mmol | Freq: Once | INTRAVENOUS | Status: AC
  Administered 2017-06-12: 30 mmol via INTRAVENOUS
  Filled 2017-06-12: qty 10

## 2017-06-12 MED ORDER — PANTOPRAZOLE SODIUM 40 MG IV SOLR
40.0000 mg | INTRAVENOUS | Status: DC
  Administered 2017-06-13 – 2017-06-14 (×2): 40 mg via INTRAVENOUS
  Filled 2017-06-12 (×2): qty 40

## 2017-06-12 MED ORDER — ROCURONIUM BROMIDE 50 MG/5ML IV SOLN: 50.0000 mg | Freq: Once | INTRAVENOUS | Status: DC

## 2017-06-12 MED ORDER — FENTANYL BOLUS VIA INFUSION
50.0000 ug | INTRAVENOUS | Status: DC | PRN
  Filled 2017-06-12: qty 50

## 2017-06-12 MED ORDER — MIDAZOLAM HCL 2 MG/2ML IJ SOLN
INTRAMUSCULAR | Status: AC
Start: 2017-06-12 — End: 2017-06-13
  Filled 2017-06-12: qty 4

## 2017-06-12 MED ORDER — MIDAZOLAM HCL 2 MG/2ML IJ SOLN
2.0000 mg | Freq: Once | INTRAMUSCULAR | Status: AC
  Administered 2017-06-12: 2 mg via INTRAVENOUS

## 2017-06-12 MED ORDER — FENTANYL 2500MCG IN NS 250ML (10MCG/ML) PREMIX INFUSION
25.0000 ug/h | INTRAVENOUS | Status: DC
  Administered 2017-06-12: 150 ug/h via INTRAVENOUS
  Administered 2017-06-13: 300 ug/h via INTRAVENOUS
  Filled 2017-06-12 (×4): qty 250

## 2017-06-12 MED ORDER — METOPROLOL TARTRATE 5 MG/5ML IV SOLN: 5.0000 mg | Freq: Four times a day (QID) | INTRAVENOUS | Status: DC | PRN

## 2017-06-12 MED ORDER — POTASSIUM CHLORIDE 10 MEQ/50ML IV SOLN
10.0000 meq | INTRAVENOUS | Status: AC
  Administered 2017-06-12 – 2017-06-13 (×6): 10 meq via INTRAVENOUS
  Filled 2017-06-12 (×6): qty 50

## 2017-06-12 MED ORDER — FENTANYL CITRATE (PF) 100 MCG/2ML IJ SOLN
50.0000 ug | Freq: Once | INTRAMUSCULAR | Status: AC
Start: 2017-06-12 — End: 2017-06-12

## 2017-06-12 MED ORDER — DEXTROSE 5 % IV SOLN
10.0000 mg/h | INTRAVENOUS | Status: AC
  Filled 2017-06-12: qty 25

## 2017-06-12 MED ORDER — METOLAZONE 10 MG PO TABS
10.0000 mg | ORAL_TABLET | Freq: Once | ORAL | Status: AC
  Administered 2017-06-12: 10 mg via ORAL
  Filled 2017-06-12: qty 1

## 2017-06-12 MED ORDER — ETOMIDATE 2 MG/ML IV SOLN
20.0000 mg | Freq: Once | INTRAVENOUS | Status: AC
  Administered 2017-06-12: 20 mg via INTRAVENOUS

## 2017-06-12 NOTE — Progress Notes (Signed)
PULMONARY / CRITICAL CARE MEDICINE   Name: UNNAMED HINO MRN: [id number] DOB: May 24, 1956    ADMISSION DATE:  05/09/2017 CONSULTATION DATE: 05/17/2017  REFERRING MD:  Eulis Foster  CHIEF COMPLAINT:  AMS  HISTORY OF PRESENT ILLNESS:   DAJANAY NORTHRUP is a 61 y.o. female with PMH of cirrhosis, chronic hepatitis, portal hypertension, recently diagnosed Wilson's disease (mildly elevated ceruloplasmin, 24 hr urinary copper, liver biopsy copper via transjugular liver biopsy 05/07/17, NO Beatriz Chancellor rings on ophthalmology eval).  She is followed by Dr. Marius Ditch at [facility] and she is apparently being evaluated for liver transplant at [facility].  She presented to [facility] ED 11/30 with altered mental status.  She has apparently been confused for the past several days per her daughter-in-law.  Daughter-in-law and additional family had been trying to convince her to move in with family so that they could help her with daily activities and to ensure that she would take her medications as prescribed.  Patient has apparently not been good about taking her medicines as prescribed and misses several doses on a daily basis.  Due to this, she was supposed to move in with daughter-in-law on 11/30.  When daughter-in-law called her that morning, there was no answer despite several calls.  Daughter-in-law then went to patient's home and found her on the floor sitting next to the chair.  Patient had told her that she apparently had a fall, was able to tell that she did not hit her head or lose consciousness. She had not had any recent complaints and had reportedly been in her usual state of health besides for confusion (felt to be due to missing lactulose), and decreased PO intake.  In ED, she was found to be encephalopathic, hypotensive, hypothermic.  Potassium was greater than 7.5 (she received temporizing measures), ammonia 163, lactate 4.13 (repeat 4.82 > 1.91).  PCCM consulted for admission  Of note, she has had extensive  workup of her liver disease including normal iron & ferritin, Hepatitis total A Ab positive, Hepatitis B surface Ag negative, Hepatitis B surface Ab mildly positive,& Hepatitis C Ab negative, HIV negative, ANA negative, AMA & anti-SMA negative, LKM ab negative, Immunoglobulins normal, A-1 antitrypsin normal, celiac serology negative.  As mentioned above, she recently had mildly elevated ceruloplasmin, 24 hr urinary copper, liver biopsy copper via transjugular liver biopsy 05/07/17, NO Kaiser Fleischer rings on ophthalmology eval.   SUBJECTIVE:   Required BiPAP overnight, now on 100% NRB, no new complaints, asking for food and water   VITAL SIGNS: BP (!) 114/54   Pulse 80   Temp (!) 97.4 F (36.3 C) (Axillary)   Resp (!) 23   Ht 5\' 8"  (1.727 m)   Wt (!) 143 kg (315 lb 4.1 oz)   SpO2 96%   BMI 47.93 kg/m    HEMODYNAMICS:    VENTILATOR SETTINGS: FiO2 (%):  [50 %] 50 %  INTAKE / OUTPUT: I/O last 3 completed shifts: In: 4171.9 [I.V.:1621.9; Other:50; IV [id number]:5638] Out: 7564 [Urine:4825; Stool:510]  PHYSICAL EXAMINATION:  General: Acute on chronically appearing female, mild respiratory distress HEENT: [place]/AT, PERRL, EOM-I and MMM Neuro: Awake and interactive, moving all ext to command CV: RRR, Nl S1/S2, -M/R/G PULM: Diffuse crackles GI: Soft, NT, ND and +BS Extremities: 3+ edema Skin: thin skin, jaundice, multiple bruises   LABS:  BMET Recent Labs  Lab 06/10/17 2038 06/11/17 0547 06/12/17 0508  NA 142 142 143  K 2.8* 3.3* 2.5*  CL 110 108 104  CO2 25 27  28  BUN 18 19 23*  CREATININE 1.09* 1.13* 1.22*  GLUCOSE 190* 138* 151*   Electrolytes Recent Labs  Lab 06/10/17 0247 06/10/17 2038 06/11/17 0547 06/12/17 0508  CALCIUM 8.7* 8.3* 8.5* 8.5*  MG 1.9  --  1.6* 1.5*  PHOS 2.5  --  2.8 3.5   CBC Recent Labs  Lab 06/09/17 0500 06/11/17 0547 06/12/17 0508  WBC 11.6* 25.1* 14.1*  HGB 8.2* 8.8* 7.3*  HCT 23.9* 25.8* 21.0*  PLT 65* 52* 35*    Coag's Recent Labs  Lab 06/08/17 0426 06/10/17 0247  INR 2.36 2.51   Sepsis Markers Recent Labs  Lab 06/09/17 1508  06/09/17 2114 06/10/17 0247 06/10/17 0835 06/11/17 0547  LATICACIDVEN  --    < > 4.7* 3.6* 2.8*  --   PROCALCITON 0.10  --   --  0.14  --  0.21   < > = values in this interval not displayed.   ABG Recent Labs  Lab 06/09/17 0858  PHART 7.453*  PCO2ART 36.6  PO2ART 49.1*   Liver Enzymes Recent Labs  Lab 06/05/17 1609 06/08/17 0426 06/12/17 0508  AST 47* 41 37  ALT 25 28 21   ALKPHOS 131* 95 70  BILITOT 8.2* 10.5* 6.9*  ALBUMIN 2.3* 2.6* 2.7*   Cardiac Enzymes No results for input(s): TROPONINI, PROBNP in the last 168 hours.  Glucose Recent Labs  Lab 06/11/17 1242 06/11/17 1550 06/11/17 2005 06/12/17 0007 06/12/17 0355 06/12/17 0831  GLUCAP 164* 145* 121* 147* 155* 175*   Imaging Ct Chest Wo Contrast  Result Date: 06/12/2017 CLINICAL DATA:  Increased SOBHX HTN, CHF, cirrhosis EXAM: CT CHEST WITHOUT CONTRAST TECHNIQUE: Multidetector CT imaging of the chest was performed following the standard protocol without IV contrast. COMPARISON:  Chest radiograph, 06/11/2017.  Chest CT, 06/06/2017. FINDINGS: Cardiovascular: Heart is mildly enlarged. Mild three-vessel coronary artery calcifications. No pericardial effusion. Great vessels are normal in caliber. Minor aortic atherosclerotic calcifications along the arch. Mediastinum/Nodes: Prominent to mildly enlarged mediastinal lymph nodes. There is a prevascular node measuring 12 mm in short axis. Precarinal node measures 17 mm in short axis. No neck base or axillary masses or adenopathy. No mediastinal masses. No hilar masses or enlarged lymph nodes. Trachea is widely patent. Esophagus is unremarkable. Lungs/Pleura: Bilateral interstitial thickening. Ground-glass opacities are seen throughout both lungs with small intervening areas of relative sparing. Lung opacities have significantly worsened when compared  to the prior CT. No lung mass or discrete nodule. No pleural effusion or pneumothorax. Upper Abdomen: Morphologic changes of the liver reflecting cirrhosis. There is splenomegaly, large left upper abdomen varices and a small amount of ascites, findings reflecting the known cirrhosis, similar to the prior CT. No acute findings. Musculoskeletal: No fracture or acute finding. No osteoblastic or osteolytic lesions. IMPRESSION: 1. Significant worsening in lung aeration when compared to the CT dated 06/06/2017. There are bilateral, extensive ground-glass airspace opacities associated with interstitial thickening. Suspect extensive pneumonia, ARDS or a combination. A component of pulmonary edema is possible. There are associated prominent to mildly enlarged mediastinal lymph nodes which have increased in size when compared the prior CT. 2. Coronary artery calcifications. 3. Mild aortic atherosclerosis. Aortic Atherosclerosis (ICD10-I70.0). Electronically Signed   By: Lajean Manes M.D.   On: 06/12/2017 07:46   Dg Chest Port 1 View  Result Date: 06/12/2017 CLINICAL DATA:  Pulmonary edema. EXAM: PORTABLE CHEST 1 VIEW COMPARISON:  Current chest CT.  Chest radiograph, 06/11/2017. FINDINGS: There are bilateral interstitial and airspace opacities, without significant change  from the previous day's study when allowing for differences in patient positioning and radiographic technique. No new lung abnormalities. No pneumothorax. Right internal jugular central venous line is stable. IMPRESSION: 1. No convincing change from the previous day's study. Persistent bilateral interstitial and airspace lung opacities consistent with extensive pneumonia, pulmonary edema/ARDS, or a combination. Electronically Signed   By: Lajean Manes M.D.   On: 06/12/2017 09:03   STUDIES:  CXR 11/30 > chronic interstitial disease. CXR 12/1> ETT in place, No acute changes Ultrasound abdomen 12/1> cirrhotic liver, minimal ascites Echocardiogram  12/1> mild LVH, LVEF 65-70%. CT chest abd/pelv 12/2 > possible multifocal PNA vs asymmetric pulmonary edema CT Chest 12/7 >>   CULTURES:  Blood 11/30 >> negative  Urine 11/30 >> negative  ANTIBIOTICS: Vanc 11/30 >> 12/7 Zosyn 11/30 >> 12/5  Fortaz 12/5 >> 12/8  SIGNIFICANT EVENTS: 11/30  Admit 12/05  Tx to ICU for AFwRVR, increased WOB   12/07  Increased O2 needs, CXR with worsening edema  LINES/TUBES: ETT 12/1 > 12/3 CVL Rt IJ 12/1> Radial a line 11/30 > 12/1  DISCUSSION: 62 y.o. female with PMH including NASH cirrhosis, chronic hepatitis, portal hypertension,Wilson's disease. She is followed by Dr. Marius Ditch at [facility] and she is apparently being evaluated for liver transplant at [facility].  She presented [facility] 11/30 with acute encephalopathy and concern for sepsis of unclear etiology.  She was subsequently admitted to the ICU for further eval and management.  Transferred to the floor on 06/08/2017 and during the early a.m. of 06/09/2017 developed atrial fibrillation rapid ventricular response along with hypotension.  She was placed on diltiazem drip with some improvement in her heart rate.  She is dyspneic with any exertion.  Stat portable chest x ray revealed frank pulmonary edema.  Due to her multiple medical problems, morbid obesity, pulmonary edema with recent extubation on 06/07/2017, she will be returned to the intensive care for further evaluation and treatment.  She may need noninvasive mechanical ventilatory support and/or intubation.  ASSESSMENT / PLAN:  PULMONARY A: Acute resp failure - s/p intubation, extubated 12/3 and tolerated well Acute Respiratory Distress - 12/4, in setting of AFwRVR P:   O2 to support sats > 90% BiPAP for respiratory support Confirmed with family / patient that she would want reintubation short term only Pulmonary hygiene - IS, mobilize  Follow CXR  Repeat CT chest w/o  See ID  CARDIOVASCULAR A:  Sepsis - of unclear etiology,  possible peritonitis in setting ascites / cirrhosis.  A repeat sepsis assessment has been performed. Troponin bump - due to above. Hx HTN, HLD, chronic dCHF (echo from Feb 2018 with EF 30-86%, LV diastolic dysfunction, mild MR, mod LA dilation). P:  ICU monitoring  Continue ASA Lasix gtt at 10 mg/hr  Continue IV lopressor  Cardiology consult with intermittent AF despite cardizem  Hold home atenolol, spironolactone  RENAL A:   Hyponatremia - presumed due to hypovolemia, cirrhosis.  Resolved. Hypernatremia  Hyperkalemia - s/p temporizing measures in ED.   Resolved. NAGMA - resolved. AKI - improving. Hypo-magnesium P:   KVO IVF  Lasix 10 mg/hr Metalozone 10 mg x1  Consider restart of spironolactone in am 12/8 (monitor BP effect) Albumin 25g Q6 x4 doses  Trend BMP / urinary output Diamox 250 mg IV q6 x3 doses Replace electrolytes as indicated Avoid nephrotoxic agents, ensure adequate renal perfusion  GASTROINTESTINAL A:   Hx NASH cirrhosis, chronic hepatitis, recent dx Wilson's disease (followed by Dr. Marius Ditch at [facility]) -  reportedly being worked up for transplant at [facility]. Hyperbilirubinemia - chronic, due to above. No ascites noted on abd ultrasound Dysphagia - failed swallow eval 12/4 P:   GI following appreciate input Continue protonix Continue lactulose, rifaximin  SLP following > D2 diet  HEMATOLOGIC A:   Anemia - chronic, s/p 1u PRBC transfusion 12/2 Thrombocytopenia - chronic. VTE Prophylaxis. P:  Follow CBC  Monitor for bleeding SCD's for DVT prophylaxis  Repeat INR in am   INFECTIOUS A:   Sepsis - of unclear etiology, possible peritonitis in setting ascites / cirrhosis.  Ultrasound with minimal peritoneal fluid making peritonitis unlikely.  Possible multifocal PNA based on CT chest.    P:   D/C all abx Monitor clinically  ENDOCRINE A: No acute issues. P: Monitor glucose on BMP   NEUROLOGIC A:   Acute encephalopathy - presumed multifactorial in  setting hepatic.  Soft encephalopathy + sepsis. Hx headache, anxiety. P:   Continue lacutlose, rifaximin Avoid sedating medications PT efforts Continue citalopram  Hold home hydroxyzine, Oxy IR, trazodone (PRN), cyclobenzaprine, PRN xanax Monitor for benzo withdrawal  Family updated:  LCB with short term intubation and pressors only, no CPR, cardioversion, trach or peg  The patient is critically ill with multiple organ systems failure and requires high complexity decision making for assessment and support, frequent evaluation and titration of therapies, application of advanced monitoring technologies and extensive interpretation of multiple databases.   Critical Care Time devoted to patient care services described in this note is  35  Minutes. This time reflects time of care of this signee Dr Jennet Maduro. This critical care time does not reflect procedure time, or teaching time or supervisory time of PA/NP/Med student/Med Resident etc but could involve care discussion time.  Rush Farmer, M.D. [facility] Pulmonary/Critical Care Medicine. Pager: [phone]. After hours pager: [phone].

## 2017-06-12 NOTE — Progress Notes (Signed)
eLink Physician-Brief Progress Note Patient Name: Leah Shah DOB: Jul 09, 1955 MRN: [id number]   Date of Service  06/12/2017  HPI/Events of Note  Hypokalemia and hypomag  eICU Interventions  Potassium and mag replaced     Intervention Category Intermediate Interventions: Electrolyte abnormality - evaluation and management  [name] 06/12/2017, 6:14 AM

## 2017-06-12 NOTE — Progress Notes (Deleted)
Hypoglycemic Event  CBG: 59  Treatment: Dextroject 25 ml Symptoms: None  Follow-up CBG: Time:Not performed yet CBG Result: not performed yet Possible Reasons for Event: Inadequate meal intake  Comments/MD notified:Dr. Nelda Marseille notified, orders recvd.   Gates Rigg

## 2017-06-12 NOTE — Procedures (Signed)
OGT Placement By MD  Patient started vomiting during intubation.  OGT inserted in order to empty the stomach prior to intubation.  Inserted under direct laryngoscopy and confirmed by auscultation.  Rush Farmer, M.D. [facility] Pulmonary/Critical Care Medicine. Pager: [phone]. After hours pager: [phone].

## 2017-06-12 NOTE — Progress Notes (Signed)
eLink Physician-Brief Progress Note Patient Name: Leah Shah DOB: [birth year] MRN: [id number]   Date of Service  06/12/2017  HPI/Events of Note  Hypokalemia  eICU Interventions  Potassium replaced     Intervention Category Intermediate Interventions: Electrolyte abnormality - evaluation and management  DETERDING,ELIZABETH 06/12/2017, 10:35 PM

## 2017-06-12 NOTE — Procedures (Signed)
Intubation Procedure Note ISHI DANSER [id number] [birth year]  Procedure: Intubation Indications: Respiratory insufficiency  Procedure Details Consent: Risks of procedure as well as the alternatives and risks of each were explained to the (patient/caregiver).  Consent for procedure obtained. Time Out: Verified patient identification, verified procedure, site/side was marked, verified correct patient position, special equipment/implants available, medications/allergies/relevent history reviewed, required imaging and test results available.  Performed  Maximum sterile technique was used including gloves, hand hygiene and mask.  MAC    Evaluation Hemodynamic Status: BP stable throughout; O2 sats: stable throughout Patient's Current Condition: stable Complications: No apparent complications Patient did tolerate procedure well. Chest X-ray ordered to verify placement.  CXR: pending.   Jennet Maduro 06/12/2017

## 2017-06-12 NOTE — Progress Notes (Signed)
Patient transported from 2M10 to CT and back without any complications

## 2017-06-12 NOTE — Progress Notes (Signed)
Pt taken off bipap for meds and placed on 100% NRB mask.  Pt is currently tolerating well with no desaturations.  Sats 95%, RT will wean O2 as tolerated.

## 2017-06-12 NOTE — Progress Notes (Signed)
CRITICAL VALUE ALERT  Critical Value:  Potassium 2.5   Date & Time Notied:  06/12/2017  6:11 AM  Provider Notified: Dr. Jimmy Footman  Orders Received/Actions taken: Orders placed to replace potassium and magnesium.  Milford Cage, RN

## 2017-06-13 ENCOUNTER — Inpatient Hospital Stay (HOSPITAL_COMMUNITY): Payer: Medicare HMO

## 2017-06-13 LAB — GLUCOSE, CAPILLARY
Glucose-Capillary: 109 mg/dL — ABNORMAL HIGH (ref 65–99)
Glucose-Capillary: 112 mg/dL — ABNORMAL HIGH (ref 65–99)
Glucose-Capillary: 135 mg/dL — ABNORMAL HIGH (ref 65–99)
Glucose-Capillary: 88 mg/dL (ref 65–99)
Glucose-Capillary: 88 mg/dL (ref 65–99)
Glucose-Capillary: 91 mg/dL (ref 65–99)

## 2017-06-13 LAB — BASIC METABOLIC PANEL
ANION GAP: 10 (ref 5–15)
Anion gap: 15 (ref 5–15)
BUN: 33 mg/dL — AB (ref 6–20)
BUN: 38 mg/dL — AB (ref 6–20)
CHLORIDE: 102 mmol/L (ref 101–111)
CO2: 29 mmol/L (ref 22–32)
CO2: 25 mmol/L (ref 22–32)
CREATININE: 2.04 mg/dL — AB (ref 0.44–1.00)
Calcium: 8.7 mg/dL — ABNORMAL LOW (ref 8.9–10.3)
Calcium: 8.3 mg/dL — ABNORMAL LOW (ref 8.9–10.3)
Chloride: 106 mmol/L (ref 101–111)
Creatinine, Ser: 1.45 mg/dL — ABNORMAL HIGH (ref 0.44–1.00)
GFR calc non Af Amer: 25 mL/min — ABNORMAL LOW (ref >60)
GFR, EST AFRICAN AMERICAN: 44 mL/min — AB (ref >60)
GFR, EST AFRICAN AMERICAN: 29 mL/min — AB (ref >60)
GFR, EST NON AFRICAN AMERICAN: 38 mL/min — AB (ref >60)
GLUCOSE: 143 mg/dL — AB (ref 65–99)
Glucose, Bld: 111 mg/dL — ABNORMAL HIGH (ref 65–99)
POTASSIUM: 2.5 mmol/L — AB (ref 3.5–5.1)
Potassium: 2.9 mmol/L — ABNORMAL LOW (ref 3.5–5.1)
SODIUM: 145 mmol/L (ref 135–145)
Sodium: 142 mmol/L (ref 135–145)

## 2017-06-13 LAB — PHOSPHORUS: PHOSPHORUS: 4.7 mg/dL — AB (ref 2.5–4.6)

## 2017-06-13 LAB — MAGNESIUM
MAGNESIUM: 1.9 mg/dL (ref 1.7–2.4)
Magnesium: 2.2 mg/dL (ref 1.7–2.4)

## 2017-06-13 MED ORDER — AMIODARONE LOAD VIA INFUSION
150.0000 mg | Freq: Once | INTRAVENOUS | Status: AC
  Administered 2017-06-13: 150 mg via INTRAVENOUS
  Filled 2017-06-13: qty 83.34

## 2017-06-13 MED ORDER — MAGNESIUM SULFATE 2 GM/50ML IV SOLN
2.0000 g | Freq: Once | INTRAVENOUS | Status: AC
  Administered 2017-06-13: 2 g via INTRAVENOUS
  Filled 2017-06-13: qty 50

## 2017-06-13 MED ORDER — AMIODARONE HCL IN DEXTROSE 360-4.14 MG/200ML-% IV SOLN
30.0000 mg/h | INTRAVENOUS | Status: DC
  Administered 2017-06-14 (×2): 30 mg/h via INTRAVENOUS
  Filled 2017-06-13 (×3): qty 200

## 2017-06-13 MED ORDER — NOREPINEPHRINE BITARTRATE 1 MG/ML IV SOLN
0.0000 ug/min | INTRAVENOUS | Status: DC
  Administered 2017-06-13: 2 ug/min via INTRAVENOUS
  Administered 2017-06-14 (×2): 15 ug/min via INTRAVENOUS
  Filled 2017-06-13 (×5): qty 4

## 2017-06-13 MED ORDER — METOLAZONE 10 MG PO TABS
10.0000 mg | ORAL_TABLET | Freq: Once | ORAL | Status: AC
  Administered 2017-06-13: 10 mg via ORAL
  Filled 2017-06-13: qty 1

## 2017-06-13 MED ORDER — AMIODARONE HCL IN DEXTROSE 360-4.14 MG/200ML-% IV SOLN
60.0000 mg/h | INTRAVENOUS | Status: DC
  Administered 2017-06-13 – 2017-06-14 (×2): 60 mg/h via INTRAVENOUS
  Filled 2017-06-13: qty 200

## 2017-06-13 MED ORDER — FUROSEMIDE 10 MG/ML IJ SOLN: 10.0000 mg/h | INTRAVENOUS | Status: DC

## 2017-06-13 MED ORDER — ACETAZOLAMIDE SODIUM 500 MG IJ SOLR
250.0000 mg | Freq: Four times a day (QID) | INTRAMUSCULAR | Status: DC
  Administered 2017-06-13 (×2): 250 mg via INTRAVENOUS
  Filled 2017-06-13 (×3): qty 250

## 2017-06-13 MED ORDER — POTASSIUM CHLORIDE 20 MEQ/15ML (10%) PO SOLN
40.0000 meq | Freq: Three times a day (TID) | ORAL | Status: AC
  Administered 2017-06-13 (×2): 40 meq
  Filled 2017-06-13 (×2): qty 30

## 2017-06-13 MED ORDER — AMIODARONE HCL IN DEXTROSE 360-4.14 MG/200ML-% IV SOLN
INTRAVENOUS | Status: AC
  Administered 2017-06-13: 150 mg via INTRAVENOUS
  Filled 2017-06-13: qty 200

## 2017-06-13 NOTE — Progress Notes (Signed)
After ordered and given diuretics pt's SBP started decline and sustained in 70-80s. Dr. Nelda Marseille called and notified. Levo orders are in.

## 2017-06-13 NOTE — Progress Notes (Signed)
CRITICAL VALUE ALERT  Critical Value: Potassium  2.5, Platelets 23  Date & Time Notied:  06/13/17 1140 and 1215   Provider Notified: Dr. Nelda Marseille and Beaulah Corin  Orders Received/Actions taken: waiting on orders

## 2017-06-13 NOTE — Progress Notes (Signed)
eLink Physician-Brief Progress Note Patient Name: Leah Shah DOB: 1955/10/09 MRN: [id number]   Date of Service  06/13/2017  HPI/Events of Note  Hypothermia - Temp = 52 F.   eICU Interventions  Will order [employer].     Intervention Category Major Interventions: Other:  Lysle Dingwall 06/13/2017, 8:29 PM

## 2017-06-13 NOTE — Progress Notes (Signed)
PCCM Interval Note  Called to patient bedside due to concern for increasing [place] emphysema. Upon assessment [place] air appears to have decreased in size since assessment earlier this AM. CXR with pneumomediastinum, again that was present on earlier CXR and CT Chest.   Patient also with progressive hypotension on 15 mcg/hr levophed and HR 150-160 being given Ammio bolus. Spoke with patient son and daughter in low who understand severity of current medical condition and agree to no further escalation of care. Will max levophed out at 15 mcg and continue current care however will not escalate care further. Orders updated in EMR and E-Link notified of goals of care.   Leah Shah, AGACNP-BC [place] [facility]  Pgr: [phone]  PCCM Pgr: [phone]

## 2017-06-13 NOTE — Progress Notes (Signed)
PCCM Interval Note   Patient with extensive SQ air on chest wall. CXR shows Trace potential pneumomediastinum. PEEP decreased from 14 to 8 with repeat ABG ordered for 0400. CT Chest ordered for further evaluation.   Leah Shah, AGACNP-BC [place] [facility]  Pgr: [phone]  PCCM Pgr: [phone]

## 2017-06-13 NOTE — Progress Notes (Signed)
eLink Physician-Brief Progress Note Patient Name: Leah Shah DOB: 1956-01-20 MRN: [id number]   Date of Service  06/13/2017  HPI/Events of Note  Multiple concerns: 1. Increasing [place] emphysema and 2. AFIB with RVR - Ventricular rate to 130 by report. HR now = 1103-145.   eICU Interventions  Will order:  1. Portable CXR STAT. 2. BMP and Mg++ level STAT. 3. Amiodarone IV load and infusion.  4. Will ask ground team to evaluate at bedside.      Intervention Category Major Interventions: Arrhythmia - evaluation and management;Other:  Lysle Dingwall 06/13/2017, 9:31 PM

## 2017-06-13 NOTE — Progress Notes (Signed)
PULMONARY / CRITICAL CARE MEDICINE   Name: Leah Shah MRN: [id number] DOB: February 19, 1956    ADMISSION DATE:  06/02/2017 CONSULTATION DATE: 06/01/2017  REFERRING MD:  Eulis Foster  CHIEF COMPLAINT:  AMS  HISTORY OF PRESENT ILLNESS:   Leah Shah is a 61 y.o. female with PMH of cirrhosis, chronic hepatitis, portal hypertension, recently diagnosed Wilson's disease (mildly elevated ceruloplasmin, 24 hr urinary copper, liver biopsy copper via transjugular liver biopsy 05/07/17, NO Beatriz Chancellor rings on ophthalmology eval).  She is followed by Dr. Marius Ditch at [facility] and she is apparently being evaluated for liver transplant at [facility].  She presented to [facility] ED 11/30 with altered mental status.  She has apparently been confused for the past several days per her daughter-in-law.  Daughter-in-law and additional family had been trying to convince her to move in with family so that they could help her with daily activities and to ensure that she would take her medications as prescribed.  Patient has apparently not been good about taking her medicines as prescribed and misses several doses on a daily basis.  Due to this, she was supposed to move in with daughter-in-law on 11/30.  When daughter-in-law called her that morning, there was no answer despite several calls.  Daughter-in-law then went to patient's home and found her on the floor sitting next to the chair.  Patient had told her that she apparently had a fall, was able to tell that she did not hit her head or lose consciousness. She had not had any recent complaints and had reportedly been in her usual state of health besides for confusion (felt to be due to missing lactulose), and decreased PO intake.  In ED, she was found to be encephalopathic, hypotensive, hypothermic.  Potassium was greater than 7.5 (she received temporizing measures), ammonia 163, lactate 4.13 (repeat 4.82 > 1.91).  PCCM consulted for admission  Of note, she has had extensive  workup of her liver disease including normal iron & ferritin, Hepatitis total A Ab positive, Hepatitis B surface Ag negative, Hepatitis B surface Ab mildly positive,& Hepatitis C Ab negative, HIV negative, ANA negative, AMA & anti-SMA negative, LKM ab negative, Immunoglobulins normal, A-1 antitrypsin normal, celiac serology negative.  As mentioned above, she recently had mildly elevated ceruloplasmin, 24 hr urinary copper, liver biopsy copper via transjugular liver biopsy 05/07/17, NO Kaiser Fleischer rings on ophthalmology eval.   SUBJECTIVE:   Intubated yesterday, pneumomediastinum overnight  VITAL SIGNS: BP (!) 88/53   Pulse (!) 119   Temp (!) 97.5 F (36.4 C) (Oral) Comment: Maria, RN notified  Resp (!) 22   Ht 5\' 8"  (1.727 m)   Wt (!) 136.5 kg (300 lb 14.9 oz)   SpO2 100%   BMI 45.76 kg/m   HEMODYNAMICS:    VENTILATOR SETTINGS: Vent Mode: PRVC FiO2 (%):  [100 %] 100 % Set Rate:  [16 bmp-32 bmp] 32 bmp Vt Set:  [550 mL] 550 mL PEEP:  [8 cmH20-14 cmH20] 8 cmH20 Plateau Pressure:  [15 cmH20-36 cmH20] 29 cmH20  INTAKE / OUTPUT: I/O last 3 completed shifts: In: 2955.3 [P.O.:30; I.V.:995.3; IV Piggyback:1930] Out: 4098 [Urine:5340; Stool:360]  PHYSICAL EXAMINATION:  General: Acute on chronically appearing female, mild respiratory distress HEENT: [place]/AT, PERRL, EOM-I and DMM Neuro: Sedate, not following commands CV: RRR, Nl S1/S2, -M/R/G PULM: Diffuse crackles and chest wall with SQ air GI: Soft, NT, ND and +BS Extremities: 2+ edema but improving Skin: thin skin, jaundice, multiple bruises   LABS:  BMET Recent  Labs  Lab 06/12/17 0508 06/12/17 2059 06/13/17 0500  NA 143 142 145  K 2.5* 2.5* 2.5*  CL 104 104 106  CO2 28 28 29   BUN 23* 28* 33*  CREATININE 1.22* 1.34* 1.45*  GLUCOSE 151* 183* 111*   Electrolytes Recent Labs  Lab 06/11/17 0547 06/12/17 0508 06/12/17 2059 06/13/17 0500  CALCIUM 8.5* 8.5* 8.3* 8.7*  MG 1.6* 1.5* 2.0 1.9  PHOS 2.8 3.5  --   4.7*   CBC Recent Labs  Lab 06/11/17 0547 06/12/17 0508 06/13/17 0500  WBC 25.1* 14.1* 11.9*  HGB 8.8* 7.3* 7.2*  HCT 25.8* 21.0* 21.2*  PLT 52* 35* 23*   Coag's Recent Labs  Lab 06/08/17 0426 06/10/17 0247 06/12/17 1648  INR 2.36 2.51 2.75   Sepsis Markers Recent Labs  Lab 06/09/17 1508  06/09/17 2114 06/10/17 0247 06/10/17 0835 06/11/17 0547  LATICACIDVEN  --    < > 4.7* 3.6* 2.8*  --   PROCALCITON 0.10  --   --  0.14  --  0.21   < > = values in this interval not displayed.   ABG Recent Labs  Lab 06/12/17 1700 06/12/17 1750 06/12/17 2008  PHART 7.182* 7.219* 7.316*  PCO2ART 80.9* 73.5* 55.7*  PO2ART 66.0* 86.6 140.0*   Liver Enzymes Recent Labs  Lab 06/08/17 0426 06/12/17 0508  AST 41 37  ALT 28 21  ALKPHOS 95 70  BILITOT 10.5* 6.9*  ALBUMIN 2.6* 2.7*   Cardiac Enzymes No results for input(s): TROPONINI, PROBNP in the last 168 hours.  Glucose Recent Labs  Lab 06/12/17 1615 06/12/17 2012 06/12/17 2351 06/13/17 0417 06/13/17 0813 06/13/17 1129  GLUCAP 166* 203* 167* 135* 109* 91   Imaging Dg Abd 1 View  Result Date: 06/12/2017 CLINICAL DATA:  Orogastric tube placement EXAM: ABDOMEN - 1 VIEW COMPARISON:  None. FINDINGS: Orogastric tube tip is in the proximal stomach with the side port at the gastroesophageal junction. There are loops of dilated colon. No small bowel dilatation evident. There is fibrotic change in lung bases. IMPRESSION: Orogastric tube tip in proximal stomach with side port gastroesophageal junction. Advise advancing orogastric tube 8 - 10 cm to insure that side-port and tube tip are well within the stomach. Loops of mildly dilated colon noted. No small bowel dilatation evident. No free air appreciable. Electronically Signed   By: Lowella Grip III M.D.   On: 06/12/2017 16:41   Ct Chest Wo Contrast  Result Date: 06/13/2017 CLINICAL DATA:  61 y/o  F; acute respiratory illness. EXAM: CT CHEST WITHOUT CONTRAST TECHNIQUE:  Multidetector CT imaging of the chest was performed following the standard protocol without IV contrast. COMPARISON:  06/13/2017 chest radiograph.  06/12/2017 CT chest. FINDINGS: Cardiovascular: Normal caliber thoracic aorta and main pulmonary artery. Mild coronary artery calcifications. Normal heart size. No pericardial effusion. Mediastinum/Nodes: Small volume pneumomediastinum. Enteric tube tip below the field of view into the abdomen. Thyroid gland, trachea, and thoracic esophagus are normal. Endotracheal tube is 5 cm from the carina. Stable mediastinal lymphadenopathy, for example a right lower paratracheal lymph node measures 17 mm short axis, previously 17 mm (series 3, image 59). Lungs/Pleura: Interval increase in diffuse ground-glass and consolidative opacities throughout the lungs best appreciated at the lung bases with focal lobular areas of sparing. Small bilateral pleural effusions. No pneumothorax. Upper Abdomen: Enlarged left lobe of liver with mild lobulation compatible with cirrhosis. Splenomegaly partially visualized. Small stable volume of perihepatic and perisplenic ascites. Musculoskeletal: Extensive subcutaneous emphysema throughout the anterior chest  wall. IMPRESSION: 1. Mild interval worsening of diffuse ground-glass and consolidative opacities throughout the lungs, best appreciated in lung bases. Findings may be related to ARDS, acute interstitial pneumonitis, and/or pulmonary edema. 2. Small volume of pneumomediastinum. Diffuse subcutaneous emphysema throughout anterior chest wall. No pneumothorax. 3. Cirrhotic liver and splenomegaly. Stable small volume of perihepatic and perisplenic ascites in upper abdomen. 4. Stable mediastinal lymphadenopathy, likely reactive. Electronically Signed   By: Kristine Garbe M.D.   On: 06/13/2017 03:45   Dg Chest Port 1 View  Result Date: 06/13/2017 CLINICAL DATA:  Endotracheal tube placement. EXAM: PORTABLE CHEST 1 VIEW COMPARISON:  Chest  radiograph June 12, 2017 FINDINGS: Endotracheal tube tip projects 4.6 cm above the carina. Nasogastric tube past GE junction, distal tip not imaged. RIGHT internal jugular central venous catheter distal tip projects at brachiocephalic confluence, unchanged. New diffuse subcutaneous gas predominately on the RIGHT. Trace potential pneumomediastinum. Stable cardiomegaly. Calcified aortic knob. Diffuse interstitial and alveolar airspace opacities with small layering pleural effusions. No identified pneumothorax. IMPRESSION: New extensive subcutaneous emphysema, no pneumothorax identified. Trace potential pneumomediastinum. No apparent change in life-support lines. Diffuse interstitial and alveolar airspace opacities are similar. Small pleural effusions. Stable cardiomegaly. These results will be called to the ordering clinician or representative by the Radiologist Assistant, and communication documented in the PACS or zVision Dashboard. Electronically Signed   By: Elon Alas M.D.   On: 06/13/2017 01:39   Dg Chest Port 1 View  Result Date: 06/12/2017 CLINICAL DATA:  61 year old female status post intubation. EXAM: PORTABLE CHEST 1 VIEW COMPARISON:  06/12/2017 FINDINGS: There has been interval intubation, with the endotracheal tube approximately 4 cm above the level of the carina. A right IJ central venous catheter appears in stable position. Cardiomediastinal silhouette is somewhat obscured but grossly unchanged in size and configuration. There are continued bilateral interstitial and airspace opacities, left slightly greater than right. This appears minimally progressed from prior comparison examination. No pneumothorax. No acute osseous abnormalities. IMPRESSION: 1. Interval intubation with the endotracheal tube approximately 4 cm above the level of the carina. 2. Slight interval progression of bilateral interstitial and airspace opacities, left greater than right. Electronically Signed   By: Kristopher Oppenheim M.D.   On: 06/12/2017 16:36   STUDIES:  CXR 11/30 > chronic interstitial disease. CXR 12/1> ETT in place, No acute changes Ultrasound abdomen 12/1> cirrhotic liver, minimal ascites Echocardiogram 12/1> mild LVH, LVEF 65-70%. CT chest abd/pelv 12/2 > possible multifocal PNA vs asymmetric pulmonary edema CT Chest 12/7 >>   CULTURES:  Blood 11/30 >> negative  Urine 11/30 >> negative  ANTIBIOTICS: Vanc 11/30 >> 12/7 Zosyn 11/30 >> 12/5  Fortaz 12/5 >> 12/8  SIGNIFICANT EVENTS: 11/30  Admit 12/05  Tx to ICU for AFwRVR, increased WOB   12/07  Increased O2 needs, CXR with worsening edema  LINES/TUBES: ETT 12/1 > 12/3>>>12/8>>> CVL Rt IJ 12/1> Radial a line 11/30 > 12/1  DISCUSSION: 61 y.o. female with PMH including NASH cirrhosis, chronic hepatitis, portal hypertension,Wilson's disease. She is followed by Dr. Marius Ditch at [facility] and she is apparently being evaluated for liver transplant at [facility].  She presented [facility] 11/30 with acute encephalopathy and concern for sepsis of unclear etiology.  She was subsequently admitted to the ICU for further eval and management.  Transferred to the floor on 06/08/2017 and during the early a.m. of 06/09/2017 developed atrial fibrillation rapid ventricular response along with hypotension.  She was placed on diltiazem drip with some improvement in her heart rate.  She is dyspneic with any exertion.  Stat portable chest x ray revealed frank pulmonary edema.  Due to her multiple medical problems, morbid obesity, pulmonary edema with recent extubation on 06/07/2017, she will be returned to the intensive care for further evaluation and treatment.  She may need noninvasive mechanical ventilatory support and/or intubation.  ASSESSMENT / PLAN:  PULMONARY A: Acute resp failure - s/p intubation, extubated 12/3 and tolerated well Acute Respiratory Distress - 12/4, in setting of AFwRVR Pneumomediastinum and SQ air P:   O2 to support sats >  90% Continue full vent support but PEEP is not to exceed 8 Confirmed with family / patient that she would want reintubation short term only Pulmonary hygiene - IS, mobilize  Follow CXR  Repeat CT chest w/o  See ID  CARDIOVASCULAR A:  Sepsis - of unclear etiology, possible peritonitis in setting ascites / cirrhosis.  A repeat sepsis assessment has been performed. Troponin bump - due to above. Hx HTN, HLD, chronic dCHF (echo from Feb 2018 with EF 10-17%, LV diastolic dysfunction, mild MR, mod LA dilation). P:  ICU monitoring  Continue ASA Renew Lasix gtt at 10 mg/hr  Continue IV lopressor  Cardiology consult with intermittent AF despite cardizem  Hold home atenolol, spironolactone  RENAL A:   Hyponatremia - presumed due to hypovolemia, cirrhosis.  Resolved. Hypernatremia  Hyperkalemia - s/p temporizing measures in ED.   Resolved. NAGMA - resolved. AKI - improving. Hypo-magnesium P:   KVO IVF  Lasix 10 mg/hr Metalozone 10 mg x1  Consider restart of spironolactone in am 12/8 (monitor BP effect) Albumin 25g Q6 x4 doses  Trend BMP / urinary output Diamox 250 mg IV q6 x3 doses Replace electrolytes as indicated Avoid nephrotoxic agents, ensure adequate renal perfusion  GASTROINTESTINAL A:   Hx NASH cirrhosis, chronic hepatitis, recent dx Wilson's disease (followed by Dr. Marius Ditch at [facility]) - reportedly being worked up for transplant at [facility]. Hyperbilirubinemia - chronic, due to above. No ascites noted on abd ultrasound Dysphagia - failed swallow eval 12/4 P:   GI following appreciate input Continue protonix Continue lactulose, rifaximin  SLP following > D2 diet  HEMATOLOGIC A:   Anemia - chronic, s/p 1u PRBC transfusion 12/2 Thrombocytopenia - chronic. VTE Prophylaxis. P:  Follow CBC  Monitor for bleeding SCD's for DVT prophylaxis  Repeat INR in am   INFECTIOUS A:   Sepsis - of unclear etiology, possible peritonitis in setting ascites / cirrhosis.  Ultrasound  with minimal peritoneal fluid making peritonitis unlikely.  Possible multifocal PNA based on CT chest.    P:   D/C all abx Monitor clinically  ENDOCRINE A: No acute issues. P: Monitor glucose on BMP   NEUROLOGIC A:   Acute encephalopathy - presumed multifactorial in setting hepatic.  Soft encephalopathy + sepsis. Hx headache, anxiety. P:   Continue lacutlose, rifaximin Continue citalopram  Hold home hydroxyzine, Oxy IR, trazodone (PRN), cyclobenzaprine, PRN xanax Sedation ordered  Family updated:  LCB with short term intubation and pressors only, no CPR, cardioversion, trach or peg  The patient is critically ill with multiple organ systems failure and requires high complexity decision making for assessment and support, frequent evaluation and titration of therapies, application of advanced monitoring technologies and extensive interpretation of multiple databases.   Critical Care Time devoted to patient care services described in this note is  35  Minutes. This time reflects time of care of this signee Dr Jennet Maduro. This critical care time does not reflect procedure time,  or teaching time or supervisory time of PA/NP/Med student/Med Resident etc but could involve care discussion time.  Rush Farmer, M.D. [facility] Pulmonary/Critical Care Medicine. Pager: [phone]. After hours pager: [phone].

## 2017-06-13 NOTE — Progress Notes (Signed)
PCCM Interval Note   Systolic remains 35-361, with HR 60-70. PO Cardizem D/C, Metoprolol changed to PRN for HR > 120   Leah Shah, AGACNP-BC [facility]  Pgr: [phone]  PCCM Pgr: [phone]

## 2017-06-13 NOTE — Progress Notes (Signed)
Progress Note  Patient Name: CHANDELL ATTRIDGE Date of Encounter: 06/13/2017  Primary Cardiologist:  [place], previously Dr. Yvone Neu  Subjective   Remains intubated and sedated  Inpatient Medications    Scheduled Meds: . aspirin  81 mg Per Tube Daily  . chlorhexidine  15 mL Mouth Rinse BID  . Chlorhexidine Gluconate Cloth  6 each Topical Daily  . citalopram  10 mg Oral Daily  . lactulose  20 g Oral BID  . mouth rinse  15 mL Mouth Rinse q12n4p  . pantoprazole (PROTONIX) IV  40 mg Intravenous Q24H  . rifaximin  550 mg Oral BID  . rocuronium  50 mg Intravenous Once  . sodium chloride flush  10-40 mL Intracatheter Q12H   Continuous Infusions: . sodium chloride 250 mL (06/13/17 0000)  . fentaNYL infusion INTRAVENOUS 250 mcg/hr (06/13/17 0842)  . furosemide (LASIX) infusion 10 mg/hr (06/12/17 2000)  . furosemide (LASIX) infusion     PRN Meds: sodium chloride, fentaNYL, metoprolol tartrate, midazolam, midazolam, RESOURCE THICKENUP CLEAR, sodium chloride flush, white petrolatum   Vital Signs    Vitals:   06/13/17 0418 06/13/17 0450 06/13/17 0500 06/13/17 0600  BP:  (!) 122/41 (!) 106/33 (!) 124/41  Pulse:  78 76 84  Resp:  16 20 19   Temp: (!) 97.5 F (36.4 C)     TempSrc: Oral     SpO2:  100% 100% 100%  Weight:   (!) 300 lb 14.9 oz (136.5 kg)   Height:        Intake/Output Summary (Last 24 hours) at 06/13/2017 1020 Last data filed at 06/13/2017 0600 Gross per 24 hour  Intake 1445 ml  Output 2840 ml  Net -1395 ml   Filed Weights   06/11/17 0500 06/12/17 0500 06/13/17 0500  Weight: (!) 313 lb 0.9 oz (142 kg) (!) 315 lb 4.1 oz (143 kg) (!) 300 lb 14.9 oz (136.5 kg)    Telemetry    Atrial fibrillation with a rapid ventricular response- Personally Reviewed  ECG    None- Personally Reviewed  Physical Exam   GEN:  Remains intubated and sedated Neck:  Unable to assess JVD Cardiac: IRIRR, no murmurs, rubs, or gallops.  Respiratory:  Scattered rales and  rhonchi. GI: Soft, nontender, non-distended  MS: No edema; No deformity. Neuro:  Intubated and sedated Skin : With multiple ecchymotic areas over the left chest and arm with crepitus    Labs    Chemistry Recent Labs  Lab 06/08/17 0426  06/11/17 0547 06/12/17 0508 06/12/17 2059  NA 142   < > 142 143 142  K 4.1   < > 3.3* 2.5* 2.5*  CL 109   < > 108 104 104  CO2 25   < > 27 28 28   GLUCOSE 144*   < > 138* 151* 183*  BUN 26*   < > 19 23* 28*  CREATININE 0.96   < > 1.13* 1.22* 1.34*  CALCIUM 8.7*   < > 8.5* 8.5* 8.3*  PROT 5.8*  --   --  5.1*  --   ALBUMIN 2.6*  --   --  2.7*  --   AST 41  --   --  37  --   ALT 28  --   --  21  --   ALKPHOS 95  --   --  70  --   BILITOT 10.5*  --   --  6.9*  --   GFRNONAA >60   < > 51*  46* 41*  GFRAA >60   < > 59* 54* 48*  ANIONGAP 8   < > 7 11 10    < > = values in this interval not displayed.     Hematology Recent Labs  Lab 06/09/17 0500 06/11/17 0547 06/12/17 0508  WBC 11.6* 25.1* 14.1*  RBC 2.09* 2.27* 1.85*  HGB 8.2* 8.8* 7.3*  HCT 23.9* 25.8* 21.0*  MCV 114.4* 113.7* 113.5*  MCH 39.2* 38.8* 39.5*  MCHC 34.3 34.1 34.8  RDW NOT CALCULATED NOT CALCULATED NOT CALCULATED  PLT 65* 52* 35*    Cardiac EnzymesNo results for input(s): TROPONINI in the last 168 hours. No results for input(s): TROPIPOC in the last 168 hours.   BNPNo results for input(s): BNP, PROBNP in the last 168 hours.   DDimer No results for input(s): DDIMER in the last 168 hours.   Radiology    Dg Abd 1 View  Result Date: 06/12/2017 CLINICAL DATA:  Orogastric tube placement EXAM: ABDOMEN - 1 VIEW COMPARISON:  None. FINDINGS: Orogastric tube tip is in the proximal stomach with the side port at the gastroesophageal junction. There are loops of dilated colon. No small bowel dilatation evident. There is fibrotic change in lung bases. IMPRESSION: Orogastric tube tip in proximal stomach with side port gastroesophageal junction. Advise advancing orogastric tube 8 - 10  cm to insure that side-port and tube tip are well within the stomach. Loops of mildly dilated colon noted. No small bowel dilatation evident. No free air appreciable. Electronically Signed   By: Lowella Grip III M.D.   On: 06/12/2017 16:41   Ct Chest Wo Contrast  Result Date: 06/13/2017 CLINICAL DATA:  61 y/o  F; acute respiratory illness. EXAM: CT CHEST WITHOUT CONTRAST TECHNIQUE: Multidetector CT imaging of the chest was performed following the standard protocol without IV contrast. COMPARISON:  06/13/2017 chest radiograph.  06/12/2017 CT chest. FINDINGS: Cardiovascular: Normal caliber thoracic aorta and main pulmonary artery. Mild coronary artery calcifications. Normal heart size. No pericardial effusion. Mediastinum/Nodes: Small volume pneumomediastinum. Enteric tube tip below the field of view into the abdomen. Thyroid gland, trachea, and thoracic esophagus are normal. Endotracheal tube is 5 cm from the carina. Stable mediastinal lymphadenopathy, for example a right lower paratracheal lymph node measures 17 mm short axis, previously 17 mm (series 3, image 59). Lungs/Pleura: Interval increase in diffuse ground-glass and consolidative opacities throughout the lungs best appreciated at the lung bases with focal lobular areas of sparing. Small bilateral pleural effusions. No pneumothorax. Upper Abdomen: Enlarged left lobe of liver with mild lobulation compatible with cirrhosis. Splenomegaly partially visualized. Small stable volume of perihepatic and perisplenic ascites. Musculoskeletal: Extensive subcutaneous emphysema throughout the anterior chest wall. IMPRESSION: 1. Mild interval worsening of diffuse ground-glass and consolidative opacities throughout the lungs, best appreciated in lung bases. Findings may be related to ARDS, acute interstitial pneumonitis, and/or pulmonary edema. 2. Small volume of pneumomediastinum. Diffuse subcutaneous emphysema throughout anterior chest wall. No pneumothorax. 3.  Cirrhotic liver and splenomegaly. Stable small volume of perihepatic and perisplenic ascites in upper abdomen. 4. Stable mediastinal lymphadenopathy, likely reactive. Electronically Signed   By: Kristine Garbe M.D.   On: 06/13/2017 03:45   Ct Chest Wo Contrast  Result Date: 06/12/2017 CLINICAL DATA:  Increased SOBHX HTN, CHF, cirrhosis EXAM: CT CHEST WITHOUT CONTRAST TECHNIQUE: Multidetector CT imaging of the chest was performed following the standard protocol without IV contrast. COMPARISON:  Chest radiograph, 06/11/2017.  Chest CT, 06/06/2017. FINDINGS: Cardiovascular: Heart is mildly enlarged. Mild three-vessel coronary artery calcifications.  No pericardial effusion. Great vessels are normal in caliber. Minor aortic atherosclerotic calcifications along the arch. Mediastinum/Nodes: Prominent to mildly enlarged mediastinal lymph nodes. There is a prevascular node measuring 12 mm in short axis. Precarinal node measures 17 mm in short axis. No neck base or axillary masses or adenopathy. No mediastinal masses. No hilar masses or enlarged lymph nodes. Trachea is widely patent. Esophagus is unremarkable. Lungs/Pleura: Bilateral interstitial thickening. Ground-glass opacities are seen throughout both lungs with small intervening areas of relative sparing. Lung opacities have significantly worsened when compared to the prior CT. No lung mass or discrete nodule. No pleural effusion or pneumothorax. Upper Abdomen: Morphologic changes of the liver reflecting cirrhosis. There is splenomegaly, large left upper abdomen varices and a small amount of ascites, findings reflecting the known cirrhosis, similar to the prior CT. No acute findings. Musculoskeletal: No fracture or acute finding. No osteoblastic or osteolytic lesions. IMPRESSION: 1. Significant worsening in lung aeration when compared to the CT dated 06/06/2017. There are bilateral, extensive ground-glass airspace opacities associated with interstitial  thickening. Suspect extensive pneumonia, ARDS or a combination. A component of pulmonary edema is possible. There are associated prominent to mildly enlarged mediastinal lymph nodes which have increased in size when compared the prior CT. 2. Coronary artery calcifications. 3. Mild aortic atherosclerosis. Aortic Atherosclerosis (ICD10-I70.0). Electronically Signed   By: Lajean Manes M.D.   On: 06/12/2017 07:46   Dg Chest Port 1 View  Result Date: 06/13/2017 CLINICAL DATA:  Endotracheal tube placement. EXAM: PORTABLE CHEST 1 VIEW COMPARISON:  Chest radiograph June 12, 2017 FINDINGS: Endotracheal tube tip projects 4.6 cm above the carina. Nasogastric tube past GE junction, distal tip not imaged. RIGHT internal jugular central venous catheter distal tip projects at brachiocephalic confluence, unchanged. New diffuse subcutaneous gas predominately on the RIGHT. Trace potential pneumomediastinum. Stable cardiomegaly. Calcified aortic knob. Diffuse interstitial and alveolar airspace opacities with small layering pleural effusions. No identified pneumothorax. IMPRESSION: New extensive subcutaneous emphysema, no pneumothorax identified. Trace potential pneumomediastinum. No apparent change in life-support lines. Diffuse interstitial and alveolar airspace opacities are similar. Small pleural effusions. Stable cardiomegaly. These results will be called to the ordering clinician or representative by the Radiologist Assistant, and communication documented in the PACS or zVision Dashboard. Electronically Signed   By: Elon Alas M.D.   On: 06/13/2017 01:39   Dg Chest Port 1 View  Result Date: 06/12/2017 CLINICAL DATA:  61 year old female status post intubation. EXAM: PORTABLE CHEST 1 VIEW COMPARISON:  06/12/2017 FINDINGS: There has been interval intubation, with the endotracheal tube approximately 4 cm above the level of the carina. A right IJ central venous catheter appears in stable position. Cardiomediastinal  silhouette is somewhat obscured but grossly unchanged in size and configuration. There are continued bilateral interstitial and airspace opacities, left slightly greater than right. This appears minimally progressed from prior comparison examination. No pneumothorax. No acute osseous abnormalities. IMPRESSION: 1. Interval intubation with the endotracheal tube approximately 4 cm above the level of the carina. 2. Slight interval progression of bilateral interstitial and airspace opacities, left greater than right. Electronically Signed   By: Kristopher Oppenheim M.D.   On: 06/12/2017 16:36   Dg Chest Port 1 View  Result Date: 06/12/2017 CLINICAL DATA:  Pulmonary edema. EXAM: PORTABLE CHEST 1 VIEW COMPARISON:  Current chest CT.  Chest radiograph, 06/11/2017. FINDINGS: There are bilateral interstitial and airspace opacities, without significant change from the previous day's study when allowing for differences in patient positioning and radiographic technique. No new lung abnormalities.  No pneumothorax. Right internal jugular central venous line is stable. IMPRESSION: 1. No convincing change from the previous day's study. Persistent bilateral interstitial and airspace lung opacities consistent with extensive pneumonia, pulmonary edema/ARDS, or a combination. Electronically Signed   By: Lajean Manes M.D.   On: 06/12/2017 09:03    Cardiac Studies   None  Patient Profile     61 y.o. female admitted with respiratory failure status post difficult intubation, with evidence of subcutaneous bleeding and emphysema  Assessment & Plan    1.  Atrial fibrillation-her ventricular rate might benefit from some intravenous digoxin.  Her blood pressure will likely preclude other AV nodal blocking agents at this time.   2.  Ventilatory dependent respiratory failure -she may have a component of heart failure, due to diastolic dysfunction as her recent echo demonstrates supra normal LV function EF 65-70% 3.  Hypokalemia -this  is being repleted 4.  Elevated troponin -this is secondary to a supply demand mismatch.  Doubt acute coronary syndrome.  Intravenous Lopressor can be used if needed though I suspect her blood pressure will not allow much.  For questions or updates, please contact [place] Please consult [email or web address] for contact info under Cardiology/STEMI.      Signed, Cristopher Peru, MD  06/13/2017, 10:20 AM  Patient ID: Benson Norway, female   DOB: [birth year], 61 y.o.   MRN: [id number]

## 2017-06-14 ENCOUNTER — Inpatient Hospital Stay (HOSPITAL_COMMUNITY): Payer: Medicare HMO

## 2017-06-14 DIAGNOSIS — R6521 Severe sepsis with septic shock: Secondary | ICD-10-CM

## 2017-06-14 DIAGNOSIS — K7682 Hepatic encephalopathy: Secondary | ICD-10-CM

## 2017-06-14 DIAGNOSIS — A419 Sepsis, unspecified organism: Secondary | ICD-10-CM

## 2017-06-14 DIAGNOSIS — I481 Persistent atrial fibrillation: Secondary | ICD-10-CM

## 2017-06-14 DIAGNOSIS — K7290 Hepatic failure, unspecified without coma: Secondary | ICD-10-CM

## 2017-06-14 LAB — BASIC METABOLIC PANEL
Anion gap: 17 — ABNORMAL HIGH (ref 5–15)
BUN: 41 mg/dL — ABNORMAL HIGH (ref 6–20)
CALCIUM: 8.5 mg/dL — AB (ref 8.9–10.3)
CHLORIDE: 103 mmol/L (ref 101–111)
CO2: 24 mmol/L (ref 22–32)
CREATININE: 2.3 mg/dL — AB (ref 0.44–1.00)
GFR, EST AFRICAN AMERICAN: 25 mL/min — AB (ref >60)
GFR, EST NON AFRICAN AMERICAN: 22 mL/min — AB (ref >60)
Glucose, Bld: 99 mg/dL (ref 65–99)
Potassium: 3.2 mmol/L — ABNORMAL LOW (ref 3.5–5.1)
SODIUM: 144 mmol/L (ref 135–145)

## 2017-06-14 LAB — CBC
HCT: 21.2 % — ABNORMAL LOW (ref 36.0–46.0)
HEMATOCRIT: 21.2 % — AB (ref 36.0–46.0)
HEMOGLOBIN: 7.2 g/dL — AB (ref 12.0–15.0)
HEMOGLOBIN: 6.8 g/dL — AB (ref 12.0–15.0)
MCH: 39.1 pg — AB (ref 26.0–34.0)
MCH: 38.9 pg — ABNORMAL HIGH (ref 26.0–34.0)
MCHC: 34 g/dL (ref 30.0–36.0)
MCHC: 32.1 g/dL (ref 30.0–36.0)
MCV: 115.2 fL — AB (ref 78.0–100.0)
MCV: 121.1 fL — ABNORMAL HIGH (ref 78.0–100.0)
PLATELETS: 29 10*3/uL — AB (ref 150–400)
Platelets: 23 10*3/uL — CL (ref 150–400)
RBC: 1.84 MIL/uL — AB (ref 3.87–5.11)
RBC: 1.75 MIL/uL — ABNORMAL LOW (ref 3.87–5.11)
WBC: 11.9 10*3/uL — ABNORMAL HIGH (ref 4.0–10.5)
WBC: 17.5 10*3/uL — ABNORMAL HIGH (ref 4.0–10.5)

## 2017-06-14 LAB — BLOOD GAS, ARTERIAL
Acid-base deficit: 3.3 mmol/L — ABNORMAL HIGH (ref 0.0–2.0)
Bicarbonate: 23.1 mmol/L (ref 20.0–28.0)
DRAWN BY: 52078
FIO2: 0.7
LHR: 30 {breaths}/min
O2 Saturation: 95.5 %
PEEP/CPAP: 8 cmH2O
Patient temperature: 98.6
VT: 515 mL
pCO2 arterial: 56.2 mmHg — ABNORMAL HIGH (ref 32.0–48.0)
pH, Arterial: 7.237 — ABNORMAL LOW (ref 7.350–7.450)
pO2, Arterial: 78.9 mmHg — ABNORMAL LOW (ref 83.0–108.0)

## 2017-06-14 LAB — GLUCOSE, CAPILLARY
Glucose-Capillary: 97 mg/dL (ref 65–99)
Glucose-Capillary: 93 mg/dL (ref 65–99)

## 2017-06-14 LAB — MAGNESIUM: MAGNESIUM: 2.3 mg/dL (ref 1.7–2.4)

## 2017-06-14 LAB — PREPARE RBC (CROSSMATCH)

## 2017-06-14 LAB — PHOSPHORUS: PHOSPHORUS: 5.7 mg/dL — AB (ref 2.5–4.6)

## 2017-06-14 MED ORDER — SODIUM CHLORIDE 0.9 % IV SOLN
Freq: Once | INTRAVENOUS | Status: AC
  Administered 2017-06-14: 08:00:00 via INTRAVENOUS

## 2017-06-14 MED ORDER — FENTANYL CITRATE (PF) 100 MCG/2ML IJ SOLN
INTRAMUSCULAR | Status: AC
  Administered 2017-06-14: 100 ug
  Filled 2017-06-14: qty 2

## 2017-06-14 MED ORDER — SODIUM CHLORIDE 0.9 % IV SOLN
10.0000 mg/h | INTRAVENOUS | Status: DC
  Administered 2017-06-14: 10 mg/h via INTRAVENOUS
  Filled 2017-06-14: qty 10

## 2017-06-14 MED ORDER — MORPHINE BOLUS VIA INFUSION
5.0000 mg | INTRAVENOUS | Status: DC | PRN
  Administered 2017-06-14: 5 mg via INTRAVENOUS
  Filled 2017-06-14: qty 20

## 2017-06-14 MED ORDER — FENTANYL CITRATE (PF) 100 MCG/2ML IJ SOLN: 50.0000 ug | INTRAMUSCULAR | Status: DC | PRN

## 2017-06-15 ENCOUNTER — Other Ambulatory Visit: Payer: Self-pay

## 2017-06-15 LAB — BPAM RBC
BLOOD PRODUCT EXPIRATION DATE: 201812172359
ISSUE DATE / TIME: 201812100905
UNIT TYPE AND RH: 1700

## 2017-06-15 LAB — TYPE AND SCREEN
ABO/RH(D): B POS
ANTIBODY SCREEN: NEGATIVE
UNIT DIVISION: 0

## 2017-06-15 NOTE — Patient Outreach (Signed)
[facility]) Care Management  06/15/2017  Leah Shah 1955-07-24 [id number]   Update received from Natividad Brood, hospital liaison.  Patient is deceased.  PLAN:  RNCM will refer patient to care management assistant to close due to patient being deceased.  RNCM will notify patients primary MD of closure. A  Quinn Plowman RN,BSN,CCM [facility] Telephonic  [phone]

## 2017-06-16 ENCOUNTER — Telehealth: Payer: Self-pay

## 2017-06-16 ENCOUNTER — Other Ambulatory Visit: Payer: Self-pay | Admitting: Gastroenterology

## 2017-06-16 NOTE — Telephone Encounter (Signed)
On 06/16/2017 I received a d/c from [facility] (cremation). The patient is a patient of Doctor Byrum. The d/c will be taken to Pulmonary Unit @ [facility] tomorrow am for signature.  On 06/18/17 I received the d/c back from Doctor Byrum. I got the d/c ready and called the funeral home to let them know the d/c is ready for pickup. I also faxed a copy to the funeral home per the funeral home request.

## 2017-06-21 ENCOUNTER — Ambulatory Visit: Payer: Medicare HMO | Admitting: Family Medicine

## 2017-06-21 ENCOUNTER — Encounter: Payer: Self-pay | Admitting: Family Medicine

## 2017-07-06 NOTE — Progress Notes (Signed)
PT Cancellation Note  Patient Details Name: Leah Shah MRN: [id number] DOB: [birth year]   Cancelled Treatment:    Reason Eval/Treat Not Completed: Medical issues which prohibited therapy - pt has been intubated. Will sign off for now - please re-order when ready     Duncan Dull 06/23/2017, 8:42 AM  Alben Deeds, PT DPT  Board Certified Neurologic Specialist [phone]

## 2017-07-06 NOTE — Progress Notes (Signed)
Patient extubated per "withdrawal of care" order set.  Tolerated well.

## 2017-07-06 NOTE — Progress Notes (Signed)
Progress Note  Patient Name: Leah Shah Date of Encounter: 2017-06-19  Primary Cardiologist:  [place], previously Dr. Yvone Neu  Subjective   Remains intubated and sedated  Inpatient Medications    Scheduled Meds: . aspirin  81 mg Per Tube Daily  . chlorhexidine  15 mL Mouth Rinse BID  . Chlorhexidine Gluconate Cloth  6 each Topical Daily  . citalopram  10 mg Oral Daily  . lactulose  20 g Oral BID  . mouth rinse  15 mL Mouth Rinse q12n4p  . pantoprazole (PROTONIX) IV  40 mg Intravenous Q24H  . rifaximin  550 mg Oral BID  . sodium chloride flush  10-40 mL Intracatheter Q12H   Continuous Infusions: . sodium chloride 250 mL (06/13/17 2000)  . amiodarone 30 mg/hr (06-19-2017 0800)  . fentaNYL infusion INTRAVENOUS Stopped (06/13/17 1700)  . norepinephrine (LEVOPHED) Adult infusion 15 mcg/min (06-19-2017 0753)   PRN Meds: sodium chloride, fentaNYL, metoprolol tartrate, midazolam, midazolam, RESOURCE THICKENUP CLEAR, sodium chloride flush, white petrolatum   Vital Signs    Vitals:   2017-06-19 0030 2017-06-19 0404 06-19-2017 0500 06/19/2017 0817  BP: (!) 93/35 (!) 99/39  (!) 88/38  Pulse: 89 (!) 104  93  Resp: (!) 21 (!) 38  (!) 35  Temp:      TempSrc:      SpO2: 92% 96%  95%  Weight:   (!) 300 lb 14.9 oz (136.5 kg)   Height:        Intake/Output Summary (Last 24 hours) at Jun 19, 2017 9030 Last data filed at 19-Jun-2017 0000 Gross per 24 hour  Intake 938.92 ml  Output 250 ml  Net 688.92 ml   Filed Weights   06/12/17 0500 06/13/17 0500 2017-06-19 0500  Weight: (!) 315 lb 4.1 oz (143 kg) (!) 300 lb 14.9 oz (136.5 kg) (!) 300 lb 14.9 oz (136.5 kg)    Telemetry    Afib rates 80-100 bpm - Personally Reviewed  ECG    06/09/17 afib rate 140 low voltage   Physical Exam   Intubated Sedated  Chronically ill white female some jaundice  HEENT: Blood in NG tube  Neck right IJ  JVP normal no bruits no thyromegaly Lungs clear with no wheezing and good diaphragmatic  motion Heart:  S1/S2 SEM  murmur, no rub, gallop or click PMI normal Abdomen: benighn, BS positve, no tenderness, no AAA no bruit.  No HSM or HJR Distal pulses intact with no bruits No edema Multiple ecchymosis over chest and mild crepitus    Labs    Chemistry Recent Labs  Lab 06/08/17 0426  06/12/17 0508  06/13/17 0500 06/13/17 2230 2017/06/19 0550  NA 142   < > 143   < > 145 142 144  K 4.1   < > 2.5*   < > 2.5* 2.9* 3.2*  CL 109   < > 104   < > 106 102 103  CO2 25   < > 28   < > 29 25 24   GLUCOSE 144*   < > 151*   < > 111* 143* 99  BUN 26*   < > 23*   < > 33* 38* 41*  CREATININE 0.96   < > 1.22*   < > 1.45* 2.04* 2.30*  CALCIUM 8.7*   < > 8.5*   < > 8.7* 8.3* 8.5*  PROT 5.8*  --  5.1*  --   --   --   --   ALBUMIN 2.6*  --  2.7*  --   --   --   --  AST 41  --  37  --   --   --   --   ALT 28  --  21  --   --   --   --   ALKPHOS 95  --  70  --   --   --   --   BILITOT 10.5*  --  6.9*  --   --   --   --   GFRNONAA >60   < > 46*   < > 38* 25* 22*  GFRAA >60   < > 54*   < > 44* 29* 25*  ANIONGAP 8   < > 11   < > 10 15 17*   < > = values in this interval not displayed.     Hematology Recent Labs  Lab 06/12/17 0508 06/13/17 0500 June 19, 2017 0550  WBC 14.1* 11.9* 17.5*  RBC 1.85* 1.84* 1.75*  HGB 7.3* 7.2* 6.8*  HCT 21.0* 21.2* 21.2*  MCV 113.5* 115.2* 121.1*  MCH 39.5* 39.1* 38.9*  MCHC 34.8 34.0 32.1  RDW NOT CALCULATED NOT CALCULATED NOT CALCULATED  PLT 35* 23* 29*    Cardiac EnzymesNo results for input(s): TROPONINI in the last 168 hours. No results for input(s): TROPIPOC in the last 168 hours.   BNPNo results for input(s): BNP, PROBNP in the last 168 hours.   DDimer No results for input(s): DDIMER in the last 168 hours.   Radiology    Dg Abd 1 View  Result Date: 06/12/2017 CLINICAL DATA:  Orogastric tube placement EXAM: ABDOMEN - 1 VIEW COMPARISON:  None. FINDINGS: Orogastric tube tip is in the proximal stomach with the side port at the gastroesophageal  junction. There are loops of dilated colon. No small bowel dilatation evident. There is fibrotic change in lung bases. IMPRESSION: Orogastric tube tip in proximal stomach with side port gastroesophageal junction. Advise advancing orogastric tube 8 - 10 cm to insure that side-port and tube tip are well within the stomach. Loops of mildly dilated colon noted. No small bowel dilatation evident. No free air appreciable. Electronically Signed   By: Lowella Grip III M.D.   On: 06/12/2017 16:41   Ct Chest Wo Contrast  Result Date: 06/13/2017 CLINICAL DATA:  62 y/o  F; acute respiratory illness. EXAM: CT CHEST WITHOUT CONTRAST TECHNIQUE: Multidetector CT imaging of the chest was performed following the standard protocol without IV contrast. COMPARISON:  06/13/2017 chest radiograph.  06/12/2017 CT chest. FINDINGS: Cardiovascular: Normal caliber thoracic aorta and main pulmonary artery. Mild coronary artery calcifications. Normal heart size. No pericardial effusion. Mediastinum/Nodes: Small volume pneumomediastinum. Enteric tube tip below the field of view into the abdomen. Thyroid gland, trachea, and thoracic esophagus are normal. Endotracheal tube is 5 cm from the carina. Stable mediastinal lymphadenopathy, for example a right lower paratracheal lymph node measures 17 mm short axis, previously 17 mm (series 3, image 59). Lungs/Pleura: Interval increase in diffuse ground-glass and consolidative opacities throughout the lungs best appreciated at the lung bases with focal lobular areas of sparing. Small bilateral pleural effusions. No pneumothorax. Upper Abdomen: Enlarged left lobe of liver with mild lobulation compatible with cirrhosis. Splenomegaly partially visualized. Small stable volume of perihepatic and perisplenic ascites. Musculoskeletal: Extensive subcutaneous emphysema throughout the anterior chest wall. IMPRESSION: 1. Mild interval worsening of diffuse ground-glass and consolidative opacities throughout  the lungs, best appreciated in lung bases. Findings may be related to ARDS, acute interstitial pneumonitis, and/or pulmonary edema. 2. Small volume of pneumomediastinum. Diffuse subcutaneous emphysema throughout anterior chest  wall. No pneumothorax. 3. Cirrhotic liver and splenomegaly. Stable small volume of perihepatic and perisplenic ascites in upper abdomen. 4. Stable mediastinal lymphadenopathy, likely reactive. Electronically Signed   By: Kristine Garbe M.D.   On: 06/13/2017 03:45   Dg Chest Port 1 View  Result Date: 28-Jun-2017 CLINICAL DATA:  62 year old female with widespread bilateral pulmonary opacity/ARDS intubated on 06/12/2017 with development of pneumomediastinum and bilateral subcutaneous emphysema. EXAM: PORTABLE CHEST 1 VIEW COMPARISON:  06/13/2017 and earlier. FINDINGS: Portable AP semi upright view at 0418 hours. Stable endotracheal tube tip at the level the clavicles. Enteric tube courses to the abdomen, side hole likely still at the gastroesophageal junction as demonstrated by CT yesterday. Stable right IJ central line. Bilateral subcutaneous edema appears mildly regressed. Mildly improved bilateral upper lung and perihilar ventilation with improved conspicuity of the mediastinal contours. Otherwise stable diffuse bilateral coarse pulmonary opacity most confluent at the lung bases. No pneumothorax identified. Stable cardiac size and mediastinal contours. IMPRESSION: 1.  Stable lines and tubes. 2. ARDS. Mildly regressed bilateral subcutaneous emphysema and mildly improved bilateral ventilation since yesterday. 3. No new cardiopulmonary abnormality. Electronically Signed   By: Genevie Ann M.D.   On: Jun 28, 2017 06:54   Dg Chest Port 1 View  Result Date: 06/13/2017 CLINICAL DATA:  Subcutaneous emphysema. EXAM: PORTABLE CHEST 1 VIEW COMPARISON:  Chest radiograph June 13, 2017 at 0119 hours FINDINGS: Diffuse subcutaneous emphysema and minimal pneumomediastinum. No pneumothorax.  Diffuse interstitial and to lesser extent alveolar airspace opacities relatively unchanged in a background of chronic interstitial changes. Small pleural effusions. Stable cardiomegaly. Endotracheal tube tip projects approximately 5.9 cm above the carina. RIGHT internal jugular central venous catheter distal tip projects in proximal superior vena cava. Nasogastric tube past GE junction. IMPRESSION: Extensive subcutaneous emphysema with pneumomediastinum. Interstitial and alveolar airspace opacities seen with ARDS, pneumonia. 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 No apparent change in life-support lines. Electronically Signed   By: Elon Alas M.D.   On: 06/13/2017 21:44   Dg Chest Port 1 View  Result Date: 06/13/2017 CLINICAL DATA:  Endotracheal tube placement. EXAM: PORTABLE CHEST 1 VIEW COMPARISON:  Chest radiograph June 12, 2017 FINDINGS: Endotracheal tube tip projects 4.6 cm above the carina. Nasogastric tube past GE junction, distal tip not imaged. RIGHT internal jugular central venous catheter distal tip projects at brachiocephalic confluence, unchanged. New diffuse subcutaneous gas predominately on the RIGHT. Trace potential pneumomediastinum. Stable cardiomegaly. Calcified aortic knob. Diffuse interstitial and alveolar airspace opacities with small layering pleural effusions. No identified pneumothorax. IMPRESSION: New extensive subcutaneous emphysema, no pneumothorax identified. Trace potential pneumomediastinum. No apparent change in life-support lines. Diffuse interstitial and alveolar airspace opacities are similar. Small pleural effusions. Stable cardiomegaly. These results will be called to the ordering clinician or representative by the Radiologist Assistant, and communication documented in the PACS or zVision Dashboard. Electronically Signed   By: Elon Alas M.D.   On: 06/13/2017 01:39   Dg Chest Port 1 View  Result Date: 06/12/2017 CLINICAL DATA:  62 year old female status  post intubation. EXAM: PORTABLE CHEST 1 VIEW COMPARISON:  06/12/2017 FINDINGS: There has been interval intubation, with the endotracheal tube approximately 4 cm above the level of the carina. A right IJ central venous catheter appears in stable position. Cardiomediastinal silhouette is somewhat obscured but grossly unchanged in size and configuration. There are continued bilateral interstitial and airspace opacities, left slightly greater than right. This appears minimally progressed from prior comparison examination. No pneumothorax. No acute osseous abnormalities. IMPRESSION: 1. Interval intubation with the  endotracheal tube approximately 4 cm above the level of the carina. 2. Slight interval progression of bilateral interstitial and airspace opacities, left greater than right. Electronically Signed   By: Kristopher Oppenheim M.D.   On: 06/12/2017 16:36    Cardiac Studies   Echo EF 65-70% mild MR   Patient Profile     62 y.o. female admitted with respiratory failure status post difficult intubation, with evidence of subcutaneous bleeding and emphysema and liver failure Cardiology seeing for afib   Assessment & Plan    1.  Atrial fibrillation-rate not too bad given seriousness of co morbidities  2.  Ventilatory dependent respiratory failure -she may have a component of heart failure, due to diastolic dysfunction as her recent echo demonstrates supra normal LV function EF 65-70% 3.  Hypokalemia -this is being repleted 4.  Elevated troponin -this is secondary to a supply demand mismatch.  Doubt acute coronary syndrome.  Intravenous Lopressor can be used if needed though I suspect her blood pressure will not allow much. 5. Liver failure with hypotension on max levophed plan per CCM She is DNR and not to escalate care   For questions or updates, please contact [place] Please consult [email or web address] for contact info under Cardiology/STEMI.      Signed, Jenkins Rouge, MD  2017-06-16, 9:23 AM   Patient ID: Leah Shah, female   DOB: 05-01-1956, 62 y.o.   MRN: [id number]

## 2017-07-06 NOTE — Progress Notes (Signed)
SLP Cancellation Note  Patient Details Name: Leah Shah MRN: [id number] DOB: 26-Feb-1956   Cancelled treatment:       Reason Eval/Treat Not Completed: Medical issues which prohibited therapy - pt has been intubated. Will sign off for now - please re-order when ready.   Germain Osgood Jul 03, 2017, 8:39 AM   Germain Osgood, M.A. CCC-SLP [phone]

## 2017-07-06 NOTE — Progress Notes (Signed)
Family gathered at the bedside. MD notified that family are ready to remove the tube. Orders were placed in. Morphine drip was started as ordered. Pt discomfort noted, started breathing against the vent. RASS 2. Morphine bolus was given. RASS 3. Family requested "Give here something, so she is comfortable, PLEASE!"  . Versed PRN was given.  It seemed decrease pt's agitation. Small air hunger gasps were made. Relieve seen.  Chaplin at the bedside. Emotional support was given. Pt belongings with the patient.  Tubes removed. Body cleaned and prepared for transport     Medical examinerTim McNeal, [place] called due to pt fall at home and was declined.

## 2017-07-06 NOTE — Progress Notes (Signed)
Progress Note   Subjective  Patient remains intubated, not responding to commands. Some mild red blood per mouth, nursing suspects from recent intubation, but passing brown stools, no obvious GI bleeding. Family has made patient DNR, she is no pressors but no escalation of care.   Objective   Vital signs in last 24 hours: Temp:  [93.7 F (34.3 C)-97.5 F (36.4 C)] 97.5 F (36.4 C) (12/10 0915) Pulse Rate:  [71-145] 94 (12/10 0915) Resp:  [16-38] 23 (12/10 0915) BP: (75-115)/(33-86) 87/38 (12/10 0915) SpO2:  [92 %-100 %] 95 % (12/10 0817) FiO2 (%):  [70 %-100 %] 70 % (12/10 0817) Weight:  [300 lb 14.9 oz (136.5 kg)] 300 lb 14.9 oz (136.5 kg) (12/10 0500) Last BM Date: 06/13/17 General:    white female, intubated Heart:  Regular rate and rhythm Lungs: intubated, Respirations even, some coarse BS B. Crepitus noted in chest wall as well as echymosis  Abdomen:  Soft, nontender, protuberant. . Extremities:  (+) edema. Neurologic:  Nonresponsive, intubated   Intake/Output from previous day: 12/09 0701 - 12/10 0700 In: 1018.9 [I.V.:1018.9] Out: 250 [Urine:250] Intake/Output this shift: Total I/O In: 30 [Blood:30] Out: -   Lab Results: Recent Labs    06/12/17 0508 06/13/17 0500 07/13/17 0550  WBC 14.1* 11.9* 17.5*  HGB 7.3* 7.2* 6.8*  HCT 21.0* 21.2* 21.2*  PLT 35* 23* 29*   BMET Recent Labs    06/13/17 0500 06/13/17 2230 2017/07/13 0550  NA 145 142 144  K 2.5* 2.9* 3.2*  CL 106 102 103  CO2 29 25 24   GLUCOSE 111* 143* 99  BUN 33* 38* 41*  CREATININE 1.45* 2.04* 2.30*  CALCIUM 8.7* 8.3* 8.5*   LFT Recent Labs    06/12/17 0508  PROT 5.1*  ALBUMIN 2.7*  AST 37  ALT 21  ALKPHOS 70  BILITOT 6.9*   PT/INR Recent Labs    06/12/17 1648  LABPROT 28.9*  INR 2.75    Studies/Results: Dg Abd 1 View  Result Date: 06/12/2017 CLINICAL DATA:  Orogastric tube placement EXAM: ABDOMEN - 1 VIEW COMPARISON:  None. FINDINGS: Orogastric tube tip is in the  proximal stomach with the side port at the gastroesophageal junction. There are loops of dilated colon. No small bowel dilatation evident. There is fibrotic change in lung bases. IMPRESSION: Orogastric tube tip in proximal stomach with side port gastroesophageal junction. Advise advancing orogastric tube 8 - 10 cm to insure that side-port and tube tip are well within the stomach. Loops of mildly dilated colon noted. No small bowel dilatation evident. No free air appreciable. Electronically Signed   By: Lowella Grip III M.D.   On: 06/12/2017 16:41   Ct Chest Wo Contrast  Result Date: 06/13/2017 CLINICAL DATA:  62 y/o  F; acute respiratory illness. EXAM: CT CHEST WITHOUT CONTRAST TECHNIQUE: Multidetector CT imaging of the chest was performed following the standard protocol without IV contrast. COMPARISON:  06/13/2017 chest radiograph.  06/12/2017 CT chest. FINDINGS: Cardiovascular: Normal caliber thoracic aorta and main pulmonary artery. Mild coronary artery calcifications. Normal heart size. No pericardial effusion. Mediastinum/Nodes: Small volume pneumomediastinum. Enteric tube tip below the field of view into the abdomen. Thyroid gland, trachea, and thoracic esophagus are normal. Endotracheal tube is 5 cm from the carina. Stable mediastinal lymphadenopathy, for example a right lower paratracheal lymph node measures 17 mm short axis, previously 17 mm (series 3, image 59). Lungs/Pleura: Interval increase in diffuse ground-glass and consolidative opacities throughout the lungs best appreciated  at the lung bases with focal lobular areas of sparing. Small bilateral pleural effusions. No pneumothorax. Upper Abdomen: Enlarged left lobe of liver with mild lobulation compatible with cirrhosis. Splenomegaly partially visualized. Small stable volume of perihepatic and perisplenic ascites. Musculoskeletal: Extensive subcutaneous emphysema throughout the anterior chest wall. IMPRESSION: 1. Mild interval worsening of  diffuse ground-glass and consolidative opacities throughout the lungs, best appreciated in lung bases. Findings may be related to ARDS, acute interstitial pneumonitis, and/or pulmonary edema. 2. Small volume of pneumomediastinum. Diffuse subcutaneous emphysema throughout anterior chest wall. No pneumothorax. 3. Cirrhotic liver and splenomegaly. Stable small volume of perihepatic and perisplenic ascites in upper abdomen. 4. Stable mediastinal lymphadenopathy, likely reactive. Electronically Signed   By: Kristine Garbe M.D.   On: 06/13/2017 03:45   Dg Chest Port 1 View  Result Date: 07-05-17 CLINICAL DATA:  61 year old female with widespread bilateral pulmonary opacity/ARDS intubated on 06/12/2017 with development of pneumomediastinum and bilateral subcutaneous emphysema. EXAM: PORTABLE CHEST 1 VIEW COMPARISON:  06/13/2017 and earlier. FINDINGS: Portable AP semi upright view at 0418 hours. Stable endotracheal tube tip at the level the clavicles. Enteric tube courses to the abdomen, side hole likely still at the gastroesophageal junction as demonstrated by CT yesterday. Stable right IJ central line. Bilateral subcutaneous edema appears mildly regressed. Mildly improved bilateral upper lung and perihilar ventilation with improved conspicuity of the mediastinal contours. Otherwise stable diffuse bilateral coarse pulmonary opacity most confluent at the lung bases. No pneumothorax identified. Stable cardiac size and mediastinal contours. IMPRESSION: 1.  Stable lines and tubes. 2. ARDS. Mildly regressed bilateral subcutaneous emphysema and mildly improved bilateral ventilation since yesterday. 3. No new cardiopulmonary abnormality. Electronically Signed   By: Genevie Ann M.D.   On: 07/05/2017 06:54   Dg Chest Port 1 View  Result Date: 06/13/2017 CLINICAL DATA:  Subcutaneous emphysema. EXAM: PORTABLE CHEST 1 VIEW COMPARISON:  Chest radiograph June 13, 2017 at 0119 hours FINDINGS: Diffuse subcutaneous  emphysema and minimal pneumomediastinum. No pneumothorax. Diffuse interstitial and to lesser extent alveolar airspace opacities relatively unchanged in a background of chronic interstitial changes. Small pleural effusions. Stable cardiomegaly. Endotracheal tube tip projects approximately 5.9 cm above the carina. RIGHT internal jugular central venous catheter distal tip projects in proximal superior vena cava. Nasogastric tube past GE junction. IMPRESSION: Extensive subcutaneous emphysema with pneumomediastinum. Interstitial and alveolar airspace opacities seen with ARDS, pneumonia. 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 No apparent change in life-support lines. Electronically Signed   By: Elon Alas M.D.   On: 06/13/2017 21:44   Dg Chest Port 1 View  Result Date: 06/13/2017 CLINICAL DATA:  Endotracheal tube placement. EXAM: PORTABLE CHEST 1 VIEW COMPARISON:  Chest radiograph June 12, 2017 FINDINGS: Endotracheal tube tip projects 4.6 cm above the carina. Nasogastric tube past GE junction, distal tip not imaged. RIGHT internal jugular central venous catheter distal tip projects at brachiocephalic confluence, unchanged. New diffuse subcutaneous gas predominately on the RIGHT. Trace potential pneumomediastinum. Stable cardiomegaly. Calcified aortic knob. Diffuse interstitial and alveolar airspace opacities with small layering pleural effusions. No identified pneumothorax. IMPRESSION: New extensive subcutaneous emphysema, no pneumothorax identified. Trace potential pneumomediastinum. No apparent change in life-support lines. Diffuse interstitial and alveolar airspace opacities are similar. Small pleural effusions. Stable cardiomegaly. These results will be called to the ordering clinician or representative by the Radiologist Assistant, and communication documented in the PACS or zVision Dashboard. Electronically Signed   By: Elon Alas M.D.   On: 06/13/2017 01:39   Dg Chest [facility] 1 View  Result  Date: 06/12/2017 CLINICAL DATA:  62 year old female status post intubation. EXAM: PORTABLE CHEST 1 VIEW COMPARISON:  06/12/2017 FINDINGS: There has been interval intubation, with the endotracheal tube approximately 4 cm above the level of the carina. A right IJ central venous catheter appears in stable position. Cardiomediastinal silhouette is somewhat obscured but grossly unchanged in size and configuration. There are continued bilateral interstitial and airspace opacities, left slightly greater than right. This appears minimally progressed from prior comparison examination. No pneumothorax. No acute osseous abnormalities. IMPRESSION: 1. Interval intubation with the endotracheal tube approximately 4 cm above the level of the carina. 2. Slight interval progression of bilateral interstitial and airspace opacities, left greater than right. Electronically Signed   By: Kristopher Oppenheim M.D.   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  Critically ill with poor prognosis. Hepatic encephalopathy, not responsive this morning without sedation today, remains intubated with ARDS on xray. Worsening renal function, on pressors for which no plans to escalate further care at this time per chart after discussion with family last night, patient is DNR. Some worsening of chronic anemia but no obvious active GI bleed, passing brown stools, continue protonix for prophylaxis. Subcutaneous edema / crepitus noted on chest wall with diffuse ecchymosis in the setting of coagulopathy, CT chest noted. WBC rising, antibiotics per CCM. Continue supportive care although with poor prognosis would consider palliative care consult if she continues to deteriorate.   Call with questions.   [place], MD [facility] Pager  [phone]

## 2017-07-06 NOTE — Progress Notes (Signed)
[facility] picked up the body

## 2017-07-06 NOTE — Progress Notes (Signed)
240 ml of Morphine was wasted in the sink. Eilleen Kempf, [place] was a witness.

## 2017-07-06 NOTE — Progress Notes (Signed)
Called for comfort care.  Patient dying and family present. Had prayer with patient and family. Offered pastoral presence. SAMANTHAJO PAYANO, Chaplain    12-Jul-2017 1400  Clinical Encounter Type  Visited With Patient and family together  Visit Type Initial;Spiritual support;Social support;Death;Patient actively dying  Referral From Nurse  Spiritual Encounters  Spiritual Needs Prayer  Stress Factors  Family Stress Factors Loss

## 2017-07-06 NOTE — Progress Notes (Signed)
PULMONARY / CRITICAL CARE MEDICINE   Name: Leah Shah MRN: [id number] DOB: 14-May-1956    ADMISSION DATE:  05/21/2017 CONSULTATION DATE: 05/26/2017  REFERRING MD:  Eulis Foster  CHIEF COMPLAINT:  AMS  HISTORY OF PRESENT ILLNESS:   Leah Shah is a 62 y.o. female with PMH of cirrhosis, chronic hepatitis, portal hypertension, recently diagnosed Wilson's disease (mildly elevated ceruloplasmin, 24 hr urinary copper, liver biopsy copper via transjugular liver biopsy 05/07/17, NO Beatriz Chancellor rings on ophthalmology eval).  She is followed by Dr. Marius Ditch at [facility] and she is apparently being evaluated for liver transplant at [facility].  She presented to [facility] ED 11/30 with altered mental status.  She has apparently been confused for the past several days per her daughter-in-law.  Daughter-in-law and additional family had been trying to convince her to move in with family so that they could help her with daily activities and to ensure that she would take her medications as prescribed.  Patient has apparently not been good about taking her medicines as prescribed and misses several doses on a daily basis.  Due to this, she was supposed to move in with daughter-in-law on 11/30.  When daughter-in-law called her that morning, there was no answer despite several calls.  Daughter-in-law then went to patient's home and found her on the floor sitting next to the chair.  Patient had told her that she apparently had a fall, was able to tell that she did not hit her head or lose consciousness. She had not had any recent complaints and had reportedly been in her usual state of health besides for confusion (felt to be due to missing lactulose), and decreased PO intake.  In ED, she was found to be encephalopathic, hypotensive, hypothermic.  Potassium was greater than 7.5 (she received temporizing measures), ammonia 163, lactate 4.13 (repeat 4.82 > 1.91).  PCCM consulted for admission  Of note, she has had extensive  workup of her liver disease including normal iron & ferritin, Hepatitis total A Ab positive, Hepatitis B surface Ag negative, Hepatitis B surface Ab mildly positive,& Hepatitis C Ab negative, HIV negative, ANA negative, AMA & anti-SMA negative, LKM ab negative, Immunoglobulins normal, A-1 antitrypsin normal, celiac serology negative.  As mentioned above, she recently had mildly elevated ceruloplasmin, 24 hr urinary copper, liver biopsy copper via transjugular liver biopsy 05/07/17, NO [employer] Fleischer rings on ophthalmology eval.   SUBJECTIVE:   Significant subcutaneous air and pneumomediastinum noted post intubation although no pneumothorax.  Subcutaneous air appears to be improved.  Atrial fibrillation with RVR likely is a stress response to her overall critical illness No significant change in her neurological status  VITAL SIGNS: BP (!) 82/43   Pulse (!) 101   Temp 97.6 F (36.4 C) (Oral)   Resp (!) 35   Ht 5\' 8"  (1.727 m)   Wt (!) 136.5 kg (300 lb 14.9 oz)   SpO2 96%   BMI 45.76 kg/m   HEMODYNAMICS:    VENTILATOR SETTINGS: Vent Mode: PRVC FiO2 (%):  [60 %-100 %] 60 % Set Rate:  [32 bmp] 32 bmp Vt Set:  [550 mL] 550 mL PEEP:  [8 cmH20] 8 cmH20 Plateau Pressure:  [20 cmH20-28 cmH20] 28 cmH20  INTAKE / OUTPUT: I/O last 3 completed shifts: In: 1568.9 [I.V.:1568.9] Out: 2585 [Urine:1650]  PHYSICAL EXAMINATION:  General: Obese ill-appearing woman on mechanical ventilation HEENT: Scleral icterus, no bleeding from nose or oropharynx, Neuro: Unresponsive, will not move or grimace to voice or stem CV: Regular, tachycardic,  no murmur PULM: Bilateral inspiratory crackles GI: Obese, soft, positive bowel sounds Extremities: 2+ lower extremity edema Skin: Significant jaundice, ecchymoses on her chest, subcutaneous air on her chest  LABS:  BMET Recent Labs  Lab 06/13/17 0500 06/13/17 2230 07/09/17 0550  NA 145 142 144  K 2.5* 2.9* 3.2*  CL 106 102 103  CO2 29 25 24   BUN  33* 38* 41*  CREATININE 1.45* 2.04* 2.30*  GLUCOSE 111* 143* 99   Electrolytes Recent Labs  Lab 06/12/17 0508  06/13/17 0500 06/13/17 2230 07-09-2017 0550  CALCIUM 8.5*   < > 8.7* 8.3* 8.5*  MG 1.5*   < > 1.9 2.2 2.3  PHOS 3.5  --  4.7*  --  5.7*   < > = values in this interval not displayed.   CBC Recent Labs  Lab 06/12/17 0508 06/13/17 0500 07/09/2017 0550  WBC 14.1* 11.9* 17.5*  HGB 7.3* 7.2* 6.8*  HCT 21.0* 21.2* 21.2*  PLT 35* 23* 29*   Coag's Recent Labs  Lab 06/08/17 0426 06/10/17 0247 06/12/17 1648  INR 2.36 2.51 2.75   Sepsis Markers Recent Labs  Lab 06/09/17 1508  06/09/17 2114 06/10/17 0247 06/10/17 0835 06/11/17 0547  LATICACIDVEN  --    < > 4.7* 3.6* 2.8*  --   PROCALCITON 0.10  --   --  0.14  --  0.21   < > = values in this interval not displayed.   ABG Recent Labs  Lab 06/12/17 1750 06/12/17 2008 07-09-17 0329  PHART 7.219* 7.316* 7.237*  PCO2ART 73.5* 55.7* 56.2*  PO2ART 86.6 140.0* 78.9*   Liver Enzymes Recent Labs  Lab 06/08/17 0426 06/12/17 0508  AST 41 37  ALT 28 21  ALKPHOS 95 70  BILITOT 10.5* 6.9*  ALBUMIN 2.6* 2.7*   Cardiac Enzymes No results for input(s): TROPONINI, PROBNP in the last 168 hours.  Glucose Recent Labs  Lab 06/13/17 1129 06/13/17 1517 06/13/17 1953 06/13/17 2352 Jul 09, 2017 0342 Jul 09, 2017 0947  GLUCAP 91 88 88 112* 97 93   Imaging Dg Chest Port 1 View  Result Date: 2017/07/09 CLINICAL DATA:  62 year old female with widespread bilateral pulmonary opacity/ARDS intubated on 06/12/2017 with development of pneumomediastinum and bilateral subcutaneous emphysema. EXAM: PORTABLE CHEST 1 VIEW COMPARISON:  06/13/2017 and earlier. FINDINGS: Portable AP semi upright view at 0418 hours. Stable endotracheal tube tip at the level the clavicles. Enteric tube courses to the abdomen, side hole likely still at the gastroesophageal junction as demonstrated by CT yesterday. Stable right IJ central line. Bilateral  subcutaneous edema appears mildly regressed. Mildly improved bilateral upper lung and perihilar ventilation with improved conspicuity of the mediastinal contours. Otherwise stable diffuse bilateral coarse pulmonary opacity most confluent at the lung bases. No pneumothorax identified. Stable cardiac size and mediastinal contours. IMPRESSION: 1.  Stable lines and tubes. 2. ARDS. Mildly regressed bilateral subcutaneous emphysema and mildly improved bilateral ventilation since yesterday. 3. No new cardiopulmonary abnormality. Electronically Signed   By: Genevie Ann M.D.   On: 2017/07/09 06:54   Dg Chest Port 1 View  Result Date: 06/13/2017 CLINICAL DATA:  Subcutaneous emphysema. EXAM: PORTABLE CHEST 1 VIEW COMPARISON:  Chest radiograph June 13, 2017 at 0119 hours FINDINGS: Diffuse subcutaneous emphysema and minimal pneumomediastinum. No pneumothorax. Diffuse interstitial and to lesser extent alveolar airspace opacities relatively unchanged in a background of chronic interstitial changes. Small pleural effusions. Stable cardiomegaly. Endotracheal tube tip projects approximately 5.9 cm above the carina. RIGHT internal jugular central venous catheter distal tip projects in  proximal superior vena cava. Nasogastric tube past GE junction. IMPRESSION: Extensive subcutaneous emphysema with pneumomediastinum. Interstitial and alveolar airspace opacities seen with ARDS, pneumonia. 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 No apparent change in life-support lines. Electronically Signed   By: Elon Alas M.D.   On: 06/13/2017 21:44   STUDIES:  CXR 11/30 > chronic interstitial disease. CXR 12/1> ETT in place, No acute changes Ultrasound abdomen 12/1> cirrhotic liver, minimal ascites Echocardiogram 12/1> mild LVH, LVEF 65-70%. CT chest abd/pelv 12/2 > possible multifocal PNA vs asymmetric pulmonary edema CT Chest 12/7 >>   CULTURES:  Blood 11/30 >> negative  Urine 11/30 >> negative  ANTIBIOTICS: Vanc 11/30 >>  12/7 Zosyn 11/30 >> 12/5  Fortaz 12/5 >> 12/8  SIGNIFICANT EVENTS: 11/30  Admit 12/05  Tx to ICU for AFwRVR, increased WOB   12/07  Increased O2 needs, CXR with worsening edema  LINES/TUBES: ETT 12/1 > 12/3>>>12/8>>> CVL Rt IJ 12/1> Radial a line 11/30 > 12/1  DISCUSSION: 62 y.o. female with PMH including NASH cirrhosis, chronic hepatitis, portal hypertension,Wilson's disease. She is followed by Dr. Marius Ditch at [facility] and she is apparently being evaluated for liver transplant at [facility].  She presented [facility] 11/30 with acute encephalopathy and concern for sepsis of unclear etiology.  She was subsequently admitted to the ICU for further eval and management.  Transferred to the floor on 06/08/2017 and during the early a.m. of 06/09/2017 developed atrial fibrillation rapid ventricular response along with hypotension.  She was placed on diltiazem drip with some improvement in her heart rate.  She is dyspneic with any exertion.  Stat portable chest x ray revealed frank pulmonary edema.  Due to her multiple medical problems, morbid obesity, pulmonary edema with recent extubation on 06/07/2017, she will be returned to the intensive care for further evaluation and treatment.   ASSESSMENT / PLAN:  PULMONARY A: Acute resp failure - s/p intubation, extubated 12/3 and tolerated well Acute Respiratory Distress - 12/4, in setting of AFwRVR Pneumomediastinum and SQ air, improving Suspected acute lung injury versus cardiogenic pulmonary edema P:   Mental status precludes any attempts at spontaneous breathing or extubation at this time Limiting her PEEP given her pneumomediastinum, presumed barotrauma Wean FiO2 as possible Empiric antibiotics completed for possible pneumonia as below  CARDIOVASCULAR A:  Sepsis - of unclear etiology, possible peritonitis in setting ascites / cirrhosis.  A repeat sepsis assessment has been performed. Elevated troponin, consistent with a stress non-ST elevation  MI Hx HTN, HLD, chronic dCHF (echo from Feb 2018 with EF 01-60%, LV diastolic dysfunction, mild MR, mod LA dilation). P:  Amiodarone drip, rate control Continue aspirin Continue ASA Lasix drip discontinued Appreciate cardiology assistance Home atenolol, spironolactone are on hold  RENAL A:   Hyponatremia - presumed due to hypovolemia, cirrhosis.  Resolved. Hypernatremia  Hyperkalemia - s/p temporizing measures in ED.   Resolved. NAGMA - resolved. AKI - improving. Hypo-magnesium P:   No longer on Lasix, metolazone, Diamox Following BMP and urine output Note profound total body volume overload  GASTROINTESTINAL A:   Hx NASH cirrhosis, chronic hepatitis, recent dx Wilson's disease (followed by Dr. Marius Ditch at [facility]) - reportedly being worked up for transplant at [facility]. Hyperbilirubinemia - chronic, due to above. No ascites noted on abd ultrasound Dysphagia  P:   Appreciate GI input Lactulose rifaximin Unfortunately given her overall status she is likely not going to be a candidate for transplant.  Suspect that she will not recover from this acute decompensation of her  chronic illness.  Discussed this with her family 12/9 and also 12/10  HEMATOLOGIC A:   Anemia - chronic, s/p 1u PRBC transfusion 12/2 Thrombocytopenia - chronic. VTE Prophylaxis. P:  Following CBC and INR SCD in place  INFECTIOUS A:   Sepsis - of unclear etiology, possible peritonitis in setting ascites / cirrhosis.  Ultrasound with minimal peritoneal fluid making peritonitis unlikely.  Possible multifocal PNA based on CT chest.    P:   Off antibiotics  ENDOCRINE A: No acute issues. P: Monitor glucose on BMP   NEUROLOGIC A:   Acute encephalopathy - presumed multifactorial in setting hepatic.  Soft encephalopathy + sepsis. Hx headache, anxiety. P:   Continue lacutlose, rifaximin Continue citalopram  Holding home hydroxyzine, Oxy IR, trazodone (PRN), cyclobenzaprine, PRN xanax Sedation on  hold  Family updated:  LCB with short term intubation and pressors only, no CPR, cardioversion, trach or peg  I discussed her status, course with her sister at bedside today 12/10.  She and family are aware that the likelihood for meaningful recovery here is very low.  I do not believe she has a reasonable chance to be bridged with supportive care to transplant.  Based on her status limitations on her care have been placed.  We will not up titrate her pressors.  I suspect that the family will want to withdraw care and extubate to comfort in the near future.  Independent CC time 34 minutes  Baltazar Apo, MD, PhD 2017/06/30, 12:46 PM  Pulmonary and Critical Care [phone] or if no answer [phone]

## 2017-07-06 DEATH — deceased

## 2017-08-06 NOTE — Discharge Summary (Signed)
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 female with PMH of cirrhosis, chronic hepatitis, portal hypertension, recent diagnosis Wilson's disease (mildly elevated ceruloplasmin, 24 hr urinary copper, liver biopsy copper via transjugular liver biopsy 05/07/17, NO [employer] Fle[employer] rings on ophthalmology eval).  She had been followed by Dr. Marius Ditch at [facility] and she is apparently being evaluated for liver transplant at [facility].  She had extensive workup of her liver disease including normal iron & ferritin, Hepatitis total A Ab positive, Hepatitis B surface Ag negative, Hepatitis B surface Ab mildly positive,& Hepatitis C Ab negative, HIV negative, ANA negative, AMA & anti-SMA negative, LKM ab negative, Immunoglobulins normal, A-1 antitrypsin normal, celiac serology negative.  As mentioned above, she had mildly elevated ceruloplasmin, 24  hr urinary copper, liver biopsy copper via transjugular liver biopsy 05/07/17, NO Kaiser Fleischer rings on ophthalmology eval.  She presented to [facility] ED 06/08/2023 with altered mental status, family found her on the floor sitting next to the chair after apparent fall.   In ED, she was found to be encephalopathic, hypotensive, hypothermic.  Potassium was greater than 7.5 (she received temporizing measures), ammonia 163, lactate 4.13 (repeat 4.82 > 1.91).  PCCM consulted for admission.  She required intubation for airway protection and in the setting of bilateral multifocal pulmonary infiltrates.  She was treated empirically for multifocal pneumonia with vancomycin, Zosyn.  Her encephalopathy was felt to be exacerbated by severe sepsis and also difficulty complying with her lactulose.  With treatment and with reinitiation of lactulose her mental status improved she was able to be extubated 12/3.  Transferred to floor bed on 12/4.  Unfortunately he developed atrial fibrillation with rapid ventricular response with associated worsening of her pulmonary infiltrates, mental status, hemodynamic status.  She moved back to the ICU and was urgently reintubated, treated with amiodarone.  Her atenolol, spironolactone, furosemide were all held given her hemodynamic instability.  Post reintubation she was noted to have pneumomediastinum, presumed due to high PEEP needs to maintain adequate oxygenation and ventilation.  Also acute renal insufficiency.  Empiric antibiotics were continued, ceftazidime, although it was felt that her pulmonary infiltrates were more likely related to cardiogenic pulmonary edema in the setting of atrial fibrillation.  Unfortunately given her underlying liver disease it was felt that the chances to recover from this acute illness and then survive to be bridged with supportive care to transplant were very small.  Based on this discussions were undertaken with the patient's family regarding goals of care.  It  was felt that she would not want continued extraordinary care she did not have a reasonable chance for a recovery to her previous baseline.  Decision was made to  transition to comfort care 16-Jun-2017.  She expired on the same day.   STUDIES:  CXR 11/30 > chronic interstitial disease. CXR 12/1> ETT in place, No acute changes Ultrasound abdomen 12/1> cirrhotic liver, minimal ascites Echocardiogram 12/1> mild LVH, LVEF 65-70%. CT chest abd/pelv 12/2 > possible multifocal PNA vs asymmetric pulmonary edema CT Chest 12/7 >>   CULTURES:  Blood 11/30 >> negative  Urine 11/30 >> negative  ANTIBIOTICS: Vanc 11/30 >> 12/7 Zosyn 11/30 >> 12/5  Fortaz 12/5 >> 12/8  SIGNIFICANT EVENTS: 11/30  Admit 12/05  Tx to ICU for AFwRVR, increased WOB   12/07  Increased O2 needs, CXR with worsening edema  LINES/TUBES: ETT 12/1 > 12/3>>>12/8>>> CVL Rt IJ 12/1> Radial a line 11/30 > 12/1   Baltazar Apo, MD, PhD 07/22/2017, 8:10 AM [facility] [phone] or if no answer [phone]

## 2017-12-31 IMAGING — DX DG CHEST 1V PORT
1 series · 1 of 1 positions shown · non-contrast
Comparison: 06/12/2017

CLINICAL DATA: 61-year-old female status post intubation.

EXAM:
PORTABLE CHEST 1 VIEW

[chest ap]
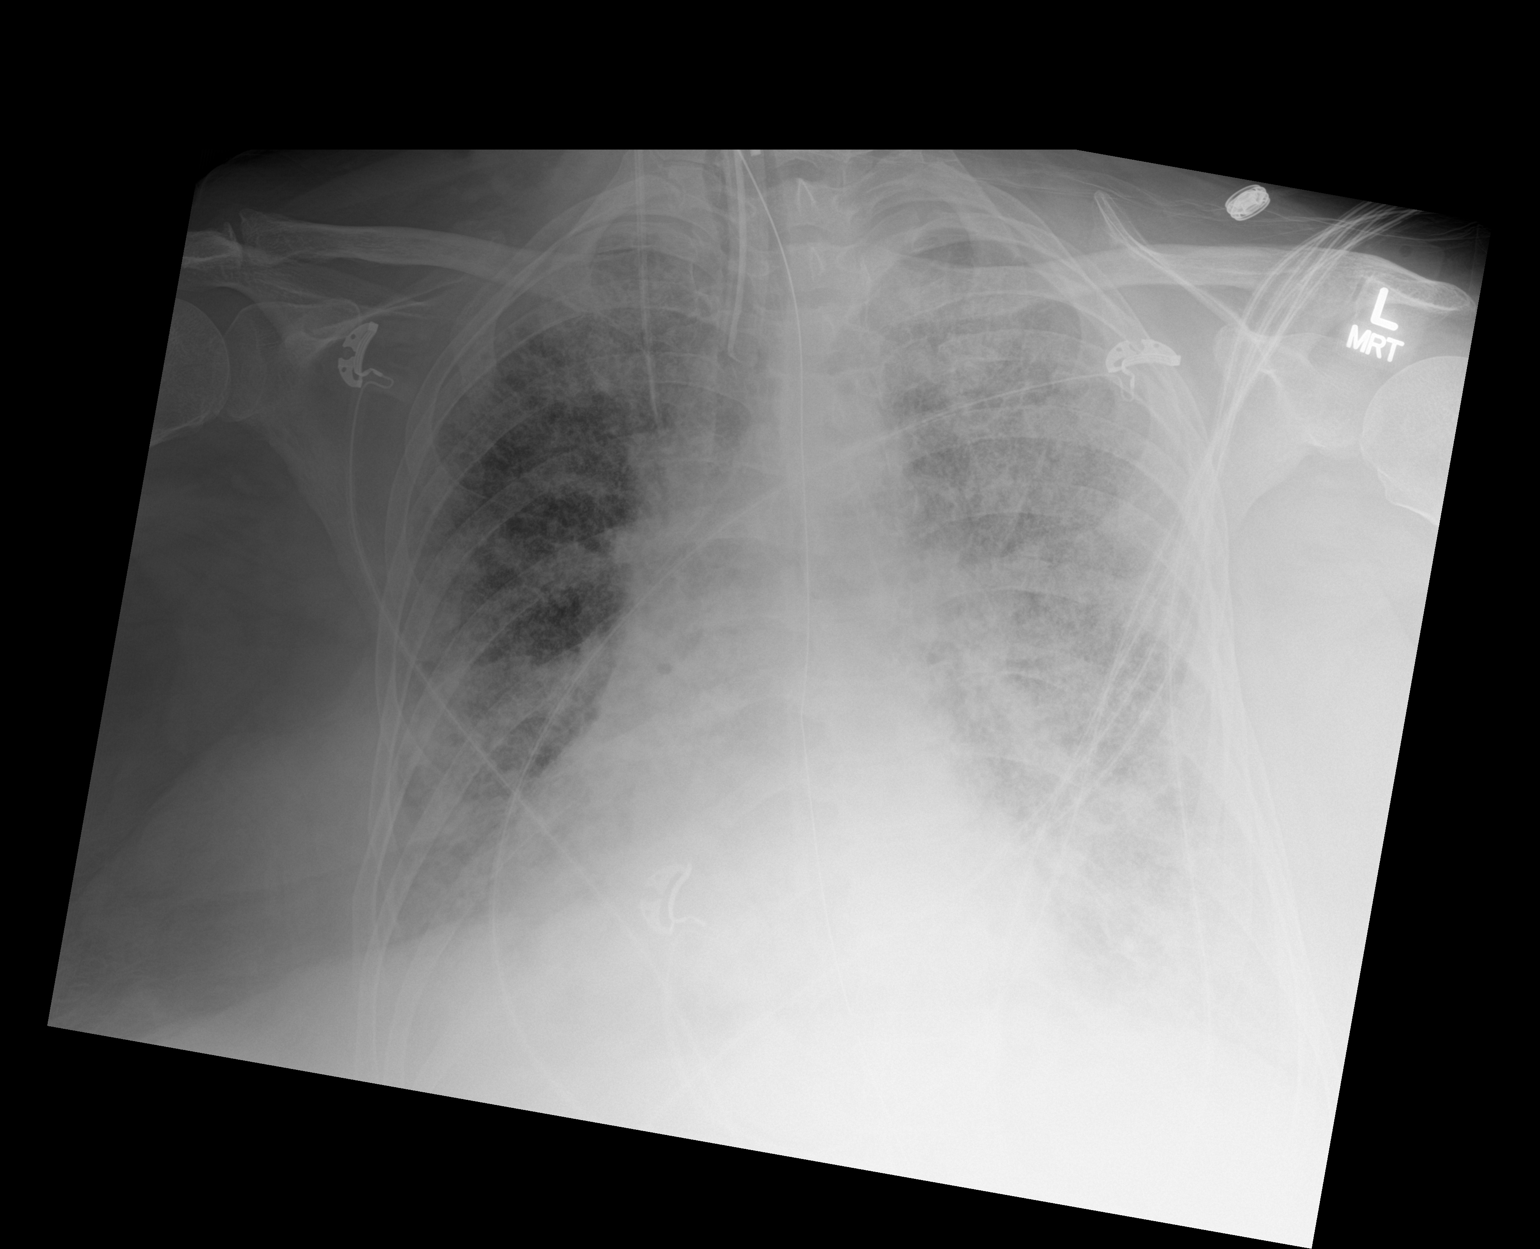

[1 of 1 positions shown; findings below may reference images not displayed]

FINDINGS: There has been interval intubation, with the endotracheal tube
approximately 4 cm above the level of the carina. A right IJ central
venous catheter appears in stable position.

Cardiomediastinal silhouette is somewhat obscured but grossly
unchanged in size and configuration.

There are continued bilateral interstitial and airspace opacities,
left slightly greater than right. This appears minimally progressed
from prior comparison examination. No pneumothorax.

No acute osseous abnormalities.
IMPRESSION: 1. Interval intubation with the endotracheal tube approximately 4 cm
above the level of the carina.
2. Slight interval progression of bilateral interstitial and
airspace opacities, left greater than right.

## 2018-01-01 IMAGING — DX DG CHEST 1V PORT
1 series · 1 of 1 positions shown · non-contrast
Comparison: Chest radiograph June 12, 2017

CLINICAL DATA: Endotracheal tube placement.

EXAM:
PORTABLE CHEST 1 VIEW

[chest ap]
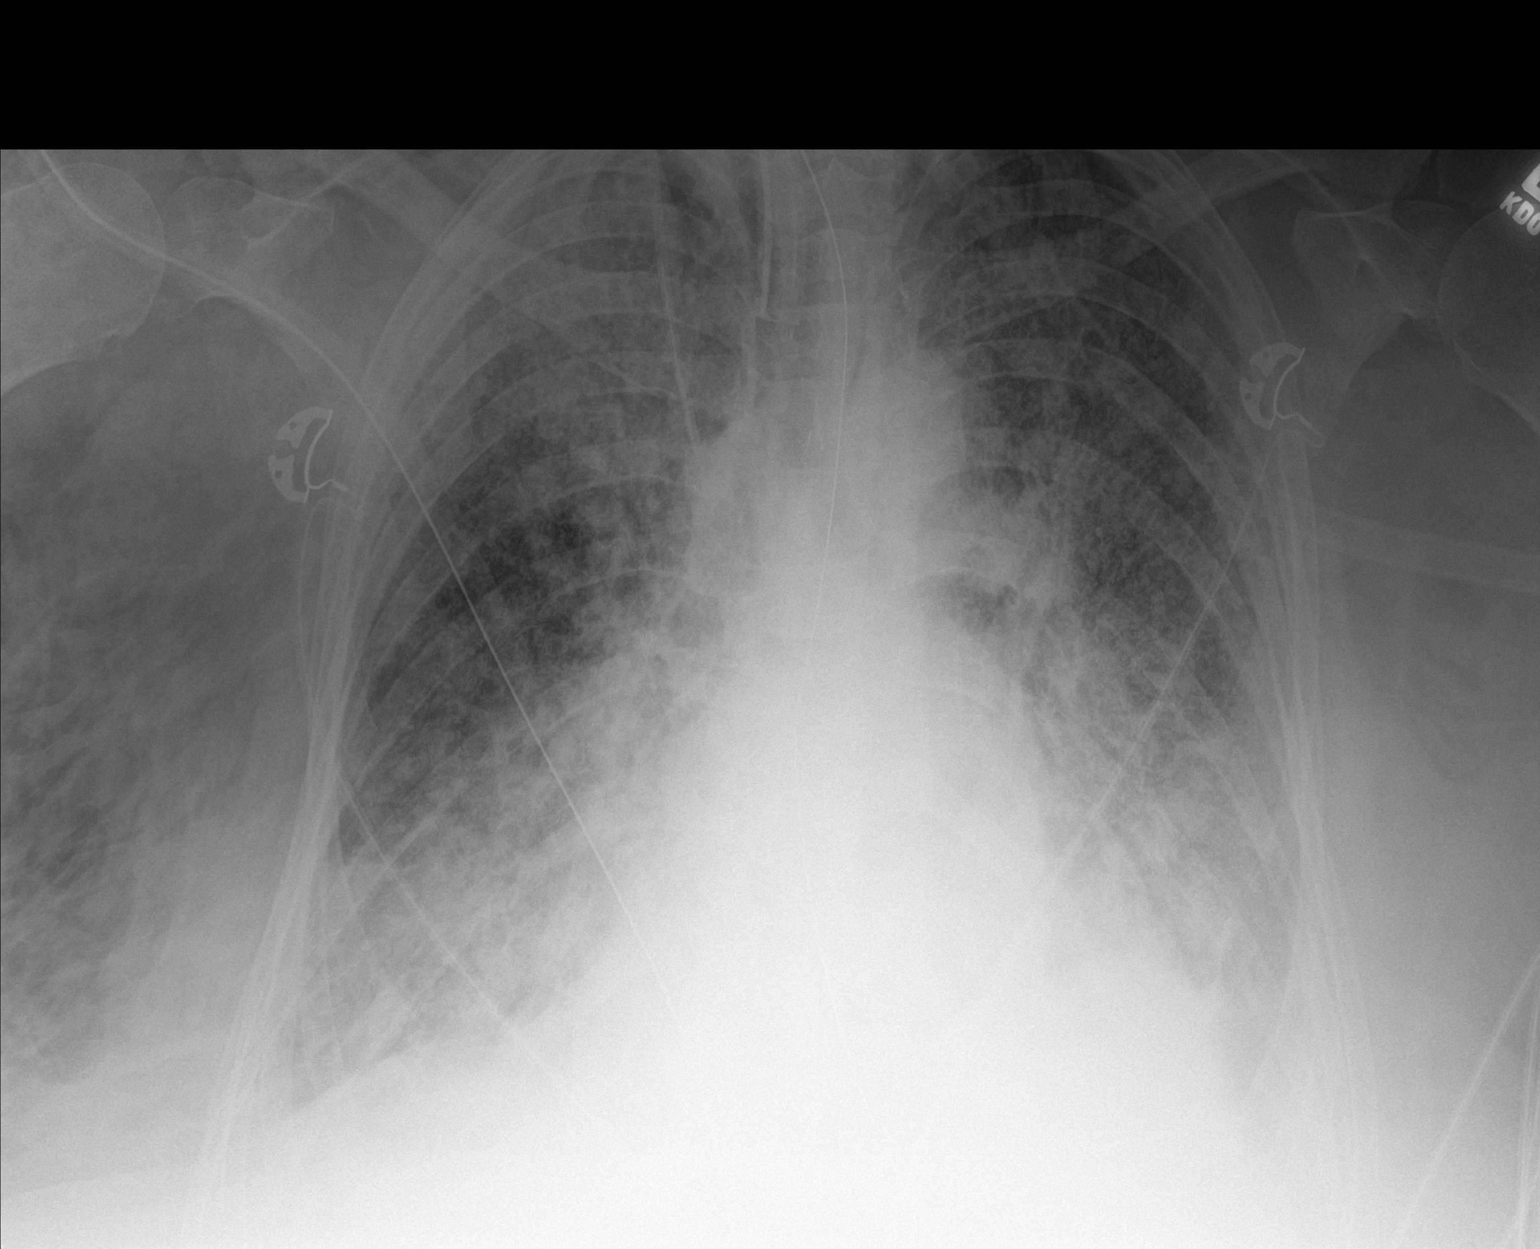

[1 of 1 positions shown; findings below may reference images not displayed]

FINDINGS: Endotracheal tube tip projects 4.6 cm above the carina. Nasogastric
tube past GE junction, distal tip not imaged. RIGHT internal jugular
central venous catheter distal tip projects at brachiocephalic
confluence, unchanged. New diffuse subcutaneous gas predominately on
the RIGHT. Trace potential pneumomediastinum.

Stable cardiomegaly. Calcified aortic knob. Diffuse interstitial and
alveolar airspace opacities with small layering pleural effusions.
No identified pneumothorax.
IMPRESSION: New extensive subcutaneous emphysema, no pneumothorax identified.
Trace potential pneumomediastinum. No apparent change in
life-support lines.

Diffuse interstitial and alveolar airspace opacities are similar.
Small pleural effusions. Stable cardiomegaly.

These results will be called to the ordering clinician or
representative by the Radiologist Assistant, and communication
documented in the PACS or zVision Dashboard.

## 2018-01-01 IMAGING — DX DG CHEST 1V PORT
1 series · 1 of 1 positions shown · non-contrast
Comparison: Chest radiograph June 13, 2017 at 6887 hours

CLINICAL DATA: Subcutaneous emphysema.

EXAM:
PORTABLE CHEST 1 VIEW

[chest]
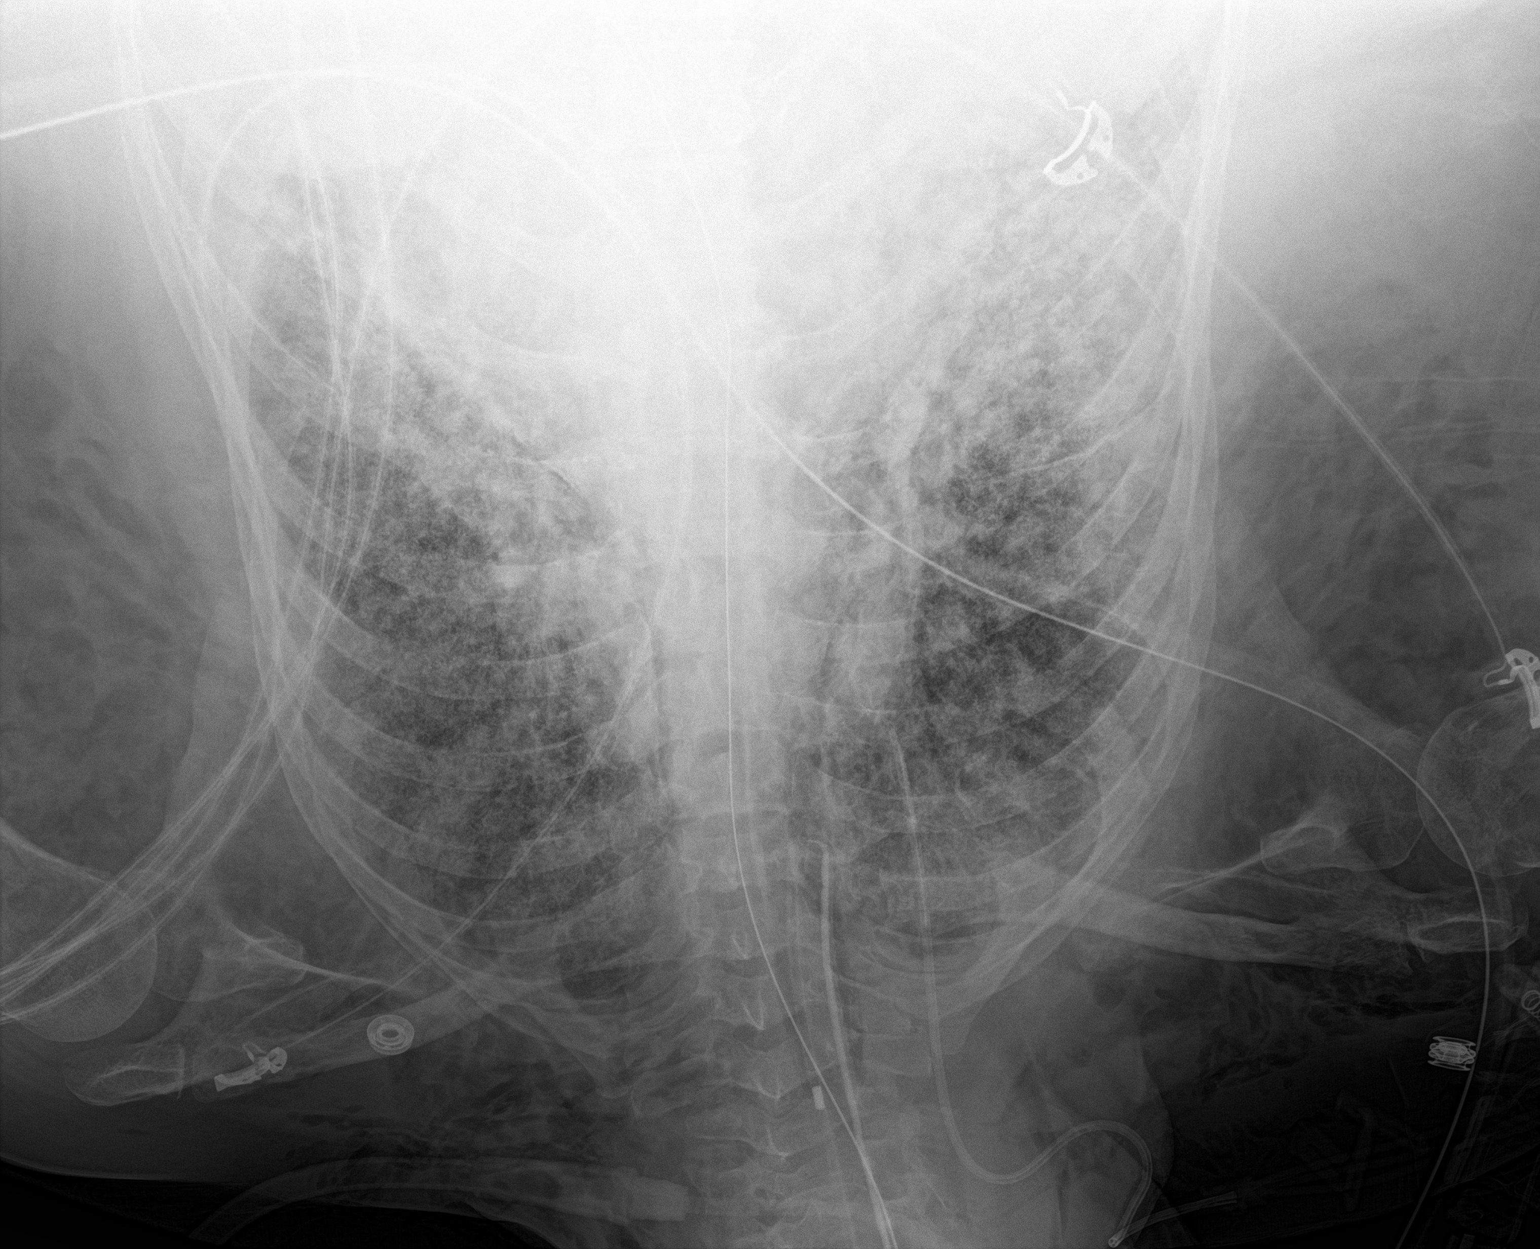

[1 of 1 positions shown; findings below may reference images not displayed]

FINDINGS: Diffuse subcutaneous emphysema and minimal pneumomediastinum. No
pneumothorax. Diffuse interstitial and to lesser extent alveolar
airspace opacities relatively unchanged in a background of chronic
interstitial changes. Small pleural effusions. Stable cardiomegaly.
Endotracheal tube tip projects approximately 5.9 cm above the
carina. RIGHT internal jugular central venous catheter distal tip
projects in proximal superior vena cava. Nasogastric tube past GE
junction.
IMPRESSION: Extensive subcutaneous emphysema with pneumomediastinum.

Interstitial and alveolar airspace opacities seen with ARDS,
pneumonia. Background of probable chronic CHF/COPD.

No apparent change in life-support lines.

## 2018-06-07 IMAGING — US US ABDOMEN LIMITED
1 series · 4 of 4 positions shown · non-contrast
Comparison: CT of the abdomen on 04/03/2017

CLINICAL DATA: Cirrhotic liver disease and ascites. Evaluation of
ascites performed prior to possible paracentesis.

EXAM:
LIMITED ABDOMEN ULTRASOUND FOR ASCITES
TECHNIQUE: Limited ultrasound survey for ascites was performed in all four
abdominal quadrants.

[Series 1: us abdomen limited · 0.26mm/px · 4 of 4 slices shown]
[im 1/4]
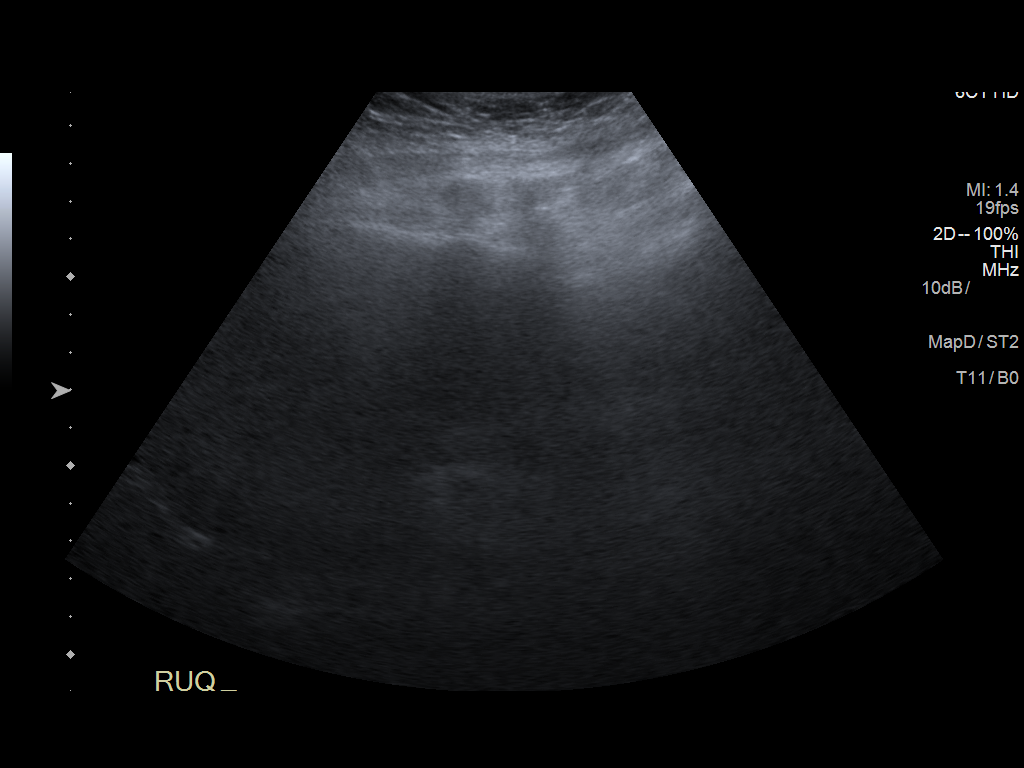
[im 2/4]
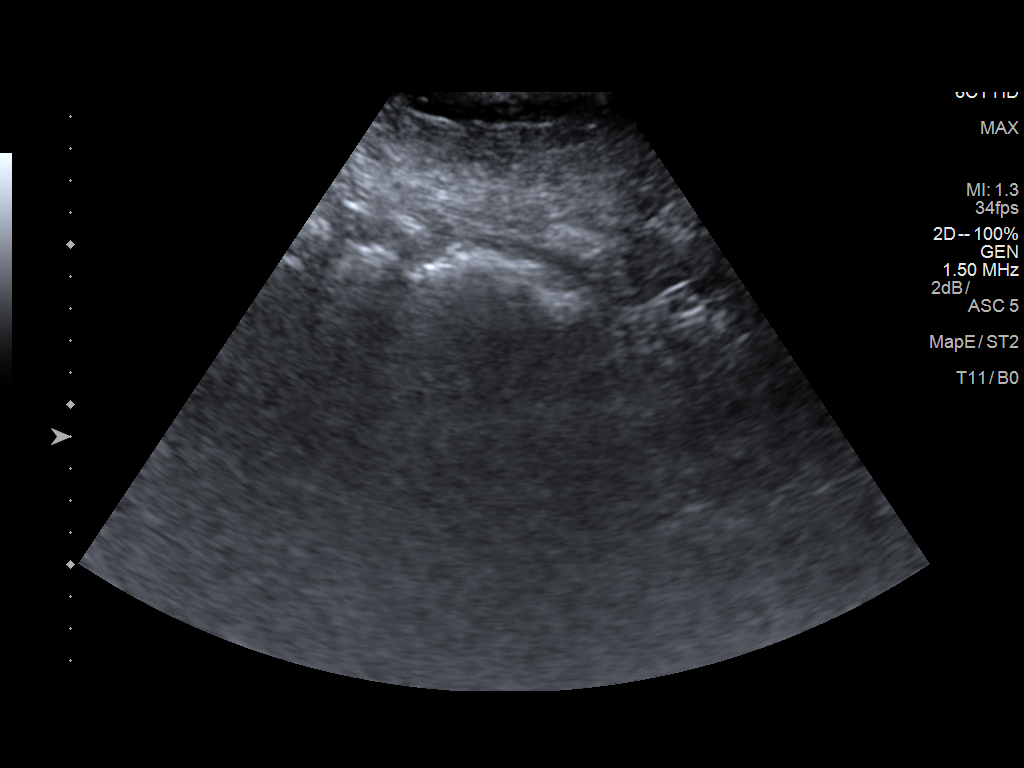
[im 3/4]
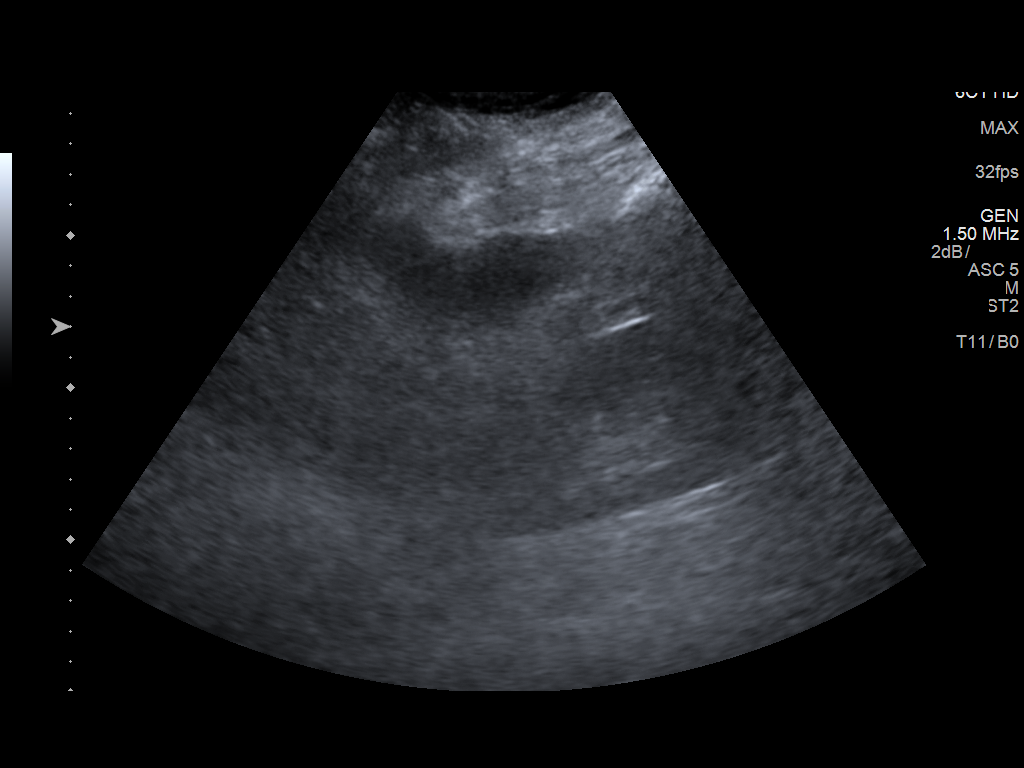
[im 4/4]
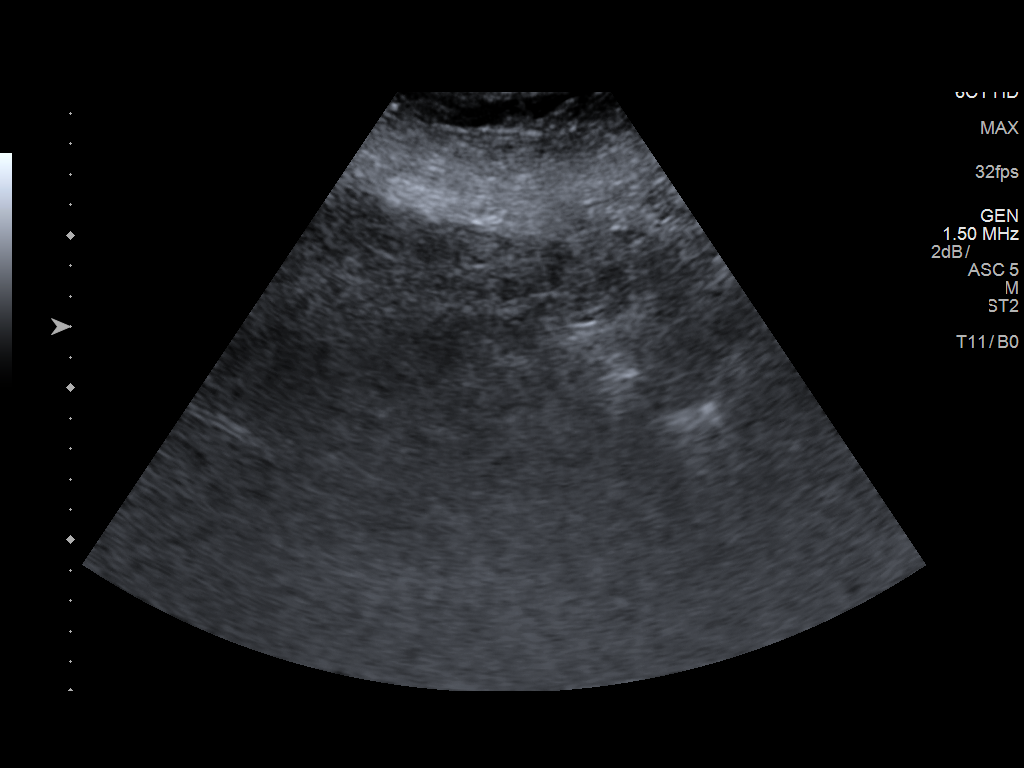

[4 of 4 positions shown; findings below may reference images not displayed]

FINDINGS: Ultrasound demonstrates only a trace amount of ascites scattered
between bowel loops in the peritoneal cavity. There is not enough
fluid to allow paracentesis.
IMPRESSION: Trace ascites in the peritoneal cavity. There was not enough fluid
present to allow for paracentesis.

## 2018-07-22 IMAGING — US US EXTREM LOW VENOUS*R*
1 series · 13 of 24 positions shown · non-contrast
Comparison: None.

CLINICAL DATA: Right lower extremity pain and edema since falling 2
weeks ago. Evaluate for DVT.



[Series 1: us extrem low venous*right* · 0.08mm/px · 13 of 27 slices shown]
[im 1/27]
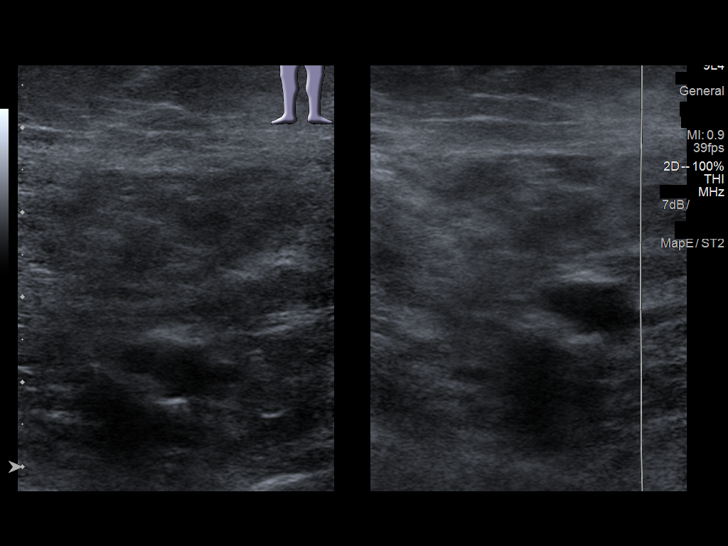
[im 3/27]
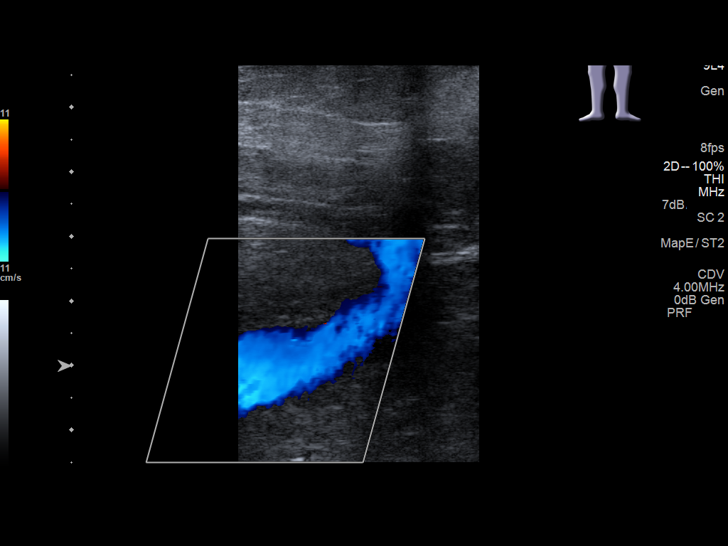
[im 5/27]
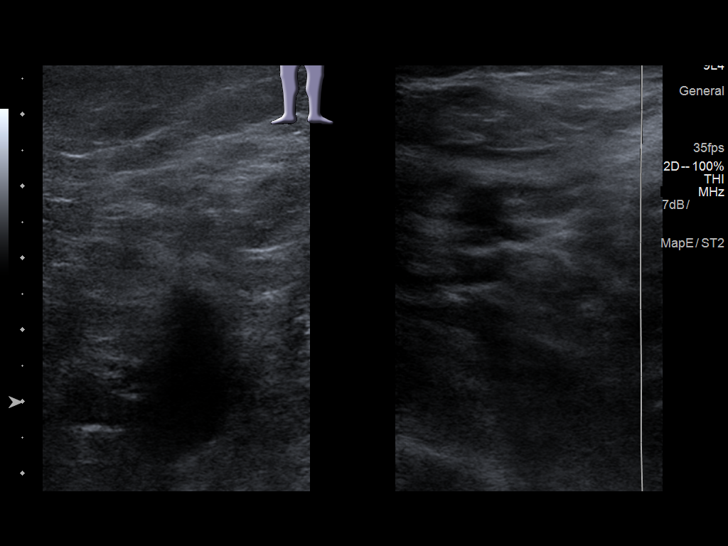
[im 7/27]
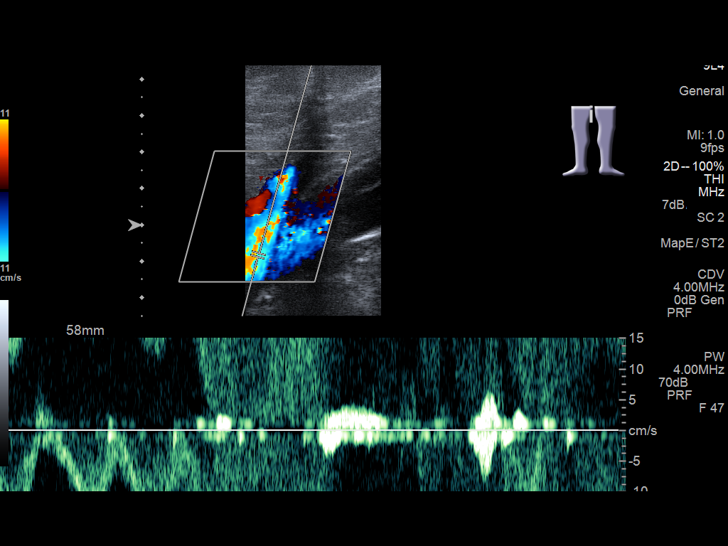
[im 10/27]
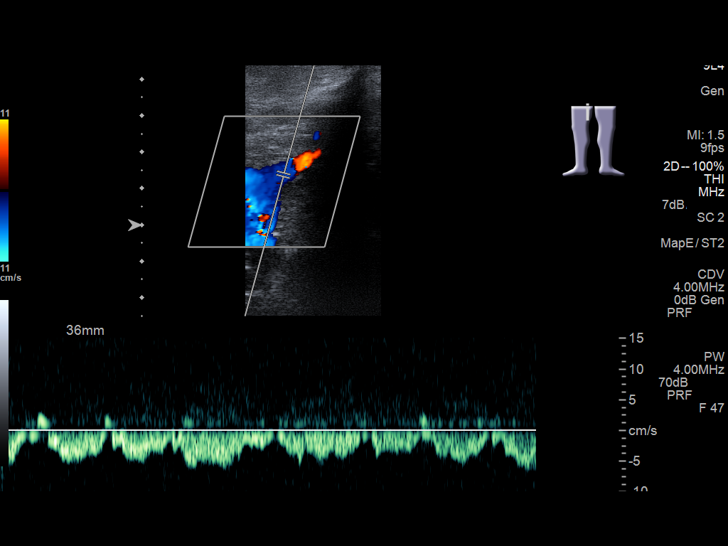
[im 12/27]
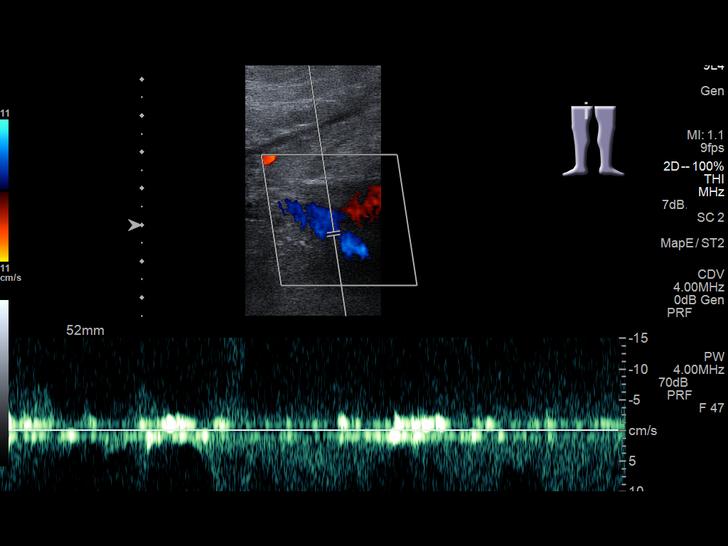
[im 14/27]
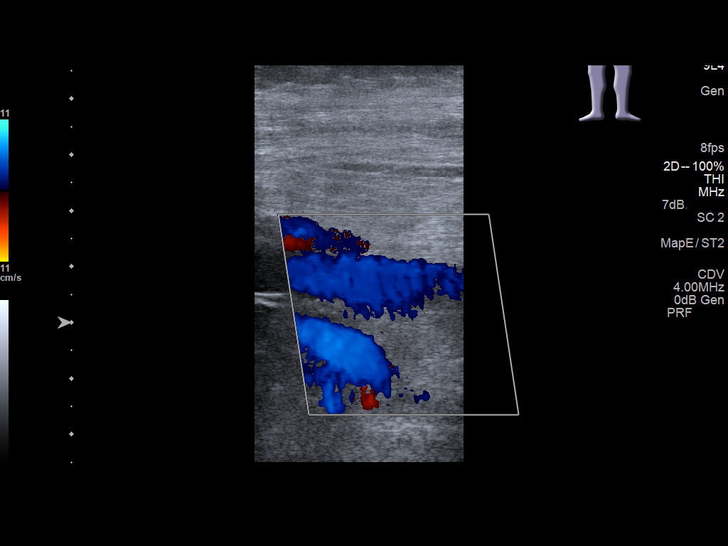
[im 15/27]
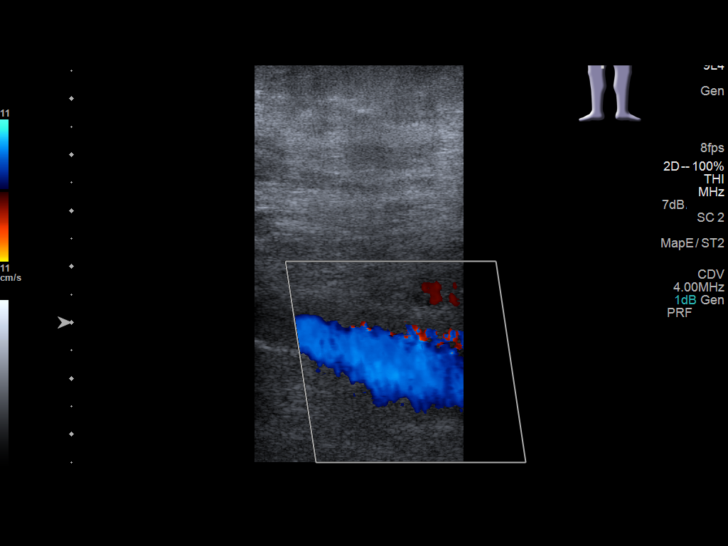
[im 17/27]
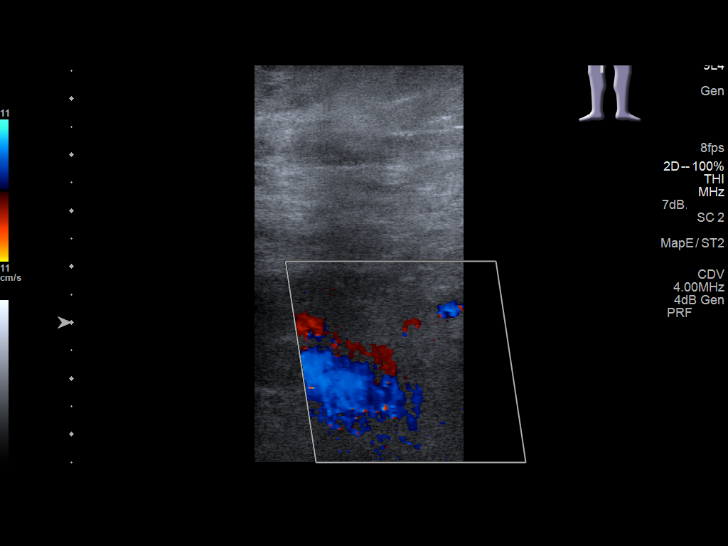
[im 20/27]
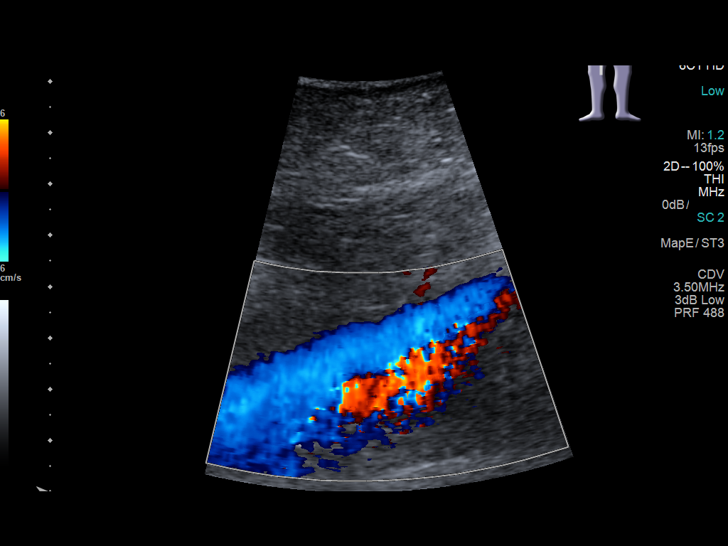
[im 22/27]
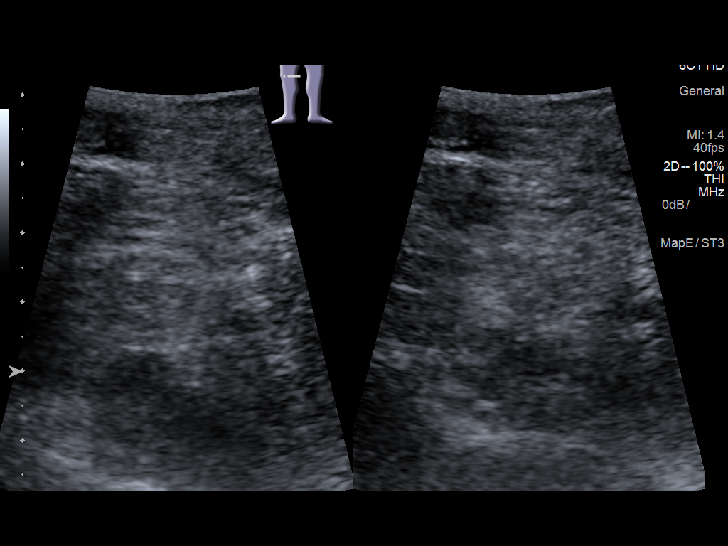
[im 24/27]
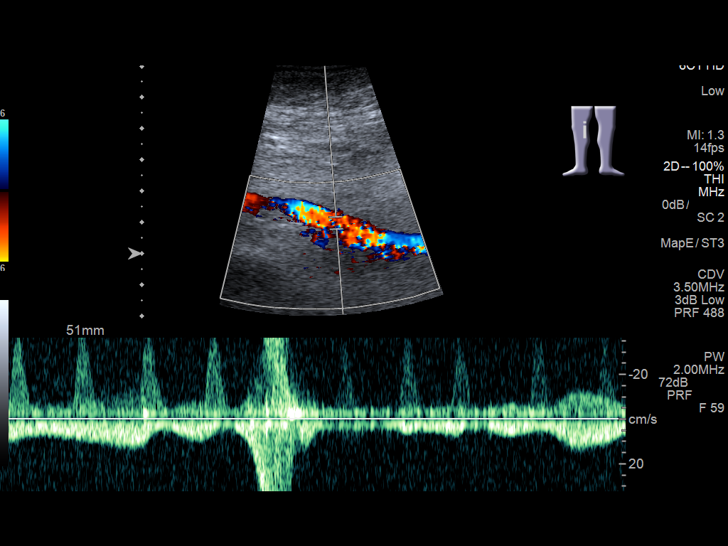
[im 27/27]
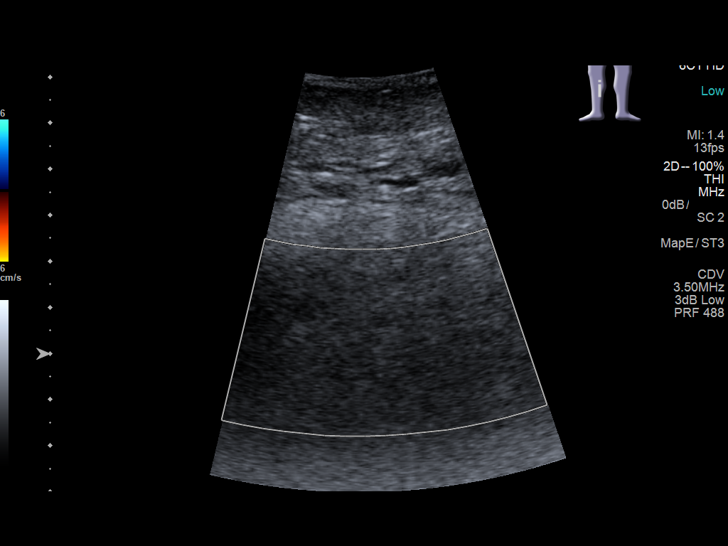

[13 of 24 positions shown; findings below may reference images not displayed]

FINDINGS: Contralateral Common Femoral Vein: Respiratory phasicity is normal
and symmetric with the symptomatic side. No evidence of thrombus.
Normal compressibility.

Common Femoral Vein: No evidence of thrombus. Normal
compressibility, respiratory phasicity and response to augmentation.

Saphenofemoral Junction: No evidence of thrombus. Normal
compressibility and flow on color Doppler imaging.

Profunda Femoral Vein: No evidence of thrombus. Normal
compressibility and flow on color Doppler imaging.

Femoral Vein: No evidence of thrombus. Normal compressibility,
respiratory phasicity and response to augmentation.

Popliteal Vein: No evidence of thrombus. Normal compressibility,
respiratory phasicity and response to augmentation.

Calf Veins: Not well visualized due to subcutaneous edema.

Other Findings:  None.
IMPRESSION: No evidence of DVT within right lower extremity.
